# Patient Record
Sex: Female | Born: 1935 | Race: White | Hispanic: No | Smoking: Never smoker
Health system: Southern US, Community
[De-identification: ages and names within clinical notes are randomized; demographics above are authoritative.]

## PROBLEM LIST (undated history)

## (undated) DIAGNOSIS — Z9889 Other specified postprocedural states: Secondary | ICD-10-CM

## (undated) DIAGNOSIS — F32A Depression, unspecified: Secondary | ICD-10-CM

## (undated) DIAGNOSIS — K589 Irritable bowel syndrome without diarrhea: Secondary | ICD-10-CM

## (undated) DIAGNOSIS — F329 Major depressive disorder, single episode, unspecified: Secondary | ICD-10-CM

## (undated) DIAGNOSIS — Z8673 Personal history of transient ischemic attack (TIA), and cerebral infarction without residual deficits: Secondary | ICD-10-CM

## (undated) DIAGNOSIS — Q211 Atrial septal defect, unspecified: Secondary | ICD-10-CM

## (undated) DIAGNOSIS — K219 Gastro-esophageal reflux disease without esophagitis: Secondary | ICD-10-CM

## (undated) DIAGNOSIS — Z923 Personal history of irradiation: Secondary | ICD-10-CM

## (undated) DIAGNOSIS — C801 Malignant (primary) neoplasm, unspecified: Secondary | ICD-10-CM

## (undated) DIAGNOSIS — H409 Unspecified glaucoma: Secondary | ICD-10-CM

## (undated) DIAGNOSIS — R269 Unspecified abnormalities of gait and mobility: Secondary | ICD-10-CM

## (undated) DIAGNOSIS — I10 Essential (primary) hypertension: Secondary | ICD-10-CM

## (undated) DIAGNOSIS — G5603 Carpal tunnel syndrome, bilateral upper limbs: Secondary | ICD-10-CM

## (undated) HISTORY — DX: Atrial septal defect, unspecified: Q21.10

## (undated) HISTORY — DX: Unspecified abnormalities of gait and mobility: R26.9

## (undated) HISTORY — DX: Other specified postprocedural states: Z98.890

## (undated) HISTORY — DX: Carpal tunnel syndrome, bilateral upper limbs: G56.03

## (undated) HISTORY — DX: Unspecified glaucoma: H40.9

## (undated) HISTORY — PX: CATARACT EXTRACTION: SUR2

## (undated) HISTORY — DX: Personal history of transient ischemic attack (TIA), and cerebral infarction without residual deficits: Z86.73

## (undated) HISTORY — DX: Gastro-esophageal reflux disease without esophagitis: K21.9

## (undated) HISTORY — PX: ABDOMINAL HYSTERECTOMY: SUR658

## (undated) HISTORY — PX: TOTAL KNEE ARTHROPLASTY: SHX125

## (undated) HISTORY — DX: Irritable bowel syndrome, unspecified: K58.9

## (undated) HISTORY — PX: OTHER SURGICAL HISTORY: SHX169

## (undated) HISTORY — PX: BREAST BIOPSY: SHX20

## (undated) HISTORY — PX: TOTAL HIP ARTHROPLASTY: SHX124

## (undated) HISTORY — DX: Atrial septal defect: Q21.1

---

## 1994-07-09 DIAGNOSIS — Z923 Personal history of irradiation: Secondary | ICD-10-CM

## 1994-07-09 HISTORY — PX: BREAST LUMPECTOMY: SHX2

## 1994-07-09 HISTORY — DX: Personal history of irradiation: Z92.3

## 1999-11-13 ENCOUNTER — Encounter: Payer: Self-pay | Admitting: Oncology

## 1999-11-13 ENCOUNTER — Encounter: Admission: RE | Admit: 1999-11-13 | Discharge: 1999-11-13 | Payer: Self-pay | Admitting: Oncology

## 1999-11-28 ENCOUNTER — Emergency Department (HOSPITAL_COMMUNITY): Admission: EM | Admit: 1999-11-28 | Discharge: 1999-11-28 | Payer: Self-pay

## 2000-11-13 ENCOUNTER — Encounter: Payer: Self-pay | Admitting: General Surgery

## 2000-11-13 ENCOUNTER — Encounter: Admission: RE | Admit: 2000-11-13 | Discharge: 2000-11-13 | Payer: Self-pay | Admitting: General Surgery

## 2000-12-15 ENCOUNTER — Emergency Department (HOSPITAL_COMMUNITY): Admission: EM | Admit: 2000-12-15 | Discharge: 2000-12-15 | Payer: Self-pay | Admitting: *Deleted

## 2000-12-15 ENCOUNTER — Encounter: Payer: Self-pay | Admitting: *Deleted

## 2001-03-24 ENCOUNTER — Encounter: Payer: Self-pay | Admitting: Orthopedic Surgery

## 2001-03-24 ENCOUNTER — Encounter: Admission: RE | Admit: 2001-03-24 | Discharge: 2001-03-24 | Payer: Self-pay | Admitting: Orthopedic Surgery

## 2001-12-02 ENCOUNTER — Encounter: Admission: RE | Admit: 2001-12-02 | Discharge: 2001-12-02 | Payer: Self-pay | Admitting: Family Medicine

## 2001-12-02 ENCOUNTER — Encounter: Payer: Self-pay | Admitting: Family Medicine

## 2002-08-14 ENCOUNTER — Encounter: Payer: Self-pay | Admitting: Orthopedic Surgery

## 2002-08-17 ENCOUNTER — Inpatient Hospital Stay (HOSPITAL_COMMUNITY): Admission: RE | Admit: 2002-08-17 | Discharge: 2002-08-24 | Payer: Self-pay | Admitting: Neurology

## 2002-08-18 ENCOUNTER — Encounter: Payer: Self-pay | Admitting: Orthopedic Surgery

## 2002-08-19 ENCOUNTER — Encounter: Payer: Self-pay | Admitting: Cardiovascular Disease

## 2002-08-20 ENCOUNTER — Encounter: Payer: Self-pay | Admitting: Orthopedic Surgery

## 2002-08-24 ENCOUNTER — Inpatient Hospital Stay (HOSPITAL_COMMUNITY)
Admission: RE | Admit: 2002-08-24 | Discharge: 2002-08-29 | Payer: Self-pay | Admitting: Physical Medicine & Rehabilitation

## 2002-08-24 ENCOUNTER — Encounter (INDEPENDENT_AMBULATORY_CARE_PROVIDER_SITE_OTHER): Payer: Self-pay | Admitting: Cardiology

## 2002-10-14 ENCOUNTER — Ambulatory Visit (HOSPITAL_COMMUNITY): Admission: RE | Admit: 2002-10-14 | Discharge: 2002-10-14 | Payer: Self-pay | Admitting: Gastroenterology

## 2002-12-15 ENCOUNTER — Encounter: Payer: Self-pay | Admitting: Family Medicine

## 2002-12-15 ENCOUNTER — Encounter: Admission: RE | Admit: 2002-12-15 | Discharge: 2002-12-15 | Payer: Self-pay | Admitting: Family Medicine

## 2003-07-21 ENCOUNTER — Other Ambulatory Visit: Admission: RE | Admit: 2003-07-21 | Discharge: 2003-07-21 | Payer: Self-pay | Admitting: Obstetrics and Gynecology

## 2003-12-27 ENCOUNTER — Encounter: Admission: RE | Admit: 2003-12-27 | Discharge: 2003-12-27 | Payer: Self-pay | Admitting: Oncology

## 2005-01-02 ENCOUNTER — Ambulatory Visit (HOSPITAL_COMMUNITY): Admission: RE | Admit: 2005-01-02 | Discharge: 2005-01-02 | Payer: Self-pay | Admitting: Gastroenterology

## 2005-01-08 ENCOUNTER — Encounter: Admission: RE | Admit: 2005-01-08 | Discharge: 2005-01-08 | Payer: Self-pay | Admitting: General Surgery

## 2005-04-17 ENCOUNTER — Ambulatory Visit: Payer: Self-pay | Admitting: Oncology

## 2005-05-17 ENCOUNTER — Observation Stay (HOSPITAL_COMMUNITY): Admission: EM | Admit: 2005-05-17 | Discharge: 2005-05-18 | Payer: Self-pay | Admitting: Emergency Medicine

## 2005-07-16 ENCOUNTER — Encounter: Admission: RE | Admit: 2005-07-16 | Discharge: 2005-07-16 | Payer: Self-pay | Admitting: Oncology

## 2005-09-23 ENCOUNTER — Inpatient Hospital Stay (HOSPITAL_COMMUNITY): Admission: EM | Admit: 2005-09-23 | Discharge: 2005-09-25 | Payer: Self-pay | Admitting: Emergency Medicine

## 2005-10-06 ENCOUNTER — Inpatient Hospital Stay (HOSPITAL_COMMUNITY): Admission: RE | Admit: 2005-10-06 | Discharge: 2005-10-11 | Payer: Self-pay | Admitting: Orthopedic Surgery

## 2005-10-13 ENCOUNTER — Emergency Department (HOSPITAL_COMMUNITY): Admission: EM | Admit: 2005-10-13 | Discharge: 2005-10-13 | Payer: Self-pay | Admitting: Emergency Medicine

## 2006-01-11 ENCOUNTER — Encounter: Admission: RE | Admit: 2006-01-11 | Discharge: 2006-01-11 | Payer: Self-pay | Admitting: General Surgery

## 2006-04-15 ENCOUNTER — Ambulatory Visit: Payer: Self-pay | Admitting: Oncology

## 2006-04-17 LAB — URINALYSIS, MICROSCOPIC - CHCC
Bilirubin (Urine): NEGATIVE
Glucose: NEGATIVE g/dL
Ketones: NEGATIVE mg/dL
RBC count: NEGATIVE (ref 0–2)
pH: 6 (ref 4.6–8.0)

## 2007-01-14 ENCOUNTER — Encounter: Admission: RE | Admit: 2007-01-14 | Discharge: 2007-01-14 | Payer: Self-pay | Admitting: General Surgery

## 2007-01-17 ENCOUNTER — Encounter: Admission: RE | Admit: 2007-01-17 | Discharge: 2007-01-17 | Payer: Self-pay | Admitting: General Surgery

## 2007-04-11 ENCOUNTER — Ambulatory Visit: Payer: Self-pay | Admitting: Oncology

## 2007-07-09 ENCOUNTER — Ambulatory Visit: Payer: Self-pay | Admitting: Oncology

## 2008-01-30 ENCOUNTER — Encounter: Admission: RE | Admit: 2008-01-30 | Discharge: 2008-01-30 | Payer: Self-pay | Admitting: Family Medicine

## 2008-02-06 ENCOUNTER — Encounter: Admission: RE | Admit: 2008-02-06 | Discharge: 2008-02-06 | Payer: Self-pay | Admitting: Family Medicine

## 2008-04-20 ENCOUNTER — Encounter: Admission: RE | Admit: 2008-04-20 | Discharge: 2008-04-20 | Payer: Self-pay | Admitting: Family Medicine

## 2008-08-06 ENCOUNTER — Ambulatory Visit: Payer: Self-pay | Admitting: Oncology

## 2008-11-26 ENCOUNTER — Emergency Department (HOSPITAL_COMMUNITY): Admission: EM | Admit: 2008-11-26 | Discharge: 2008-11-26 | Payer: Self-pay | Admitting: Emergency Medicine

## 2008-12-20 ENCOUNTER — Inpatient Hospital Stay (HOSPITAL_COMMUNITY): Admission: EM | Admit: 2008-12-20 | Discharge: 2008-12-22 | Payer: Self-pay | Admitting: Emergency Medicine

## 2009-02-10 ENCOUNTER — Encounter: Admission: RE | Admit: 2009-02-10 | Discharge: 2009-02-10 | Payer: Self-pay | Admitting: Oncology

## 2009-08-12 ENCOUNTER — Ambulatory Visit: Payer: Self-pay | Admitting: Oncology

## 2009-08-16 LAB — CBC WITH DIFFERENTIAL/PLATELET
BASO%: 2.1 % — ABNORMAL HIGH (ref 0.0–2.0)
Eosinophils Absolute: 0.2 10*3/uL (ref 0.0–0.5)
HCT: 45.9 % (ref 34.8–46.6)
LYMPH%: 29.1 % (ref 14.0–49.7)
MCHC: 34 g/dL (ref 31.5–36.0)
MONO#: 0.3 10*3/uL (ref 0.1–0.9)
NEUT%: 60.4 % (ref 38.4–76.8)
Platelets: 224 10*3/uL (ref 145–400)
WBC: 6.3 10*3/uL (ref 3.9–10.3)

## 2009-08-16 LAB — COMPREHENSIVE METABOLIC PANEL
BUN: 16 mg/dL (ref 6–23)
CO2: 22 mEq/L (ref 19–32)
Creatinine, Ser: 0.92 mg/dL (ref 0.40–1.20)
Glucose, Bld: 101 mg/dL — ABNORMAL HIGH (ref 70–99)
Total Bilirubin: 0.8 mg/dL (ref 0.3–1.2)

## 2010-01-06 ENCOUNTER — Ambulatory Visit (HOSPITAL_BASED_OUTPATIENT_CLINIC_OR_DEPARTMENT_OTHER): Admission: RE | Admit: 2010-01-06 | Discharge: 2010-01-07 | Payer: Self-pay | Admitting: Urology

## 2010-02-13 ENCOUNTER — Encounter: Admission: RE | Admit: 2010-02-13 | Discharge: 2010-02-13 | Payer: Self-pay | Admitting: General Surgery

## 2010-07-30 ENCOUNTER — Encounter: Payer: Self-pay | Admitting: Urology

## 2010-07-31 ENCOUNTER — Encounter: Payer: Self-pay | Admitting: General Surgery

## 2010-07-31 ENCOUNTER — Encounter: Payer: Self-pay | Admitting: Family Medicine

## 2010-08-16 ENCOUNTER — Other Ambulatory Visit: Payer: Self-pay | Admitting: Oncology

## 2010-08-16 ENCOUNTER — Encounter (HOSPITAL_BASED_OUTPATIENT_CLINIC_OR_DEPARTMENT_OTHER): Payer: Medicare Other | Admitting: Oncology

## 2010-08-16 DIAGNOSIS — Z1231 Encounter for screening mammogram for malignant neoplasm of breast: Secondary | ICD-10-CM

## 2010-08-16 DIAGNOSIS — Z853 Personal history of malignant neoplasm of breast: Secondary | ICD-10-CM

## 2010-08-16 DIAGNOSIS — Z9889 Other specified postprocedural states: Secondary | ICD-10-CM

## 2010-08-16 LAB — CBC WITH DIFFERENTIAL/PLATELET
Eosinophils Absolute: 0.1 10*3/uL (ref 0.0–0.5)
HCT: 43.4 % (ref 34.8–46.6)
HGB: 14.7 g/dL (ref 11.6–15.9)
LYMPH%: 29.9 % (ref 14.0–49.7)
MONO#: 0.3 10*3/uL (ref 0.1–0.9)
NEUT#: 3.4 10*3/uL (ref 1.5–6.5)
Platelets: 197 10*3/uL (ref 145–400)
RBC: 4.93 10*6/uL (ref 3.70–5.45)
WBC: 5.6 10*3/uL (ref 3.9–10.3)

## 2010-09-24 LAB — BASIC METABOLIC PANEL
CO2: 24 mEq/L (ref 19–32)
Chloride: 112 mEq/L (ref 96–112)
GFR calc Af Amer: >60 mL/min (ref >60)
Potassium: 4.3 mEq/L (ref 3.5–5.1)
Sodium: 142 mEq/L (ref 135–145)

## 2010-09-24 LAB — PROTIME-INR: INR: 1.02 (ref 0.00–1.49)

## 2010-09-24 LAB — POCT HEMOGLOBIN-HEMACUE: Hemoglobin: 15.5 g/dL — ABNORMAL HIGH (ref 12.0–15.0)

## 2010-10-16 LAB — URINALYSIS, ROUTINE W REFLEX MICROSCOPIC
Nitrite: NEGATIVE
Specific Gravity, Urine: 1.025 (ref 1.005–1.030)
Urobilinogen, UA: 1 mg/dL (ref 0.0–1.0)
pH: 6 (ref 5.0–8.0)

## 2010-10-16 LAB — CULTURE, BLOOD (ROUTINE X 2): Culture: NO GROWTH

## 2010-10-16 LAB — DIFFERENTIAL
Basophils Absolute: 0.1 10*3/uL (ref 0.0–0.1)
Basophils Relative: 0 % (ref 0–1)
Eosinophils Relative: 1 % (ref 0–5)
Lymphocytes Relative: 12 % (ref 12–46)
Lymphs Abs: 1.2 10*3/uL (ref 0.7–4.0)
Monocytes Relative: 2 % — ABNORMAL LOW (ref 3–12)
Neutro Abs: 10.3 10*3/uL — ABNORMAL HIGH (ref 1.7–7.7)
Neutro Abs: 8 10*3/uL — ABNORMAL HIGH (ref 1.7–7.7)
Neutrophils Relative %: 80 % — ABNORMAL HIGH (ref 43–77)
Neutrophils Relative %: 88 % — ABNORMAL HIGH (ref 43–77)

## 2010-10-16 LAB — PROTIME-INR
INR: 1.9 — ABNORMAL HIGH (ref 0.00–1.49)
INR: 3.3 — ABNORMAL HIGH (ref 0.00–1.49)
INR: 4.2 — ABNORMAL HIGH (ref 0.00–1.49)
INR: 5.1 (ref 0.00–1.49)
Prothrombin Time: 35.9 seconds — ABNORMAL HIGH (ref 11.6–15.2)
Prothrombin Time: 44.5 seconds — ABNORMAL HIGH (ref 11.6–15.2)
Prothrombin Time: 52.3 seconds — ABNORMAL HIGH (ref 11.6–15.2)

## 2010-10-16 LAB — BASIC METABOLIC PANEL
BUN: 15 mg/dL (ref 6–23)
CO2: 24 mEq/L (ref 19–32)
Calcium: 8.3 mg/dL — ABNORMAL LOW (ref 8.4–10.5)
Creatinine, Ser: 0.88 mg/dL (ref 0.4–1.2)
GFR calc Af Amer: >60 mL/min (ref >60)

## 2010-10-16 LAB — URINE CULTURE

## 2010-10-16 LAB — CBC
HCT: 29.5 % — ABNORMAL LOW (ref 36.0–46.0)
MCHC: 33.7 g/dL (ref 30.0–36.0)
Platelets: 166 10*3/uL (ref 150–400)
Platelets: 209 10*3/uL (ref 150–400)
RBC: 3.55 MIL/uL — ABNORMAL LOW (ref 3.87–5.11)
WBC: 10.1 10*3/uL (ref 4.0–10.5)
WBC: 11.7 10*3/uL — ABNORMAL HIGH (ref 4.0–10.5)

## 2010-10-16 LAB — URINE MICROSCOPIC-ADD ON

## 2010-11-21 NOTE — H&P (Signed)
NAME:  Renee Adams, Renee Adams               ACCOUNT NO.:  [id number]   MEDICAL RECORD NO.:  [id number]          PATIENT TYPE:  OBV   LOCATION:  1857                         FACILITY:  [facility]   PHYSICIAN:  Kela Millin, M.D.DATE OF BIRTH:  February 12, 1934   DATE OF ADMISSION:  12/19/2008  DATE OF DISCHARGE:                              HISTORY & PHYSICAL   PRIMARY CARE PHYSICIAN:  L. Lupe Carney, M.D.   CHIEF COMPLAINT:  Worsening right thigh pain.   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  She states that she fell 3 days ago and the next day went  to Dr. Tobin Chad office and had some x-rays done and  was told that she  had no fractures.  She was advised to take Tylenol for pain as needed.  Since then she has had worsening pain in her right thigh mostly  posteriorly (she fell backwards on Thursday).  She has also noted  swelling in her upper thigh posteriorly.  She denies fevers, generalized  weakness, dizziness, shortness of breath, diarrhea, melena, and no  hematochezia.   She was seen in the ED and x-rays of that right leg showed right hip and  knee arthroplasties without fracture or other apparent complication.  Her INR was noted to be elevated at 5.1.  Her hemoglobin 10.6 with a  hematocrit of 31.5.  She is admitted for pain control and further  management.   The patient denies syncope, also no dizziness.   PAST MEDICAL HISTORY:  1. As above.  2. History of a left breast cancer in 1996.  3. History of left internal carotid artery stenosis, 60-80%.   MEDICATIONS:  1. Lipitor 10 mg daily.  2. Wellbutrin.  3. Coumadin.  4. Tylenol p.r.n.  5. Phenergan p.r.n.  6. Requip.  7. Xalatan.  8. Acular.   The patient does not know the doses of most of her medications.   ALLERGIES:  DILAUDID and MORPHINE slow down her respirations.  KEFLEX.   SOCIAL HISTORY:  She denies tobacco, also denies alcohol.   FAMILY HISTORY:  Reviewed and noncontributory to current illness.   REVIEW OF SYSTEMS:  As per HPI; other review of systems negative.   PHYSICAL EXAMINATION:  GENERAL:  The patient is a pleasant, elderly,  white female, in no apparent distress.  VITAL SIGNS:  Her temperature is 98.6 and a blood pressure 122/53, pulse  99, respiratory rate of 18, oxygen saturation of 93%.  HEENT:  PERRL, EOMI, moist mucous membranes and no oral exudates.  NECK:  Supple, no adenopathy, no thyromegaly, and no JVD.  LUNGS:  Clear to auscultation bilaterally.  No crackles or wheezes.  CARDIOVASCULAR:  Regular rate and rhythm.  Normal S1 and S2.  ABDOMEN:  Soft.  Bowel sounds present, nontender, nondistended.  No  organomegaly and no  masses palpable.  EXTREMITIES:  She has a large ecchymotic area from her right upper thigh  posteriorly, up toward her right buttock, edematous.  Her left lower  extremity within normal limits.  No cyanosis and no edema.  NEURO:  She is alert and oriented x3.  Cranial nerves II-XII grossly  intact, nonfocal exam.   LABORATORY DATA:  As per HPI and also her white cell count is 11.7,  hemoglobin 10.6, hematocrit 31.5.  INR is 5.1.  Sodium 136 with a  potassium of 4.1, chloride 107, CO2 of 24, glucose 117, BUN of 15,  creatinine 0.88, calcium 8.3.   ASSESSMENT AND PLAN:  1. Large right thigh hematoma/ecchymosis, status post fall with      supratherapeutic INR as above.  Will hold Coumadin.  Pain control.      Follow.  2. Supratherapeutic INR:  As discussed above.  The ED physician      discussed the patient with [facility] cardiology and they recommend for      her Coumadin to be restarted once her INR decreases.  Will monitor      INR and  further manage accordingly.  3. History of atrial septal defect with history of strokes after      surgery, on chronic Coumadin.  INR supratherapeutic.  Management as      above.  4. History of osteoarthritis of hips and knees, status post surgeries.  5. History of anxiety/depression.  Continue outpatient medications.  6. Mild leukocytosis.  Obtain a urinalysis and follow.  7. History of hypercholesterolemia.  Continue Lipitor.  8. Status post fall.  Consult physical therapy and occupational      therapy and follow.      Kela Millin, M.D.  Electronically Signed     Kela Millin, M.D.  Electronically Signed    ACV/MEDQ  D:  12/19/2008  T:  12/19/2008  Job:  [id number]   cc:   L. Lupe Carney, M.D.

## 2010-11-24 NOTE — Consult Note (Signed)
NAME:  Renee Adams, Renee Adams NO.:  [id number]   MEDICAL RECORD NO.:  [id number]          PATIENT TYPE:  INP   LOCATION:  5727                         FACILITY:  [facility]   PHYSICIAN:  Melvyn Novas, M.D.  DATE OF BIRTH:  [birth year]   DATE OF CONSULTATION:  09/25/2005  DATE OF DISCHARGE:  09/25/2005                                   CONSULTATION   [name] is a 77 year old Caucasian right-handed female who states that  she had suffered two strokes in the past, one in 1997 leaving her with a  very mild residual right-sided hand numbness and a second stroke in 2004  during which she suffered right hemiparesis from which she has recovered  greatly. Coumadin was started after the patient was found to have an ASD as  a risk factor. She states that she does not have atrial fibrillation and no  clotting disorders to her knowledge. The patient is followed for her  Coumadin levels by Dr. Meade Maw at [facility].   The patient recently fell and was admitted to Dr. Tobin Chad orthopedic  surgical service after a dislocation of the left hip. She was now treated  with a closed hip reduction and is currently using a walker and a hip brace.  The patient is expected to undergo a total hip replacement on the left  within the next 14 days and Dr. [name] wanted neurologic input to see how to  prevent any new strokes in the future since she will have to get off  Coumadin for the period of time.   The patient's review of systems is without any additional deficits. She  states she has no evidence of any infection. She has some hip pain and is  normally not using a walker as she is now. She denies any other neurologic  deficits, has never had recurrence of right-sided weakness or facial droop  since 2004. No respiratory ailments. No gastrointestinal or urinary  problems.   The patient's vital signs are stable. She is febrile. She is able to walk  with a walker after her closed hip  reduction. She is alert and oriented x3.  Shows good memory and fluent conversation. Cranial nerve examination shows a  mild right-sided facial droop seen in her nasal labial fold being decreased  on the right but her forehead moves equally. The patient also has no visual  field deficits and no facial sensory loss. She has full range of motion for  her neck. Tongue and uvula move in the midline. There is a slight asymmetry  as her  uvula does not lift as much on the right as on the left , but she  denies any problems with swallowing or speech.   Motor examination shows 4+/5 strength throughout the right side. There is a  mild decrease in her grip strength. There is a mild right-sided pronator  drift. Deep tendon reflexes are a little bit brisker right over left but  very subtly so. The left neck strength was impaired due to the hip brace and  the pain was related to the recent closed  reduction. She had no primary  modality loss to touch. Coordination and finger-nose test (we did not  perform heel-to-shin test). The patient denies any problems with  coordination and shows no ataxia or dysmetria on finger-nose.   Gait, again the patient is using a walker which is not her normal baseline.   PAST MEDICAL HISTORY:  CVA, ASD, depression, spastic colon and recent hip  dislocation.   MEDICATIONS:  Coumadin, Wellbutrin 150 milligrams q.a.m., Lipitor 10  milligrams a day, Prilosec p.r.n. The patient's dose of Coumadin was not  noted in the chart.   SOCIAL HISTORY:  The patient has adult healthy children. Denies smoking. No  alcohol or drug use. She is widowed.   PHYSICAL EXAMINATION:  Performed by the a primary service shows a general  normal exam. Supple neck. No goiter. No lymph node swelling. Lungs clear to  auscultation. Heart examination shows a regular rate and rhythm. A murmur  was not noted neither a carotid bruit. Extremities at the time of her  admission show left-sided leg  shortening.   ASSESSMENT:  This patient has multiple risk factors for stroke and has been  placed on Coumadin to prevent recurrence. Since she will need full hip  replacement surgery, I would recommend for this patient to be converted to  heparin IV while her Coumadin is held. I suggest that the Coumadin should be  held about five days prior to her surgery until return to a normal INR and  of course a vitamin K could also be used to shorten this period of time. I  would like her to be 48 or 72 hours prior to her surgery on IV heparin and  not on Lovenox which requires hospitalization. This way the heparin affect  can be quickly reversed and heparin can also be easily restarted after  surgery.      Melvyn Novas, M.D.  Electronically Signed     CD/MEDQ  D:  09/25/2005  T:  09/26/2005  Job:  [id number]   cc:   Meade Maw, M.D.  Fax: [phone]   Mila Homer. [name], M.D.  Fax: [phone]   L. Lupe Carney, M.D.  Fax: [phone]   Genene Churn. Love, M.D.  Fax: [phone]

## 2010-11-24 NOTE — Consult Note (Signed)
NAME:  Renee Adams, Renee Adams               ACCOUNT NO.:  [id number]   MEDICAL RECORD NO.:  [id number]          PATIENT TYPE:  INP   LOCATION:  1825                         FACILITY:  [facility]   PHYSICIAN:  Hollice Espy, M.D.DATE OF BIRTH:  [birth year]   DATE OF CONSULTATION:  DATE OF DISCHARGE:                                   CONSULTATION   PRIMARY CARE PHYSICIAN:  L. Lupe Carney, M.D.   REASON FOR CONSULTATION:  Evaluation of fever.   HISTORY OF PRESENT ILLNESS:  The patient is a 77 year old white female with  a past medical history of CVA, ASD, and recent hip dislocation, status post  revision of left total hip who was discharged from Dr. [name], orthopedic  surgery service, on October 11, 2005.  Since that time, she has been in [facility] receiving rehab, and started having issues with elevated  temperatures.  She had two elevated temperature spikes as high as 101 on two  separate days, and the patient, as per instructions by Dr. [name], was  brought into the emergency room for further evaluation.  On admission to the  emergency room, the patient was noted in the emergency room to have lower  grade temperatures of 100.5.  Blood pressures had been within normal limits,  although initial blood pressure reported a systolic of 68, with no  diastolic;  however, further blood pressures were within the normal range in  the 100s.  Laboratory work was done, including a chest x-ray, which showed  no acute cardiopulmonary disease, no evidence of any atelectasis, and a  completely normal urinalysis, and most notable was a normal white count with  normal neutrophils and absolute granulocyte count.  The patient had blood  cultures drawn.  The rest of her laboratory work is essentially  unremarkable.  Plan was by orthopedic surgery for a brief admission for this  patient for interventional radiology to do an aspiration of the left hip and  send fluid for cultures.  The patient herself is  doing okay.  Her only  complaint is some discomfort from having to lay on her back and having  feeling some pressure sore on her buttocks;  however, she actually does not  have any pressure sores.  In addition, her wound was examined, and no  evidence of pus, drainage, surrounding erythema was noted as well.  The  wound looks well-healed with staples still intact.  The patient otherwise is  doing well.  She denies any headaches, vision changes, dysphagia, chest  pain, palpitations, shortness of breath, wheeze, cough, abdominal pain,  hematuria, dysuria, constipation, diarrhea, focal extremity numbness or  weakness other than the afore mentioned left hip.  Review of systems  otherwise negative.   PAST MEDICAL HISTORY:  1.  History of ASD.  2.  History of CVA.  3.  History of recent revision of left hip secondary to dislocation.  4.  History of breast CA on the left side.   MEDICATIONS:  Based on the patient's previous discharge summary, are:  1.  Wellbutrin 150 mg p.o. daily.  2.  Zocor 20  mg p.o. q.h.s.  3.  Coumadin.  4.  Colace 100 mg p.o. b.i.d.  5.  OxyContin 10 mg SR p.o. q.12h.  6.  Requip 0.5 mg p.o. q.h.s. nightly.  7.  Percocet p.r.n.  8.  Ambien p.r.n.  9.  Tylenol p.r.n.  10. Phenergan p.r.n.  11. Senokot p.r.n.  12. Fleets p.r.n.   ALLERGIES:  The patient has allergies to KEFLEX, DILAUDID, and MORPHINE,  although she seems to tolerate other oral narcotics fine.   SOCIAL HISTORY:  The patient denies any tobacco, alcohol, or drug use.  Currently, she is at [facility] receiving rehab.   FAMILY HISTORY:  Breast CA, CAD, and CHF.   PHYSICAL EXAMINATION:  VITAL SIGNS:  Temperature 100.5, now it is down to  97.2.  Heart rate 99, now 88.  Blood pressure about 102/59, respirations 20,  O2 saturation 98% on room air.  GENERAL:  The patient is alert and oriented x3.  In no apparent distress.  HEENT:  Normocephalic, atraumatic.  Mucous membranes are moist.   HEART:  Regular rate and rhythm, S1 and S2.  LUNGS:  Clear to auscultation bilaterally.  ABDOMEN:  Soft, nontender, nondistended, positive bowel sounds.  EXTREMITIES:  No clubbing or cyanosis, trace pitting edema.  The incision on  her left hip looks like clean, dry, and intact with staples.   LABORATORY DATA:  Chest x-ray shows no acute cardiopulmonary disease.  Urinalysis negative.  White count 8.1, no shift.  H&H 9.8 and 28.4.  MCV of  85, platelet count of 269.  Blood cultures drawn and pending.  Orthopedic  surgery has ordered a PT, PTT, INR, and sedimentation rate.   ASSESSMENT AND PLAN:  Fever.  I neglected to mention also that the patient  had a Doppler done at [facility] which was negative for deep vein thrombosis,  although we are awaiting for a therapeutic INR.  If she has a negative deep  vein thrombosis, then I would suspect that the most likely reason for her  fever is still post-op.  Her surgery was done on October 08, 2005, and although  it has been five days, I would still at this time attribute it to that.  She  has no evidence of any wound infection.  No evidence of any other source of  infection given her normal white count and normal shift.  To prevent,  however, any type of future complications, I recommend the patient to be  discharged on incentive spirometer.  She received incentive spirometer  during her hospitalization;  however, she has not had one since she was over  at [facility].  In addition, she also has not yet been fully  ambulatory, spends most time on her back, and I would recommend frequent  turning of patient with pillows to prevent any kind of pressure sore ulcers.  Otherwise, would continue as per orthopedic surgery.   Thank you very much for allowing [place] to participate in the care of this  patient.  Please notify if we can be of any further assistance.      Hollice Espy, M.D.  Electronically Signed     SKK/MEDQ  D:  10/13/2005  T:   10/13/2005  Job:  [id number]   cc:   Jonny Ruiz L. Rendall, M.D.  Fax: [phone]   L. Lupe Carney, M.D.  Fax: [phone]

## 2010-11-24 NOTE — Op Note (Signed)
NAME:  Renee Adams, Renee Adams               ACCOUNT NO.:  [id number]   MEDICAL RECORD NO.:  [id number]          PATIENT TYPE:  OBV   LOCATION:  1506                         [facility]AC[facility]   PHYSICIAN:  John L. Rendall, M.D.  DATE OF BIRTH:  [birth year]   DATE OF PROCEDURE:  05/17/2005  DATE OF DISCHARGE:                                 OPERATIVE REPORT   PREOPERATIVE DIAGNOSIS:  Posterior dislocation, left total hip.   POSTOPERATIVE DIAGNOSIS:  Posterior dislocation, left total hip.   SURGICAL PROCEDURES:  Closed manipulation and reduction under anesthesia.   SURGEON:  John L. Rendall, M.D.   ANESTHESIA:  General.   PROCEDURE:  Under brief general anesthetic with muscle relaxation, the left  hip was brought into flexion. A pull straight upward was done and even with  this applied, the hip did not reduce. It was then brought into extension,  the leg was still 2 inches short. The pelvic strap was then attached across  the symphysis and manipulation was done again with internal rotation  although I did not feel it actually jumped back in place. During the next  wiggle, the hip did go back in place. The hip was brought out into  extension, leg lengths were equal. Then using C-arm fluoro, the hip was  flexed up to 45 degrees, abducted, internal and external rotated and was  stable through all these motion. A knee immobilizer was then applied. The  patient was transferred to her regular bed and brought to recovery. Total  procedure time less than 5 minutes.      John L. Rendall, M.D.  Electronically Signed     JLR/MEDQ  D:  05/17/2005  T:  05/18/2005  Job:  [id number]

## 2010-11-24 NOTE — Discharge Summary (Signed)
[name], [name]               ACCOUNT NO.:  [id number]   MEDICAL RECORD NO.:  [id number]          PATIENT TYPE:  INP   LOCATION:  5034                         FACILITY:  [facility]   PHYSICIAN:  Mila Homer. [name], M.D. DATE OF BIRTH:  Mar 04, 1934   DATE OF ADMISSION:  10/06/2005  DATE OF DISCHARGE:  10/11/2005                           DISCHARGE SUMMARY - REFERRING   ADMISSION DIAGNOSES:  1.  Recurring dislocation left total hip replacement.  2.  Atrial septal defect with history of strokes after surgery.  3.  Depression.  4.  Osteoarthritis bilateral hips and knees.  5.  History of breast cancer, left side.  6.  Chronic Coumadin for atrial septal defect with history of stroke after      surgery.   DISCHARGE DIAGNOSES:  1.  Status post revision left total hip.  2.  Urinary retention, treated and resolved.  3.  Mild hypotension, preoperative and postoperative asymptomatic.  4.  Acute blood loss anemia secondary to surgery without symptoms.  5.  History of atrial septal defect with history of stroke after surgery, on      chronic Coumadin.  6.  Depression.  7.  Osteoarthritis, bilateral hips and knees.  8.  History of breast cancer left side.   HISTORY OF PRESENT ILLNESS:  Renee Adams is a 77 year old white female who  has history of left total hip replacement done by Dr. Paul Dykes. Renee Adams, July  1997.  Since Dr. Fannie Adams has retired, Dr. [name] has assumed patient's orthopedic  care and has actually undergone a Adams replacement in the past by Dr. [name].  Left hip was doing well until November of 2006, when she was bending down  and bringing her legs up to put her socks on and her left hip dislocated.  Patient was brought to the ER and underwent a closed reduction in the OR.  Patient was treated with a Adams immobilizer, just monitoring her activity.  She did do well until September 23, 2005, when she was sitting and twisted and  dislocated her hip.  Patient now is able to dislocate and relocate  her hip  easily.  Patient was placed in abduction brace and now is being admitted for  revision of left hip replacement.  Due to patient's chronic Coumadin  therapy, she was admitted on October 06, 2005, two days prior to surgery to  begin IV heparin.   ALLERGIES:  1.  MARCAINE causes respiratory distress.  2.  DILAUDID causes respiratory distress and paranoia.  3.  KEFLEX causes nausea and diarrhea.   MEDICATIONS ON ADMISSION:  1.  Wellbutrin XL 150 mg one tablet p.o. q.a.m.  2.  Coumadin 2 mg one tablet q.a.m., last dose October 02, 2005.  3.  Lipitor 10 mg one tablet p.o. q.a.m.  4.  Requip 0.5 mg one tablet p.o. nightly p.r.n. leg cramps.   PROCEDURE:  Patient was taken to the operating room on October 08, 2005, by  Dr. Mila Homer. Lucey, assisted by Oris Drone Petrarca, P.A.-C.  Patient was  placed under general anesthesia and then underwent a left hip revision  arthroplasty  with poly exchange and new femoral head.  Patient tolerated the  procedure well and returned to recovery in good stable condition.   CONSULTATIONS:  The following consults were obtained and patient was  hospitalized.  1.  PT.  2.  OT.  3.  Neurology.  4.  Case management.   HOSPITAL COURSE:  On October 02, 2005, patient was admitted.  Prior to  upcoming left total hip revision, due to the fact she was on chronic  Coumadin, was placed on heparin.   Hospital day #2, patient doing well, no complaints.  No chest pain,  shortness of breath, calf pain.  Heparin was actually started at 5:30 on  October 08, 2005.  Patient was afebrile and vital signs stable.  In the a.m.  it was noted that patient did have some blood pressures with the automatic  blood pressure machine that were 87/54, pulse 85.  When checked manually,  blood pressure was 120/70.  Patient was asymptomatic.   On October 08, 2005, patient was taken to the operating room.  Revision of the  left hip was performed.  INR preoperatively was 1.1, PT was 14.7 and  heparin  level was 0.65.   On postoperative day #1, patient denied any chest pain, nausea, vomiting,  did have some nausea the evening after surgery.  This was resolved at  morning rounds.  Pain under control with p.o. medications.  Patient was  afebrile and vital signs stable.  H&H 11.4 and 34.  Patient with poor  urinary output.  IV fluids were increased to 100 mL per hour.  Patient was  checked on periodically throughout the day.  BUN was 11 and creatinine was  1.   On postoperative day #2, patient was sitting up in the bed at morning  rounds, stated that she was feeling much better.  Patient with still poor  appetite, pain under control.  Patient was afebrile.  Vital signs stable.  H&H 10.6 and 30.8.  PT was 21.1, INR 1.8.  Patient continued heparin.  I&Os  were 1724 in and 1150 out. Foley was discontinued.  Arrangements were made  for patient to go to [facility] in [place] in a.m.   On postoperative day #3, patient with no complaints, voiding well, out of  bed with PT yesterday afternoon, walking the hallway.  Pain under adequate  control.  Patient was afebrile.  Blood pressure on morning rounds 93/54,  patient was asymptomatic, heart rate was 100, respiratory rate 20, 95% on  room air.  H&H was 10.7 and 30.2, stable.  PT was 27.6, INR 2.6.   LABORATORY DATA:  Routine labs on admission.  CBC with white count 7400,  hemoglobin 13.6, hematocrit 39.9, platelets 256. Coags with PT 17.3, high;  INR 1.4, PTT 33.  Heparin level was 0.85 international units per mL, high.  Routine chemistries on admission, all values within normal limits.  Hepatic  enzymes on admission, AST was 32, ALT 16, ALP 89, total bilirubin was  elevated at 1.3.  Urinalysis on admission was negative.  Wound and tissue  cultures obtained on October 08, 2005, from left hip showed no growth.  Anaerobic cultures from left hip October 08, 2005, no growth.  X-rays:  Two views left hip October 08, 2005,  postoperatively showed  satisfactory left total hip replacement.  Two view chest x-ray on October 06, 2005, showed stable chest with bi-apical scarring, no acute findings.   EKG dated October 06, 2005, showed  normal sinus rhythm, heart rate 86 beats  per minute.  PR interval 176, PRT axis 57, __________ 3147.   MEDICATIONS ON FLOOR:  1.  Wellbutrin 150 mg p.o. daily.  2.  Zocor 20 mg p.o. at 1800 hours.  3.  Warfarin per pharmacy protocol.  INR on day of discharge was 2.6.  4.  Colace 100 mg p.o. b.i.d.  5.  OxyContin 10 mg SR p.o. q.12h. at 10 a.m. and 10 p.m.  6.  Requip 0.5 mg nightly p.r.n.  7.  Percocet 5/325 one to two tablets p.o. q.4h. p.r.n. pain.  8.  Ambien 5 to 10 mg p.o. nightly p.r.n. insomnia.  9.  Tylenol 325 to 650 mg p.o. q.4h. p.r.n.  10. Heparin protocol until Coumadin is therapeutic.  11. Phenergan 25 mg p.o. q.6h. p.r.n. nausea.  12. Phenergan 25 mg p.r. q.6h. p.r.n. nausea.  13. Senokot one tablet p.o. nightly p.r.n.  14. Fleets enema 133 mL p.r. daily p.r.n.   DISCHARGE INSTRUCTIONS:  Floor medications will be adjusted by skilled  nursing facility physician.  Patient does need a Coumadin check on October 12, 2005, and Coumadin needs to be adjusted.  Coumadin to be adjusted by  hospital pharmacy prior to discharge.   WOUND CARE:  Patient is to keep wound clean and dry.  Change dressing daily.  Call office at [phone] if any foul smelling drainage or temps greater than  101.5, pains not adequately controlled with pain medication. Patient may  shower after two days if no drainage from wound site.   DIET:  Regular diet.   WEIGHTBEARING STATUS:  Patient is weightbearing as tolerated with walker.  No precautions.  Hip was done from posterior approach.   FOLLOW UP:  Patient needs follow-up with Dr. [name] in the office 14 days  from surgery, date of surgery was October 08, 2005.  Please call office at [phone] for appointment.   SPECIAL INSTRUCTIONS:  Patient  will need PT and OT at skilled nursing  facility and then PT and OT at  home per [facility].      Richardean Canal, P.A.    ______________________________  Mila Homer. [name], M.D.    GC/MEDQ  D:  10/11/2005  T:  10/11/2005  Job:  [id number]

## 2010-11-24 NOTE — Op Note (Signed)
NAME:  Renee Adams, Renee Adams Kitchen                       ACCOUNT NO.:  [id number]   MEDICAL RECORD NO.:  [id number]                   PATIENT TYPE:  INP   LOCATION:                                       FACILITY:  [facility]   PHYSICIAN:  Mila Homer. [name], M.D.              DATE OF BIRTH:  1934/01/17   DATE OF PROCEDURE:  08/17/2002  DATE OF DISCHARGE:                                 OPERATIVE REPORT   PREOPERATIVE DIAGNOSIS:  Left knee osteoarthritis.   POSTOPERATIVE DIAGNOSIS:  Left knee osteoarthritis.   PROCEDURE:  Left total knee arthroplasty.   SURGEON:  Mila Homer. [name], M.D.   ASSISTANT:  Legrand Pitts. Duffy, P.A.   ANESTHESIA:  General anesthesia.   INDICATIONS FOR PROCEDURE:  The patient is an elderly white female who is  status post failure of conservative measures for osteoarthritis of the knee.  Informed consent was obtained.   DESCRIPTION OF PROCEDURE:  The patient was placed supine, administered  general anesthesia and Foley catheter placement.  The left lower extremity  was prepped and draped in the usual sterile fashion.  After a sterile prep  and drape, a midline incision was made with a #10 blade.  A new blade was  used to make a median parapatellar arthrotomy.  At this point, a complete  synovectomy was performed.  I then everted the patella measured at 20 mm  thick and used a 29 mm reamer to ream down to the 11 mm and then used the 29  template sterilized previously removed all excess bone.  I split the  template at trial or will be a  prosthesis at trial and prosthetic trial in  place had also measured 20 mm.  I removed the trial, left the knee cap  inverted and then developed a subperiosteal sleeve off the medial crest of  the tibia, elevating the MCL, placed the bent knee retractor neat to that  and then brought the leg into flexion.  I cut the ACL/PCL and then used the  extramedullary alignment so as to remove a cut surface of tibia articular  surface 2 mm off of the  lateral side.  At this point, I turned attention to  the femur.  I made an intramedullary drill hole.  I then placed the  intramedullary guide set on 4 degree valgus cut, tampered down to the distal  aspect of the femur, placed the distal femoral cutting guide in place and  cut distal femur.  At this point, I did the epicondylar axis.  The posterior  condylar angle measured 5 degrees.  I then used the sizer and sized to a  size D and then pinned to the 5 degree holes.  I then placed the 4-in-1  cutter in place, screwed it into place and made our anterior, posterior and  chamfer cuts.  At this point, I placed the laminar spreader in the lateral  compartment,  removed the medial meniscus, posterior condylar osteophytes,  ACL and PCL.  I then switched the laminar spreader to the medial  compartment, removed the lateral meniscus and posterior lateral condylar  osteophytes.  I then used a 10 mm spacer block and  had excellent flexion  extension gap balance.  I then used the scoop retractor and the femoral  finishing guide and finished the femur cutting the notch and drilling her  leg holes.  I then finished the tibial with size 4 tibial tray drill and  keel.  I then used a size 3 tibial trial, size D femoral trial, size 10 poly  insert trial and size 20 patella trial.  The patella tracked well, dropping  to angles to 130 degrees, stability was excellent.  I then removed the  prosthetic trials and irrigated copiously, cemented the tibia first, femur  second, snapped in the real polyethylene, related the knee, put it in  extension and cemented in the patella.  All excess cement was removed.  After the cement was hard, I let the tourniquet down.  I then irrigated  copiously and cauterized all bleeding vessels.  Tourniquet time was 51  minutes.  I then left the Hemovac deep in the arthrotomy and closed the  arthrotomy with interrupted #1 Vicryls, the soft tissues with interrupted 0  Vicryls,  subcuticular 2-0 Vicryl stitch and skin staples.  Dressed with  Adaptic, 4x4s, sterile Webril and TED stocking.   TOURNIQUET TIME:  Tourniquet time was 50 minutes.   COMPLICATIONS:  There were no complications.   DRAINS:  One Hemovac.   ESTIMATED BLOOD LOSS:  300 cc.                                               Mila Homer. [name], M.D.    SDL/MEDQ  D:  08/17/2002  T:  08/17/2002  Job:  [id number]

## 2010-11-24 NOTE — Op Note (Signed)
   NAME:  Renee Adams, Renee Adams                         ACCOUNT NO.:  [id number]   MEDICAL RECORD NO.:  [id number]                   PATIENT TYPE:  INP   LOCATION:  4727                                 FACILITY:  [facility]   PHYSICIAN:  Meade Maw, M.D.                 DATE OF BIRTH:  08/09/1933   DATE OF PROCEDURE:  DATE OF DISCHARGE:                                 OPERATIVE REPORT   INDICATIONS FOR PROCEDURE:  Evaluation for possible cardiac embolic source.   DESCRIPTION OF PROCEDURE:  After obtaining written informed consent the  patient was brought  to the cardiac endoscopy laboratory in a post  absorptive state. Preoperative sedation was achieved using Cetacaine spray  and viscous lidocaine. The patient was given Versed 4 mg IV and fentanyl 100  micrograms.   Despite multiple attempts I was unable to introduce the OmniPlane  transesophageal echocardiogram probe. Assistance was obtained from Dr. Boyd Kerbs and Dr. Lina Sar. They too were unable to pass the TEE probe.  Brief upper endoscopy by Dr. Lina Sar revealed esophagitis and distal  sphincter strictures.   It was elected to abort the procedure and proceed with external bubble  study. Bubble study was performed. This did reveal significant crossing of  the bubbles from the left side to the right side, suggesting either patent  foramen ovale or atrial septal defect. Further recommendations would be to  proceed with the MRI of the heart when able to do so. Should the patient  have ongoing esophagitis, consideration to GI consultation.   IMPRESSION:  Final impression is positive bubble study.   RECOMMENDATIONS:  Recommend MRI of the heart when able to do so to further  evaluate.                                               Meade Maw, M.D.    HP/MEDQ  D:  08/21/2002  T:  08/21/2002  Job:  [id number]

## 2010-11-24 NOTE — H&P (Signed)
NAME:  Renee Adams, Renee Adams                         ACCOUNT NO.:  [id number]   MEDICAL RECORD NO.:  [id number]                   PATIENT TYPE:  INP   LOCATION:  NA                                   FACILITY:  [facility]   PHYSICIAN:  Mila Homer. [name], M.D.              DATE OF BIRTH:  Dec 19, 1933   DATE OF ADMISSION:  08/17/2002  DATE OF DISCHARGE:                                HISTORY & PHYSICAL   CHIEF COMPLAINT:  Left knee pain.   HISTORY OF PRESENT ILLNESS:  The patient is a 77 year old white female with  a complaint of starting to noticing a deformity in her left knee about two  years ago.  She indicates that she started having pain over this last summer  with any type of walking and range of motion.  She also noted that she had  an altered gait due to the pain and the deformity in her knee.  The patient  describes the pain as a deep dull aching sensation with occasional sharp  stabbing pain directly within the knee with no radiation.  She does have  problems at night with pain waking her up.  She does have a crunching  sensation within the knee.  She denies any swelling and the knee  occasionally feels like it wants to lock up.  X-rays reveal a complete  collapse of the lateral compartment of her left knee.   ALLERGIES:  CEPHALEXIN causing severe diarrhea and vomiting.   CURRENT MEDICATIONS:  1. Celexa 20 mg p.o. daily.  2. Evista 60 mg p.o. daily.  3. Restoril 15 mg p.o. q.h.s. p.r.n.  4. Xanax 0.25 mg p.o. q.6h. p.r.n. anxiety.  5. Clonidine p.r.n. hot flashes.  6. Levsin p.r.n. spastic colon cramps.  7. Tylenol p.r.n.  8. Nexium p.r.n.   PREVIOUS MEDICAL HISTORY:  1. Depression.  2. Spastic colon.  3. Glaucoma.  4. History of breast cancer in 1996.  5. History of mini-stroke in 1997 affecting right upper extremity and short-     term memory.   The patient denies any diabetes, hypertension, thyroid disease, ulcers,  heart disease or respiratory problems.   PAST  SURGICAL HISTORY:  1. Cystocele and rectocele repair in 1999.  2. Hysterectomy in 1971.  3. Breast lumpectomy in 1996.  4. Right hip replacement in 1997.  5. Left hip replacement in 1997.  6. Right total knee arthroplasty in 1998.  7. Trabeculectomy in 1998.   The patient denies any complications with the above-mentioned surgical  procedures other than having a significant amount of nausea postop.   PAST SOCIAL HISTORY:  The patient is a 77 year old white female, healthy  appearing.  She is widowed.  She denies any history of smoking or alcohol  use.  She does have two grown children.  She lives in a [place] house, two  steps to the main entrance.  She is a retired Engineer, water.  FAMILY PHYSICIAN:  Dr. Elsworth Soho.   FAMILY MEDICAL HISTORY:  Mother is deceased of breast cancer.  Father is  alive, in poor medical condition, currently requiring significant health  care with a history of CHF, CVA and significant dementia.  The patient has  got one brother deceased as a child, two brothers alive in good medical  health, one sister alive with a history of cholecystectomy with resulting  sepsis.   REVIEW OF SYSTEMS:  Review of systems is positive for glasses at all times.  She does have shortness of breath with exertion but she has no related  cardiac symptoms.  She does have a history of chest pain in 2001.  She went  to the emergency room for evaluation.  It was worked up and found to be  related to her reflux.  She does have problems with urinary urgency, which  is related to the cystocele and rectocele repair.  Otherwise, all the review  of systems are negative.   PHYSICAL EXAMINATION:  VITALS:  Height is 5 feet 5 inches.  Weight is 148  pounds.  Pulse is 76 and regular.  Respirations are 14.  Blood pressure is  152/94.  GENERAL:  This is a healthy-appearing, well-developed white female who  ambulates with a slight left-sided limp.  She has obvious lower extremity   valgus deformity.  She is able to get on and off the exam table without any  difficulty.  HEENT:  Head was normocephalic, atraumatic, nontender over maxillary or  frontal sinuses.  Pupils equal, round and reactive, accommodating to light.  Extraocular movements intact.  Sclerae are not icteric.  Conjunctivae are  pink and moist.  External ears were without deformities, canals patent, TMs  pearly gray and intact.  Gross hearing is intact.  Nasal septum was midline.  Oral buccal mucosa was pink and moist without lesion.  Dentition was in good  repair.  Uvula was midline.  Patient was able to swallow without any  difficulty.  NECK:  Neck was supple.  No palpable lymphadenopathy.  Thyroid gland was  nontender.  She had good range of motion of her cervical spine without any  difficulty or tenderness.  She was nontender along the entire spinal column  to light percussion.  CHEST:  Lung sounds were clear and equal bilaterally.  No wheezes, rales,  rhonchi or rubs noted.  HEART:  Regular rate and rhythm.  S1 and S2 are auscultated.  No murmurs,  rubs, or gallops noted.  ABDOMEN:  Abdomen was round, soft and nontender.  Bowel sounds were  normoactive throughout.  She had no hepatosplenomegaly.  CVA was nontender  to percussion.  EXTREMITIES:  Upper extremities were symmetrically sized and shaped.  She  had good range of motion of her shoulders, elbows and wrists without any  difficulty.  Motor strength was 5/5.  Lower extremities:  Right and left  hips had lateral hip incisions that were well-healed.  She had full  extension and flexion up to 120 degrees with 15 degrees internal and  external rotation bilaterally without any discomfort.  Right knee had a well-  healed midline surgical incision.  She had full extension and flexion back  to about 115 degrees.  She had no significant instability, no sign of  erythema or discomfort.  Left knee was without any signs of erythema or ecchymosis.  It was  slightly swollen-appearing.  No palpable effusion.  She  was significantly tender along the lateral joint line,  minimally along the  medial joint line.  She had full extension and flexion back to 115 degrees.  She had about 5 to 10 degrees valgus-varus laxity, no anterior or posterior  drawer.  The calf was nontender.  The ankles were symmetrical with good  dorsiflexion and plantarflexion.  PERIPHERAL VASCULATURE:  Carotid pulses were 2+, no bruits; radial pulses  2+; dorsalis pedis and posterior tibial were 1+.  She had no lower extremity  edema, venous stasis or pigmentation changes.  NEUROLOGIC:  The patient was conscious, alert and appropriate, and held an  easy conversation with the examiner.  Cranial nerve II-XII were grossly  intact.  Deep tendon reflexes of the upper and lower extremities were  symmetrical from right to left and brisk.  The patient was grossly intact to  light touch sensation.  She has no gross neurologic defect at this time.  BREASTS, RECTAL AND GU:  Exams were deferred at this time.   IMPRESSION:  1. End-stage osteoarthritis, left knee, with 20 degree valgus deformity.  2. Depression.  3. Spastic colon.  4. Glaucoma.  5. History of breast cancer.  6. History of mini-stroke.   PLAN:  The patient will be admitted to [facility] on August 17, 2002 under the care of Dr. Mila Homer. Lucey.  The patient will undergo all  routine labs and tests prior to having a left total knee arthroplasty.      Jamelle Rushing, P.A.                      Mila Homer. [name], M.D.    RWK/MEDQ  D:  08/13/2002  T:  08/13/2002  Job:  [id number]

## 2010-11-24 NOTE — Op Note (Signed)
NAME:  Renee Adams, Renee Adams               ACCOUNT NO.:  [id number]   MEDICAL RECORD NO.:  [id number]          PATIENT TYPE:  INP   LOCATION:  5727                         FACILITY:  [facility]   PHYSICIAN:  Mila Homer. [name], M.D. DATE OF BIRTH:  1933-10-06   DATE OF PROCEDURE:  09/23/2005  DATE OF DISCHARGE:                                 OPERATIVE REPORT   PREOPERATIVE DIAGNOSIS:   POSTOPERATIVE DIAGNOSIS:   OPERATION PERFORMED:   SURGEON:  Mila Homer. [name], M.D.   ASSISTANT:  Legrand Pitts. Duffy, P.A.   ANESTHESIA:  General.   INDICATIONS FOR PROCEDURE:  The patient is a 77 year old who is now over 10  years out from a hip replacement done by Remi Deter A. Fannie Knee, M.D.  This is her  second dislocation inside of a year.  Dislocated just while taking off her  sock earlier today.  Informed consent was obtained.   DESCRIPTION OF PROCEDURE:  The patient was laid supine and administered  general anesthesia.  Standard reduction maneuver was performed.  C-arm x-  rays were performed to ensure that the hip was back into place.  She was  then placed in abduction brace.  She was awakened and taken to recovery room  in stable condition.           ______________________________  Mila Homer [name], M.D.     SDL/MEDQ  D:  09/23/2005  T:  09/24/2005  Job:  [id number]

## 2010-11-24 NOTE — H&P (Signed)
NAME:  Renee Adams, Renee Adams               ACCOUNT NO.:  [id number]   MEDICAL RECORD NO.:  [id number]          PATIENT TYPE:  INP   LOCATION:  NA                           FACILITY:  [facility]   PHYSICIAN:  Mila Homer. [name], M.D. DATE OF BIRTH:  April 22, 1934   DATE OF ADMISSION:  DATE OF DISCHARGE:                                HISTORY & PHYSICAL   CHIEF COMPLAINT:  Recurring dislocating left hip replacement.   HISTORY OF PRESENT ILLNESS:  A 77 year old white female patient presented to  Dr. [name] with a history of a left hip replacement done by Dr. Jeannie Adams in  July 1997.  Since Dr. Fannie Adams has retired, Dr. [name] has assumed her orthopedic  care and has actually done a Adams replacement on her in the past.  She was  doing fine with her left hip until November of 2006 when she was bending  down and then also bringing her legs up to put her socks on and her hip  dislocated.  She was brought to the ER and had to have a close reduction in  the OR.  That was treated with a Adams immobilizer and just monitoring her  activity and she did fine until September 23, 2005 when she was sitting on her  house and she twisted and the hip dislocated.  She has had no other injury  to the hip.  It now can dislocated and relocate fairly easily.  She  subsequently came to the emergency department and then in the OR had closed  reduction done by Dr. [name].  She was placed in an abduction brace and she  is now being admitted for revision of that left hip replacement.   ALLERGIES:  1.  MORPHINE causes respiratory depression.  2.  DILAUDID causes respiratory depression and paranoia hip.  3.  KEFLEX causes nausea and diarrhea.   CURRENT MEDICATIONS:  1.  Wellbutrin XL 150 mg 1 tablet p.o. q.a.m.  2.  Coumadin 2 mg 1 tablet p.o. q.a.m. last dose to be October 02, 2005.  3.  Lipitor 10 mg 1 tablet p.o. q.a.m.  4.  Requip 0.5 mg 1 tablet p.o. q.h.s. p.r.n. leg cramps.   PAST MEDICAL HISTORY:  1.  Atrial septal defect treated  by Dr. Meade Adams.  2.  History of strokes twice after hip replacement surgery in 1997 and also      Adams replacement surgery in 2004 due to the atrial septal defect.  She      is now on chronic Coumadin for that.  3.  Depression.  4.  History of breast cancer left side treated with lumpectomy.  5.  Osteoarthritis bilateral hips and knees.  6.  Glaucoma.   She denies any history of diabetes mellitus, hypertension, thyroid disease,  hiatal hernia, reflux, peptic ulcer disease, asthma, or any other chronic  medical condition other than noted previously.   PAST SURGICAL HISTORY:  1.  Partial hysterectomy 1971 by Dr. Lamar Adams.  2.  Left breast lumpectomy October 26, 1994 by Dr. Francina Adams.  3.  Left total hip arthroplasty January 25, 1996 by  Dr. Jeannie Adams.  4.  Right total hip arthroplasty April 26, 1996 by Dr. Jeannie Adams.  5.  Right total Adams arthroplasty August 27, 1996 by Dr. Jeannie Adams.  6.  Left total Adams arthroplasty February 2004 by Dr. Mila Homer.  [place].  7.  Surgery bilateral eyes for glaucoma 1998 at [facility].  8.  Cystocele and rectocele repair 1999 by Dr. Billy Adams.  9.  Closed reduction left total hip November 2006.  10. Closed reduction left total hip March 2007.   The only complication she has had from surgery is the stroke that she  suffered after the hip replacement in 1997 and the Adams replacement in 2004  due to her atrial septal defect which was undiagnosed at that time.  She did  have residual right-sided symptoms that have since resolved.   She denies any history of cigarette smoking, alcohol use or drug use.  She  is widowed and has two children.  She lives by herself in a one-story house  with two steps into the main entrance.  She is a retired Nature conservation officer.  Her medical doctor is Dr. Lupe Adams at [phone].   FAMILY HISTORY:  Mother died at age of 9 with breast cancer.  Father died  at the age of 72 with stroke, congestive heart failure, prostate  cancer.  She had one brother who died at age 64 due to an accident and she has 2  living brothers and 1 living sister.  Brothers are age 60 and 60. Sister is  42.  One brother has a history of arthritis and her sister had a ruptured  gallbladder.  Her son is 21 daughters 61 and they are both alive and well.   REVIEW OF SYSTEMS:  She does have a remote history of migraines.  Complains  of some tinnitus.  She does have some seasonal allergies.  She has had  difficulty swallowing and had an esophageal stricture that was dilated by  Dr. Sherin Quarry in the past.  She does complain of intermittent neck pain.  She  has a history of some tachycardia in the past.  She does have dyspnea on  exertion and urinary frequency and urgency.  She does wear glasses.  She  does not have a living will nor a power of attorney.  All other systems were  negative and noncontributory.   PHYSICAL EXAM:  GENERAL:  Well-developed, well-nourished, mildly overweight  white female in no acute distress.  Talks easily with examiner.  Walks with  a slight limp due to the abduction brace.  Accompanied by her son.  Height 5  feet 4-1/2 inches. Weight 155 pounds, BMI is 25.  Vital signs:  Temperature 98.8 degrees Fahrenheit, pulse 92, respirations 26  and BP 142/66.  HEENT:  Normocephalic, atraumatic without frontal or maxillary sinus  tenderness to palpation.  Conjunctivae pink.  Sclerae anicteric.  PERRLA.  EOMs intact.  No visible external ear deformities.  Hearing grossly intact.  Unable to visualize the tympanic membranes due to a large amount of dried  cerumen in both ear canals.  Nose and nasal septum midline.  Nasal mucosa is  pink and moist without exudates or polyps noted.  Buccal mucosa pink and  moist.  Dentition in good repair.  Pharynx without erythema or exudates.  Tongue and uvula midline.  Tongue without fasciculations and uvula rises  equally with phonation. NECK:  No visible masses or lesions noted.  Trachea  midline.  No palpable  lymphadenopathy  or thyromegaly.  Carotids +2 bilaterally without bruits.  Full range of motion, nontender to palpation along the cervical spine.  CARDIOVASCULAR:  Heart rate and rhythm regular.  S1-S2 present without rubs,  clicks or murmurs noted.  RESPIRATORY:  Respirations even and unlabored.  Breath sounds clear to  auscultation bilaterally without rales or wheezes noted.  ABDOMEN:  Bowel sounds present x4 quadrants.  Soft, nontender to palpation  without hepatosplenomegaly or CVA tenderness.  Nontender to palpation along  the vertebral column.  BREASTS/GU/RECTAL/PELVIC:  These exams deferred at this time.  MUSCULOSKELETAL:  No obvious deformities bilateral upper extremities with  full range of motion of these extremities without pain.  Radial pulses +2  bilaterally.  Good range of motion of her right hip, bilateral knees and  bilateral feet.  DP and PT pulses are +2 bilaterally.  No lower extremity  edema.  Abduction and braces in place on the left leg so exam is limited.  She only has about 45 degrees of forward flexion at this time.  No abduction  and no real internal-external rotation.  Leg is otherwise neurovascularly  intact.  NEUROLOGIC:  Alert and oriented x3.  Cranial nerves II-XII are grossly  intact.  Strength 5/5 bilateral upper and lower extremities.  Rapid  alternating movements intact.  Deep tendon reflexes 2+ bilateral upper and  lower extremities.  Sensation intact to light touch.   IMPRESSION:  1.  Recurring dislocated left total hip replacement.  2.  Atrial septal defect with history of strokes after surgery.  3.  Depression.  4.  Osteoarthritis bilateral hips and knees.  5.  History of breast cancer left side.   PLAN:  On her previous admission, a consultation was obtained by Dr.  Vickey Huger of neurology and she recommended stopping her Coumadin 5 days  before surgery and bringing her into the hospital two days before surgery  for IV  heparin.  That is why she is being admitted on Saturday the 31st  versus her day of surgery on the 2nd.  The patient will undergo all the  routine preoperative laboratory tests and studies prior to this procedure.  She will be admitted on Saturday the 31st and neurology has been notified of  her admission and consulted.  She will be seen by Dr. Fraser Din prior to her  admission.  If we have any medical issues while the patient is hospitalized,  we will consult [facility], Dr. Fraser Din and/or Dr. Vickey Huger.      Legrand Pitts Duffy, P.A.    ______________________________  Mila Homer. [name], M.D.    KED/MEDQ  D:  10/03/2005  T:  10/04/2005  Job:  [id number]

## 2010-11-24 NOTE — Consult Note (Signed)
NAME:  Renee Adams, Renee Adams                         ACCOUNT NO.:  [id number]   MEDICAL RECORD NO.:  [id number]                   PATIENT TYPE:  INP   LOCATION:  5019                                 FACILITY:  [facility]   PHYSICIAN:  Lilyan Punt. Sydnee Levans, M.D.             DATE OF BIRTH:  11-30-1933   DATE OF CONSULTATION:  DATE OF DISCHARGE:                                   CONSULTATION   REASON FOR CONSULTATION:  This is a 77 year old woman who is hospitalized  postoperative day No. 1 for a total knee replacement.  She experienced  altered level of consciousness today, hypersomnolence, possibly slurred  speech and right hand weakness earlier today.   PROBLEMS:  1. Acute POD.  2. 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  3. Respiratory depression, presently requiring O2 at 4 liters.  Respiratory     depression also required Narcan after several doses of morphine yesterday     and Narcan was given also today after Dilaudid PCA.  4. Possible adverse drug reaction to Percocet of itching earlier today.  5. Polypharmacy.  6. Postoperative blood loss.  Preop hemoglobin 13, postop hemoglobin 9.8.   CHRONIC MEDICAL PROBLEMS/PMH/PAST SURGICAL:  1. TIA in a postoperative state, 7/97 after right hip replacement, then     manifested as right upper extremity loss of strength and short-term     memory impairment was seen by Dr. Sandria Manly in followup.  2. Left breast carcinoma, history of lumpectomy and radiation therapy in     1996.  3. Glaucoma, right eye trabeculectomy 1998.  4. Other orthopedic surgery:  Right total hip, 7/97; left total hip, 10/97;     right total knee, [facility].  5. Cystocele and rectocele repair in 1999.  6. Gastroesophageal reflux disease.  7. Irritable bowel syndrome.  8. Depression, anxiety.  9. Arthritis.  10.      Preventive colonoscopy in 1988, sigmoidoscopy 9/00, mammography     1998, Pneumovax 10/99, tetanus 2/03.  Most recent OV with Dr. Clovis Riley     1/04.   HOME MEDICATIONS:   Celexa 20 mg daily, Evista 60 mg daily, __________ 50 mg  q.h.s., Xanax 0.25 mg q.6 p.r.n., clonidine 0.01 mg p.r.n. hot flashes,  Levsin 0.125 p.r.n., Nexium 40 mg p.r.n.Marland Kitchen   PRESENT MEDICATIONS:  Xanax 0.25 mg q.8, Vicodin 5/500 1 q.4, Tylenol 325 mg  q.5, Senokot, Robaxin, Zestril 15 mg q.h.s., Reglan 10 mg IV q.8, Levsin  0.125 mg b.i.d. p.r.n., Protonix 40 mg p.o. daily, Lovenox 30 mg q.12 subcu,  Colace no dose given, Celexa 20 mg daily, Celebrex 20 mg b.i.d..  Other  medications given one time, subsequently discontinued:  Zofran, Reglan,  Percocet, Narcan x2 doses, Compazine, Benadryl, morphine, Cleocin.   ALLERGIES:  Keflex.   REVIEW OF SYSTEMS:  Patient is alert, awake, accompanied by her son.  Together they give history.  She has no complaints of chest pain, SOB.  She  expresses pain in the lower extremity and in the femoral area, apparently,  the site of an attempted femoral nerve blockade.  Her present level of  control is tolerable.  Her son notes no unusual appearance such as facial  droop, slurred speech although he is concerned of earlier today when  apparently she became weak during transfer from bed to a bedside chair.  She  has no abdominal complaints.  She has attempted to eat, has experienced  nausea and night flatus.  Presently denies rash or itch and looks  comfortable and states that she is comfortable, breathing supplemental  oxygen at 3 liters.   PHYSICAL EXAMINATION:  VITAL SIGNS:  Blood pressure 130/70, pulse rate 90,  regular.  Oximetry 95%, 4 liters.  Respiratory rate 12.  SKIN:  Color did perfuse.  Brisk capillary refill, no pallor.  EXTREMITIES:  Lower extremities are dressed in compression devices and the  knee in an immobilizer.  There is no edema.  Left femoral area is  unremarkable.  Pulses:  Femoral and DP are intact.  NECK:  Carotid upstroke brisk.  LUNGS:  CPA, however, diminished breath sounds in the bases, no rales or  rhonchi.  CARDIAC:   Regular S1, S2 and within normal limits.  HEENT:  Pharynx is patent, tongue points in midline.  No facial droop.  Nares are clear.  Eyes:  PERLA, EOMI.  Fundi could not be visualized.  Ears:  Within normal limits.  NECK:  Supple, there is no stiffness.   LABORATORY DATA:  CBC 9.8, from 0608, this a.m.  Glucose 139, calcium 8.1  but was otherwise, within normal limits.  EKG:  Reveals sinus rhythm and  appears as early LAHB, no acute changes when compared dated 9/03.   DISCUSSION:  I appreciate the opportunity to assess this nice woman with  reported level of mental status and concern of possible cerebrovascular  accident.  The patient presently is awake and tests to some visible  improvement over the past 90 minutes.  Her main concern is whether this is  another stroke or transient ischemic attack and I told her that at present,  at the worse, it appears that it could have been a transient ischemic attack  or medication side effect given problems demonstrated with narcotic  analgesia.  Exam is quite reassuring that this point.  I reviewed her op  note and anesthesia records which have not suggested unusual loss of  intravascular volume or any hemodynamic problems and respiratory depression  is clearly improved after the administration of Narcan which is appropriate  at this time and which may need to be repeated should circulating  metabolites confer additional respiratory depression affect.  Note that  hemoglobin has dropped 3.5 grams and would be appropriate to be checked out  now as well as the following actions done:  Neuro checks q.2h., repeat CBC,  obtain stat head CT scan to rule out acute intercranial changes revealed on  this may be low.  If this continued concern is to see a neurologist in the  past, have told family to ask Dr. Sandria Manly for his opinion should findings  warrant that.  The most important item will be to monitor respiratory status, continuing  oxygen a.m., working  days pulmonary toilette and to limit the use of  medications that can impair respirations.  She has appropriately received  Lovenox and appropriate intermittent compression devices putting acute PE  more on list of concerns, unless already suspect that  possibility.  Greatest  suspicion of sedating medications and polypharmacy, recent anesthesia plus  IV fluid at source  of her respiratory status changes which in turn have affected systemic  oxygenation and perhaps cerebral perfusion.  Will remain watchful for any  untoward changes.   Thank you for this opportunity to follow along.                                                  Lilyan Punt Sydnee Levans, M.D.    KCS/MEDQ  D:  08/18/2002  T:  08/18/2002  Job:  [id number]   cc:   L. Lupe Carney, M.D.  [address] [place]  [place] [address]  Fax: [phone]

## 2010-11-24 NOTE — Op Note (Signed)
   NAME:  Renee Adams, Renee Adams                         ACCOUNT NO.:  [id number]   MEDICAL RECORD NO.:  [id number]                   PATIENT TYPE:  AMB   LOCATION:  ENDO                                 FACILITY:  [facility]   PHYSICIAN:  Meade Maw, M.D.                 DATE OF BIRTH:  May 15, 1934   DATE OF PROCEDURE:  10/14/2002  DATE OF DISCHARGE:                                 OPERATIVE REPORT   INDICATIONS FOR PROCEDURE:  Evaluation for possible atrial septal defect.   PROCEDURE:  After obtaining written, informed consent, the patient was  brought to the endoscopy lab in the post absorptive state.  Preop sedation  was provided by Dr. Newell Coral.  The patient had an initial esophageal  dilatation by Dr. Barnett Abu.  The TEE probe was then placed by Dr. Barnett Abu.  Multiple views were then obtained at the midesophageal, basal, and deep  gastric view.  Color flow Doppler was performed across the anterior atrial  septum.  Bubble study was performed.  Color flow Doppler was performed  across the mitral, tricuspid, and aortic valve.  The left atrial appendage  was then visualized.  The ascending aorta, descending aorta was visualized.   FINDINGS:  The intra-atrial septum was intact but there was significant  bowing with respiration.  There was positive bubble study and demonstration  of color flow Doppler across the patent foramen ovale.  The left atrial  appendage was small.  There was no identifiable thrombus.  The mitral valve  was normal in morphology.  There was trivial mitral regurgitation.  There  was normal aortic valve morphology, normal tricuspid valve morphology, mild  tricuspid regurgitation noted, normal LV dimension with preserved systolic  function.  The ascending aorta, descending aorta was difficult to visualize.  There was no obvious atheromatous disease.   FINAL IMPRESSION:  1. Patent foramen ovale with demonstration of bubble study and significant     bowing of the  intra-atrial septum.  2. Normal LV dimension, normal systolic function.  3. Small left atrial appendage.                                               Meade Maw, M.D.    HP/MEDQ  D:  10/14/2002  T:  10/14/2002  Job:  [id number]   cc:   Deanna Artis. Sharene Skeans, M.D.  [address]  [address]  [place]  [place] [address]  Fax: [phone]   L. Lupe Carney, M.D.  [address] [place]  [place] [address]  Fax: [phone]

## 2010-11-24 NOTE — Op Note (Signed)
[name], Renee Adams               ACCOUNT NO.:  [id number]   MEDICAL RECORD NO.:  [id number]          PATIENT TYPE:  INP   LOCATION:  5034                         FACILITY:  [facility]   PHYSICIAN:  Mila Homer. [name], M.D. DATE OF BIRTH:  1933/11/18   DATE OF PROCEDURE:  10/08/2005  DATE OF DISCHARGE:                                 OPERATIVE REPORT   PREOPERATIVE DIAGNOSIS:  Left total hip arthroplasty instability.   POSTOPERATIVE DIAGNOSIS:  Left total hip arthroplasty instability.   OPERATION PERFORMED:  Left revision total hip arthroplasty with polyethylene  exchange and new femoral head.   SURGEON:  Mila Homer. [name], M.D.   ASSISTANTArlys John D. Petrarca, P.A.-C.   ANESTHESIA:  General.   INDICATIONS FOR PROCEDURE:  The patient is a 77 year old with two  dislocation occurrences, now 10 years after a primary total hip arthroplasty  by Dr. Trellis Paganini.  Informed consent was obtained for revision arthroplasty.   DESCRIPTION OF PROCEDURE:  The patient was laid supine and administered  general anesthesia.  Then placed on the right down, left up lateral  decubitus position.  The patient was then prepped and draped in the usual  sterile fashion.  A #10 blade was used to make a curvilinear incision.  I  only used about half of the previous incision.  I then went down through the  fascia, held it in place with a Charnley retractor.  I then went posteriorly  and tagged the short external rotators.  There was lots of polyethylene  synovitis in the hip.  I did an excessive amount of synovectomy.  I then  dislocated the hip and removed the ball.  At this point I put the neck  anterior to the acetabulum and placed Homan retractors anteriorly and  posteriorly.  I cleaned out so I could see the rim circumferentially.  Then  removed the polyethylene liner by drilling a hold adn putting in a 6.5 screw  and popped that out easily.  I then placed in the real tripolar Osteonic  block and liner and then  followed that with a +10 22 mm head and located the  hip.  Snapped into place, had excellent stability obviously.  I then  irrigated and closed the short external rotators through drill holes in the  trochanter.  Closed the fascia lata with #1 figure-of-eight Vicryl sutures,  the deep soft tissues with interrupted 0 Vicryl sutures, subcuticular 2-0  Vicryl stitch and skin staples.  Dressed with Adaptic, 4 x 4s, ABDs and  Ioban drape.   COMPLICATIONS:  None.   DRAINS:  None.   ESTIMATED BLOOD LOSS:  300 mL.           ______________________________  Mila Homer. [name], M.D.     SDL/MEDQ  D:  10/08/2005  T:  10/10/2005  Job:  [id number]

## 2010-11-24 NOTE — Consult Note (Signed)
NAME:  Renee Adams, Renee Adams                         ACCOUNT NO.:  [id number]   MEDICAL RECORD NO.:  [id number]                   PATIENT TYPE:  INP   LOCATION:  5022                                 FACILITY:  [facility]   PHYSICIAN:  Meade Maw, M.D.                 DATE OF BIRTH:  [birth year]   DATE OF CONSULTATION:  08/24/2002  DATE OF DISCHARGE:                                   CONSULTATION   INDICATION FOR CONSULTATION:  CVA, possible ASD.   HISTORY:  The patient is a very pleasant 77 year old female with past  medical history of osteoporosis and osteoarthritis.  She was admitted  electively on 08/17/2002 for a left total knee replacement.  Her past medical  history is also significant for a prior hip surgery.  Following this hip  surgery, she also developed a CVA seven years prior.  The patient reports  that she had a normal transesophageal echo performed at that time.  On her  first postoperative day following her total knee replacement, she suffered a  bicerebral stroke.  MRI of the brain was performed and revealed chronic  small vessel disease and old left parietal stroke.  There was a 2-cm acute  stroke in the right temporoparietal region and a 1 x 3-cm stroke in the left  parietal region.  The findings were most consistent with embolic infarct.  Transthoracic echo was then performed.  The transthoracic echo was  suboptimal.  It did reveal the presence of elevated pulmonary pressures, and  CT scan of the chest was ordered to evaluate for a possible pulmonary  emboli.  She was demonstrated to have no acute abnormalities.  There was no  pulmonary emboli noted.  Transthoracic echo otherwise revealed normal  systolic function.  The right atrium and right ventricle appeared to be  within normal dimension.  She was noted to have a rapid ventricular systolic  pressure of 45 mmHg.  I was initially consulted to perform TEE to evaluate  for a possible cardiac embolic source.  I was unable to  pass the TEE probe  despite the assistance of two GI persons as well.  Limited upper endoscopy  was performed by Dr. Lina Sar.  This revealed esophagitis and distal  stricture.   PAST MEDICAL HISTORY:  1. Osteoarthritis, as described above.  2. Depression.  3. Irritable bowel syndrome.  4. Glaucoma.  5. History of breast cancer in 1996.  6. GERD.   PAST SURGICAL HISTORY:  1. Cystocele/rectocele repair in 1999.  2. Right total knee arthroplasty in 1998.  3. Right hip replacement in 1997.  4. Left hip replacement in 1997.  5. Breast biopsy and lumpectomy in 1996.  6. Hysterectomy in 1971.   ALLERGIES:  PERCOCET, intolerance to MORPHINE.  KEFLEX results in nausea and  diarrhea.   CURRENT MEDICATIONS:  1. Celexa 20 mg daily.  2. Protonix 40 mg daily.  3.  Colace 100 mg b.i.d.  4. Lovenox 30 mg subcutaneously q.12h.  5. Celebrex 200 mg p.o. b.i.d.  6. Aspirin 325 mg daily.  7. K-Dur 20 mEq daily.  8. Coumadin adjusted to maintain an INR of 2-3.   REVIEW OF SYSTEMS:  Significant for nausea.  No significant history of  dyspnea.  Her exercise was limited by her arthritic pain.  She has had no  chest pain.  She denies palpitations and tachyarrhythmias.   FAMILY HISTORY:  Father is alive.  He had a stroke recently in his 80s and  90s.  Mother passed from breast cancer.  Father also suffers from dementia.  She has two brothers who are in good health.   SOCIAL HISTORY:  She is 77 years old.  She has been widowed for six years.  She has had no use of alcohol or tobacco.  She lives by herself.  She is a  retired [employer] Services operational officer.  She has two children who are grown.   PHYSICAL EXAMINATION:  VITAL SIGNS:  Blood pressure 114/63, heart rate 82,  O2 saturation 93% on room air, afebrile.  Telemetry reveals sinus rhythm  without significant ectopy.  GENERAL:  She is well-nourished, alert, pleasant.  Orlene Erm is  appropriate.  HEENT:  Unremarkable.  NECK:  She has good carotid  upstrokes.  There are no carotid bruits noted.  PULMONARY:  Breath sounds are equal and clear to auscultation and use of  accessory muscles.  CARDIOVASCULAR:  PMI within normal limits.  There is a quiet 1-2 systolic  murmur noted as well as a 1-2 diastolic murmur noted.  ABDOMEN:  Soft, benign, nontender.  No unusual bruits or pulsations noted.  EXTREMITIES:  Distal pulses were are intact.  There is no pedal edema.  NEUROLOGIC:  Grossly normal.   LABORATORY DATA:  CT scan of the chest, MRI of the head are as noted above.  White count 6.4, hemoglobin 10, hematocrit 29.4, platelet count 246.  INR  2.7.  Her electrolytes are within normal limits.  Creatinine 0.8.  LDL 77.  Her ECG reveals a sinus rhythm.  There is no evidence of right bundle branch  block.  There is a left anterior hemiblock noted.   IMPRESSION AND PLAN:  34. A 77 year old female with multiple embolic cerebral strokes with multiple     studies suggesting possible patent foramen atrial septal defect.     Recommend to proceed with magnetic resonance imaging of the heart for     better analysis when the patient is able to proceed.  There is no     evidence of right ventricular enlargement by her electrocardiogram;     however, she does have elevated right ventricular systolic pressure     suggesting possible atrial septal defect/patent foramen ovale.  She does     need to continue with Coumadin to maintain an international normalized     ratio of 2-3.  If her magnetic resonance imaging substantiates the     diagnosis of atrial septal defect/patent foramen ovale, she will be     referred for further evaluation and treatment of this issue.  2. Esophageal spasm and esophageal stricture.  Should the patient continue     to have symptoms of esophagitis, a gastrointestinal consultation should     be considered.  She may proceed with an outpatient workup once she has    completed her physical therapy.  Meade Maw, M.D.    HP/MEDQ  D:  08/24/2002  T:  08/24/2002  Job:  [id number]   cc:   Mila Homer. [name], M.D.  [address] [place]  [place] [address]  Fax: [phone]   L. Lupe Carney, M.D.  [address] [place]  [place] [address]  Fax: [phone]   Deanna Artis. Sharene Skeans, M.D.  [address]  [address]  [place]  [place] [address]  Fax: [phone]

## 2010-11-24 NOTE — Discharge Summary (Signed)
NAME:  Renee Adams, Renee Adams                         ACCOUNT NO.:  [id number]   MEDICAL RECORD NO.:  [id number]                   PATIENT TYPE:  IPS   LOCATION:  4144                                 FACILITY:  [facility]   PHYSICIAN:  Erick Colace, M.D.           DATE OF BIRTH:  03/05/1934   DATE OF ADMISSION:  08/24/2002  DATE OF DISCHARGE:                                 DISCHARGE SUMMARY   DISCHARGE DIAGNOSES:  1. Left total knee arthroplasty secondary to degenerative joint disease,     08/17/02.  2. Postoperative acute-subacute left posterior frontal infarction.  3. Left internal carotid artery stenosis of 60-80%.  4. Embolic atrioseptal defect with patent foramen ovale (PFO).  5. History of left brain infarction seven years ago.  6. Anemia.  7. Depression anxiety.  8. Spastic colon.  9. History of left breast cancer in 1996.  10.      Bilateral total hip replacements.  11.      Right total knee replacement in 1998.   HISTORY OF PRESENT ILLNESS:  This is a 77 year old female with history of  left brain CVA-TIA seven years ago who was admitted 08/17/02 with end-stage  degenerative joint disease of the left knee and no relief with conservative  care.  She underwent a left total knee replacement 08/17/02 per Dr. [name].  Placed on subcutaneous Lovenox for deep vein thrombosis prophylaxis and  weightbearing as tolerated.  Postoperative day #1 with altered mental  status, hypersomnolence, slurred speech, right arm weakness.  Her PCA pump  was discontinued.  Neurology service consult to Dr. Sharene Skeans.  Cranial CT  scan 08/18/02 showed acute-subacute left posterior frontal infarction that  involved the central artery.  Placed on aspirin 08/19/02 for CVA coverage.  MRI contraindicated until staples removed.  Carotid duplex study with left  ICA stenosis of 60-80%.  Echocardiogram with 65-75% ejection fraction and  normal left ventricular function.  Cardiology services for TEE to rule out  cardioembolic source of infarction 1/61/09 attempted, but unable to pass  probe.  A brief endoscopy per gastroenterology services revealed  esophagitis, questionable distal stricture.  Later a bubble study was  performed suggesting ASD-PFO.  She was changed from Lovenox to intravenous  heparin/Coumadin for chronic anticoagulation.  Hospital course anemia.  She  was transfused.  She was recovering well from her cerebrovascular accident.  She was minimal assist for her ambulation and admitted for a comprehensive  rehab program.  Latest INR of 2.7.  Duplex of the lower extremities on  08/20/02 was negative.   PAST MEDICAL HISTORY:  See discharged diagnoses.   PAST SURGICAL HISTORY:  1. Bilateral total hip replacement in 1997.  2. Right total knee replacement in 1998.  3. Hysterectomy.  4. Breast lumpectomy.  5. Cystocele repair.  6. Trabeculectomy in 1998.   ALLERGIES:  KEFLEX.   Her primary M.D. is Dr. Lupe Carney.   SOCIAL HISTORY:  Denies alcohol  or tobacco.  Widowed.  Independent prior to  admission.  Retired Engineer, water.  One level home; two steps to  entry.  Her children can assist.   MEDICATIONS PRIOR TO ADMISSION:  1. Celexa 20 mg daily.  2. Evista 60 mg daily.  3. Restoril as needed.  4. Xanax as needed.  5. Levsin as needed.  6. Nexium as needed.   HOSPITAL COURSE:  The patient did well on rehabilitation services with  therapies initiated on a b.i.d. basis.  The following issues were followed  during the patient's rehab course.  Pertaining to Ms. Shonk's left total  knee replacement remaining stable, surgical site healing nicely.  Staples  have been removed.  No signs of infection.  She has weightbearing as  tolerated with knee precautions.  Neurovascular sensation remained intact.  She received follow up for neurology services for postoperative right  temporal occipital infarction.  She was now on chronic anticoagulation.  Carotid duplex studies had shown  left internal carotid artery stenosis of 60-  80%.  She would receive follow up carotid studies in three months after  discharge.  She had a positive bubble study suggesting ASD-PFO.  She would  remain on chronic Coumadin and followed by Dr. Meade Maw of cardiology  services.  She had no chest pain or shortness of breath during her rehab  course.  Postoperative anemia stable after transfusion with hemoglobin 11,  hematocrit 32.2.  She had a history of spastic colon which was without issue  during her rehab stay.  Her mobility continued to improve after left total  knee replacement, as well as history of bilateral total hip replacements and  a right total knee replacement in 1998.  She was ambulating close  supervision, navigating stairs and car transfers, again with supervision.  She will be discharged to home on 08/29/02 with home health therapies.   LABORATORY DATA:  The latest labs showed an INR of 3.2, hemoglobin 11,  hematocrit 32.2, sodium 134, potassium 3.8, BUN 19, creatinine 0.8.   DISCHARGE MEDICATIONS:  1. Coumadin with latest dose of 2 mg at time of dictation.  2. Aspirin 81 mg daily.  3. Celexa 20 mg daily.  4. Protonix 40 mg daily.  5. Xanax as needed.  6. Restoril 15 mg as needed for sleep.  7. Vicodin as needed for pain.   ACTIVITY:  Weightbearing as tolerated with knee precautions.   DIET:  Regular.   WOUND CARE:  Cleanse incision daily with warm soap and water.   SPECIAL INSTRUCTIONS:  1. Home health nurse for prothrombin time on Monday, 08/31/02.  Results to     Dr. Meade Maw of Cardiology Services.  2. Follow up with Dr. [name], orthopedic service, as advised.  3. Dr. Lupe Carney, medical management.     Mariam Dollar, P.A.                     Erick Colace, M.D.    DA/MEDQ  D:  08/28/2002  T:  08/28/2002  Job:  [id number]   cc:   Erick Colace, M.D.  [address] [place], [place] [address]  Fax: [phone]   Mila Homer. [name], M.D.   [address] [place]  [place] [address]  Fax: [phone]   Meade Maw, M.D.  [address]  [place]  [place] [address]  Fax: [phone]   L. Lupe Carney, M.D.  [address] [place]  [place] [address]  Fax: [phone]   Genene Churn. Love, M.D.  [address]  [address]  [place]  [place] [address]  Fax: [phone]

## 2010-11-24 NOTE — Consult Note (Signed)
NAME:  Renee Adams, Renee Adams                         ACCOUNT NO.:  [id number]   MEDICAL RECORD NO.:  [id number]                   PATIENT TYPE:  INP   LOCATION:  5019                                 FACILITY:  [facility]   PHYSICIAN:  Deanna Artis. Sharene Skeans, M.D.           DATE OF BIRTH:  05-Jul-1934   DATE OF CONSULTATION:  08/19/2002  DATE OF DISCHARGE:                                   CONSULTATION   REFERRING PHYSICIAN:  Physician assistant reacting on behalf of Dr. Lilyan Punt.  [place].   CHIEF COMPLAINT:  Acute left brain stroke.   REASON FOR CONSULTATION:  I was asked by physician assistant reacting on  behalf of Dr. Aundria Rud to see the patient, a 77 year old right-handed  woman with a prior history of stroke.  The patient had been admitted for a  left total knee replacement, which went well on August 17, 2002.  She had  difficulty with morphine PCA and was extremely sleepy with decreased  respirations, necessitating change from PCA to oral Percocet.  The patient  had increase in pain, but seemed to do fairly well.  She was assessed this  morning and appeared to be at her neurologic baseline.  Somewhere around  12:40 to 12:50, the patient was being placed in a sitting position on the  end of the bed.  Her eyes were closed.  She complained of nausea.  Her son,  who was sitting at the bed, felt that she was briefly unresponsive.  She  then began to have flailing movements of her arms, her speech became slurred  and she was quite agitated, crying and rocking.   The patient was seen by the [facility] hospitalist.  I do not know what time  because note was not timed, though it was dated.  I was contacted tonight to  see the patient and agreed to do so.  CT scan of the brain had shown what  appeared to be an acute/subacute left posterior frontal infarction that  involves the central artery.  The point of fact, this lesion could not have  occurred within a several-hour window that would have  taken place between  noon and when the CT scan was performed.   It turns out that the patient had prior cerebrovascular accident involving  the left brain seven years ago when she had a hip replacement.  She suffered  a right hemiparesis, had some problems with her language and recovered very  nicely from that.   Past medical history is also remarkable for depression, irritable bowel  syndrome, glaucoma, history of breast cancer in 1996.   PAST SURGICAL HISTORY:  Past surgical history includes cystocele and  rectocele repair in 1999, right total knee arthroplasty in 1998,  trabeculectomy in 1998, right hip replacement in 1997, left hip replacement  in 1997, breast biopsy and lumpectomy in 1996, hysterectomy in 1971.   CURRENT MEDICATIONS:  1. Dilaudid as needed  for pain.  2. Zofran 2 mg as needed for nausea.  3. Celexa 20 mg per day.  4. Protonix 40 mg per day.  5. Colace 100 mg twice a day.  6. Lovenox 30 mg q.12h.  7. Celebrex 200 mg twice a day.  8. Clindamycin 600 mg in the perioperative period.  9. Aspirin 325 mg has been added to this.   DRUG ALLERGIES:  CEPHALEXIN caused diarrhea and vomiting.   REVIEW OF SYSTEMS:  Review of systems is unremarkable for diabetes,  hypertension, thyroid disease, ulcer, emphysema.  The patient has had some  problems with nausea.  She has decreased vision, particularly in the right  eye, related to her glaucoma.  She had a prior history of chest pain in 2001  which was found to be related to gastroesophageal reflux.  The patient has  problems with urinary urgency related to her cystocele and rectocele repair.  A 12-system review is otherwise negative.   FAMILY HISTORY:  Her father is still alive and had stroke recently in his  5s or early 37s.  Her mother died of cancer of the breast.  Her father has  problems with congestive heart failure and dementia.  The patient had a  brother who died as a child, two other brothers who are in good  health and  sister alive with cholecystectomy and postoperative sepsis.   SOCIAL HISTORY:  The patient is 77 years old.  She has been widowed for six  years.  She has not used tobacco or alcohol.  She lives by herself in a one-  story house with two steps to the main entrance.  She is a retired Nature conservation officer.  Her two children are grown.   PHYSICAL EXAMINATION:  GENERAL:  On examination today, this is a well-  developed, well-nourished woman in no acute distress.  VITAL SIGNS:  Temperature 99.1, resting pulse 104, blood pressure 140/70,  resting pulse 16, pulse oximetry 98% on 2 L of oxygen.  HEENT/NECK:  No signs of infection.  Supple neck.  Full range of motion.  No  cranial of carotid bruits.  LUNGS:  Lungs clear to auscultation.  HEART:  No murmurs.  Pulses normal.  ABDOMEN:  Abdomen soft, nontender.  Bowel sounds normal.  EXTREMITIES:  Extremities are well-formed.  The patient has her left leg  wrapped in a brace.  The right has TED's hose.  I do not see significant  edema or cyanosis in her limbs.  NEUROLOGIC:  Mental status:  The patient was awake.  She has an expressive  aphasia with some problems with paraphasias and dysfluency.  She is able to  name objects.  She can repeat phrases and follow commands.  Cranial nerves:  Round reactive left pupil; the right is poorly reactive.  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  Visual fields are full in counting fingers  in front of her left eye.  The patient has a mild right central 7th; this  most obvious when she is at rest or speaking, otherwise, symmetric facial  strength, midline tongue and uvula.  Air conduction greater than bone  conduction bilaterally.  Motor examination:  The patient shows 4/5 strength  her deltoids, biceps and triceps; 4/5 wrist extensors and flexors; 5/5 in her grip; cannot see fine motor movements in the hand; no true  pronator  drift.  The left  side is normal in the upper extremities.  Lower extremities  are difficult to test.  She has moderate weakness proximally in the right  leg because of pain; she has good strength distally, and in the left leg, we  can only test plantar flexion because it hurts her too much to test the rest  of the limb. Sensation showed a mild peripheral polyneuropathy.  She has a  stereoagnosis in her right hand, good stereoagnosis in her left hand.  Cerebellar examination:  Good finger-to-nose on the left.  She has some mild  tremor and some difficulty with past-pointing with her eyes closed in the  right hand. Gait was obviously not tested.  Reflexes are brisk and symmetric  in the upper extremities, diminished in the lower extremities.  The patient  had a right extensor plantar, left flexor plantar response.   IMPRESSION:  1. Acute left brain infarction probably involving the left opercular region     (insular cortex), judging from the patient's aphasia.  Cranial CT scan     shows a left central artery distribution infarction; this is a branch off     the middle cerebral artery; this must be remote and likely occurred seven     years ago during her hip surgery.  Etiology is unclear but the sudden     onset of her symptoms at the time that she was sitting up strongly     suggests an embolic source.   PLAN:  1. MRI of the brain is contraindicated until staples are removed; this would     be a good procedure of choice to look both at her brain and brain     vasculature.  2. Two-dimensional echocardiogram (TTE) should be done.  We may also need a     TEE in order to rule out a patent foramen ovale with a venous-source     embolus.  3. Carotid Doppler.  4. Morning laboratories including lipid panel, a serum homocysteine,     hemoglobin A1c.  5. Treat with aspirin 325 mg, unless we find atrial fibrillation.  On that     line, I would suggest telemetry to look for paroxysmal  atrial     fibrillation.   I appreciate the opportunity to participate in her care.  If you have  questions or I can be of assistance, do not hesitate to contact me.                                               Deanna Artis. Sharene Skeans, M.D.    [facility]  D:  08/19/2002  T:  08/19/2002  Job:  [id number]   cc:   Mila Homer. [name], M.D.  [address] [place]  [place] [address]  Fax: [phone]   Lilyan Punt. Sydnee Levans, M.D.  [address] [place]  [place] [address]  Fax: [phone]

## 2010-11-24 NOTE — Discharge Summary (Signed)
NAME:  Renee Adams, Renee Adams                         ACCOUNT NO.:  [id number]   MEDICAL RECORD NO.:  [id number]                   PATIENT TYPE:  INP   LOCATION:  5022                                 FACILITY:  [facility]   PHYSICIAN:  Genene Churn. Love, M.D.                 DATE OF BIRTH:  15-Jan-1934   DATE OF ADMISSION:  08/17/2002  DATE OF DISCHARGE:  08/24/2002                                 DISCHARGE SUMMARY   DISCHARGE DIAGNOSES:  1. Bicerebral stroke, embolic source.  2. Positive bubble study with presumption of atrial septal defect (ASD)     and/or patent foramen ovale (PFO).  3. Old left brain stroke.  4. Status post total knee replacement, postoperative day #7.  5. 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  6. Left internal carotid artery (ICA) 60% to 80% stenosis.  7. Depression.  8. History of spastic colon.  9. History of breast cancer.  10.      Gastroesophageal reflux disease.  11.      Cystocele and rectocele repair in 1999.  12.      Hysterectomy in 1971.  13.      Breast lumpectomy in 1996.  14.      Right and left hip replacement in 1997.  15.      Right total knee in 1998.  16.      Trabeculectomy,  right eye in 1998.   DISCHARGE MEDICATIONS:  1. Celexa 20 mg daily.  2. Protonix 40 mg daily.  3. Colace 100 mg b.i.d.  4. Celebrex 200 mg b.i.d.  5. Aspirin 300 mg daily.  6. Potassium 20 mEq b.i.d.  7. Coumadin per pharmacy protocol.  8. Vicodin 1 or 2 tablets every 4-6 hours p.r.n. pain.   PROCEDURES PERFORMED:  1. Total knee replacement performed by Dr. Georgena Spurling, August 17, 2002,     for left knee osteoarthritis, under general anesthesia _____.  The     patient tolerated the procedure well and there were no complications.     She left the operating room with one Hemovac with estimated blood loss of     300 mL.  2. Transesophageal echocardiogram attempted by Dr. Meade Maw on August 21, 2002, to evaluate for possible cardiac embolic source of  stroke.     Multiple attempts were made to introduce the probe with assistance from     Dr. Boyd Kerbs and Dr. Lina Sar.  No one was able to pass the TEE     probe.  A brief upper endoscopy by Dr. Juanda Chance did reveal esophagitis in     distal sphincter structures.  The study was aborted and external bubble     study was performed.  This study did reveal significant crossing of the     bubble from the left to the right side of the heart suggesting either  patent foramen ovale or atrial septal defect.  An MRI of the heart was     recommended to further evaluate as well as GI consultation should     esophagitis continue.  There were no further complications.    STUDIES PERFORMED:  1. Chest x-ray showed no acute pulmonary disease.  2. A CT of the head performed August 18, 2002, showed focal area of     decreased attenuation on left posterior frontal region 2 x 4 cm in size,     suspicious for acute or subacute infarct.  3. Echocardiogram with an ejection fraction at 65-75%.  There was moderate     tricuspid valve regurgitation.  Aortic valve mildly calcified.     Significant increase in PA pressure as estimated by PR velocity     suggesting a pulmonary embolus or lung pathology.  4. Electrocardiogram revealing normal sinus rhythm, possibly inferior     infarct, age undetermined.  However, there has been no significant change     since last tracing in May 2001.  5. An MRA of the brain, performed August 20, 2002, was negative for large     or medium vessel disease.  An MRI of the brain, performed August 20, 2002, showed an acute stroke in the right temporoparietal region and an     acute stroke in the left parietal region, along the anterior margin of an     old stroke in the left parietal area.  There were some highly punctate     regions scattered throughout the parietal lobes bilaterally.  There was     chronic small vessel disease.  6. She had CT of the chest performed on  August 20, 2002, that showed no     acute abnormality, negative for pulmonary emboli.  7. Lower extremity Dopplers were negative for DVT and Baker's cyst.  8. Carotid Dopplers showed no right ICA stenosis, 60-80% ICA stenosis on the     low end of the scale.  Noted, ICA and ECA are tortuous.   LABORATORY DATA:  The INR on the day of discharge 2.7 with a PTT of 56,  hemoglobin was 10.0, white blood cells 6.4, platelets 246.  Chemistry was  within normal limits.  During hospitalization, hemoglobin did drop to 8.6  and the patient was transfused one unit of AB-positive packed red blood  cells.  Hemoglobin elevated to 10.7.  Hemoglobin A1c normal at 5.4.  Homocysteine normal at 11.01.  Lipid profile showed cholesterol 149,  triglycerides 113, HDL 49, and LDL 77.  UA was negative.  Urine culture  showed no growth.   HISTORY OF PRESENT ILLNESS:  [name] is a 77 year old right-handed  white female with a history of a noted deformity in her left knee that  started about two years ago.  She was having pain over the last summer with  any type of walking and range of motion.  She also had an altered gait due  to the pain and the deformity in her knee.  The patient describes the pain  as deep, dull aching sensation with occasional sharp, stabbing pain into the  knee with no radiation.  She has problems at night with pain waking her up.  She has tingling sensation within the knee.  She denies any swelling and  occasionally complains of lockup.  X-rays reveal a complete collapse of  her lateral compartment of her left knee.  She was admitted by Dr. Jeannett Senior  Lucey for total knee replacement.   The patient tolerated the surgery well.  Postoperatively, she received PCA  morphine for pain.  Her respirations decreased to four times per minute and  Narcan was given with good result.  Accompanying the altered level of consciousness and decreased respirations, the patient complained of right   hand weakness and slurred speech.  Internal medicine was consulted.   Dr. Aundria Rud evaluated the patient and felt that the symptoms of  altered mental status and slurred speech have improved over the past 90  minutes.  His concern was that it was a TIA versus stroke versus medication  side effects.  He noted there was no intraoperative or unusual loss of  intravascular volume or hemodynamic problems or respiratory hemodynamic  problems.  Respiratory depression was clearly improved after the  administration of Narcan.  Hemoglobin has dropped 3.5 g and it was  recommended that she be transfused.  He did confirm acute infarcts, so  neurology was consulted.   Dr. Billie Ruddy evaluated the patient.  He found that somewhere between  1240 and 1250, on August 19, 2002, the patient was placed in the sitting  position at the side of the bed by physical therapy.  Her eyes were closed  and she complained of nausea.  Her son, who was sitting at the bedside, felt  she was briefly unresponsive.  She began to have flailing movements of her  arms and her speech became slurred and she was agitated, crying, and  rocking.  A CT of the brain showed an acute subacute left hemisphere frontal  infarction involving the central artery.  It turns out the patient had a  prior cerebrovascular accident involving her left brain seven years ago,  status post total hip replacement.  At that time, she had right hemiparesis  and some problems with her language, but recovered very nicely.  It was felt  that the patient potentially had a new acute infarction on top of her old  infarction.  MRA confirmed this fact and also revealed potential for embolic  sources, strokes were bicerebrally located.   Negative 2-D echocardiogram for embolic source led to request for  transesophageal echocardiogram by Dr. Meade Maw.  She was unable to  progress with the scope long enough due to significant esophagitis and   strictures, but a bubble study was performed that was revealing or atrial  septal defect/patent foramen ovale.  With bicerebral stroke and positive  bubble study, it was felt that the patient would be best served to be  treated by Coumadin.  The patient was placed on IV heparin and switch to  Coumadin was initiated.  The patient progressed nicely with therapies, but  was very concerned about her return home.  The decision was made to send the  patient to rehabilitation for short stay and then to return home with her  son and daughter-in-law for further care.   CONDITION ON DISCHARGE:  The patient is alert and oriented x3.  Speech is  clear.  No aphasia.  She is able to follow commands.  Cranial nerves were  intact and visual fields were full, except in her right eye where she had a  superior nasal field cut related to glaucoma.  She moved all her extremities well.  Her chest was clear to auscultation bilaterally.  Her heart rate was  regular.  Her abdomen was soft with bowel sounds x4.  She had no abdominal  tenderness.  Her left lower  extremity was not tender and Homans' was  negative.  Her EHL and FHL were intact.  Her wound was clean, dry, and  intact.   DISCHARGE PLAN:  1. Transfer to rehabilitation.  Continue PT and OT.  Would focus on     transferring and ambulation.  2. Activity as tolerated with walker.  3. Keep knee wound clean.  May shower on Wednesday.  4. Continue CPL machine at 0-50 degrees eight hours a day and increased 5     degrees everyday.  5.     Call [phone] for followup appointment with Dr. [name] in one week.  6. Make an appointment with Demetrio Lapping, P.A., on a day that Dr. Avie Echevaria is there in 4-6 weeks after discharge from rehabilitation.     Annie Main, N.P.                         Genene Churn. Sandria Manly, M.D.    SB/MEDQ  D:  08/24/2002  T:  08/24/2002  Job:  [id number]   cc:   L. Lupe Carney, M.D.  [address] [place]  [place] [address]   Fax: [phone]   Georgena Spurling, M.D.   Meade Maw, M.D.  [address]  [place]  [place] [address]  Fax: [phone]

## 2010-12-14 ENCOUNTER — Encounter (INDEPENDENT_AMBULATORY_CARE_PROVIDER_SITE_OTHER): Payer: Self-pay | Admitting: General Surgery

## 2011-01-09 ENCOUNTER — Other Ambulatory Visit (HOSPITAL_COMMUNITY): Payer: Self-pay | Admitting: Orthopedic Surgery

## 2011-01-09 DIAGNOSIS — T84038A Mechanical loosening of other internal prosthetic joint, initial encounter: Secondary | ICD-10-CM

## 2011-01-09 DIAGNOSIS — Z96659 Presence of unspecified artificial knee joint: Secondary | ICD-10-CM

## 2011-01-19 ENCOUNTER — Other Ambulatory Visit (HOSPITAL_COMMUNITY): Payer: Self-pay | Admitting: Orthopedic Surgery

## 2011-01-19 ENCOUNTER — Encounter (HOSPITAL_COMMUNITY)
Admission: RE | Admit: 2011-01-19 | Discharge: 2011-01-19 | Disposition: A | Discharge status: Home / Self Care | Payer: Medicare Other | Source: Ambulatory Visit | Attending: Orthopedic Surgery | Admitting: Orthopedic Surgery

## 2011-01-19 ENCOUNTER — Encounter (HOSPITAL_COMMUNITY): Payer: Self-pay

## 2011-01-19 DIAGNOSIS — T84038A Mechanical loosening of other internal prosthetic joint, initial encounter: Secondary | ICD-10-CM

## 2011-01-19 DIAGNOSIS — Z96659 Presence of unspecified artificial knee joint: Secondary | ICD-10-CM | POA: Insufficient documentation

## 2011-01-19 DIAGNOSIS — R948 Abnormal results of function studies of other organs and systems: Secondary | ICD-10-CM | POA: Insufficient documentation

## 2011-01-19 HISTORY — DX: Malignant (primary) neoplasm, unspecified: C80.1

## 2011-01-19 MED ORDER — TECHNETIUM TC 99M MEDRONATE IV KIT
25.0000 | PACK | Freq: Once | INTRAVENOUS | Status: AC | PRN
  Administered 2011-01-19: 25 via INTRAVENOUS

## 2011-02-08 ENCOUNTER — Other Ambulatory Visit (HOSPITAL_COMMUNITY): Payer: Medicare Other

## 2011-02-08 ENCOUNTER — Ambulatory Visit (HOSPITAL_COMMUNITY)
Admission: RE | Admit: 2011-02-08 | Discharge: 2011-02-08 | Disposition: A | Discharge status: Home / Self Care | Payer: Medicare Other | Source: Ambulatory Visit | Attending: Orthopedic Surgery | Admitting: Orthopedic Surgery

## 2011-02-08 ENCOUNTER — Encounter (HOSPITAL_COMMUNITY)
Admission: RE | Admit: 2011-02-08 | Discharge: 2011-02-08 | Disposition: A | Discharge status: Home / Self Care | Payer: Medicare Other | Source: Ambulatory Visit | Attending: Orthopedic Surgery | Admitting: Orthopedic Surgery

## 2011-02-08 ENCOUNTER — Other Ambulatory Visit (HOSPITAL_COMMUNITY): Payer: Self-pay | Admitting: Orthopedic Surgery

## 2011-02-08 DIAGNOSIS — Z01818 Encounter for other preprocedural examination: Secondary | ICD-10-CM | POA: Insufficient documentation

## 2011-02-08 DIAGNOSIS — Z01810 Encounter for preprocedural cardiovascular examination: Secondary | ICD-10-CM | POA: Insufficient documentation

## 2011-02-08 DIAGNOSIS — Z01812 Encounter for preprocedural laboratory examination: Secondary | ICD-10-CM | POA: Insufficient documentation

## 2011-02-08 DIAGNOSIS — M19019 Primary osteoarthritis, unspecified shoulder: Secondary | ICD-10-CM | POA: Insufficient documentation

## 2011-02-08 DIAGNOSIS — T84012A Broken internal right knee prosthesis, initial encounter: Secondary | ICD-10-CM

## 2011-02-08 LAB — URINALYSIS, ROUTINE W REFLEX MICROSCOPIC
Glucose, UA: NEGATIVE mg/dL
Hgb urine dipstick: NEGATIVE
Ketones, ur: NEGATIVE mg/dL
Protein, ur: NEGATIVE mg/dL
pH: 6.5 (ref 5.0–8.0)

## 2011-02-08 LAB — COMPREHENSIVE METABOLIC PANEL
ALT: 17 U/L (ref 0–35)
AST: 23 U/L (ref 0–37)
Albumin: 3.9 g/dL (ref 3.5–5.2)
Alkaline Phosphatase: 85 U/L (ref 39–117)
Chloride: 104 mEq/L (ref 96–112)
Potassium: 4.3 mEq/L (ref 3.5–5.1)
Sodium: 140 mEq/L (ref 135–145)
Total Bilirubin: 0.6 mg/dL (ref 0.3–1.2)

## 2011-02-08 LAB — DIFFERENTIAL
Basophils Relative: 3 % — ABNORMAL HIGH (ref 0–1)
Eosinophils Relative: 3 % (ref 0–5)
Monocytes Absolute: 0.6 10*3/uL (ref 0.1–1.0)
Neutrophils Relative %: 62 % (ref 43–77)

## 2011-02-08 LAB — PROTIME-INR: INR: 1.96 — ABNORMAL HIGH (ref 0.00–1.49)

## 2011-02-08 LAB — URINE MICROSCOPIC-ADD ON

## 2011-02-08 LAB — APTT: aPTT: 41 seconds — ABNORMAL HIGH (ref 24–37)

## 2011-02-08 LAB — CBC
HCT: 44.1 % (ref 36.0–46.0)
Hemoglobin: 15 g/dL (ref 12.0–15.0)
MCHC: 34 g/dL (ref 30.0–36.0)
RBC: 5.02 MIL/uL (ref 3.87–5.11)
WBC: 7.9 10*3/uL (ref 4.0–10.5)

## 2011-02-10 LAB — URINE CULTURE: Colony Count: >=100000

## 2011-02-15 ENCOUNTER — Ambulatory Visit: Payer: Medicare Other

## 2011-02-16 ENCOUNTER — Ambulatory Visit
Admission: RE | Admit: 2011-02-16 | Discharge: 2011-02-16 | Disposition: A | Discharge status: Home / Self Care | Payer: Medicare Other | Source: Ambulatory Visit | Attending: Oncology | Admitting: Oncology

## 2011-02-16 DIAGNOSIS — Z1231 Encounter for screening mammogram for malignant neoplasm of breast: Secondary | ICD-10-CM

## 2011-02-16 DIAGNOSIS — Z9889 Other specified postprocedural states: Secondary | ICD-10-CM

## 2011-02-19 ENCOUNTER — Inpatient Hospital Stay (HOSPITAL_COMMUNITY)
Admission: RE | Admit: 2011-02-19 | Discharge: 2011-02-22 | DRG: 467 | Disposition: A | Discharge status: Skilled Nursing Facility | Payer: Medicare Other | Source: Ambulatory Visit | Attending: Orthopedic Surgery | Admitting: Orthopedic Surgery

## 2011-02-19 DIAGNOSIS — Y838 Other surgical procedures as the cause of abnormal reaction of the patient, or of later complication, without mention of misadventure at the time of the procedure: Secondary | ICD-10-CM | POA: Diagnosis present

## 2011-02-19 DIAGNOSIS — D62 Acute posthemorrhagic anemia: Secondary | ICD-10-CM | POA: Diagnosis not present

## 2011-02-19 DIAGNOSIS — I10 Essential (primary) hypertension: Secondary | ICD-10-CM | POA: Diagnosis present

## 2011-02-19 DIAGNOSIS — Z96659 Presence of unspecified artificial knee joint: Secondary | ICD-10-CM

## 2011-02-19 DIAGNOSIS — Z8673 Personal history of transient ischemic attack (TIA), and cerebral infarction without residual deficits: Secondary | ICD-10-CM

## 2011-02-19 DIAGNOSIS — K219 Gastro-esophageal reflux disease without esophagitis: Secondary | ICD-10-CM | POA: Diagnosis present

## 2011-02-19 DIAGNOSIS — Z882 Allergy status to sulfonamides status: Secondary | ICD-10-CM

## 2011-02-19 DIAGNOSIS — T84099A Other mechanical complication of unspecified internal joint prosthesis, initial encounter: Principal | ICD-10-CM | POA: Diagnosis present

## 2011-02-19 DIAGNOSIS — Z888 Allergy status to other drugs, medicaments and biological substances status: Secondary | ICD-10-CM

## 2011-02-19 DIAGNOSIS — Z853 Personal history of malignant neoplasm of breast: Secondary | ICD-10-CM

## 2011-02-19 LAB — PROTIME-INR: INR: 1 (ref 0.00–1.49)

## 2011-02-20 ENCOUNTER — Inpatient Hospital Stay (HOSPITAL_COMMUNITY): Payer: Medicare Other

## 2011-02-20 LAB — BASIC METABOLIC PANEL
BUN: 12 mg/dL (ref 6–23)
CO2: 25 mEq/L (ref 19–32)
Calcium: 8 mg/dL — ABNORMAL LOW (ref 8.4–10.5)
Chloride: 107 mEq/L (ref 96–112)
Creatinine, Ser: 0.95 mg/dL (ref 0.50–1.10)
Glucose, Bld: 110 mg/dL — ABNORMAL HIGH (ref 70–99)

## 2011-02-20 LAB — PROTIME-INR: INR: 1.29 (ref 0.00–1.49)

## 2011-02-20 LAB — CBC
HCT: 31.6 % — ABNORMAL LOW (ref 36.0–46.0)
Hemoglobin: 10.4 g/dL — ABNORMAL LOW (ref 12.0–15.0)
MCH: 29.6 pg (ref 26.0–34.0)
MCHC: 32.9 g/dL (ref 30.0–36.0)
RDW: 15 % (ref 11.5–15.5)

## 2011-02-21 LAB — PROTIME-INR: INR: 1.91 — ABNORMAL HIGH (ref 0.00–1.49)

## 2011-02-21 LAB — CBC
HCT: 28.8 % — ABNORMAL LOW (ref 36.0–46.0)
Hemoglobin: 9.5 g/dL — ABNORMAL LOW (ref 12.0–15.0)
RBC: 3.17 MIL/uL — ABNORMAL LOW (ref 3.87–5.11)
WBC: 8.8 10*3/uL (ref 4.0–10.5)

## 2011-02-21 LAB — BASIC METABOLIC PANEL
CO2: 26 mEq/L (ref 19–32)
Chloride: 107 mEq/L (ref 96–112)
Sodium: 138 mEq/L (ref 135–145)

## 2011-02-22 LAB — CBC
Hemoglobin: 8.1 g/dL — ABNORMAL LOW (ref 12.0–15.0)
MCHC: 33.1 g/dL (ref 30.0–36.0)
RBC: 2.75 MIL/uL — ABNORMAL LOW (ref 3.87–5.11)

## 2011-02-22 LAB — BASIC METABOLIC PANEL
GFR calc non Af Amer: >60 mL/min (ref >60)
Glucose, Bld: 96 mg/dL (ref 70–99)
Potassium: 4.2 mEq/L (ref 3.5–5.1)
Sodium: 139 mEq/L (ref 135–145)

## 2011-02-22 LAB — PROTIME-INR
INR: 3.05 — ABNORMAL HIGH (ref 0.00–1.49)
Prothrombin Time: 32 seconds — ABNORMAL HIGH (ref 11.6–15.2)

## 2011-02-23 LAB — CROSSMATCH
ABO/RH(D): AB POS
Unit division: 0

## 2011-02-23 NOTE — Op Note (Signed)
NAMEDEVENEY, BAYON NO.:  [id number]  MEDICAL RECORD NO.:  [id number]  LOCATION:  5011                         FACILITY:  [facility]  PHYSICIAN:  Mila Homer. [name], M.D. DATE OF BIRTH:  1933/09/03  DATE OF PROCEDURE:  02/19/2011 DATE OF DISCHARGE:                              OPERATIVE REPORT   SURGEON:  Mila Homer. [name], MD  ASSISTANT:  [name], PA-C and Skip Mayer, PA-C  ANESTHESIA:  General.  PREOPERATIVE DIAGNOSIS:  Right failed knee replacement.  POSTOPERATIVE DIAGNOSIS:  Right failed knee replacement.  PROCEDURE:  Right revision total knee arthroplasty.  INDICATIONS FOR PROCEDURE:  The patient is a 77 year old white female who failed conservative measures, radiographic evidence of loosening and polyethylene wear.  Informed consent was obtained.  DESCRIPTION OF PROCEDURE:  The patient laid supine, administered general anesthesia.  Right leg prepped and draped in usual sterile fashion.  The extremity was exsanguinated with Esmarch and tourniquet inflated to 350 mmHg.  I made an anterior incision through the old incision with a #10 blade.  This is approximately 8 inches in length.  I then used the new 10 blade to make a median parapatellar arthrotomy.  There was lots of polyethylene synovitis.  I performed a complete synovectomy with fresh #10 blade and pickups.  I then everted the patella.  The patella had significant wear and pitting.  I removed the patella and removed the curettage and cement.  Then used a 32 trial, drilled through lug holes and I had recreated a 22-mm thick patella which I felt was adequate.  I then turned my attention to the femur where I used a small sagittal saw and loosened up from the cement interface and removed the femur easily. There was significant degree of bone loss, probably 20 mL volume wise. I removed all loose bone with curettes and rongeurs.  I then turned my attention to the tibia.  I could easily deliver  the tibia knocking it with a bone tamp and then removed all of the old cement and soft bone. I then sagittally sized to a D.  I then placed a D femur and chose that with good medial lateral matching.  I then reamed sequentially to 13 on the tibia, 16 on the femur.  I then placed the cup of the notch on the D femur with the 16 stem up.  I had to use anterior augment and a 5 distal medial augment.  I then turned my attention to the tibia where I made an intramedullary guide cut, freshening up tibial surface.  I then prepared for a size 3 on the tibia.  I then trialed with a D femur with a 16 stem with 5 distal augment, an anterior flange augment, a size 3 tibia with a 14 stem.  The femoral stem was on offset to 6 o'clock position pulling it down, closing the flexion gap.  I also had excellent patellar tracking with the prosthetic trial in place.  I then removed all the prosthesis prosthetic trials.  I copiously irrigated.  I then cemented the components into place removing all excess cement, allowing cement to harden in extension.  I had excellent flexion/extension gap  balance with a regular 14 LPS poly.  I then lavaged, I closed with buried 0 Vicryl in deep soft tissues, #1s in the arthrotomy, running 2-0 Vicryl and skin staples.  Dressed with Xeroform, dry sponges, sterile Webril, and TED stocking.  COMPLICATIONS:  None.  DRAINS:  One Hemovac, one pain catheter  ESTIMATED BLOOD LOSS:  300 mL.  TOURNIQUET TIME:  90 minutes.          ______________________________ Mila Homer [name], M.D.     SDL/MEDQ  D:  02/19/2011  T:  02/20/2011  Job:  [id number]  Electronically Signed by Georgena Spurling M.D. on 02/23/2011 02:39:46 PM

## 2011-04-15 NOTE — Discharge Summary (Signed)
Renee Adams, ENNEKING NO.:  [id number]  MEDICAL RECORD NO.:  [id number]  LOCATION:  5011                         FACILITY:  [facility]  PHYSICIAN:  Mila Homer. Renee Adams, M.D. DATE OF BIRTH:  24-Dec-1933  DATE OF ADMISSION:  02/19/2011 DATE OF DISCHARGE:  02/22/2011                              DISCHARGE SUMMARY   ADMISSION DIAGNOSIS:  Failed right total knee arthroplasty.  DISCHARGE DIAGNOSIS: 1. Failed right total knee arthroplasty. 2. Status post total knee revision. 3. Acute blood loss anemia, status post surgery.  PROCEDURE:  Right total knee revision.  HISTORY:  The patient is a 77 year old female complained of pain on the right knee is now interfering with activities of daily living. Conservative treatments failed.  Bone density scan showed loosening of the prosthesis and the patient was subsequently set up for a right total knee revision.  Risks and benefits of surgery were explained to thepatient.  ALLERGIES:  The patient is allergic to HYDROMORPHONE, MORPHINE, AND SULFA.  ADMISSION MEDICATIONS:  Upon admission to the hospital, the patient was taking 1. Atorvastatin 20 mg. 2. Bupropion XL 30 mg daily. 3. Caltrate daily,. 4. Sertraline 100 mg daily. 5. Tylenol Extra Strength every 6 hours as needed. 6. Vitamin B complex with C daily. 7. Vitamin D2 50,000 units weekly. 8. Warfarin 2 mg one to one and a half tablets by mouth daily. 9. Xalatan 0.005% ophthalmic 1 drop both eyes at bedtime.  HOSPITAL COURSE:  This is a 77 year old female admitted on February 19, 2011, after appropriate laboratory studies were obtained preoperatively as well as  Ancef on-call to the operating room, she was taken to the OR where she underwent a right total knee revision.  She tolerated the procedure well and in good condition, she was placed on p.o. pain medication and a Foley was placed intraoperatively.  Postop day 1, vital signs were stable.  The patient denied chest  pain, shortness breath or calf pain.  The patient was started on Lovenox 30 mg subcu q.12 h at 8:00 a.m. as well as on Coumadin per pharmacy protocol. Consults PT, OT and Care Management were made.  The patient was weightbearing as tolerated in the CPM 0-90 degrees for 6-8 hours per day.  Incentive spirometry was taught.  The patient did have incidental physical therapy where felt like her knee popped when she got up for the first time and therapist just called me with mention of a vagus knee, we obtained x-ray and all the parts were where they should have been.  No deformity noted on x-ray.  Postop day #2, the patient continued to progress with physical therapy, did do better, did explain a little bit of dizziness and nausea, vomiting.  Dressing was changed.  Marcaine pump and Hemovac were deceased.  Foley was discontinued.  The patient was continued on p.o. pain medications.  Her OxyContin 20 mg was dropped down to OxyContin 10 mg.  The patient did start to experience hand neurologic defects.  The patient states she does not think she can use her hands as she was using she is having trouble grasping and gripping items, did have good grip strength when I tested her.  Postop day #3, the patient continued to progress with physical therapy, was given 1 unit of blood for postop anemia, hemoglobin being 8.1.  The patient was discharged to [facility].  LABORATORY DATA:  Laboratory studies upon discharge from the hospital, the patient's electrolytes and chems were normal.  Vital signs were stable and blood level is 8.1 hemoglobin which is why she received the blood transfusion.  DISCHARGE INSTRUCTIONS:  There are no restrictions to diet.  She is to follow the instruction sheet given for dressing.  She may increase activity slowly.  May use a cane or walker, weightbearing as tolerated. No lifting or driving for 6 weeks.  The patient may shower normally on Friday.  He is to  continue PT at the nursing facility, continue CPM 0-90 for 6-8 hours per day for 2 weeks and to continue with her foam block when not in the CPM.  Upon discharge from the hospital, the patient was given prescriptions for Robaxin 500 mg 1-2 tablets every 6-8 hours as needed for pain and for spasm, oxycodone 5 mg 1-2 tablets every 4-6 hours as needed for pain and OxyContin 10 mg twice daily.  The patient is to follow up with Dr. Sherlean Adams on March 06, 2011, call for an appointment to confirm [phone].  The patient was discharged in improved condition, would like to ask the doctors at the nursing facility to address these neurologic defects that I mentioned earlier.  The patient does have a history of these things and was not quite sure what the diagnosis was, but it has happened after total knee replacement before I am not sure what the treatment has been either, but we would like to defer care to [facility] to address these issues, thus the patient to see Dr. Sandria Manly for these problems and he may be a consult as well.    ______________________________ Renee Cabal, PA-C   ______________________________ Mila Homer. Renee Adams, M.D.    MJ/MEDQ  D:  02/22/2011  T:  02/22/2011  Job:  [id number]  Electronically Signed by Renee Cabal PA-C on 03/28/2011 04:42:03 PM Electronically Signed by Georgena Spurling M.D. on 04/15/2011 11:18:31 AM

## 2011-04-17 ENCOUNTER — Encounter (INDEPENDENT_AMBULATORY_CARE_PROVIDER_SITE_OTHER): Payer: Self-pay | Admitting: General Surgery

## 2011-07-11 DIAGNOSIS — Z7901 Long term (current) use of anticoagulants: Secondary | ICD-10-CM | POA: Diagnosis not present

## 2011-07-11 DIAGNOSIS — I6789 Other cerebrovascular disease: Secondary | ICD-10-CM | POA: Diagnosis not present

## 2011-07-18 DIAGNOSIS — H4011X0 Primary open-angle glaucoma, stage unspecified: Secondary | ICD-10-CM | POA: Diagnosis not present

## 2011-07-20 DIAGNOSIS — Z7901 Long term (current) use of anticoagulants: Secondary | ICD-10-CM | POA: Diagnosis not present

## 2011-07-20 DIAGNOSIS — I6789 Other cerebrovascular disease: Secondary | ICD-10-CM | POA: Diagnosis not present

## 2011-07-23 DIAGNOSIS — H2640 Unspecified secondary cataract: Secondary | ICD-10-CM | POA: Diagnosis not present

## 2011-07-23 DIAGNOSIS — H26499 Other secondary cataract, unspecified eye: Secondary | ICD-10-CM | POA: Diagnosis not present

## 2011-07-27 ENCOUNTER — Telehealth: Payer: Self-pay | Admitting: Oncology

## 2011-07-27 NOTE — Telephone Encounter (Signed)
Talked to pt, gave her appt date for 08/16/11 , lab and ML

## 2011-08-09 DIAGNOSIS — M25549 Pain in joints of unspecified hand: Secondary | ICD-10-CM | POA: Diagnosis not present

## 2011-08-09 DIAGNOSIS — M25559 Pain in unspecified hip: Secondary | ICD-10-CM | POA: Diagnosis not present

## 2011-08-09 DIAGNOSIS — M545 Low back pain: Secondary | ICD-10-CM | POA: Diagnosis not present

## 2011-08-09 DIAGNOSIS — M25569 Pain in unspecified knee: Secondary | ICD-10-CM | POA: Diagnosis not present

## 2011-08-10 DIAGNOSIS — Z7901 Long term (current) use of anticoagulants: Secondary | ICD-10-CM | POA: Diagnosis not present

## 2011-08-10 DIAGNOSIS — I6789 Other cerebrovascular disease: Secondary | ICD-10-CM | POA: Diagnosis not present

## 2011-08-15 ENCOUNTER — Telehealth: Payer: Self-pay | Admitting: *Deleted

## 2011-08-15 NOTE — Telephone Encounter (Signed)
left message to inform the patient of the new date and time in 08-2011 

## 2011-08-16 ENCOUNTER — Other Ambulatory Visit: Payer: Medicare Other | Admitting: Lab

## 2011-08-16 ENCOUNTER — Ambulatory Visit: Payer: Medicare Other | Admitting: Physician Assistant

## 2011-08-31 ENCOUNTER — Other Ambulatory Visit: Payer: Medicare Other | Admitting: Lab

## 2011-08-31 ENCOUNTER — Ambulatory Visit: Payer: Medicare Other | Admitting: Physician Assistant

## 2011-09-04 DIAGNOSIS — H4011X0 Primary open-angle glaucoma, stage unspecified: Secondary | ICD-10-CM | POA: Diagnosis not present

## 2011-09-04 DIAGNOSIS — H409 Unspecified glaucoma: Secondary | ICD-10-CM | POA: Diagnosis not present

## 2011-09-07 DIAGNOSIS — I6789 Other cerebrovascular disease: Secondary | ICD-10-CM | POA: Diagnosis not present

## 2011-09-07 DIAGNOSIS — Z7901 Long term (current) use of anticoagulants: Secondary | ICD-10-CM | POA: Diagnosis not present

## 2011-09-12 ENCOUNTER — Telehealth: Payer: Self-pay | Admitting: Oncology

## 2011-09-12 ENCOUNTER — Encounter: Payer: Self-pay | Admitting: Physician Assistant

## 2011-09-12 ENCOUNTER — Other Ambulatory Visit (HOSPITAL_BASED_OUTPATIENT_CLINIC_OR_DEPARTMENT_OTHER): Payer: Medicare Other | Admitting: Lab

## 2011-09-12 ENCOUNTER — Ambulatory Visit (HOSPITAL_BASED_OUTPATIENT_CLINIC_OR_DEPARTMENT_OTHER): Payer: Medicare Other | Admitting: Physician Assistant

## 2011-09-12 VITALS — BP 122/72 | HR 93 | Temp 98.5°F | Ht 65.0 in | Wt 145.4 lb

## 2011-09-12 DIAGNOSIS — N644 Mastodynia: Secondary | ICD-10-CM

## 2011-09-12 DIAGNOSIS — Z853 Personal history of malignant neoplasm of breast: Secondary | ICD-10-CM

## 2011-09-12 DIAGNOSIS — Z1231 Encounter for screening mammogram for malignant neoplasm of breast: Secondary | ICD-10-CM

## 2011-09-12 DIAGNOSIS — C50912 Malignant neoplasm of unspecified site of left female breast: Secondary | ICD-10-CM | POA: Insufficient documentation

## 2011-09-12 LAB — CBC WITH DIFFERENTIAL/PLATELET
BASO%: 1.8 % (ref 0.0–2.0)
EOS%: 2 % (ref 0.0–7.0)
LYMPH%: 20 % (ref 14.0–49.7)
MCH: 29 pg (ref 25.1–34.0)
MCHC: 33.4 g/dL (ref 31.5–36.0)
MCV: 86.9 fL (ref 79.5–101.0)
MONO%: 4.6 % (ref 0.0–14.0)
NEUT#: 5.4 10*3/uL (ref 1.5–6.5)
RBC: 4.78 10*6/uL (ref 3.70–5.45)
RDW: 15.5 % — ABNORMAL HIGH (ref 11.2–14.5)

## 2011-09-12 NOTE — Progress Notes (Signed)
Hematology and Oncology Follow Up Visit  Renee Adams [id number] 08-28-1933 78 y.o. 09/12/2011    HPI: Renee Adams was originally diagnosed with a left breast carcinoma in early 1996. Tumor was T1, N0, ER and PR positive. She underwent left lumpectomy and sentinel lymph node dissection in March of 1996.  Patient was treated with tamoxifen for 5 years, then Evista for 3 years. The distal was discontinued secondary to history of stroke. Currently being followed with observation alone.  Interim History:   Renee Adams returns today for routine one-year followup of her remote left breast carcinoma. Interval history is remarkable for Renee Adams having undergone a reexcision of a right knee replacement in August 2012. (Original replacement surgery was in 1997.) She tolerated the procedure well. She's had some time in rehabilitation, and is walking well with no pain. She does have some joint pain associated with arthritis although this has not changed significantly.  Otherwise, Renee Adams his biggest complaint today is some increased pain in the left breast. Of course her lumpectomy was remote, over 15 years ago. She first noticed the pain in the left breast in December 2012. She describes this as a "throbbing" pain around the area of the left lumpectomy and beneath the nipple complex.  She has noticed no suspicious masses or skin changes in the area.  Otherwise Renee Adams his energy level is good. She continues on Coumadin, followed by her cardiologist Dr. Eldridge Dace, and has had no abnormal bleeding other than one nosebleed 10 days ago which resolved easily and has not recurred. She's had no change in bowel habits. No problems with nausea or emesis. She thinks she is due for screening colonoscopy, and will discuss this with her primary care physician when she goes for routine physical in April.   A detailed review of systems is otherwise noncontributory as noted below.  Review of Systems: Constitutional:  no weight loss,  fever, night sweats and feels well Eyes: uses glasses VHQ:IONGEXBM Cardiovascular: no chest pain or dyspnea on exertion Respiratory: no cough, shortness of breath, or wheezing Neurological: no TIA or stroke symptoms negative Dermatological: negative Gastrointestinal: no abdominal pain, nausea, change in bowel habits, or black or bloody stools Genito-Urinary: no dysuria, trouble voiding, or hematuria Hematological and Lymphatic: negative Breast: positive for - pain in left breast Musculoskeletal: positive for - joint pain and joint stiffness Remaining ROS negative.   Medications:   I have reviewed the patient's current medications.  Current Outpatient Prescriptions  Medication Sig Dispense Refill  . amLODipine (NORVASC) 5 MG tablet Take 5 mg by mouth daily.      Marland Kitchen atorvastatin (LIPITOR) 10 MG tablet Take 10 mg by mouth daily.        Marland Kitchen buPROPion (WELLBUTRIN SR) 100 MG 12 hr tablet Take 100 mg by mouth 2 (two) times daily.        . calcium carbonate 200 MG capsule Take 600 mg by mouth 2 (two) times daily with a meal.      . fish oil-omega-3 fatty acids 1000 MG capsule Take 1 g by mouth daily.      Marland Kitchen glucosamine-chondroitin 500-400 MG tablet Take 1 tablet by mouth 3 (three) times daily.      . pramipexole (MIRAPEX) 0.125 MG tablet Take 0.125 mg by mouth 3 (three) times daily.      . sertraline (ZOLOFT) 25 MG tablet Take 25 mg by mouth daily.        Marland Kitchen warfarin (COUMADIN) 1 MG tablet Take 1 mg by mouth as directed.  Allergies:  Allergies  Allergen Reactions  . Dilaudid (Hydromorphone Hcl) Shortness Of Breath  . Morphine And Related Shortness Of Breath  . Sulfa Antibiotics Other (See Comments)    "Made me drunk";  wobbly    Physical Exam: Filed Vitals:   09/12/11 1401  BP: 122/72  Pulse: 93  Temp: 98.5 F (36.9 C)    Body mass index is 24.20 kg/(m^2). HEENT:  Sclerae anicteric.  Oropharynx clear.  Nodes:  No cervical, supraclavicular, or axillary lymphadenopathy  palpated.  Breast Exam:  Right breast is benign, no masses, skin changes, or nipple inversion. Left breast is status post lumpectomy. Well-healed incisions. No suspicious skin changes. There is perhaps some thickening just beneath the incision, but no frank masses. Lungs:  Clear to auscultation bilaterally.  No crackles, rhonchi, or wheezes.   Heart:  Regular rate and rhythm.   Abdomen:  Soft, nontender.  Positive bowel sounds.  No organomegaly or masses palpated.   Musculoskeletal:  No focal spinal tenderness to palpation.  Extremities:  Benign.  No peripheral edema or cyanosis.   Skin:  Benign.   Neuro:  Nonfocal.   Lab Results: Lab Results  Component Value Date   WBC 7.6 09/12/2011   HGB 13.9 09/12/2011   HCT 41.5 09/12/2011   MCV 86.9 09/12/2011   PLT 226 09/12/2011   NEUTROABS 5.4 09/12/2011     Chemistry      Component Value Date/Time   NA 139 02/22/2011 0630   K 4.2 02/22/2011 0630   CL 110 02/22/2011 0630   CO2 26 02/22/2011 0630   BUN 9 02/22/2011 0630   CREATININE 0.70 02/22/2011 0630      Component Value Date/Time   CALCIUM 8.3* 02/22/2011 0630   ALKPHOS 85 02/08/2011 1341   AST 23 02/08/2011 1341   ALT 17 02/08/2011 1341   BILITOT 0.6 02/08/2011 1341      Radiological Studies:  DG SCREEN MAMMOGRAM BILATERAL  Bilateral CC and MLO view(s) were taken.  Prior study comparison: January 30, 2008, [place] screen mammogram bilateral.  DIGITAL SCREENING MAMMOGRAM WITH CAD:  Comparison: Prior studies.  There are scattered fibroglandular densities. Left lumpectomy changes are present. There is no  dominant mass, architectural distortion or calcification to suggest malignancy.  Images were processed with CAD.  IMPRESSION:  No mammographic evidence of malignancy. Suggest yearly screening mammography.  A result letter of this screening mammogram will be mailed directly to the patient.  ASSESSMENT: Negative - BI-RADS 1  Screening mammogram in 1 year.    Assessment:  A 78 year old [place]  woman status post left lumpectomy and sentinel lymph node dissection March 1996 for a T1, N0, ER/PR positive breast carcinoma.  Treated with tamoxifen for five years, then Evista for three years with the Evista discontinued secondary to stroke.  Currently being followed with observation alone.    Plan:  Petrea appears to be doing well, but is very concerned about the pain in the left breast. Accordingly, I think it would be prudent to evaluate this further with a diagnostic left mammogram, possibly followed by a left ultrasound.  Assuming the diagnostic mammogram is unremarkable, Lynora will return to her regular schedule. This will involve a screening bilateral mammogram in August, followed by a visit with Dr. Kae Heller. She will return here in one year to see either myself or Dr. Darnelle Catalan.   This plan was reviewed with the patient, who voices understanding and agreement.  She knows to call with any changes or problems.  Recardo Linn, PA-C 09/12/2011

## 2011-09-12 NOTE — Telephone Encounter (Signed)
gve the pt  2014 appt calendar along with the mammo/us appt as needed. S/w tina from [facility] regarding the pt needing an appt with dr Abbey Chatters. The pt has been put on a recall list for aug. Pt is aware of the process

## 2011-09-14 ENCOUNTER — Telehealth: Payer: Self-pay | Admitting: Oncology

## 2011-09-14 NOTE — Telephone Encounter (Signed)
lmonvm adviisng the pt of her new pt appt at [facility] asked the pt to call back to confirm her message

## 2011-09-15 ENCOUNTER — Emergency Department (HOSPITAL_COMMUNITY)
Admission: EM | Admit: 2011-09-15 | Discharge: 2011-09-15 | Disposition: A | Discharge status: Home / Self Care | Payer: Medicare Other | Attending: Emergency Medicine | Admitting: Emergency Medicine

## 2011-09-15 ENCOUNTER — Emergency Department (HOSPITAL_COMMUNITY): Payer: Medicare Other

## 2011-09-15 ENCOUNTER — Encounter (HOSPITAL_COMMUNITY): Payer: Self-pay | Admitting: *Deleted

## 2011-09-15 DIAGNOSIS — Z7901 Long term (current) use of anticoagulants: Secondary | ICD-10-CM | POA: Diagnosis not present

## 2011-09-15 DIAGNOSIS — T1490XA Injury, unspecified, initial encounter: Secondary | ICD-10-CM | POA: Diagnosis not present

## 2011-09-15 DIAGNOSIS — S43499A Other sprain of unspecified shoulder joint, initial encounter: Secondary | ICD-10-CM

## 2011-09-15 DIAGNOSIS — Z96649 Presence of unspecified artificial hip joint: Secondary | ICD-10-CM

## 2011-09-15 DIAGNOSIS — M24819 Other specific joint derangements of unspecified shoulder, not elsewhere classified: Secondary | ICD-10-CM | POA: Diagnosis not present

## 2011-09-15 DIAGNOSIS — T85698A Other mechanical complication of other specified internal prosthetic devices, implants and grafts, initial encounter: Secondary | ICD-10-CM | POA: Diagnosis not present

## 2011-09-15 DIAGNOSIS — W010XXA Fall on same level from slipping, tripping and stumbling without subsequent striking against object, initial encounter: Secondary | ICD-10-CM | POA: Insufficient documentation

## 2011-09-15 DIAGNOSIS — T84099A Other mechanical complication of unspecified internal joint prosthesis, initial encounter: Secondary | ICD-10-CM | POA: Insufficient documentation

## 2011-09-15 DIAGNOSIS — W19XXXA Unspecified fall, initial encounter: Secondary | ICD-10-CM

## 2011-09-15 DIAGNOSIS — M25519 Pain in unspecified shoulder: Secondary | ICD-10-CM | POA: Insufficient documentation

## 2011-09-15 DIAGNOSIS — S43439A Superior glenoid labrum lesion of unspecified shoulder, initial encounter: Secondary | ICD-10-CM

## 2011-09-15 DIAGNOSIS — L989 Disorder of the skin and subcutaneous tissue, unspecified: Secondary | ICD-10-CM | POA: Diagnosis not present

## 2011-09-15 DIAGNOSIS — M25559 Pain in unspecified hip: Secondary | ICD-10-CM | POA: Diagnosis not present

## 2011-09-15 DIAGNOSIS — M25512 Pain in left shoulder: Secondary | ICD-10-CM

## 2011-09-15 DIAGNOSIS — T84098A Other mechanical complication of other internal joint prosthesis, initial encounter: Secondary | ICD-10-CM

## 2011-09-15 LAB — PROTIME-INR
INR: 1.44 (ref 0.00–1.49)
Prothrombin Time: 17.8 seconds — ABNORMAL HIGH (ref 11.6–15.2)

## 2011-09-15 MED ORDER — HYDROCODONE-ACETAMINOPHEN 5-325 MG PO TABS: ORAL_TABLET | ORAL | Status: AC

## 2011-09-15 NOTE — Discharge Instructions (Signed)
RESOURCE GUIDE  Dental Problems  Patients with Medicaid: [facility]                     [facility] [address]                                           [address] Phone:  [phone]                                                  Phone:  [phone]  If unable to pay or uninsured, contact:  [facility] or [facility] to become qualified for the adult dental clinic.  Chronic Pain Problems Contact [facility]  [phone] Patients need to be referred by their primary care doctor.  Insufficient Money for Medicine Contact [employer]:  call "211" or [facility] 271-5999.  No Primary Care Doctor Call [facility]  832-8000 Other agencies that provide inexpensive medical care    [facility]  [phone]    [facility]  [phone]    [facility]  [phone]    Women's Clinic  [phone]    [facility]  [phone]    [facility]  [phone]  Psychological Services [facility]  [phone] [facility]  [phone] [facility]   800 853-5163 (emergency services 641-4993)  Substance Abuse Resources [facility]  336-882-2125 [facility] 336-784-9470 [facility] 336-285-9073 [facility] 336-845-3988 Residential & Outpatient Substance Abuse Program  800-659-3381  Abuse/Neglect [place] Child Abuse Hotline (336) 641-3795 [place] Child Abuse Hotline 800-378-5315 (After Hours)  Emergency Shelter [facility] (336) 271-5985  Maternity Homes [facility] (336) 275-9566 [facility] (704) 372-4663  MRSA Hotline #:   832-7006    [place] Resources  [facility] [place] [facility]     [employer]                          [facility] [address] [place]                       [address]      [address]  [place]                                                [place]                            [place] Phone:  [phone]                                   Phone:  [phone]                 Phone:  [phone]  [facility] Phone:  [phone]    [facility] Child Abuse Hotline 807-379-5039 727-358-0250 (After Hours)   Take the prescription as directed; use caution with walking as it may make you drowsy.  Apply moist heat or ice to the area(s) of discomfort, for 15 minutes at a time, several times per day for the next few days.  Do not fall asleep on a heating or ice pack.  Wear the sling until you are seen in follow up by your Orthopedist.  Call your regular Orthopedic doctor on Monday to schedule a follow up appointment within the next 2 to 3 days to recheck your shoulder and your left hip prosthesis (it may have a loose component).  Return to the Emergency Department immediately if worsening.

## 2011-09-15 NOTE — ED Notes (Signed)
Patient tripped on stairs and fell onto her left shoulder.  Patient denies LOC.  Patient c/o left shoulder pain.  Pain is worse on movement.  Patient is ambulatory at triage, alert and oriented x 3.

## 2011-09-15 NOTE — ED Notes (Signed)
Patient transported to X-ray 

## 2011-09-15 NOTE — Consult Note (Signed)
Boney Bankart shoulder,probably acute.?l hip ring disassembly but no dislocation.will need sling and office f/u next week.

## 2011-09-15 NOTE — ED Provider Notes (Signed)
History     CSN: [id number]  Arrival date & time 09/15/11  1152   First MD Initiated Contact with Patient 09/15/11 1223      Chief Complaint  Patient presents with  . Fall    HPI Pt was seen at 1225.  Per pt and her son, c/o sudden onset and resolution of one episode of slip/trip and fall on one step at home PTA.  Pt's son states he was there immediately after pt fell, denies pt had LOC/hit head.  Pt states she fell onto her left shoulder and hip.  Denies hitting her head, no LOC, no AMS, no syncope, no prodromal symptoms before fall, no neck or back pain.  Pt has been ambulatory with steady gait after the fall. Only c/o left shoulder pain.  Denies tingling/numbness in extremities, no focal motor weakness.     Past Medical History  Diagnosis Date  . GERD (gastroesophageal reflux disease)   . Glaucoma   . Cancer     Past Surgical History  Procedure Date  . Total knee arthroplasty   . Total hip arthroplasty     Family History  Problem Relation Age of Onset  . Cancer Mother     History  Substance Use Topics  . Smoking status: Never Smoker   . Smokeless tobacco: Not on file  . Alcohol Use: No    Review of Systems ROS: Statement: All systems negative except as marked or noted in the HPI; Constitutional: Negative for fever and chills. ; ; Eyes: Negative for eye pain, redness and discharge. ; ; ENMT: Negative for ear pain, hoarseness, nasal congestion, sinus pressure and sore throat. ; ; Cardiovascular: Negative for chest pain, palpitations, diaphoresis, dyspnea and peripheral edema. ; ; Respiratory: Negative for cough, wheezing and stridor. ; ; Gastrointestinal: Negative for nausea, vomiting, diarrhea, abdominal pain, blood in stool, hematemesis, jaundice and rectal bleeding. . ; ; Genitourinary: Negative for dysuria, flank pain and hematuria. ; ; Musculoskeletal: +left shoulder pain. Negative for back pain and neck pain. Negative for swelling and trauma.; ; Skin: Negative for  pruritus, rash, abrasions, blisters, bruising and skin lesion.; ; Neuro: Negative for headache, lightheadedness and neck stiffness. Negative for weakness, altered level of consciousness , altered mental status, extremity weakness, paresthesias, involuntary movement, seizure and syncope.      Allergies  Dilaudid; Morphine and related; and Sulfa antibiotics  Home Medications   Current Outpatient Rx  Name Route Sig Dispense Refill  . AMLODIPINE BESYLATE 5 MG PO TABS Oral Take 5 mg by mouth daily.    . ATORVASTATIN CALCIUM 10 MG PO TABS Oral Take 10 mg by mouth daily.      . BUPROPION HCL ER (XL) 300 MG PO TB24 Oral Take 300 mg by mouth daily.    Marland Kitchen CALCIUM CARBONATE 200 MG PO CAPS Oral Take 600 mg by mouth 2 (two) times daily with a meal.    . GLUCOSAMINE-CHONDROITIN 500-400 MG PO TABS Oral Take 1 tablet by mouth 3 (three) times daily.    Marland Kitchen LATANOPROST 0.005 % OP SOLN Both Eyes Place 1 drop into both eyes at bedtime.    . WARFARIN SODIUM 2 MG PO TABS Oral Take 3 mg by mouth daily. Pt takes 1.5 tab daily    . SERTRALINE HCL 25 MG PO TABS Oral Take 25 mg by mouth daily.        BP 138/71  Pulse 81  Temp(Src) 98.3 F (36.8 C) (Oral)  Resp 18  SpO2 94%  Physical Exam 1230: Physical examination:  Nursing notes reviewed; Vital signs and O2 SAT reviewed;  Constitutional: Well developed, Well nourished, Well hydrated, In no acute distress; Head:  Normocephalic, atraumatic; Eyes: EOMI, PERRL, No scleral icterus; ENMT: Mouth and pharynx normal, Mucous membranes moist; Neck: Supple, Full range of motion, No lymphadenopathy; Cardiovascular: Regular rate and rhythm, No murmur or gallop; Respiratory: Breath sounds clear & equal bilaterally, No rales, rhonchi, wheezes, Normal respiratory effort/excursion; Chest: Nontender, Movement normal; Abdomen: Soft, Nontender, Nondistended, Normal bowel sounds; Genitourinary: No CVA tenderness; Spine:  No midline CS, TS, LS tenderness.; Extremities:  Pt is able to  lift bilat extended LE's off stretcher without discomfort. Left shoulder w/decreased ROM due to increasing pain.  +mild TTP entire joint.  NT left clavicle, scapula NT, proximal humerus NT, biceps tendon NT over bicipital groove.  Motor strength at shoulder normal.  Sensation intact over deltoid region, distal NMS intact with left hand having intact sensation and strength in the distribution of the median, radial, and ulnar nerve function.  Strong radial pulse.  +FROM left elbow with intact motor strength biceps and triceps muscles to resistance. Pulses normal, pelvis stable, no deformity, No tenderness, No edema, No calf edema or asymmetry.; Neuro: AA&Ox3, Major CN grossly intact. No facial droop, speech clear. No gross focal motor or sensory deficits in extremities.; Skin: Color normal, Warm, Dry, no rash.    ED Course  Procedures    MDM  MDM Reviewed: nursing note and vitals Interpretation: labs and x-ray   Results for orders placed during the hospital encounter of 09/15/11  PROTIME-INR      Component Value Range   Prothrombin Time 17.8 (*) 11.6 - 15.2 (seconds)   INR 1.44  0.00 - 1.49    Dg Hip Complete Left 09/15/2011  *RADIOLOGY REPORT*  Clinical Data: Larey Seat.  Left hip pain.  LEFT HIP - COMPLETE 2+ VIEW  Comparison: None  Findings: There are bilateral total hip arthroplasties.  No dislocation or periprosthetic fracture.  The pubic symphysis and SI joints are intact.  No pelvic fractures.  On the left side there is a metallic ring which I suspect has come loose from the acetabular cup.  Heterotopic ossification is noted bilaterally.  IMPRESSION:  1.  No fracture or dislocation. 2.  Acetabular ring on the left it is separate from the acetabular component and may suggest a loose Insert.  Original Report Authenticated By: P. Loralie Champagne, M.D.   Dg Shoulder Left 09/15/2011  *RADIOLOGY REPORT*  Clinical Data: Larey Seat.  Left shoulder pain.  LEFT SHOULDER - 2+ VIEW  Comparison: None  Findings: There  is a curvilinear bony density projecting off of the inferior glenoid.  This is likely a bony Bankart fracture and although it does not appear to be acute was not present on a prior chest x-ray from 2012.  Recommend correlation with any history of recent dislocations.  The [facility] joint is intact.  The left lung apex is clear.  IMPRESSION:  1.  Curvilinear bony density projecting off of the inferior glenoid is likely a bony Bankart fracture of uncertain age. 2.  No dislocation.  Original Report Authenticated By: P. Loralie Champagne, M.D.      1440: T/C to Ortho Dr. Madelon Lips (on call for Dr. [name]), case discussed, including:  HPI, pertinent PM/SHx, VS/PE, dx testing, ED course and treatment:  Is on his way to the ED and will see XR's and pt.   1500:  Ortho MD has viewed XR's and eval pt.  Requests to place pt's left shoulder in sling, rx pain control, f/u with Dr. [name] this week.  Pt and son want to go home now.  States she has taken vicodin previously and is requesting rx "just in case."  Dx testing d/w pt and family.  Questions answered.  Verb understanding, agreeable to d/c home with outpt f/u.      Laray Anger, DO 09/17/11 1128

## 2011-09-15 NOTE — ED Notes (Signed)
Patient given discharge instructions, information, prescriptions, and diet order. Patient states that they adequately understand discharge information given and to return to ED if symptoms return or worsen.     

## 2011-09-20 ENCOUNTER — Other Ambulatory Visit: Payer: Medicare Other

## 2011-09-21 ENCOUNTER — Ambulatory Visit
Admission: RE | Admit: 2011-09-21 | Discharge: 2011-09-21 | Disposition: A | Discharge status: Home / Self Care | Payer: Medicare Other | Source: Ambulatory Visit | Attending: Physician Assistant | Admitting: Physician Assistant

## 2011-09-21 DIAGNOSIS — N644 Mastodynia: Secondary | ICD-10-CM

## 2011-09-21 DIAGNOSIS — Z853 Personal history of malignant neoplasm of breast: Secondary | ICD-10-CM | POA: Diagnosis not present

## 2011-10-09 DIAGNOSIS — E782 Mixed hyperlipidemia: Secondary | ICD-10-CM | POA: Diagnosis not present

## 2011-10-09 DIAGNOSIS — I6789 Other cerebrovascular disease: Secondary | ICD-10-CM | POA: Diagnosis not present

## 2011-10-09 DIAGNOSIS — Z7901 Long term (current) use of anticoagulants: Secondary | ICD-10-CM | POA: Diagnosis not present

## 2011-10-09 DIAGNOSIS — I4891 Unspecified atrial fibrillation: Secondary | ICD-10-CM | POA: Diagnosis not present

## 2011-10-10 DIAGNOSIS — M25669 Stiffness of unspecified knee, not elsewhere classified: Secondary | ICD-10-CM | POA: Diagnosis not present

## 2011-10-10 DIAGNOSIS — S42143A Displaced fracture of glenoid cavity of scapula, unspecified shoulder, initial encounter for closed fracture: Secondary | ICD-10-CM | POA: Diagnosis not present

## 2011-10-10 DIAGNOSIS — M25519 Pain in unspecified shoulder: Secondary | ICD-10-CM | POA: Diagnosis not present

## 2011-10-11 DIAGNOSIS — M25669 Stiffness of unspecified knee, not elsewhere classified: Secondary | ICD-10-CM | POA: Diagnosis not present

## 2011-10-11 DIAGNOSIS — M25519 Pain in unspecified shoulder: Secondary | ICD-10-CM | POA: Diagnosis not present

## 2011-10-11 DIAGNOSIS — S42143A Displaced fracture of glenoid cavity of scapula, unspecified shoulder, initial encounter for closed fracture: Secondary | ICD-10-CM | POA: Diagnosis not present

## 2011-10-15 DIAGNOSIS — F329 Major depressive disorder, single episode, unspecified: Secondary | ICD-10-CM | POA: Diagnosis not present

## 2011-10-15 DIAGNOSIS — Z79899 Other long term (current) drug therapy: Secondary | ICD-10-CM | POA: Diagnosis not present

## 2011-10-15 DIAGNOSIS — I10 Essential (primary) hypertension: Secondary | ICD-10-CM | POA: Diagnosis not present

## 2011-10-15 DIAGNOSIS — E7800 Pure hypercholesterolemia, unspecified: Secondary | ICD-10-CM | POA: Diagnosis not present

## 2011-10-15 DIAGNOSIS — M159 Polyosteoarthritis, unspecified: Secondary | ICD-10-CM | POA: Diagnosis not present

## 2011-10-15 DIAGNOSIS — E559 Vitamin D deficiency, unspecified: Secondary | ICD-10-CM | POA: Diagnosis not present

## 2011-10-15 DIAGNOSIS — M899 Disorder of bone, unspecified: Secondary | ICD-10-CM | POA: Diagnosis not present

## 2011-10-15 DIAGNOSIS — G2589 Other specified extrapyramidal and movement disorders: Secondary | ICD-10-CM | POA: Diagnosis not present

## 2011-10-18 DIAGNOSIS — Z7901 Long term (current) use of anticoagulants: Secondary | ICD-10-CM | POA: Diagnosis not present

## 2011-10-18 DIAGNOSIS — M25519 Pain in unspecified shoulder: Secondary | ICD-10-CM | POA: Diagnosis not present

## 2011-10-18 DIAGNOSIS — M25669 Stiffness of unspecified knee, not elsewhere classified: Secondary | ICD-10-CM | POA: Diagnosis not present

## 2011-10-18 DIAGNOSIS — S42153A Displaced fracture of neck of scapula, unspecified shoulder, initial encounter for closed fracture: Secondary | ICD-10-CM | POA: Diagnosis not present

## 2011-10-18 DIAGNOSIS — I6789 Other cerebrovascular disease: Secondary | ICD-10-CM | POA: Diagnosis not present

## 2011-10-18 DIAGNOSIS — S42143A Displaced fracture of glenoid cavity of scapula, unspecified shoulder, initial encounter for closed fracture: Secondary | ICD-10-CM | POA: Diagnosis not present

## 2011-10-22 DIAGNOSIS — M25669 Stiffness of unspecified knee, not elsewhere classified: Secondary | ICD-10-CM | POA: Diagnosis not present

## 2011-10-22 DIAGNOSIS — S42143A Displaced fracture of glenoid cavity of scapula, unspecified shoulder, initial encounter for closed fracture: Secondary | ICD-10-CM | POA: Diagnosis not present

## 2011-10-22 DIAGNOSIS — M25519 Pain in unspecified shoulder: Secondary | ICD-10-CM | POA: Diagnosis not present

## 2011-10-23 DIAGNOSIS — S42143A Displaced fracture of glenoid cavity of scapula, unspecified shoulder, initial encounter for closed fracture: Secondary | ICD-10-CM | POA: Diagnosis not present

## 2011-10-23 DIAGNOSIS — M25669 Stiffness of unspecified knee, not elsewhere classified: Secondary | ICD-10-CM | POA: Diagnosis not present

## 2011-10-23 DIAGNOSIS — M25519 Pain in unspecified shoulder: Secondary | ICD-10-CM | POA: Diagnosis not present

## 2011-10-25 DIAGNOSIS — M25669 Stiffness of unspecified knee, not elsewhere classified: Secondary | ICD-10-CM | POA: Diagnosis not present

## 2011-10-25 DIAGNOSIS — M25519 Pain in unspecified shoulder: Secondary | ICD-10-CM | POA: Diagnosis not present

## 2011-10-25 DIAGNOSIS — S42143A Displaced fracture of glenoid cavity of scapula, unspecified shoulder, initial encounter for closed fracture: Secondary | ICD-10-CM | POA: Diagnosis not present

## 2011-10-29 DIAGNOSIS — M25669 Stiffness of unspecified knee, not elsewhere classified: Secondary | ICD-10-CM | POA: Diagnosis not present

## 2011-10-29 DIAGNOSIS — M25519 Pain in unspecified shoulder: Secondary | ICD-10-CM | POA: Diagnosis not present

## 2011-10-29 DIAGNOSIS — S42143A Displaced fracture of glenoid cavity of scapula, unspecified shoulder, initial encounter for closed fracture: Secondary | ICD-10-CM | POA: Diagnosis not present

## 2011-10-29 DIAGNOSIS — S42153A Displaced fracture of neck of scapula, unspecified shoulder, initial encounter for closed fracture: Secondary | ICD-10-CM | POA: Diagnosis not present

## 2011-11-01 DIAGNOSIS — S42143A Displaced fracture of glenoid cavity of scapula, unspecified shoulder, initial encounter for closed fracture: Secondary | ICD-10-CM | POA: Diagnosis not present

## 2011-11-01 DIAGNOSIS — M25669 Stiffness of unspecified knee, not elsewhere classified: Secondary | ICD-10-CM | POA: Diagnosis not present

## 2011-11-01 DIAGNOSIS — S42153A Displaced fracture of neck of scapula, unspecified shoulder, initial encounter for closed fracture: Secondary | ICD-10-CM | POA: Diagnosis not present

## 2011-11-01 DIAGNOSIS — I6789 Other cerebrovascular disease: Secondary | ICD-10-CM | POA: Diagnosis not present

## 2011-11-01 DIAGNOSIS — M25519 Pain in unspecified shoulder: Secondary | ICD-10-CM | POA: Diagnosis not present

## 2011-11-01 DIAGNOSIS — Z7901 Long term (current) use of anticoagulants: Secondary | ICD-10-CM | POA: Diagnosis not present

## 2011-11-05 DIAGNOSIS — M25519 Pain in unspecified shoulder: Secondary | ICD-10-CM | POA: Diagnosis not present

## 2011-11-05 DIAGNOSIS — S42143A Displaced fracture of glenoid cavity of scapula, unspecified shoulder, initial encounter for closed fracture: Secondary | ICD-10-CM | POA: Diagnosis not present

## 2011-11-05 DIAGNOSIS — M25669 Stiffness of unspecified knee, not elsewhere classified: Secondary | ICD-10-CM | POA: Diagnosis not present

## 2011-11-28 DIAGNOSIS — Z7901 Long term (current) use of anticoagulants: Secondary | ICD-10-CM | POA: Diagnosis not present

## 2011-11-28 DIAGNOSIS — I6789 Other cerebrovascular disease: Secondary | ICD-10-CM | POA: Diagnosis not present

## 2011-12-06 DIAGNOSIS — S42109A Fracture of unspecified part of scapula, unspecified shoulder, initial encounter for closed fracture: Secondary | ICD-10-CM | POA: Diagnosis not present

## 2011-12-06 DIAGNOSIS — M25519 Pain in unspecified shoulder: Secondary | ICD-10-CM | POA: Diagnosis not present

## 2011-12-10 ENCOUNTER — Encounter (INDEPENDENT_AMBULATORY_CARE_PROVIDER_SITE_OTHER): Payer: Self-pay | Admitting: General Surgery

## 2011-12-26 DIAGNOSIS — I4891 Unspecified atrial fibrillation: Secondary | ICD-10-CM | POA: Diagnosis not present

## 2011-12-26 DIAGNOSIS — Z7901 Long term (current) use of anticoagulants: Secondary | ICD-10-CM | POA: Diagnosis not present

## 2012-02-06 ENCOUNTER — Other Ambulatory Visit (INDEPENDENT_AMBULATORY_CARE_PROVIDER_SITE_OTHER): Payer: Self-pay | Admitting: General Surgery

## 2012-02-06 DIAGNOSIS — Z9889 Other specified postprocedural states: Secondary | ICD-10-CM

## 2012-02-06 DIAGNOSIS — Z1231 Encounter for screening mammogram for malignant neoplasm of breast: Secondary | ICD-10-CM

## 2012-02-10 DIAGNOSIS — IMO0002 Reserved for concepts with insufficient information to code with codable children: Secondary | ICD-10-CM | POA: Diagnosis not present

## 2012-02-12 DIAGNOSIS — M25569 Pain in unspecified knee: Secondary | ICD-10-CM | POA: Diagnosis not present

## 2012-02-12 DIAGNOSIS — M25559 Pain in unspecified hip: Secondary | ICD-10-CM | POA: Diagnosis not present

## 2012-02-18 ENCOUNTER — Ambulatory Visit: Payer: Medicare Other

## 2012-02-19 DIAGNOSIS — H543 Unqualified visual loss, both eyes: Secondary | ICD-10-CM | POA: Diagnosis not present

## 2012-02-19 DIAGNOSIS — R109 Unspecified abdominal pain: Secondary | ICD-10-CM | POA: Diagnosis not present

## 2012-02-19 DIAGNOSIS — M169 Osteoarthritis of hip, unspecified: Secondary | ICD-10-CM | POA: Diagnosis not present

## 2012-02-19 DIAGNOSIS — R262 Difficulty in walking, not elsewhere classified: Secondary | ICD-10-CM | POA: Diagnosis not present

## 2012-02-19 DIAGNOSIS — M25559 Pain in unspecified hip: Secondary | ICD-10-CM | POA: Diagnosis not present

## 2012-02-19 DIAGNOSIS — F329 Major depressive disorder, single episode, unspecified: Secondary | ICD-10-CM | POA: Diagnosis not present

## 2012-02-20 DIAGNOSIS — R300 Dysuria: Secondary | ICD-10-CM | POA: Diagnosis not present

## 2012-02-20 DIAGNOSIS — L0291 Cutaneous abscess, unspecified: Secondary | ICD-10-CM | POA: Diagnosis not present

## 2012-02-20 DIAGNOSIS — N3000 Acute cystitis without hematuria: Secondary | ICD-10-CM | POA: Diagnosis not present

## 2012-02-20 DIAGNOSIS — L0390 Cellulitis, unspecified: Secondary | ICD-10-CM | POA: Diagnosis not present

## 2012-02-22 DIAGNOSIS — R262 Difficulty in walking, not elsewhere classified: Secondary | ICD-10-CM | POA: Diagnosis not present

## 2012-02-22 DIAGNOSIS — R109 Unspecified abdominal pain: Secondary | ICD-10-CM | POA: Diagnosis not present

## 2012-02-22 DIAGNOSIS — F329 Major depressive disorder, single episode, unspecified: Secondary | ICD-10-CM | POA: Diagnosis not present

## 2012-02-22 DIAGNOSIS — H543 Unqualified visual loss, both eyes: Secondary | ICD-10-CM | POA: Diagnosis not present

## 2012-02-22 DIAGNOSIS — M169 Osteoarthritis of hip, unspecified: Secondary | ICD-10-CM | POA: Diagnosis not present

## 2012-02-26 DIAGNOSIS — F329 Major depressive disorder, single episode, unspecified: Secondary | ICD-10-CM | POA: Diagnosis not present

## 2012-02-26 DIAGNOSIS — H543 Unqualified visual loss, both eyes: Secondary | ICD-10-CM | POA: Diagnosis not present

## 2012-02-26 DIAGNOSIS — R262 Difficulty in walking, not elsewhere classified: Secondary | ICD-10-CM | POA: Diagnosis not present

## 2012-02-26 DIAGNOSIS — M169 Osteoarthritis of hip, unspecified: Secondary | ICD-10-CM | POA: Diagnosis not present

## 2012-02-26 DIAGNOSIS — R109 Unspecified abdominal pain: Secondary | ICD-10-CM | POA: Diagnosis not present

## 2012-02-27 DIAGNOSIS — M79609 Pain in unspecified limb: Secondary | ICD-10-CM | POA: Diagnosis not present

## 2012-02-27 DIAGNOSIS — L0390 Cellulitis, unspecified: Secondary | ICD-10-CM | POA: Diagnosis not present

## 2012-02-27 DIAGNOSIS — R609 Edema, unspecified: Secondary | ICD-10-CM | POA: Diagnosis not present

## 2012-02-29 DIAGNOSIS — M169 Osteoarthritis of hip, unspecified: Secondary | ICD-10-CM | POA: Diagnosis not present

## 2012-02-29 DIAGNOSIS — R262 Difficulty in walking, not elsewhere classified: Secondary | ICD-10-CM | POA: Diagnosis not present

## 2012-02-29 DIAGNOSIS — H543 Unqualified visual loss, both eyes: Secondary | ICD-10-CM | POA: Diagnosis not present

## 2012-02-29 DIAGNOSIS — R109 Unspecified abdominal pain: Secondary | ICD-10-CM | POA: Diagnosis not present

## 2012-02-29 DIAGNOSIS — F329 Major depressive disorder, single episode, unspecified: Secondary | ICD-10-CM | POA: Diagnosis not present

## 2012-03-03 DIAGNOSIS — F329 Major depressive disorder, single episode, unspecified: Secondary | ICD-10-CM | POA: Diagnosis not present

## 2012-03-03 DIAGNOSIS — R109 Unspecified abdominal pain: Secondary | ICD-10-CM | POA: Diagnosis not present

## 2012-03-03 DIAGNOSIS — H543 Unqualified visual loss, both eyes: Secondary | ICD-10-CM | POA: Diagnosis not present

## 2012-03-03 DIAGNOSIS — M169 Osteoarthritis of hip, unspecified: Secondary | ICD-10-CM | POA: Diagnosis not present

## 2012-03-03 DIAGNOSIS — R262 Difficulty in walking, not elsewhere classified: Secondary | ICD-10-CM | POA: Diagnosis not present

## 2012-03-04 DIAGNOSIS — R109 Unspecified abdominal pain: Secondary | ICD-10-CM | POA: Diagnosis not present

## 2012-03-04 DIAGNOSIS — R262 Difficulty in walking, not elsewhere classified: Secondary | ICD-10-CM | POA: Diagnosis not present

## 2012-03-04 DIAGNOSIS — H543 Unqualified visual loss, both eyes: Secondary | ICD-10-CM | POA: Diagnosis not present

## 2012-03-04 DIAGNOSIS — M169 Osteoarthritis of hip, unspecified: Secondary | ICD-10-CM | POA: Diagnosis not present

## 2012-03-04 DIAGNOSIS — F329 Major depressive disorder, single episode, unspecified: Secondary | ICD-10-CM | POA: Diagnosis not present

## 2012-03-05 DIAGNOSIS — L0291 Cutaneous abscess, unspecified: Secondary | ICD-10-CM | POA: Diagnosis not present

## 2012-03-05 DIAGNOSIS — R300 Dysuria: Secondary | ICD-10-CM | POA: Diagnosis not present

## 2012-03-05 DIAGNOSIS — R609 Edema, unspecified: Secondary | ICD-10-CM | POA: Diagnosis not present

## 2012-03-07 DIAGNOSIS — R262 Difficulty in walking, not elsewhere classified: Secondary | ICD-10-CM | POA: Diagnosis not present

## 2012-03-07 DIAGNOSIS — F329 Major depressive disorder, single episode, unspecified: Secondary | ICD-10-CM | POA: Diagnosis not present

## 2012-03-07 DIAGNOSIS — R109 Unspecified abdominal pain: Secondary | ICD-10-CM | POA: Diagnosis not present

## 2012-03-07 DIAGNOSIS — H543 Unqualified visual loss, both eyes: Secondary | ICD-10-CM | POA: Diagnosis not present

## 2012-03-07 DIAGNOSIS — M169 Osteoarthritis of hip, unspecified: Secondary | ICD-10-CM | POA: Diagnosis not present

## 2012-03-10 DIAGNOSIS — H543 Unqualified visual loss, both eyes: Secondary | ICD-10-CM | POA: Diagnosis not present

## 2012-03-10 DIAGNOSIS — R109 Unspecified abdominal pain: Secondary | ICD-10-CM | POA: Diagnosis not present

## 2012-03-10 DIAGNOSIS — R262 Difficulty in walking, not elsewhere classified: Secondary | ICD-10-CM | POA: Diagnosis not present

## 2012-03-10 DIAGNOSIS — M169 Osteoarthritis of hip, unspecified: Secondary | ICD-10-CM | POA: Diagnosis not present

## 2012-03-10 DIAGNOSIS — F329 Major depressive disorder, single episode, unspecified: Secondary | ICD-10-CM | POA: Diagnosis not present

## 2012-03-11 DIAGNOSIS — R262 Difficulty in walking, not elsewhere classified: Secondary | ICD-10-CM | POA: Diagnosis not present

## 2012-03-11 DIAGNOSIS — M169 Osteoarthritis of hip, unspecified: Secondary | ICD-10-CM | POA: Diagnosis not present

## 2012-03-11 DIAGNOSIS — H543 Unqualified visual loss, both eyes: Secondary | ICD-10-CM | POA: Diagnosis not present

## 2012-03-11 DIAGNOSIS — F329 Major depressive disorder, single episode, unspecified: Secondary | ICD-10-CM | POA: Diagnosis not present

## 2012-03-11 DIAGNOSIS — R109 Unspecified abdominal pain: Secondary | ICD-10-CM | POA: Diagnosis not present

## 2012-03-12 DIAGNOSIS — F329 Major depressive disorder, single episode, unspecified: Secondary | ICD-10-CM | POA: Diagnosis not present

## 2012-03-12 DIAGNOSIS — M169 Osteoarthritis of hip, unspecified: Secondary | ICD-10-CM | POA: Diagnosis not present

## 2012-03-12 DIAGNOSIS — R262 Difficulty in walking, not elsewhere classified: Secondary | ICD-10-CM | POA: Diagnosis not present

## 2012-03-12 DIAGNOSIS — R109 Unspecified abdominal pain: Secondary | ICD-10-CM | POA: Diagnosis not present

## 2012-03-12 DIAGNOSIS — H543 Unqualified visual loss, both eyes: Secondary | ICD-10-CM | POA: Diagnosis not present

## 2012-03-13 DIAGNOSIS — F329 Major depressive disorder, single episode, unspecified: Secondary | ICD-10-CM | POA: Diagnosis not present

## 2012-03-13 DIAGNOSIS — M169 Osteoarthritis of hip, unspecified: Secondary | ICD-10-CM | POA: Diagnosis not present

## 2012-03-13 DIAGNOSIS — R262 Difficulty in walking, not elsewhere classified: Secondary | ICD-10-CM | POA: Diagnosis not present

## 2012-03-13 DIAGNOSIS — R109 Unspecified abdominal pain: Secondary | ICD-10-CM | POA: Diagnosis not present

## 2012-03-13 DIAGNOSIS — H543 Unqualified visual loss, both eyes: Secondary | ICD-10-CM | POA: Diagnosis not present

## 2012-03-14 DIAGNOSIS — R109 Unspecified abdominal pain: Secondary | ICD-10-CM | POA: Diagnosis not present

## 2012-03-14 DIAGNOSIS — M169 Osteoarthritis of hip, unspecified: Secondary | ICD-10-CM | POA: Diagnosis not present

## 2012-03-14 DIAGNOSIS — R262 Difficulty in walking, not elsewhere classified: Secondary | ICD-10-CM | POA: Diagnosis not present

## 2012-03-14 DIAGNOSIS — F329 Major depressive disorder, single episode, unspecified: Secondary | ICD-10-CM | POA: Diagnosis not present

## 2012-03-14 DIAGNOSIS — H543 Unqualified visual loss, both eyes: Secondary | ICD-10-CM | POA: Diagnosis not present

## 2012-03-17 ENCOUNTER — Other Ambulatory Visit: Payer: Self-pay | Admitting: Orthopedic Surgery

## 2012-03-17 DIAGNOSIS — M25552 Pain in left hip: Secondary | ICD-10-CM

## 2012-03-17 DIAGNOSIS — M169 Osteoarthritis of hip, unspecified: Secondary | ICD-10-CM | POA: Diagnosis not present

## 2012-03-17 DIAGNOSIS — R109 Unspecified abdominal pain: Secondary | ICD-10-CM | POA: Diagnosis not present

## 2012-03-17 DIAGNOSIS — F329 Major depressive disorder, single episode, unspecified: Secondary | ICD-10-CM | POA: Diagnosis not present

## 2012-03-17 DIAGNOSIS — R262 Difficulty in walking, not elsewhere classified: Secondary | ICD-10-CM | POA: Diagnosis not present

## 2012-03-17 DIAGNOSIS — IMO0002 Reserved for concepts with insufficient information to code with codable children: Secondary | ICD-10-CM | POA: Diagnosis not present

## 2012-03-17 DIAGNOSIS — H543 Unqualified visual loss, both eyes: Secondary | ICD-10-CM | POA: Diagnosis not present

## 2012-03-20 DIAGNOSIS — F329 Major depressive disorder, single episode, unspecified: Secondary | ICD-10-CM | POA: Diagnosis not present

## 2012-03-20 DIAGNOSIS — M169 Osteoarthritis of hip, unspecified: Secondary | ICD-10-CM | POA: Diagnosis not present

## 2012-03-20 DIAGNOSIS — H543 Unqualified visual loss, both eyes: Secondary | ICD-10-CM | POA: Diagnosis not present

## 2012-03-20 DIAGNOSIS — R262 Difficulty in walking, not elsewhere classified: Secondary | ICD-10-CM | POA: Diagnosis not present

## 2012-03-20 DIAGNOSIS — R109 Unspecified abdominal pain: Secondary | ICD-10-CM | POA: Diagnosis not present

## 2012-03-24 ENCOUNTER — Ambulatory Visit
Admission: RE | Admit: 2012-03-24 | Discharge: 2012-03-24 | Disposition: A | Discharge status: Home / Self Care | Payer: Medicare Other | Source: Ambulatory Visit | Attending: Orthopedic Surgery | Admitting: Orthopedic Surgery

## 2012-03-24 ENCOUNTER — Other Ambulatory Visit: Payer: Self-pay | Admitting: Radiology

## 2012-03-24 DIAGNOSIS — Z7901 Long term (current) use of anticoagulants: Secondary | ICD-10-CM | POA: Diagnosis not present

## 2012-03-24 DIAGNOSIS — M25552 Pain in left hip: Secondary | ICD-10-CM

## 2012-03-24 DIAGNOSIS — I4891 Unspecified atrial fibrillation: Secondary | ICD-10-CM | POA: Diagnosis not present

## 2012-03-24 DIAGNOSIS — R1030 Lower abdominal pain, unspecified: Secondary | ICD-10-CM

## 2012-03-24 DIAGNOSIS — M25559 Pain in unspecified hip: Secondary | ICD-10-CM | POA: Diagnosis not present

## 2012-03-24 DIAGNOSIS — R109 Unspecified abdominal pain: Secondary | ICD-10-CM | POA: Diagnosis not present

## 2012-03-24 LAB — CELL COUNT + DIFF, W/O CRYST-SYNVL FLD
Eosinophils-Synovial: 0 % (ref 0–1)
Lymphocytes-Synovial Fld: 7 % (ref 0–20)
Neutrophil, Synovial: 92 % — ABNORMAL HIGH (ref 0–25)

## 2012-03-27 ENCOUNTER — Encounter (HOSPITAL_COMMUNITY): Payer: Self-pay | Admitting: Pharmacy Technician

## 2012-03-27 ENCOUNTER — Other Ambulatory Visit: Payer: Self-pay | Admitting: Orthopedic Surgery

## 2012-03-27 DIAGNOSIS — M25559 Pain in unspecified hip: Secondary | ICD-10-CM | POA: Diagnosis not present

## 2012-03-28 ENCOUNTER — Ambulatory Visit (HOSPITAL_COMMUNITY)
Admission: RE | Admit: 2012-03-28 | Discharge: 2012-03-28 | Disposition: A | Discharge status: Home / Self Care | Payer: Medicare Other | Source: Ambulatory Visit | Attending: Orthopedic Surgery | Admitting: Orthopedic Surgery

## 2012-03-28 ENCOUNTER — Encounter (HOSPITAL_COMMUNITY): Payer: Self-pay

## 2012-03-28 ENCOUNTER — Encounter (HOSPITAL_COMMUNITY)
Admission: RE | Admit: 2012-03-28 | Discharge: 2012-03-28 | Disposition: A | Discharge status: Home / Self Care | Payer: Medicare Other | Source: Ambulatory Visit | Attending: Orthopedic Surgery | Admitting: Orthopedic Surgery

## 2012-03-28 DIAGNOSIS — Z01812 Encounter for preprocedural laboratory examination: Secondary | ICD-10-CM | POA: Diagnosis not present

## 2012-03-28 DIAGNOSIS — Z853 Personal history of malignant neoplasm of breast: Secondary | ICD-10-CM | POA: Insufficient documentation

## 2012-03-28 DIAGNOSIS — Z01818 Encounter for other preprocedural examination: Secondary | ICD-10-CM | POA: Diagnosis not present

## 2012-03-28 DIAGNOSIS — M4120 Other idiopathic scoliosis, site unspecified: Secondary | ICD-10-CM | POA: Insufficient documentation

## 2012-03-28 DIAGNOSIS — I10 Essential (primary) hypertension: Secondary | ICD-10-CM | POA: Diagnosis not present

## 2012-03-28 DIAGNOSIS — J9819 Other pulmonary collapse: Secondary | ICD-10-CM | POA: Diagnosis not present

## 2012-03-28 DIAGNOSIS — Z01811 Encounter for preprocedural respiratory examination: Secondary | ICD-10-CM | POA: Diagnosis not present

## 2012-03-28 HISTORY — DX: Essential (primary) hypertension: I10

## 2012-03-28 HISTORY — DX: Depression, unspecified: F32.A

## 2012-03-28 HISTORY — DX: Major depressive disorder, single episode, unspecified: F32.9

## 2012-03-28 LAB — SURGICAL PCR SCREEN
MRSA, PCR: NEGATIVE
Staphylococcus aureus: NEGATIVE

## 2012-03-28 LAB — CBC WITH DIFFERENTIAL/PLATELET
Eosinophils Absolute: 0.2 10*3/uL (ref 0.0–0.7)
HCT: 35 % — ABNORMAL LOW (ref 36.0–46.0)
Hemoglobin: 11.4 g/dL — ABNORMAL LOW (ref 12.0–15.0)
Lymphs Abs: 1.4 10*3/uL (ref 0.7–4.0)
MCH: 28 pg (ref 26.0–34.0)
Monocytes Absolute: 0.7 10*3/uL (ref 0.1–1.0)
Monocytes Relative: 7 % (ref 3–12)
Neutrophils Relative %: 77 % (ref 43–77)
RBC: 4.07 MIL/uL (ref 3.87–5.11)

## 2012-03-28 LAB — COMPREHENSIVE METABOLIC PANEL
Alkaline Phosphatase: 76 U/L (ref 39–117)
BUN: 13 mg/dL (ref 6–23)
Chloride: 106 mEq/L (ref 96–112)
Creatinine, Ser: 0.7 mg/dL (ref 0.50–1.10)
GFR calc Af Amer: >90 mL/min (ref >90)
Glucose, Bld: 92 mg/dL (ref 70–99)
Potassium: 4.2 mEq/L (ref 3.5–5.1)
Total Bilirubin: 0.2 mg/dL — ABNORMAL LOW (ref 0.3–1.2)
Total Protein: 7.4 g/dL (ref 6.0–8.3)

## 2012-03-28 LAB — BODY FLUID CULTURE

## 2012-03-28 LAB — URINE MICROSCOPIC-ADD ON

## 2012-03-28 LAB — PROTIME-INR: Prothrombin Time: 19.6 seconds — ABNORMAL HIGH (ref 11.6–15.2)

## 2012-03-28 LAB — URINALYSIS, ROUTINE W REFLEX MICROSCOPIC
Bilirubin Urine: NEGATIVE
Protein, ur: NEGATIVE mg/dL
Urobilinogen, UA: 1 mg/dL (ref 0.0–1.0)

## 2012-03-28 MED ORDER — CHLORHEXIDINE GLUCONATE 4 % EX LIQD: 60.0000 mL | Freq: Once | CUTANEOUS | Status: DC

## 2012-03-28 NOTE — Pre-Procedure Instructions (Signed)
20 Renee Adams  03/28/2012   Your procedure is scheduled on:  Monday March 31, 2012 at 1130 AM  Report to [facility] at 0930 AM.  Call this number if you have problems the morning of surgery: [phone]   Remember:   Do not eat food or drink:After Midnight.Sunday     Take these medicines the morning of surgery with A SIP OF WATER: Norvasc, Wellbutrin , and Zoloft   Do not wear jewelry, make-up or nail polish.  Do not wear lotions, powders, or perfumes. You may wear deodorant.  Do not shave 48 hours prior to surgery.   Do not bring valuables to the hospital.  Contacts, dentures or bridgework may not be worn into surgery.  Leave suitcase in the car. After surgery it may be brought to your room.  For patients admitted to the hospital, checkout time is 11:00 AM the day of discharge.   Patients discharged the day of surgery will not be allowed to drive home.    Special Instructions: Incentive Spirometry - Practice and bring it with you on the day of surgery. Shower using CHG 2 nights before surgery and the night before surgery.  If you shower the day of surgery use CHG.  Use special wash - you have one bottle of CHG for all showers.  You should use approximately 1/3 of the bottle for each shower.   Please read over the following fact sheets that you were given: Pain Booklet, Coughing and Deep Breathing, MRSA Information and Surgical Site Infection Prevention

## 2012-03-28 NOTE — Progress Notes (Signed)
I called Dr Gypsy Balsam to report labs, PT /INR, PTT, he said he is not able to discuss it at this time.  Information left for St on call nurse to follow up.

## 2012-03-30 LAB — URINE CULTURE

## 2012-03-30 MED ORDER — ACETAMINOPHEN 10 MG/ML IV SOLN
1000.0000 mg | Freq: Four times a day (QID) | INTRAVENOUS | Status: DC
  Administered 2012-03-31: 1000 mg via INTRAVENOUS
  Filled 2012-03-30 (×3): qty 100

## 2012-03-30 MED ORDER — SODIUM CHLORIDE 0.9 % IV SOLN: INTRAVENOUS | Status: DC

## 2012-03-31 ENCOUNTER — Encounter (HOSPITAL_COMMUNITY): Payer: Self-pay | Admitting: *Deleted

## 2012-03-31 ENCOUNTER — Inpatient Hospital Stay (HOSPITAL_COMMUNITY)
Admission: RE | Admit: 2012-03-31 | Discharge: 2012-04-04 | DRG: 467 | Disposition: A | Discharge status: Home Health | Payer: Medicare Other | Source: Ambulatory Visit | Attending: Orthopedic Surgery | Admitting: Orthopedic Surgery

## 2012-03-31 ENCOUNTER — Encounter (HOSPITAL_COMMUNITY)
Admission: RE | Disposition: A | Discharge status: Home Health | Payer: Self-pay | Source: Ambulatory Visit | Attending: Orthopedic Surgery

## 2012-03-31 ENCOUNTER — Ambulatory Visit (HOSPITAL_COMMUNITY): Payer: Medicare Other | Admitting: Anesthesiology

## 2012-03-31 ENCOUNTER — Encounter (HOSPITAL_COMMUNITY): Payer: Self-pay | Admitting: Anesthesiology

## 2012-03-31 DIAGNOSIS — H409 Unspecified glaucoma: Secondary | ICD-10-CM | POA: Diagnosis present

## 2012-03-31 DIAGNOSIS — M009 Pyogenic arthritis, unspecified: Secondary | ICD-10-CM | POA: Diagnosis not present

## 2012-03-31 DIAGNOSIS — Z96649 Presence of unspecified artificial hip joint: Secondary | ICD-10-CM | POA: Diagnosis not present

## 2012-03-31 DIAGNOSIS — D62 Acute posthemorrhagic anemia: Secondary | ICD-10-CM | POA: Diagnosis not present

## 2012-03-31 DIAGNOSIS — T8450XA Infection and inflammatory reaction due to unspecified internal joint prosthesis, initial encounter: Secondary | ICD-10-CM | POA: Diagnosis not present

## 2012-03-31 DIAGNOSIS — M869 Osteomyelitis, unspecified: Secondary | ICD-10-CM | POA: Insufficient documentation

## 2012-03-31 DIAGNOSIS — I10 Essential (primary) hypertension: Secondary | ICD-10-CM | POA: Diagnosis not present

## 2012-03-31 DIAGNOSIS — T8459XA Infection and inflammatory reaction due to other internal joint prosthesis, initial encounter: Secondary | ICD-10-CM

## 2012-03-31 DIAGNOSIS — Y849 Medical procedure, unspecified as the cause of abnormal reaction of the patient, or of later complication, without mention of misadventure at the time of the procedure: Secondary | ICD-10-CM | POA: Diagnosis present

## 2012-03-31 DIAGNOSIS — Z96659 Presence of unspecified artificial knee joint: Secondary | ICD-10-CM

## 2012-03-31 DIAGNOSIS — R82998 Other abnormal findings in urine: Secondary | ICD-10-CM | POA: Diagnosis not present

## 2012-03-31 HISTORY — PX: INCISION AND DRAINAGE HIP: SHX1801

## 2012-03-31 SURGERY — IRRIGATION AND DEBRIDEMENT HIP WITH POLY EXCHANGE
Anesthesia: General | Site: Hip | Laterality: Left | Wound class: Dirty or Infected

## 2012-03-31 MED ORDER — FENTANYL CITRATE 0.05 MG/ML IJ SOLN
INTRAMUSCULAR | Status: DC | PRN
  Administered 2012-03-31: 50 ug via INTRAVENOUS
  Administered 2012-03-31: 100 ug via INTRAVENOUS
  Administered 2012-03-31: 50 ug via INTRAVENOUS

## 2012-03-31 MED ORDER — FENTANYL CITRATE 0.05 MG/ML IJ SOLN
INTRAMUSCULAR | Status: AC
  Filled 2012-03-31: qty 2

## 2012-03-31 MED ORDER — ONDANSETRON HCL 4 MG/2ML IJ SOLN
INTRAMUSCULAR | Status: DC | PRN
  Administered 2012-03-31: 4 mg via INTRAVENOUS

## 2012-03-31 MED ORDER — LATANOPROST 0.005 % OP SOLN
1.0000 [drp] | Freq: Every day | OPHTHALMIC | Status: DC
  Administered 2012-03-31 – 2012-04-03 (×4): 1 [drp] via OPHTHALMIC
  Filled 2012-03-31: qty 2.5

## 2012-03-31 MED ORDER — SODIUM CHLORIDE 0.9 % IV SOLN
INTRAVENOUS | Status: DC
  Administered 2012-04-02: 12:00:00 via INTRAVENOUS

## 2012-03-31 MED ORDER — VANCOMYCIN HCL 1000 MG IV SOLR
1000.0000 mg | INTRAVENOUS | Status: DC | PRN
  Administered 2012-03-31: 1000 mg via INTRAVENOUS

## 2012-03-31 MED ORDER — METOCLOPRAMIDE HCL 5 MG/ML IJ SOLN
5.0000 mg | Freq: Three times a day (TID) | INTRAMUSCULAR | Status: DC | PRN
  Administered 2012-04-03: 10 mg via INTRAVENOUS
  Filled 2012-03-31: qty 2

## 2012-03-31 MED ORDER — ROCURONIUM BROMIDE 100 MG/10ML IV SOLN
INTRAVENOUS | Status: DC | PRN
  Administered 2012-03-31: 30 mg via INTRAVENOUS

## 2012-03-31 MED ORDER — BUPIVACAINE HCL 0.25 % IJ SOLN
INTRAMUSCULAR | Status: DC | PRN
  Administered 2012-03-31: 20 mL

## 2012-03-31 MED ORDER — PROPOFOL 10 MG/ML IV BOLUS
INTRAVENOUS | Status: DC | PRN
  Administered 2012-03-31: 110 mg via INTRAVENOUS

## 2012-03-31 MED ORDER — FLEET ENEMA 7-19 GM/118ML RE ENEM: 1.0000 | ENEMA | Freq: Once | RECTAL | Status: AC | PRN

## 2012-03-31 MED ORDER — ACETAMINOPHEN 10 MG/ML IV SOLN
INTRAVENOUS | Status: AC
  Filled 2012-03-31: qty 100

## 2012-03-31 MED ORDER — GLYCOPYRROLATE 0.2 MG/ML IJ SOLN
INTRAMUSCULAR | Status: DC | PRN
  Administered 2012-03-31: 0.6 mg via INTRAVENOUS

## 2012-03-31 MED ORDER — SERTRALINE HCL 50 MG PO TABS
150.0000 mg | ORAL_TABLET | Freq: Every day | ORAL | Status: DC
  Administered 2012-04-01 – 2012-04-04 (×4): 150 mg via ORAL
  Filled 2012-03-31 (×4): qty 1

## 2012-03-31 MED ORDER — ZOLPIDEM TARTRATE 5 MG PO TABS: 5.0000 mg | ORAL_TABLET | Freq: Every evening | ORAL | Status: DC | PRN

## 2012-03-31 MED ORDER — LACTATED RINGERS IV SOLN
INTRAVENOUS | Status: DC
  Administered 2012-03-31: 11:00:00 via INTRAVENOUS

## 2012-03-31 MED ORDER — FENTANYL CITRATE 0.05 MG/ML IJ SOLN
25.0000 ug | INTRAMUSCULAR | Status: DC | PRN
  Administered 2012-03-31: 50 ug via INTRAVENOUS
  Administered 2012-03-31 (×2): 25 ug via INTRAVENOUS

## 2012-03-31 MED ORDER — METHOCARBAMOL 500 MG PO TABS
500.0000 mg | ORAL_TABLET | Freq: Four times a day (QID) | ORAL | Status: DC | PRN
  Administered 2012-04-01 – 2012-04-02 (×2): 500 mg via ORAL
  Filled 2012-03-31 (×2): qty 1

## 2012-03-31 MED ORDER — DIPHENHYDRAMINE HCL 12.5 MG/5ML PO ELIX: 12.5000 mg | ORAL_SOLUTION | ORAL | Status: DC | PRN

## 2012-03-31 MED ORDER — METOCLOPRAMIDE HCL 10 MG PO TABS: 5.0000 mg | ORAL_TABLET | Freq: Three times a day (TID) | ORAL | Status: DC | PRN

## 2012-03-31 MED ORDER — MENTHOL 3 MG MT LOZG: 1.0000 | LOZENGE | OROMUCOSAL | Status: DC | PRN

## 2012-03-31 MED ORDER — WARFARIN SODIUM 3 MG PO TABS
3.0000 mg | ORAL_TABLET | Freq: Once | ORAL | Status: AC
  Administered 2012-03-31: 3 mg via ORAL
  Filled 2012-03-31: qty 1

## 2012-03-31 MED ORDER — OXYCODONE HCL 5 MG PO TABS
5.0000 mg | ORAL_TABLET | ORAL | Status: DC | PRN
  Administered 2012-03-31 – 2012-04-01 (×3): 10 mg via ORAL
  Administered 2012-04-01 (×3): 5 mg via ORAL
  Administered 2012-04-02 – 2012-04-03 (×6): 10 mg via ORAL
  Administered 2012-04-04: 5 mg via ORAL
  Filled 2012-03-31 (×2): qty 2
  Filled 2012-03-31: qty 1
  Filled 2012-03-31: qty 2
  Filled 2012-03-31 (×2): qty 1
  Filled 2012-03-31 (×2): qty 2
  Filled 2012-03-31: qty 1
  Filled 2012-03-31: qty 2
  Filled 2012-03-31: qty 1
  Filled 2012-03-31 (×2): qty 2

## 2012-03-31 MED ORDER — VANCOMYCIN HCL IN DEXTROSE 1-5 GM/200ML-% IV SOLN
1000.0000 mg | Freq: Two times a day (BID) | INTRAVENOUS | Status: AC
  Administered 2012-03-31: 1000 mg via INTRAVENOUS
  Filled 2012-03-31: qty 200

## 2012-03-31 MED ORDER — ENOXAPARIN SODIUM 30 MG/0.3ML ~~LOC~~ SOLN
30.0000 mg | Freq: Two times a day (BID) | SUBCUTANEOUS | Status: DC
  Administered 2012-04-01 – 2012-04-02 (×3): 30 mg via SUBCUTANEOUS
  Filled 2012-03-31 (×5): qty 0.3

## 2012-03-31 MED ORDER — CALCIUM CARBONATE 1250 (500 CA) MG PO TABS
1.0000 | ORAL_TABLET | Freq: Two times a day (BID) | ORAL | Status: DC
  Administered 2012-04-01 – 2012-04-04 (×7): 500 mg via ORAL
  Filled 2012-03-31 (×9): qty 1

## 2012-03-31 MED ORDER — ALUM & MAG HYDROXIDE-SIMETH 200-200-20 MG/5ML PO SUSP: 30.0000 mL | ORAL | Status: DC | PRN

## 2012-03-31 MED ORDER — ROPINIROLE HCL 1 MG PO TABS
1.0000 mg | ORAL_TABLET | Freq: Every evening | ORAL | Status: DC | PRN
  Administered 2012-04-04: 1 mg via ORAL
  Filled 2012-03-31: qty 1

## 2012-03-31 MED ORDER — PHENYLEPHRINE HCL 10 MG/ML IJ SOLN
INTRAMUSCULAR | Status: DC | PRN
  Administered 2012-03-31: 120 ug via INTRAVENOUS
  Administered 2012-03-31: 80 ug via INTRAVENOUS
  Administered 2012-03-31: 40 ug via INTRAVENOUS
  Administered 2012-03-31 (×15): 80 ug via INTRAVENOUS
  Administered 2012-03-31: 120 ug via INTRAVENOUS
  Administered 2012-03-31 (×2): 80 ug via INTRAVENOUS
  Administered 2012-03-31: 40 ug via INTRAVENOUS

## 2012-03-31 MED ORDER — MIDAZOLAM HCL 5 MG/5ML IJ SOLN
INTRAMUSCULAR | Status: DC | PRN
  Administered 2012-03-31: 1 mg via INTRAVENOUS

## 2012-03-31 MED ORDER — SODIUM CHLORIDE 0.9 % IR SOLN
Status: DC | PRN
  Administered 2012-03-31: 1000 mL
  Administered 2012-03-31 (×2): 3000 mL

## 2012-03-31 MED ORDER — ACETAMINOPHEN 325 MG PO TABS
650.0000 mg | ORAL_TABLET | Freq: Four times a day (QID) | ORAL | Status: DC | PRN
  Administered 2012-04-01 – 2012-04-04 (×3): 650 mg via ORAL
  Filled 2012-03-31 (×3): qty 2

## 2012-03-31 MED ORDER — PHENOL 1.4 % MT LIQD: 1.0000 | OROMUCOSAL | Status: DC | PRN

## 2012-03-31 MED ORDER — ONDANSETRON HCL 4 MG/2ML IJ SOLN
4.0000 mg | Freq: Four times a day (QID) | INTRAMUSCULAR | Status: DC | PRN
  Administered 2012-04-03: 4 mg via INTRAVENOUS
  Filled 2012-03-31: qty 2

## 2012-03-31 MED ORDER — BUPROPION HCL ER (XL) 300 MG PO TB24
300.0000 mg | ORAL_TABLET | Freq: Every day | ORAL | Status: DC
  Administered 2012-04-01 – 2012-04-04 (×4): 300 mg via ORAL
  Filled 2012-03-31 (×4): qty 1

## 2012-03-31 MED ORDER — CALCIUM CARBONATE 200 MG PO CAPS: 600.0000 mg | ORAL_CAPSULE | Freq: Two times a day (BID) | ORAL | Status: DC

## 2012-03-31 MED ORDER — VANCOMYCIN HCL IN DEXTROSE 1-5 GM/200ML-% IV SOLN
INTRAVENOUS | Status: AC
  Filled 2012-03-31: qty 200

## 2012-03-31 MED ORDER — LACTATED RINGERS IV SOLN
INTRAVENOUS | Status: DC | PRN
  Administered 2012-03-31 (×3): via INTRAVENOUS

## 2012-03-31 MED ORDER — BUPIVACAINE HCL (PF) 0.25 % IJ SOLN
INTRAMUSCULAR | Status: AC
  Filled 2012-03-31: qty 30

## 2012-03-31 MED ORDER — DOCUSATE SODIUM 100 MG PO CAPS
100.0000 mg | ORAL_CAPSULE | Freq: Two times a day (BID) | ORAL | Status: DC
Start: 2012-03-31 — End: 2012-04-04
  Administered 2012-03-31 – 2012-04-04 (×8): 100 mg via ORAL
  Filled 2012-03-31 (×9): qty 1

## 2012-03-31 MED ORDER — BISACODYL 5 MG PO TBEC: 5.0000 mg | DELAYED_RELEASE_TABLET | Freq: Every day | ORAL | Status: DC | PRN

## 2012-03-31 MED ORDER — ACETAMINOPHEN 10 MG/ML IV SOLN
1000.0000 mg | Freq: Four times a day (QID) | INTRAVENOUS | Status: AC
  Administered 2012-03-31: 1000 mg via INTRAVENOUS
  Filled 2012-03-31 (×4): qty 100

## 2012-03-31 MED ORDER — WARFARIN - PHARMACIST DOSING INPATIENT: Freq: Every day | Status: DC

## 2012-03-31 MED ORDER — DROPERIDOL 2.5 MG/ML IJ SOLN: 0.6250 mg | INTRAMUSCULAR | Status: DC | PRN

## 2012-03-31 MED ORDER — AMLODIPINE BESYLATE 5 MG PO TABS
5.0000 mg | ORAL_TABLET | Freq: Every day | ORAL | Status: DC
  Administered 2012-04-02 – 2012-04-04 (×3): 5 mg via ORAL
  Filled 2012-03-31 (×4): qty 1

## 2012-03-31 MED ORDER — ATORVASTATIN CALCIUM 10 MG PO TABS
10.0000 mg | ORAL_TABLET | Freq: Every day | ORAL | Status: DC
  Administered 2012-04-01 – 2012-04-04 (×3): 10 mg via ORAL
  Filled 2012-03-31 (×4): qty 1

## 2012-03-31 MED ORDER — ONDANSETRON HCL 4 MG PO TABS: 4.0000 mg | ORAL_TABLET | Freq: Four times a day (QID) | ORAL | Status: DC | PRN

## 2012-03-31 MED ORDER — ACETAMINOPHEN 650 MG RE SUPP: 650.0000 mg | Freq: Four times a day (QID) | RECTAL | Status: DC | PRN

## 2012-03-31 MED ORDER — SENNOSIDES-DOCUSATE SODIUM 8.6-50 MG PO TABS: 1.0000 | ORAL_TABLET | Freq: Every evening | ORAL | Status: DC | PRN

## 2012-03-31 MED ORDER — METHOCARBAMOL 100 MG/ML IJ SOLN
500.0000 mg | Freq: Four times a day (QID) | INTRAMUSCULAR | Status: DC | PRN
  Filled 2012-03-31: qty 5

## 2012-03-31 MED ORDER — NEOSTIGMINE METHYLSULFATE 1 MG/ML IJ SOLN
INTRAMUSCULAR | Status: DC | PRN
  Administered 2012-03-31: 3 mg via INTRAVENOUS

## 2012-03-31 SURGICAL SUPPLY — 67 items
BAG URINE DRAINAGE (UROLOGICAL SUPPLIES) IMPLANT
BLADE SURG 10 STRL SS (BLADE) ×6 IMPLANT
CLOTH BEACON ORANGE TIMEOUT ST (SAFETY) ×2 IMPLANT
CONT SPEC 4OZ CLIKSEAL STRL BL (MISCELLANEOUS) IMPLANT
COVER SURGICAL LIGHT HANDLE (MISCELLANEOUS) ×2 IMPLANT
DRAPE INCISE IOBAN 66X45 STRL (DRAPES) IMPLANT
DRAPE ORTHO SPLIT 77X108 STRL (DRAPES) ×4
DRAPE SURG ORHT 6 SPLT 77X108 (DRAPES) ×2 IMPLANT
DRAPE U-SHAPE 47X51 STRL (DRAPES) ×2 IMPLANT
DRILL BIT 7/64X5 (BIT) ×2 IMPLANT
DRSG MEPILEX BORDER 4X12 (GAUZE/BANDAGES/DRESSINGS) IMPLANT
DRSG MEPILEX BORDER 4X8 (GAUZE/BANDAGES/DRESSINGS) IMPLANT
DURAPREP 26ML APPLICATOR (WOUND CARE) ×2 IMPLANT
ELECT REM PT RETURN 9FT ADLT (ELECTROSURGICAL) ×2
ELECTRODE REM PT RTRN 9FT ADLT (ELECTROSURGICAL) ×1 IMPLANT
EVACUATOR 1/8 PVC DRAIN (DRAIN) ×2 IMPLANT
GLOVE BIOGEL M 7.0 STRL (GLOVE) ×2 IMPLANT
GLOVE BIOGEL PI IND STRL 7.0 (GLOVE) IMPLANT
GLOVE BIOGEL PI IND STRL 7.5 (GLOVE) ×2 IMPLANT
GLOVE BIOGEL PI IND STRL 8 (GLOVE) ×2 IMPLANT
GLOVE BIOGEL PI IND STRL 8.5 (GLOVE) ×1 IMPLANT
GLOVE BIOGEL PI INDICATOR 7.0 (GLOVE)
GLOVE BIOGEL PI INDICATOR 7.5 (GLOVE) ×2
GLOVE BIOGEL PI INDICATOR 8 (GLOVE) ×2
GLOVE BIOGEL PI INDICATOR 8.5 (GLOVE) ×1
GLOVE SURG ORTHO 7.0 STRL STRW (GLOVE) IMPLANT
GLOVE SURG ORTHO 8.0 STRL STRW (GLOVE) ×2 IMPLANT
GLOVE SURG SS PI 7.0 STRL IVOR (GLOVE) ×2 IMPLANT
GLOVE SURG SS PI 7.5 STRL IVOR (GLOVE) ×2 IMPLANT
GOWN PREVENTION PLUS XLARGE (GOWN DISPOSABLE) ×4 IMPLANT
GOWN SRG XL XLNG 56XLVL 4 (GOWN DISPOSABLE) ×2 IMPLANT
GOWN STRL NON-REIN LRG LVL3 (GOWN DISPOSABLE) ×2 IMPLANT
GOWN STRL NON-REIN XL XLG LVL4 (GOWN DISPOSABLE) ×4
HANDPIECE INTERPULSE COAX TIP (DISPOSABLE) ×1
HEAD OSTEO CTAPER L FIT (Orthopedic Implant) ×2 IMPLANT
HEAD OSTEO CTAPER L FIT22 +10 (Orthopedic Implant) ×1 IMPLANT
HOOD PEEL AWAY FACE SHEILD DIS (HOOD) ×10 IMPLANT
INSERT ACTB 50X22XCNSTRN HIP (Orthopedic Implant) ×1 IMPLANT
INSERT OSTEO ACET (Orthopedic Implant) ×2 IMPLANT
INSRT ACTB 50X22XCNSTRN HIP (Orthopedic Implant) ×1 IMPLANT
KIT BASIN OR (CUSTOM PROCEDURE TRAY) ×2 IMPLANT
KIT ROOM TURNOVER OR (KITS) ×2 IMPLANT
MANIFOLD NEPTUNE II (INSTRUMENTS) ×2 IMPLANT
NEEDLE HYPO 25GX1X1/2 BEV (NEEDLE) ×2 IMPLANT
NS IRRIG 1000ML POUR BTL (IV SOLUTION) ×2 IMPLANT
PACK TOTAL JOINT (CUSTOM PROCEDURE TRAY) ×2 IMPLANT
PAD ARMBOARD 7.5X6 YLW CONV (MISCELLANEOUS) ×4 IMPLANT
SCREW CANN 32 THRD/65 7.3 (Screw) ×2 IMPLANT
SET HNDPC FAN SPRY TIP SCT (DISPOSABLE) ×1 IMPLANT
SPONGE GAUZE 4X4 12PLY (GAUZE/BANDAGES/DRESSINGS) ×2 IMPLANT
STAPLER VISISTAT 35W (STAPLE) ×2 IMPLANT
SUCTION FRAZIER TIP 10 FR DISP (SUCTIONS) ×2 IMPLANT
SUT PDS AB 0 CT 36 (SUTURE) ×4 IMPLANT
SUT PDS AB 1 CTX 36 (SUTURE) ×6 IMPLANT
SUT PDS AB 2-0 CT1 27 (SUTURE) ×4 IMPLANT
SUT VIC AB 0 CTB1 27 (SUTURE) ×4 IMPLANT
SUT VIC AB 1 CT1 27 (SUTURE) ×4
SUT VIC AB 1 CT1 27XBRD ANBCTR (SUTURE) ×2 IMPLANT
SUT VIC AB 2-0 CT1 27 (SUTURE) ×2
SUT VIC AB 2-0 CT1 TAPERPNT 27 (SUTURE) ×2 IMPLANT
SYR 20CC LL (SYRINGE) ×2 IMPLANT
SYRINGE 10CC LL (SYRINGE) IMPLANT
TOWEL OR 17X24 6PK STRL BLUE (TOWEL DISPOSABLE) ×2 IMPLANT
TOWEL OR 17X26 10 PK STRL BLUE (TOWEL DISPOSABLE) ×2 IMPLANT
TRAY FOLEY CATH 14FR (SET/KITS/TRAYS/PACK) ×2 IMPLANT
WATER STERILE IRR 1000ML POUR (IV SOLUTION) IMPLANT
YANKAUER SUCT BULB TIP NO VENT (SUCTIONS) ×2 IMPLANT

## 2012-03-31 NOTE — Transfer of Care (Signed)
Immediate Anesthesia Transfer of Care Note  Patient: Renee Adams  Procedure(s) Performed: Procedure(s) (LRB) with comments: IRRIGATION AND DEBRIDEMENT HIP WITH POLY EXCHANGE (Left)  Patient Location: PACU  Anesthesia Type: General  Level of Consciousness: awake, alert  and oriented  Airway & Oxygen Therapy: Patient Spontanous Breathing and Patient connected to face mask oxygen  Post-op Assessment: Report given to PACU RN and Post -op Vital signs reviewed and stable  Post vital signs: Reviewed  Complications: No apparent anesthesia complications

## 2012-03-31 NOTE — H&P (Signed)
NAME: Renee Adams MRN:   [id number] DOB:   [birth year]     HISTORY AND PHYSICAL  CHIEF COMPLAINT:  Left hip pain  HISTORY:   Renee Mims Wrightis a 78 y.o. female  with left  Hip Pain Patient complains of left hip pain. Onset of the symptoms was several weeks ago. Inciting event: none. The patient reports the hip pain is worse with weight bearing and is aggravated by walking. Associated symptoms: none. Aggravating symptoms include: any weight bearing and going up and down stairs. Patient has had prior hip problems. Previous visits for this problem: yes, last seen several weeks ago by me. Evaluation to date: plain films, which were normal. Treatment to date: OTC analgesics, which have been not very effective. Patient had hip aspiration that grew strep b  PAST MEDICAL HISTORY:   Past Medical History  Diagnosis Date  . GERD (gastroesophageal reflux disease)   . Glaucoma   . Cancer   . Hypertension   . Depression     PAST SURGICAL HISTORY:   Past Surgical History  Procedure Date  . Total knee arthroplasty   . Total hip arthroplasty   . Mastectomy 1996    left breast lumpectomy     MEDICATIONS:   No prescriptions prior to admission    ALLERGIES:   Allergies  Allergen Reactions  . Dilaudid (Hydromorphone Hcl) Shortness Of Breath  . Morphine And Related Shortness Of Breath  . Sulfa Antibiotics Other (See Comments)    "Made me drunk";  wobbly  . Keflex (Cephalexin) Nausea Only    REVIEW OF SYSTEMS:   Negative except hpi  FAMILY HISTORY:   Family History  Problem Relation Age of Onset  . Cancer Mother     SOCIAL HISTORY:   reports that she has never smoked. She has never used smokeless tobacco. She reports that she does not drink alcohol or use illicit drugs.  PHYSICAL EXAM:  General appearance: alert, cooperative and no distress Resp: clear to auscultation bilaterally Cardio: regular rate and rhythm, S1, S2 normal, no murmur, click, rub or gallop GI: soft, non-tender;  bowel sounds normal; no masses,  no organomegaly Extremities: extremities normal, atraumatic, no cyanosis or edema and Homans sign is negative, no sign of DVT Pulses: 2+ and symmetric Skin: Skin color, texture, turgor normal. No rashes or lesions Neurologic: Alert and oriented X 3, normal strength and tone. Normal symmetric reflexes. Normal coordination and gait    LABORATORY STUDIES:  Basename 03/28/12 1533  WBC 10.5  HGB 11.4*  HCT 35.0*  PLT 350     Basename 03/28/12 1533  NA 141  K 4.2  CL 106  CO2 26  GLUCOSE 92  BUN 13  CREATININE 0.70  CALCIUM 9.9    STUDIES/RESULTS:  Dg Chest 2 View  03/28/2012  *RADIOLOGY REPORT*  Clinical Data: Preop.  History of stroke.  History of breast cancer, hypertension.  CHEST - 2 VIEW  Comparison: 02/08/2011  Findings: The heart is upper limits normal in size.  Aorta is mildly tortuous.  There are no focal consolidations or pleural effusions.  Mild bibasilar atelectasis.  There is S-shaped scoliosis of the thoracic spine.  Surgical clips are identified in the upper abdomen.  Surgical clips are identified in the left axilla.  IMPRESSION: No evidence for acute cardiopulmonary abnormality.   Original Report Authenticated By: Patterson Hammersmith, M.D.    Dg Fluoro Guide Ndl Plc/bx  03/24/2012  *RADIOLOGY REPORT*  CLINICAL DATA:  Left hip pain.  Possible  infection  Left hip aspiration:  Following informed, written consent, the patient was positioned supine and the left hip was located under fluoroscopy.  The skin was prepped and draped in the usual sterile fashion.  The superficial soft tissues were anesthetized with 1% lidocaine.  A 18 gauge needle was advanced into the joint.  Placement was confirmed with 1.0 ml of Omnipaque 180.  Then aspirated 1 ml of sanguinous fluid.  I injected 5 ml of sterile saline and aspirated a second 1 ml of sanguinous fluid. Both samples were sent for Gram stain and cultures as requested.  FLUORO TIME:  1.53 minutes   IMPRESSIONS:  Technically successful left hip aspiration under fluoroscopic guidance.   Original Report Authenticated By: Jamesetta Orleans. MATTERN, M.D.     ASSESSMENT:   left hip pain        Active Problems:  * No active hospital problems. *     PLAN:  Left hip poly exchange I&D   Chin Wachter 03/31/2012. 7:31 AM

## 2012-03-31 NOTE — Anesthesia Preprocedure Evaluation (Signed)
Anesthesia Evaluation  Patient identified by MRN, date of birth, ID band Patient awake    Reviewed: Allergy & Precautions  Airway Mallampati: II TM Distance: >3 FB     Dental  (+) Teeth Intact and Dental Advisory Given   Pulmonary neg pulmonary ROS,    Pulmonary exam normal       Cardiovascular hypertension, Pt. on medications     Neuro/Psych PSYCHIATRIC DISORDERS Depression negative neurological ROS     GI/Hepatic Neg liver ROS, GERD-  Medicated,  Endo/Other  negative endocrine ROS  Renal/GU negative Renal ROS     Musculoskeletal   Abdominal   Peds  Hematology   Anesthesia Other Findings   Reproductive/Obstetrics                           Anesthesia Physical Anesthesia Plan  ASA: III  Anesthesia Plan: General   Post-op Pain Management:    Induction: Intravenous  Airway Management Planned: Oral ETT  Additional Equipment:   Intra-op Plan:   Post-operative Plan: Extubation in OR  Informed Consent: I have reviewed the patients History and Physical, chart, labs and discussed the procedure including the risks, benefits and alternatives for the proposed anesthesia with the patient or authorized representative who has indicated his/her understanding and acceptance.   Dental advisory given  Plan Discussed with: CRNA, Anesthesiologist and Surgeon  Anesthesia Plan Comments:         Anesthesia Quick Evaluation

## 2012-03-31 NOTE — Anesthesia Procedure Notes (Signed)
Procedure Name: Intubation Date/Time: 03/31/2012 11:46 AM Performed by: Quentin Ore Pre-anesthesia Checklist: Patient identified, Emergency Drugs available, Suction available, Patient being monitored and Timeout performed Patient Re-evaluated:Patient Re-evaluated prior to inductionOxygen Delivery Method: Circle system utilized Preoxygenation: Pre-oxygenation with 100% oxygen Intubation Type: IV induction Ventilation: Mask ventilation without difficulty Laryngoscope Size: Mac and 3 Grade View: Grade I Tube type: Oral Tube size: 7.0 mm Number of attempts: 1 Airway Equipment and Method: Stylet Placement Confirmation: ETT inserted through vocal cords under direct vision,  positive ETCO2 and breath sounds checked- equal and bilateral Secured at: 21 cm Tube secured with: Tape Dental Injury: Teeth and Oropharynx as per pre-operative assessment

## 2012-03-31 NOTE — Consult Note (Signed)
Consult requested by PA [name].  Diagnosis : bacteriuria  History of Present Illness: Pt underwent left hip poly exchange I&D washout today. Joint aspirate grew Group B strep. She has had some mild dysuria. Urine Cx Sep 2013 grew 50 K mixed. Urine Cx Aug 2012 grew e coli.   She underwent anterior mesh, posterior mesh and mesh sling in Jul 2011 with Dr. [name]. She has had some mild dysuria. Voiding with good stream. No frequency or urgency. No hematuria. She feels she empties her bladder.    Past Medical History  Diagnosis Date  . GERD (gastroesophageal reflux disease)   . Glaucoma   . Cancer   . Hypertension   . Depression    Past Surgical History  Procedure Date  . Total knee arthroplasty   . Total hip arthroplasty   . Mastectomy 1996    left breast lumpectomy     Home Medications:  Prescriptions prior to admission  Medication Sig Dispense Refill  . amLODipine (NORVASC) 5 MG tablet Take 5 mg by mouth daily.      Marland Kitchen atorvastatin (LIPITOR) 10 MG tablet Take 10 mg by mouth daily.        Marland Kitchen buPROPion (WELLBUTRIN XL) 300 MG 24 hr tablet Take 300 mg by mouth daily.      . calcium carbonate 200 MG capsule Take 600 mg by mouth 2 (two) times daily with a meal.      . enoxaparin (LOVENOX) 80 MG/0.8ML injection Inject 65 mg into the skin every 12 (twelve) hours.      Marland Kitchen latanoprost (XALATAN) 0.005 % ophthalmic solution Place 1 drop into both eyes at bedtime.      Marland Kitchen rOPINIRole (REQUIP) 1 MG tablet Take 1 mg by mouth at bedtime as needed. For restless legs      . sertraline (ZOLOFT) 100 MG tablet Take 150 mg by mouth daily.      Marland Kitchen warfarin (COUMADIN) 2 MG tablet Take 3 mg by mouth daily. Pt takes 1.5 tab daily       Allergies:  Allergies  Allergen Reactions  . Dilaudid (Hydromorphone Hcl) Shortness Of Breath  . Morphine And Related Shortness Of Breath  . Sulfa Antibiotics Other (See Comments)    "Made me drunk";  wobbly  . Keflex (Cephalexin) Nausea Only    Family History    Problem Relation Age of Onset  . Cancer Mother    Social History:  reports that she has never smoked. She has never used smokeless tobacco. She reports that she does not drink alcohol or use illicit drugs.  ROS: A complete review of systems was performed.  All systems are negative except for pertinent findings as noted.    Physical Exam:  Vital signs in last 24 hours: Temp:  [97.7 F (36.5 C)-98.5 F (36.9 C)] 97.7 F (36.5 C) (09/23 1515) Pulse Rate:  [80-101] 85  (09/23 1800) Resp:  [10-21] 18  (09/23 1800) BP: (90-152)/(46-79) 103/50 mmHg (09/23 1754) SpO2:  [88 %-100 %] 100 % (09/23 1800) General:  Alert and oriented, No acute distress HEENT: Normocephalic, atraumatic Neck: No JVD or lymphadenopathy Cardiovascular: Regular rate and rhythm Lungs: Regular rate and effort Abdomen: Soft, nontender, nondistended, no abdominal masses GU: foley catheter in place, urine clear.  Back: No CVA tenderness Extremities: No edema Neurologic: Grossly intact  Laboratory Data:  No results found for this or any previous visit (from the past 24 hour(s)). Recent Results (from the past 240 hour(s))  SURGICAL PCR SCREEN  Status: Normal   Collection Time   03/28/12  3:36 PM      Component Value Range Status Comment   MRSA, PCR NEGATIVE  NEGATIVE Final    Staphylococcus aureus NEGATIVE  NEGATIVE Final   URINE CULTURE     Status: Normal   Collection Time   03/28/12  4:05 PM      Component Value Range Status Comment   Specimen Description URINE, CATHETERIZED   Final    Special Requests NONE   Final    Culture  Setup Time 03/28/2012 17:16   Final    Colony Count 50,000 COLONIES/ML   Final    Culture     Final    Value: Multiple bacterial morphotypes present, none predominant. Suggest appropriate recollection if clinically indicated.   Report Status 03/30/2012 FINAL   Final    Creatinine:  Basename 03/28/12 1533  CREATININE 0.70    Impression/Assessment:  Possible UTI vs.  asymptomatic bacteriuria. Not certain it's related to hip infection. Plan:  -Would treat for infection based on hip Cx and ID recs.  -She should f/u with Dr. [name] for exam and cystoscopy. I will sign out to him so that he can follow patient through hospital stay.   Antony Haste 03/31/2012, 6:21 PM

## 2012-03-31 NOTE — Anesthesia Postprocedure Evaluation (Signed)
Anesthesia Post Note  Patient: Renee Adams  Procedure(s) Performed: Procedure(s) (LRB): IRRIGATION AND DEBRIDEMENT HIP WITH POLY EXCHANGE (Left)  Anesthesia type: general  Patient location: PACU  Post pain: Pain level controlled  Post assessment: Patient's Cardiovascular Status Stable  Last Vitals:  Filed Vitals:   03/31/12 1615  BP:   Pulse: 81  Temp:   Resp: 12    Post vital signs: Reviewed and stable  Level of consciousness: sedated  Complications: No apparent anesthesia complications

## 2012-03-31 NOTE — Preoperative (Signed)
Beta Blockers   Reason not to administer Beta Blockers:Not Applicable 

## 2012-03-31 NOTE — Progress Notes (Signed)
1015 pt. Reported after taking am meds she became nauseated and threw up scant amount of clear salivia at 0845 this am. At present denies  nausea.  1030 Notified Dr.Singer of above. Also asked if a pt/ptt was needed today due to pre-adm. Labs being elevated. He stated that would be determine by Dr. [name].  Dr. [name] on short stay unit,ask if pt/ptt needed prior to surgery. He stated none was needed.

## 2012-03-31 NOTE — Progress Notes (Signed)
ANTICOAGULATION CONSULT NOTE - Initial Consult  Pharmacy Consult for Coumadin Indication: Hx CVA; VTE prophylaxis  Allergies  Allergen Reactions  . Dilaudid (Hydromorphone Hcl) Shortness Of Breath  . Morphine And Related Shortness Of Breath  . Sulfa Antibiotics Other (See Comments)    "Made me drunk";  wobbly  . Keflex (Cephalexin) Nausea Only    Patient Measurements:  Wt: 66.4kg Heparin Dosing Weight:   Vital Signs: Temp: 97.7 F (36.5 C) (09/23 1515) Temp src: Oral (09/23 1012) BP: 98/50 mmHg (09/23 1915) Pulse Rate: 77  (09/23 1930)  Labs: No results found for this basename: HGB:2,HCT:3,PLT:3,APTT:3,LABPROT:3,INR:3,HEPARINUNFRC:3,CREATININE:3,CKTOTAL:3,CKMB:3,TROPONINI:3 in the last 72 hours  The CrCl is unknown because both a height and weight (above a minimum accepted value) are required for this calculation.   Medical History: Past Medical History  Diagnosis Date  . GERD (gastroesophageal reflux disease)   . Glaucoma   . Cancer   . Hypertension   . Depression     Medications:  Prescriptions prior to admission  Medication Sig Dispense Refill  . amLODipine (NORVASC) 5 MG tablet Take 5 mg by mouth daily.      Marland Kitchen atorvastatin (LIPITOR) 10 MG tablet Take 10 mg by mouth daily.        Marland Kitchen buPROPion (WELLBUTRIN XL) 300 MG 24 hr tablet Take 300 mg by mouth daily.      . calcium carbonate 200 MG capsule Take 600 mg by mouth 2 (two) times daily with a meal.      . enoxaparin (LOVENOX) 80 MG/0.8ML injection Inject 65 mg into the skin every 12 (twelve) hours.      Marland Kitchen latanoprost (XALATAN) 0.005 % ophthalmic solution Place 1 drop into both eyes at bedtime.      Marland Kitchen rOPINIRole (REQUIP) 1 MG tablet Take 1 mg by mouth at bedtime as needed. For restless legs      . sertraline (ZOLOFT) 100 MG tablet Take 150 mg by mouth daily.      Marland Kitchen warfarin (COUMADIN) 2 MG tablet Take 3 mg by mouth daily. Pt takes 1.5 tab daily        Assessment: 77yof s/p hip procedure to resume Coumadin for  hx CVA and VTE prophylaxis (has been on hold since 9/20 for procedure). Per patient, home regimen is 3mg  daily. Patient will also be receiving Lovenox 30mg  q12h until INR >1.8 - Labs 9/20: INR 1.72, Hg 11.4, Plts 350 - No significant bleeding reported  Goal of Therapy:  INR 2-3   Plan:  1. Coumadin 3mg  po x 1 today 2. Follow-up AM INR and CBC  Cleon Dew [phone] 03/31/2012,8:49 PM

## 2012-04-01 DIAGNOSIS — M009 Pyogenic arthritis, unspecified: Secondary | ICD-10-CM

## 2012-04-01 LAB — BASIC METABOLIC PANEL
CO2: 21 mEq/L (ref 19–32)
Chloride: 109 mEq/L (ref 96–112)
GFR calc non Af Amer: 83 mL/min — ABNORMAL LOW (ref >90)
Glucose, Bld: 76 mg/dL (ref 70–99)
Potassium: 3.3 mEq/L — ABNORMAL LOW (ref 3.5–5.1)
Sodium: 138 mEq/L (ref 135–145)

## 2012-04-01 LAB — PROTIME-INR
INR: 1.59 — ABNORMAL HIGH (ref 0.00–1.49)
Prothrombin Time: 18.5 seconds — ABNORMAL HIGH (ref 11.6–15.2)

## 2012-04-01 LAB — CBC
Hemoglobin: 7.3 g/dL — ABNORMAL LOW (ref 12.0–15.0)
MCHC: 31.7 g/dL (ref 30.0–36.0)
RBC: 2.68 MIL/uL — ABNORMAL LOW (ref 3.87–5.11)
WBC: 9 10*3/uL (ref 4.0–10.5)

## 2012-04-01 MED ORDER — DEXTROSE 5 % IV SOLN
1.0000 g | INTRAVENOUS | Status: DC
  Administered 2012-04-01 – 2012-04-04 (×4): 1 g via INTRAVENOUS
  Filled 2012-04-01 (×4): qty 10

## 2012-04-01 MED ORDER — WARFARIN SODIUM 5 MG PO TABS
5.0000 mg | ORAL_TABLET | Freq: Once | ORAL | Status: AC
  Administered 2012-04-01: 5 mg via ORAL
  Filled 2012-04-01: qty 1

## 2012-04-01 MED ORDER — RIFAMPIN 300 MG PO CAPS
300.0000 mg | ORAL_CAPSULE | Freq: Every day | ORAL | Status: DC
  Administered 2012-04-01 – 2012-04-04 (×4): 300 mg via ORAL
  Filled 2012-04-01 (×4): qty 1

## 2012-04-01 NOTE — Progress Notes (Signed)
Physical Therapy Progress Note   04/01/12 1300  PT Visit Information  Last PT Received On 04/01/12  Assistance Needed +1  PT Time Calculation  PT Start Time 1310  PT Stop Time 1338  PT Time Calculation (min) 28 min  Subjective Data  Subjective Tired and ready to get back into bed  Patient Stated Goal To get better  Precautions  Precautions Anterior Hip  Restrictions  Weight Bearing Restrictions Yes  LLE Weight Bearing WBAT  Cognition  Overall Cognitive Status Appears within functional limits for tasks assessed/performed  Arousal/Alertness Awake/alert  Orientation Level Appears intact for tasks assessed  Behavior During Session [facility] for tasks performed  Bed Mobility  Bed Mobility Sit to Supine;Scooting to HOB  Sit to Supine 4: Min assist  Scooting to [facility] 2: Max assist  Details for Bed Mobility Assistance (A) with lifting bilateraly LE into bed and help scoot to [facility].  Cues for hand placement and proper technique.  Transfers  Transfers Sit to Stand;Stand to Sit  Sit to Stand 4: Min assist;With upper extremity assist;From chair/3-in-1  Stand to Sit 4: Min assist;With upper extremity assist;To bed  Details for Transfer Assistance (A) with initiating mvt and RW placement.  Cues for proper technique and safety  Ambulation/Gait  Ambulation/Gait Assistance 4: Min guard  Ambulation Distance (Feet) 10 Feet  Assistive device Rolling walker  Ambulation/Gait Assistance Details Cues for proper technique. RW placement, and safety awareness of hip precautions  Gait Pattern Step-to pattern;Decreased stride length;Antalgic  Gait velocity decerased  Stairs No  Wheelchair Mobility  Wheelchair Mobility No  Balance  Balance Assessed No  PT - End of Session  Equipment Utilized During Treatment Gait belt  Activity Tolerance Patient tolerated treatment well;Patient limited by fatigue  Patient left in bed;with call bell/phone within reach;with family/visitor present  Nurse Communication Mobility  status  PT - Assessment/Plan  Comments on Treatment Session Pt c/o feeling fatigued and was ready to get back in bed.  Pt family requested to check backside for cleanliness.  Pt able to ambulate around bed and assisted with lifting LE into bed.  PT Plan Discharge plan remains appropriate;Frequency remains appropriate  PT Frequency 7X/week  Recommendations for Other Services Other (comment) (none)  Follow Up Recommendations Home health PT  Equipment Recommended None recommended by PT  Acute Rehab PT Goals  PT Goal Formulation With patient  Time For Goal Achievement 04/08/12  Potential to Achieve Goals Good  Pt will go Sit to Supine/Side with modified independence;with HOB 0 degrees  PT Goal: Sit to Supine/Side - Progress Progressing toward goal  Pt will go Sit to Stand with supervision;with upper extremity assist  PT Goal: Sit to Stand - Progress Progressing toward goal  Pt will go Stand to Sit with supervision;with upper extremity assist  PT Goal: Stand to Sit - Progress Progressing toward goal  Pt will Ambulate 51 - 150 feet;with supervision;with least restrictive assistive device  PT Goal: Ambulate - Progress Progressing toward goal    Pt reports having pain in L LE 4/10.  Renee Adams, SPT

## 2012-04-01 NOTE — Progress Notes (Signed)
Georgena Spurling, MD   [name], PA-C [address] [place], [place], [place]  [address]                             [phone]   PROGRESS NOTE  Subjective:  negative for Chest Pain  negative for Shortness of Breath  negative for Nausea/Vomiting   negative for Calf Pain  negative for Bowel Movement   Tolerating Diet: yes         Patient reports pain as 4 on 0-10 scale.    Objective: Vital signs in last 24 hours:   Patient Vitals for the past 24 hrs:  BP Temp Temp src Pulse Resp SpO2  04/01/12 0629 112/48 mmHg 98.4 F (36.9 C) - 87  18  96 %  04/01/12 0100 88/44 mmHg 98.9 F (37.2 C) Oral 83  14  94 %  03/31/12 2000 108/47 mmHg 97.7 F (36.5 C) - 88  16  99 %  03/31/12 1930 - - - 77  15  97 %  03/31/12 1915 98/50 mmHg - - 77  13  97 %  03/31/12 1900 101/56 mmHg - - 84  16  98 %  03/31/12 1845 - - - 77  10  96 %  03/31/12 1830 - - - 78  12  97 %  03/31/12 1815 - - - 79  14  97 %  03/31/12 1809 110/56 mmHg - - - - -  03/31/12 1800 - - - 85  18  100 %  03/31/12 1754 103/50 mmHg - - - - -  03/31/12 1745 - - - 85  21  98 %  03/31/12 1744 99/52 mmHg - - - - -  03/31/12 1730 - - - 80  14  97 %  03/31/12 1724 90/56 mmHg - - - - -  03/31/12 1715 - - - 85  15  98 %  03/31/12 1709 94/54 mmHg - - - - -  03/31/12 1700 - - - 81  10  94 %  03/31/12 1654 96/54 mmHg - - - - -  03/31/12 1645 - - - 83  12  93 %  03/31/12 1639 94/46 mmHg - - - - -  03/31/12 1630 - - - 87  19  98 %  03/31/12 1624 102/52 mmHg - - - - -  03/31/12 1615 - - - 81  12  93 %  03/31/12 1609 97/51 mmHg - - - - -  03/31/12 1600 - - - 84  17  92 %  03/31/12 1554 95/54 mmHg - - - - -  03/31/12 1545 - - - 88  15  95 %  03/31/12 1539 108/55 mmHg - - 92  14  88 %  03/31/12 1530 - - - 89  13  95 %  03/31/12 1525 - - - 94  15  98 %  03/31/12 1524 110/53 mmHg - - - - -  03/31/12 1515 110/53 mmHg 97.7 F (36.5 C) - 92  13  97 %  03/31/12 1012 152/79 mmHg 98.5 F (36.9 C) Oral 101  18  97 %      @flow {1959:LAST@   Intake/Output from previous day:   09/23 0701 - 09/24 0700 In: 2000 [I.V.:2000] Out: 900 [Urine:400]   Intake/Output this shift:       Intake/Output      09/23 0701 - 09/24 0700 09/24 0701 - 09/25 0700  I.V. 2000    Total Intake 2000    Urine 400    Blood 500    Total Output 900    Net +1100            LABORATORY DATA:  Basename 04/01/12 0525 03/28/12 1533  WBC 9.0 10.5  HGB 7.3* 11.4*  HCT 23.0* 35.0*  PLT 242 350    Basename 04/01/12 0525 03/28/12 1533  NA 138 141  K 3.3* 4.2  CL 109 106  CO2 21 26  BUN 11 13  CREATININE 0.69 0.70  GLUCOSE 76 92  CALCIUM 7.3* 9.9   Lab Results  Component Value Date   INR 1.59* 04/01/2012   INR 1.72* 03/28/2012   INR 1.44 09/15/2011    Examination:  General appearance: alert, cooperative and no distress Extremities: Homans sign is negative, no sign of DVT  Wound Exam: clean, dry, intact   Drainage:  None: wound tissue dry  Motor Exam: EHL and FHL Intact  Sensory Exam: Deep Peroneal normal  Vascular Exam:    Assessment:    1 Day Post-Op  Procedure(s) (LRB): IRRIGATION AND DEBRIDEMENT HIP WITH POLY EXCHANGE (Left)  ADDITIONAL DIAGNOSIS:  Active Problems:  * No active hospital problems. *   Acute Blood Loss Anemia   Plan: Physical Therapy as ordered Weight Bearing as Tolerated (WBAT)  DVT Prophylaxis:  Lovenox and Coumadin  DISCHARGE PLAN: Home  DISCHARGE NEEDS: HHPT, HHRN, CPM, Walker and 3-in-1 comode seat  Infectious disease consult         Tywan Siever 04/01/2012, 7:25 AM

## 2012-04-01 NOTE — Clinical Social Work Note (Signed)
Clinical Social Worker received referral for new SNF placement.  Per PA note, patient plan is to discharge home with her grandson and home health PT/OT/RN.  PT recommendations state for home health with possible 24 hour support but will continue to follow for progress.  CM aware of patient needs at this time.    Clinical Social Worker will sign off for now as social work intervention is no longer needed. Please consult [place] again if new need arises.  Macario Golds, LCSW [phone]  ([address] [place], [place] [phone])

## 2012-04-01 NOTE — Progress Notes (Signed)
Utilization review completed.  

## 2012-04-01 NOTE — Op Note (Signed)
Dictation Number: [id number]

## 2012-04-01 NOTE — Progress Notes (Signed)
Agree with PT treatment Note.  [place], [place] DPT [id number]

## 2012-04-01 NOTE — Addendum Note (Signed)
Addendum  created 04/01/12 0801 by Tonita Bills M Tim Corriher, CRNA   Modules edited:Anesthesia Events    

## 2012-04-01 NOTE — Progress Notes (Signed)
Physical Therapy Evaluation Patient Details Name: Renee Adams MRN: [id number] DOB: 1933-09-29 Today's Date: 04/01/2012 Time: 4098-1191 PT Time Calculation (min): 47 min  PT Assessment / Plan / Recommendation Clinical Impression  Pt is a 78 y.o. F s/p L THA.  Pt will benefit from acute physical therapy to increase independence, ambulation, balance, endurance, and safety awareness.  Limited session due to low Hgb and overall fatigue.    PT Assessment  Patient needs continued PT services    Follow Up Recommendations  Home health PT (may needs 24 hour assist at d/c will continue to assess)   Barriers to Discharge Decreased caregiver support Grandson lives with her while enrolled at [facility].  Pt may not be able to receive 24 hour care at home.    Equipment Recommendations  None recommended by PT    Recommendations for Other Services Other (comment) (None)   Frequency 7X/week    Precautions / Restrictions Precautions Precautions: Anterior Hip Restrictions Weight Bearing Restrictions: Yes LLE Weight Bearing: Weight bearing as tolerated   Pertinent Vitals/Pain 1/10 pain in L LE      Mobility  Bed Mobility Bed Mobility: Supine to Sit;Sitting - Scoot to Edge of Bed Supine to Sit: 4: Min assist Sitting - Scoot to [place] of Bed: 4: Min guard Details for Bed Mobility Assistance: (A) with moving the L LE OOB and (A) to lift shoulders into sitting.  Cues for hand placement and proper technique Transfers Transfers: Sit to Stand;Stand to Sit;Stand Pivot Transfers Sit to Stand: 4: Min assist Stand to Sit: 4: Min assist Stand Pivot Transfers: 4: Min assist Details for Transfer Assistance: (A) with RW placement and initiating the transfer.  Cues for proper technique and hand placement Ambulation/Gait Ambulation/Gait Assistance: Not tested (comment) (Due to low hemoglobin) Wheelchair Mobility Wheelchair Mobility: No    Exercises Total Joint Exercises Ankle Circles/Pumps: AROM;Both;10  reps Quad Sets: AROM;Left;5 reps Heel Slides: AROM;Left;10 reps   PT Diagnosis: Difficulty walking;Abnormality of gait;Generalized weakness;Acute pain  PT Problem List: Decreased strength;Decreased range of motion;Decreased activity tolerance;Decreased balance;Decreased mobility;Decreased safety awareness;Pain PT Treatment Interventions: DME instruction;Gait training;Stair training;Functional mobility training;Therapeutic activities;Therapeutic exercise;Balance training   PT Goals Acute Rehab PT Goals PT Goal Formulation: With patient Time For Goal Achievement: 04/08/12 Potential to Achieve Goals: Good Pt will go Supine/Side to Sit: with modified independence;with HOB 0 degrees PT Goal: Supine/Side to Sit - Progress: Goal set today Pt will go Sit to Supine/Side: with modified independence;with HOB 0 degrees PT Goal: Sit to Supine/Side - Progress: Goal set today Pt will go Sit to Stand: with supervision;with upper extremity assist PT Goal: Sit to Stand - Progress: Goal set today Pt will go Stand to Sit: with supervision;with upper extremity assist PT Goal: Stand to Sit - Progress: Goal set today Pt will Transfer Bed to Chair/Chair to Bed: with modified independence PT Transfer Goal: Bed to Chair/Chair to Bed - Progress: Goal set today Pt will Ambulate: 51 - 150 feet;with supervision;with least restrictive assistive device PT Goal: Ambulate - Progress: Goal set today Pt will Go Up / Down Stairs: 1-2 stairs;with min assist;with least restrictive assistive device PT Goal: Up/Down Stairs - Progress: Goal set today Pt will Perform Home Exercise Program: Independently PT Goal: Perform Home Exercise Program - Progress: Goal set today  Visit Information  Last PT Received On: 04/01/12 Assistance Needed: +1    Subjective Data  Subjective: I am feeling pretty good. Patient Stated Goal: To get better   Prior Functioning  Home Living  Lives With: Family (grandson) Available Help at  Discharge: Family;Other (Comment) (grandson) Type of Home: House Home Access: Stairs to enter [employer] of Steps: 2 Entrance Stairs-Rails: None Home Layout: One level Bathroom Shower/Tub: Walk-in shower;Door [employer] Toilet: Handicapped height Bathroom Accessibility: Yes How Accessible: Accessible via walker Home Adaptive Equipment: Bedside commode/3-in-1;Walker - rolling;Shower chair without back;Grab bars in shower;Reacher;Straight cane Prior Function Level of Independence: Independent with assistive device(s) Able to Take Stairs?: Yes Driving: Yes (drove until 7 wks ago due to sickness) Vocation: Retired Musician: No difficulties Dominant Hand: Right    Cognition  Overall Cognitive Status: Appears within functional limits for tasks assessed/performed Arousal/Alertness: Awake/alert Orientation Level: Appears intact for tasks assessed Behavior During Session: [facility] for tasks performed    Extremity/Trunk Assessment     Balance Balance Balance Assessed: No  End of Session PT - End of Session Equipment Utilized During Treatment: Gait belt Activity Tolerance: Patient tolerated treatment well;Patient limited by fatigue Patient left: in chair;with call bell/phone within reach;with family/visitor present Nurse Communication: Mobility status  GP     DITOMMASO, AMY 04/01/2012, 10:02 AM Jake Shark, PT DPT [phone]

## 2012-04-01 NOTE — Addendum Note (Signed)
Addendum  created 04/01/12 0801 by Edmonia Caprio, CRNA   Modules edited:Anesthesia Events

## 2012-04-01 NOTE — Consult Note (Signed)
INFECTIOUS DISEASE CONSULT NOTE  Date of Admission:  03/31/2012  Date of Consult:  04/01/2012  Reason for Consult: Septic Joint Referring Physician: Dr [name] [phone])  Impression/Recommendation Septic Joint , L THR Would Start rifampin Change vanco to ceftriaxone Check ESR and CRP Place PIC  Comment- hopefully will be able to help save her prosthesis. She will most likely need long term po anbx after she completes IV. She has a keflex allergy (n/v) that I suspect is specific to that rx. Will watch her tolerance of ceftriaxone. Watch coumadin dosing while on rifampin.  Discussed with pt and family.   Thank you so much for this interesting consult,   Renee Adams [phone]  Renee Adams is an 78 y.o. female.  HPI: 78 yo F with hx of L and R THR 1997. She had "re-do" of her L hip 2007.  She returned 9-23 after developing pain, swelling and erythema of her L hip since August 2nd. She also had pain and swelling in her L foot. She also has had f/c. She was seen in the outpt clinic and had aspirate of her hip that grew GBS (I-erythro, otherwise sensitive) (03-24-12). She was taken to OR on 9-24 and underwent exchange of parts, wash out.  She also had UTI during this period, she was treated with cipro for 7 days, then 10 days of doxy (completed 9-4).   Past Medical History  Diagnosis Date  . GERD (gastroesophageal reflux disease)   . Glaucoma   . Cancer   . Hypertension   . Depression     Past Surgical History  Procedure Date  . Total knee arthroplasty   . Total hip arthroplasty   . Mastectomy 1996    left breast lumpectomy      Allergies  Allergen Reactions  . Dilaudid (Hydromorphone Hcl) Shortness Of Breath  . Morphine And Related Shortness Of Breath  . Sulfa Antibiotics Other (See Comments)    "Made me drunk";  wobbly  . Keflex (Cephalexin) Nausea Only    Medications:  Scheduled:   . acetaminophen  1,000 mg Intravenous Q6H  . amLODipine  5 mg Oral Daily  .  atorvastatin  10 mg Oral q1800  . buPROPion  300 mg Oral Daily  . calcium carbonate  1 tablet Oral BID WC  . docusate sodium  100 mg Oral BID  . enoxaparin (LOVENOX) injection  30 mg Subcutaneous Q12H  . fentaNYL      . latanoprost  1 drop Both Eyes QHS  . sertraline  150 mg Oral Daily  . vancomycin  1,000 mg Intravenous Q12H  . warfarin  3 mg Oral Once  . warfarin  5 mg Oral ONCE-1800  . Warfarin - Pharmacist Dosing Inpatient   Does not apply q1800  . DISCONTD: acetaminophen  1,000 mg Intravenous Q6H  . DISCONTD: calcium carbonate  600 mg Oral BID WC    Total days of antibiotics 2 (vanco)          Social History:  reports that she has never smoked. She has never used smokeless tobacco. She reports that she does not drink alcohol or use illicit drugs.  Family History  Problem Relation Age of Onset  . Cancer Mother     General ROS: no headaches, no change in vision, no dysphagia, no CP, no SOB, normal BM, no dysuria. F/c. Never took temp.  Blood pressure 109/49, pulse 90, temperature 98.3 F (36.8 C), temperature source Oral, resp. rate 16, SpO2 96.00%. General appearance:  alert, cooperative, appears stated age and no distress Eyes: negative findings: pupils equal, round, reactive to light and accomodation Throat: normal findings: oropharynx pink & moist without lesions or evidence of thrush Neck: no adenopathy and supple, symmetrical, trachea midline Lungs: clear to auscultation bilaterally Heart: regular rate and rhythm and systolic murmur: early systolic 1/6, crescendo at 2nd left intercostal space Abdomen: normal findings: bowel sounds normal and soft, non-tender Extremities: edema none. L hip dressing is clean. Neurologic: Sensory: normal, normal light touch, grossly, in LE   Results for orders placed during the hospital encounter of 03/31/12 (from the past 48 hour(s))  PROTIME-INR     Status: Abnormal   Collection Time   04/01/12  5:25 AM      Component Value Range  Comment   Prothrombin Time 18.5 (*) 11.6 - 15.2 seconds    INR 1.59 (*) 0.00 - 1.49   CBC     Status: Abnormal   Collection Time   04/01/12  5:25 AM      Component Value Range Comment   WBC 9.0  4.0 - 10.5 K/uL    RBC 2.68 (*) 3.87 - 5.11 MIL/uL    Hemoglobin 7.3 (*) 12.0 - 15.0 g/dL    HCT 16.1 (*) 09.6 - 46.0 %    MCV 85.8  78.0 - 100.0 fL    MCH 27.2  26.0 - 34.0 pg    MCHC 31.7  30.0 - 36.0 g/dL    RDW 04.5  40.9 - 81.1 %    Platelets 242  150 - 400 K/uL   BASIC METABOLIC PANEL     Status: Abnormal   Collection Time   04/01/12  5:25 AM      Component Value Range Comment   Sodium 138  135 - 145 mEq/L    Potassium 3.3 (*) 3.5 - 5.1 mEq/L    Chloride 109  96 - 112 mEq/L    CO2 21  19 - 32 mEq/L    Glucose, Bld 76  70 - 99 mg/dL    BUN 11  6 - 23 mg/dL    Creatinine, Ser 9.14  0.50 - 1.10 mg/dL    Calcium 7.3 (*) 8.4 - 10.5 mg/dL    GFR calc non Af Amer 83 (*) >90 mL/min    GFR calc Af Amer >90  >90 mL/min   PREPARE RBC (CROSSMATCH)     Status: Normal   Collection Time   04/01/12  8:30 AM      Component Value Range Comment   Order Confirmation ORDER PROCESSED BY BLOOD BANK     TYPE AND SCREEN     Status: Normal (Preliminary result)   Collection Time   04/01/12  8:30 AM      Component Value Range Comment   ABO/RH(D) AB POS      Antibody Screen NEG      Sample Expiration 04/04/2012      Unit Number [id number]      Blood Component Type RED CELLS,LR      Unit division 00      Status of Unit ISSUED      Transfusion Status OK TO TRANSFUSE      Crossmatch Result Compatible      Unit Number [id number]      Blood Component Type RED CELLS,LR      Unit division 00      Status of Unit ALLOCATED      Transfusion Status OK TO TRANSFUSE  Crossmatch Result Compatible         Component Value Date/Time   SDES URINE, CATHETERIZED 03/28/2012 1605   SPECREQUEST NONE 03/28/2012 1605   CULT Multiple bacterial morphotypes present, none predominant. Suggest appropriate  recollection if clinically indicated. 03/28/2012 1605   REPTSTATUS 03/30/2012 FINAL 03/28/2012 1605   No results found. Recent Results (from the past 240 hour(s))  SURGICAL PCR SCREEN     Status: Normal   Collection Time   03/28/12  3:36 PM      Component Value Range Status Comment   MRSA, PCR NEGATIVE  NEGATIVE Final    Staphylococcus aureus NEGATIVE  NEGATIVE Final   URINE CULTURE     Status: Normal   Collection Time   03/28/12  4:05 PM      Component Value Range Status Comment   Specimen Description URINE, CATHETERIZED   Final    Special Requests NONE   Final    Culture  Setup Time 03/28/2012 17:16   Final    Colony Count 50,000 COLONIES/ML   Final    Culture     Final    Value: Multiple bacterial morphotypes present, none predominant. Suggest appropriate recollection if clinically indicated.   Report Status 03/30/2012 FINAL   Final       04/01/2012, 2:12 PM     LOS: 1 day

## 2012-04-01 NOTE — Progress Notes (Signed)
ANTICOAGULATION CONSULT NOTE - Initial Consult  Pharmacy Consult for Coumadin Indication: Hx CVA; VTE prophylaxis  Allergies  Allergen Reactions  . Dilaudid (Hydromorphone Hcl) Shortness Of Breath  . Morphine And Related Shortness Of Breath  . Sulfa Antibiotics Other (See Comments)    "Made me drunk";  wobbly  . Keflex (Cephalexin) Nausea Only    Patient Measurements:  Wt: 66.4kg    Vital Signs: Temp: 98.4 F (36.9 C) (09/24 0629) Temp src: Oral (09/24 0100) BP: 112/48 mmHg (09/24 0629) Pulse Rate: 87  (09/24 0629)  Labs:  Basename 04/01/12 0525  HGB 7.3*  HCT 23.0*  PLT 242  APTT --  LABPROT 18.5*  INR 1.59*  HEPARINUNFRC --  CREATININE 0.69  CKTOTAL --  CKMB --  TROPONINI --    The CrCl is unknown because both a height and weight (above a minimum accepted value) are required for this calculation.   Medical History: Past Medical History  Diagnosis Date  . GERD (gastroesophageal reflux disease)   . Glaucoma   . Cancer   . Hypertension   . Depression     Medications:  Prescriptions prior to admission  Medication Sig Dispense Refill  . amLODipine (NORVASC) 5 MG tablet Take 5 mg by mouth daily.      Marland Kitchen atorvastatin (LIPITOR) 10 MG tablet Take 10 mg by mouth daily.        Marland Kitchen buPROPion (WELLBUTRIN XL) 300 MG 24 hr tablet Take 300 mg by mouth daily.      . calcium carbonate 200 MG capsule Take 600 mg by mouth 2 (two) times daily with a meal.      . enoxaparin (LOVENOX) 80 MG/0.8ML injection Inject 65 mg into the skin every 12 (twelve) hours.      Marland Kitchen latanoprost (XALATAN) 0.005 % ophthalmic solution Place 1 drop into both eyes at bedtime.      Marland Kitchen rOPINIRole (REQUIP) 1 MG tablet Take 1 mg by mouth at bedtime as needed. For restless legs      . sertraline (ZOLOFT) 100 MG tablet Take 150 mg by mouth daily.      Marland Kitchen warfarin (COUMADIN) 2 MG tablet Take 3 mg by mouth daily. Pt takes 1.5 tab daily        Assessment: Today INR = 1.59, down from preadmission INR of  1.72 on  9/20 in this 77yof whose coumadin was restarted for hx of CVA and for VTE prophylaxis s/p hip procedure on 9/23. Coumadin had been on hold since 9/20 for this procedure). Per patient, home regimen is 3mg  daily. Patient  also  receiving Lovenox 30mg  q12h until INR >1.8.   Hgb decreased to 7.3 and pltc 242 POD#1 (from Hgb 11.4, Plts 350).  No significant bleeding reported  Goal of Therapy:  INR 2-3   Plan:  Coumadin 5 mg po x 1 today Daily AM INR and CBC To continue Lovenox until INR =or> 1.8.   Noah Delaine, RPh Clinical Pharmacist Pager: [phone] 04/01/2012,9:05 AM

## 2012-04-02 ENCOUNTER — Encounter (HOSPITAL_COMMUNITY): Payer: Self-pay | Admitting: Orthopedic Surgery

## 2012-04-02 LAB — CULTURE, ROUTINE-ABSCESS

## 2012-04-02 LAB — CBC
HCT: 30.5 % — ABNORMAL LOW (ref 36.0–46.0)
MCV: 85.9 fL (ref 78.0–100.0)
RBC: 3.55 MIL/uL — ABNORMAL LOW (ref 3.87–5.11)
RDW: 14.7 % (ref 11.5–15.5)
WBC: 9.3 10*3/uL (ref 4.0–10.5)

## 2012-04-02 LAB — TYPE AND SCREEN
ABO/RH(D): AB POS
Antibody Screen: NEGATIVE
Unit division: 0

## 2012-04-02 LAB — BASIC METABOLIC PANEL
BUN: 11 mg/dL (ref 6–23)
CO2: 23 mEq/L (ref 19–32)
Chloride: 104 mEq/L (ref 96–112)
Creatinine, Ser: 0.75 mg/dL (ref 0.50–1.10)

## 2012-04-02 LAB — URINE CULTURE: Culture: NO GROWTH

## 2012-04-02 LAB — C-REACTIVE PROTEIN: CRP: 11.8 mg/dL — ABNORMAL HIGH (ref <0.60)

## 2012-04-02 MED ORDER — SODIUM CHLORIDE 0.9 % IJ SOLN
10.0000 mL | INTRAMUSCULAR | Status: DC | PRN
  Administered 2012-04-03 – 2012-04-04 (×3): 10 mL

## 2012-04-02 NOTE — Progress Notes (Signed)
Georgena Spurling, MD   [name], PA-C [address] [place], [place], [place]  [address]                             [phone]   PROGRESS NOTE  Subjective:  negative for Chest Pain  negative for Shortness of Breath  negative for Nausea/Vomiting   negative for Calf Pain  negative for Bowel Movement   Tolerating Diet: yes         Patient reports pain as 4 on 0-10 scale.    Objective: Vital signs in last 24 hours:   Patient Vitals for the past 24 hrs:  BP Temp Temp src Pulse Resp SpO2  04/02/12 0525 127/69 mmHg 98.4 F (36.9 C) - 88  18  95 %  04/01/12 2100 110/56 mmHg 98.7 F (37.1 C) - 90  19  -  04/01/12 2045 110/56 mmHg 98.6 F (37 C) - 87  18  -  04/01/12 1945 112/55 mmHg 98.5 F (36.9 C) - 90  17  -  04/01/12 1900 101/51 mmHg 98.6 F (37 C) - 85  18  95 %  04/01/12 1845 120/61 mmHg 98.4 F (36.9 C) Oral 92  18  -  04/01/12 1734 103/56 mmHg 99.2 F (37.3 C) Oral 88  16  -  04/01/12 1719 123/70 mmHg 98.2 F (36.8 C) Oral 92  16  -  04/01/12 1553 - - - - 16  -  04/01/12 1510 106/55 mmHg 97.8 F (36.6 C) Oral 87  18  -  04/01/12 1445 103/58 mmHg 98 F (36.7 C) Oral 86  16  -  04/01/12 1345 104/58 mmHg 98.3 F (36.8 C) Oral 88  16  -    @flow {1959:LAST@   Intake/Output from previous day:   09/24 0701 - 09/25 0700 In: 2216.3 [P.O.:980; I.V.:300] Out: 300 [Urine:300]   Intake/Output this shift:       Intake/Output      09/24 0701 - 09/25 0700 09/25 0701 - 09/26 0700   P.O. 980    I.V. 300    Blood 936.3    Total Intake 2216.3    Urine 300    Blood     Total Output 300    Net +1916.3         Urine Occurrence 2 x       LABORATORY DATA:  Basename 04/02/12 0611 04/01/12 0525 03/28/12 1533  WBC 9.3 9.0 10.5  HGB 10.0* 7.3* 11.4*  HCT 30.5* 23.0* 35.0*  PLT 221 242 350    Basename 04/02/12 0611 04/01/12 0525 03/28/12 1533  NA 136 138 141  K 3.7 3.3* 4.2  CL 104 109 106  CO2 23 21 26   BUN 11 11 13   CREATININE 0.75 0.69 0.70  GLUCOSE 97 76  92  CALCIUM 8.3* 7.3* 9.9   Lab Results  Component Value Date   INR 3.02* 04/02/2012   INR 1.59* 04/01/2012   INR 1.72* 03/28/2012    Examination:  General appearance: alert, cooperative and no distress Extremities: Homans sign is negative, no sign of DVT  Wound Exam: clean, dry, intact   Drainage:  None: wound tissue dry  Motor Exam: EHL and FHL Intact  Sensory Exam: Deep Peroneal normal  Vascular Exam:    Assessment:    2 Days Post-Op  Procedure(s) (LRB): IRRIGATION AND DEBRIDEMENT HIP WITH POLY EXCHANGE (Left)  ADDITIONAL DIAGNOSIS:  Active Problems:  * No active hospital  problems. *   Acute Blood Loss Anemia   Plan: Physical Therapy as ordered Weight Bearing as Tolerated (WBAT)  DVT Prophylaxis:  Lovenox  DISCHARGE PLAN: Home  DISCHARGE NEEDS: HHPT, HHRN, Walker and 3-in-1 comode seat         Fransheska Willingham 04/02/2012, 1:40 PM

## 2012-04-02 NOTE — Evaluation (Signed)
Occupational Therapy Evaluation Patient Details Name: Renee Adams MRN: [id number] DOB: 01-Oct-1933 Today's Date: 04/02/2012 Time: 7829-5621 OT Time Calculation (min): 33 min  OT Assessment / Plan / Recommendation Clinical Impression  78 yo female s/p revision of Lt anterior THA that could benefit from skilled OT acutely. Pt could benefit from repetition of precautions with ADLs. Ot to follow acutely    OT Assessment  Patient needs continued OT Services    Follow Up Recommendations  Home health OT    Barriers to Discharge      Equipment Recommendations  None recommended by OT    Recommendations for Other Services    Frequency  Min 2X/week    Precautions / Restrictions Precautions Precautions: Anterior Hip Precaution Comments: handout provided Restrictions Weight Bearing Restrictions: Yes LLE Weight Bearing: Weight bearing as tolerated   Pertinent Vitals/Pain 2-3 out 10 RN Willie providing medication during session      ADL  Eating/Feeding: Performed;Modified independent Where Assessed - Eating/Feeding: Chair Grooming: Performed;Teeth care;Wash/dry face;Minimal assistance Where Assessed - Grooming: Unsupported standing (pt requested (A) with oral care due to grasp) Lower Body Bathing: Performed;Minimal assistance Where Assessed - Lower Body Bathing: Supported sit to stand Lower Body Dressing: Performed;Moderate assistance Where Assessed - Lower Body Dressing: Supported standing Toilet Transfer: Performed;Minimal Dentist Method: Sit to Barista: Raised toilet seat with arms (or 3-in-1 over toilet) Toileting - Clothing Manipulation and Hygiene: Performed;Minimal assistance Where Assessed - Glass blower/designer Manipulation and Hygiene: Sit to stand from 3-in-1 or toilet Equipment Used: Rolling walker;Gait belt Transfers/Ambulation Related to ADLs: Pt ambulated with a decr gait velocity and min v/c for safety with RW. Pt  progressing well with transfers ADL Comments: Pt reluctant for mobility due to reporting still feeling fatigue. pt educated that hgb has increased to 10 which is a good. That now is a good time to try and mobilize and we will move at the patients pace. Pt progressed to toilet transfer , LB bathing on toilet, sink level grooming and then transfer to chair beside bed. Pt with decr grasp and uses an electric tooth brush at home. Pt requires extended time but progressing wtih ADLS. Pt provided mesh panties with pad in standing with Mod (A) to don due to urine incontinence. Pt educated Ot will set a goal for LB dressing without (A). 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 grasp)    OT Diagnosis: Generalized weakness;Acute pain  OT Problem List: Decreased strength;Impaired balance (sitting and/or standing);Decreased safety awareness;Decreased knowledge of use of DME or AE;Decreased knowledge of precautions;Pain OT Treatment Interventions: Self-care/ADL training;Therapeutic exercise;DME and/or AE instruction;Therapeutic activities;Patient/family education;Balance training   OT Goals Acute Rehab OT Goals OT Goal Formulation: With patient Time For Goal Achievement: 04/16/12 Potential to Achieve Goals: Good ADL Goals Pt Will Perform Lower Body Bathing: with modified independence;Sit to stand from chair;Sit to stand from bed;with adaptive equipment ADL Goal: Lower Body Bathing - Progress: Goal set today Pt Will Perform Lower Body Dressing: with modified independence;Sit to stand from chair;Sit to stand from bed;with adaptive equipment ADL Goal: Lower Body Dressing - Progress: Goal set today Pt Will Transfer to Toilet: with modified independence;Ambulation;3-in-1 ADL Goal: Toilet Transfer - Progress: Goal set today Miscellaneous OT Goals Miscellaneous OT Goal #1: Pt will complete bed mobility Mod I as precursor for adls OT Goal: Miscellaneous Goal #1 - Progress: Goal set today  Visit Information   Last OT Received On: 04/02/12 Assistance Needed: +1 PT/OT Co-Evaluation/Treatment: Yes  Subjective Data  Subjective: "I have to walk all the way in there"- pt anxious to ambulate to bathroom level  Patient Stated Goal: to put on underwear   Prior Functioning  Vision/Perception  Home Living Lives With: Family Available Help at Discharge: Family;Other (Comment) Type of Home: House Home Access: Stairs to enter [employer] of Steps: 2 Entrance Stairs-Rails: None Home Layout: One level Bathroom Shower/Tub: Walk-in shower;Door [employer] Toilet: Handicapped height Bathroom Accessibility: Yes How Accessible: Accessible via walker Home Adaptive Equipment: Bedside commode/3-in-1;Walker - rolling;Shower chair without back;Grab bars in shower;Reacher;Straight cane Prior Function Level of Independence: Independent with assistive device(s) Able to Take Stairs?: Yes Driving: Yes Vocation: Retired Musician: No difficulties Dominant Hand: Right      Cognition  Overall Cognitive Status: Appears within functional limits for tasks assessed/performed Arousal/Alertness: Awake/alert Orientation Level: Appears intact for tasks assessed Behavior During Session: [facility] for tasks performed    Extremity/Trunk Assessment Right Upper Extremity Assessment RUE ROM/Strength/Tone: Within functional levels RUE Coordination: Deficits Left Upper Extremity Assessment LUE ROM/Strength/Tone: Within functional levels LUE Coordination: Deficits   Mobility  Shoulder Instructions  Bed Mobility Supine to Sit: 4: Min assist Sitting - Scoot to Edge of Bed: 4: Min guard Sit to Supine: 4: Min assist Details for Bed Mobility Assistance: min v/c for hand placement and encouragement to progress Transfers Sit to Stand: 4: Min assist Stand to Sit: 4: Min assist Details for Transfer Assistance: min v/c for RW sequence with transfers       Exercise     Balance     End of Session  OT - End of Session Activity Tolerance: Patient tolerated treatment well Patient left: in chair;with call bell/phone within reach Nurse Communication: Mobility status;Precautions  GO     Lucile Shutters 04/02/2012, 11:36 AM Pager: [phone]

## 2012-04-02 NOTE — Progress Notes (Signed)
Peripherally Inserted Central Catheter/Midline Placement  The IV Nurse has discussed with the patient and/or persons authorized to consent for the patient, the purpose of this procedure and the potential benefits and risks involved with this procedure.  The benefits include less needle sticks, lab draws from the catheter and patient may be discharged home with the catheter.  Risks include, but not limited to, infection, bleeding, blood clot (thrombus formation), and puncture of an artery; nerve damage and irregular heat beat.  Alternatives to this procedure were also discussed.  PICC/Midline Placement Documentation        Renee Adams 04/02/2012, 2:18 PM

## 2012-04-02 NOTE — Progress Notes (Signed)
Agree with PT treatment note.  Taylin Leder, PT DPT [phone]  

## 2012-04-02 NOTE — Op Note (Signed)
[name], Renee Adams               ACCOUNT NO.:  [id number]  MEDICAL RECORD NO.:  [id number]  LOCATION:  5N13C                        FACILITY:  [facility]  PHYSICIAN:  Mila Homer. [name], M.D. DATE OF BIRTH:  July 05, 1934  DATE OF PROCEDURE:  03/31/2012 DATE OF DISCHARGE:                              OPERATIVE REPORT   SURGEON:  Mila Homer. [name], MD  ASSISTANT:  Laural Benes. Su Hilt, PA-C  ANESTHESIA:  General.  PREOPERATIVE DIAGNOSIS:  Left hip infection.  POSTOPERATIVE DIAGNOSIS:  Left hip infection.  PROCEDURE:  Left revision total hip arthroplasty at beside including acetabular and femoral ball exchange as well as deep irrigation and debridement.  INDICATION FOR PROCEDURE:  The patient is a 78 year old who had an acute bladder infection less than 2 months ago.  I then centered for aspiration, which did grow out a bacteria.  She continued to have pain and clinical evidence of superficial infection as well, and informed consent was obtained.  DESCRIPTION OF PROCEDURE:  The patient was laid supine, administered general anesthesia and then placed in the right down and left up lateral decubitus position.  I made an incision using the old incision and sent off cultures immediately.  We washed out about 40-60 mL of purulence. This was in the distal one-third of the incision, but it went through the fascia lata and down into the hip joint.  I then developed planes with clean blade, I debrided all of the tract of the infection.  I then removed the short external rotators from the lateral femur.  We entered the joint and washed that as well.  With some difficulty, I was able to remove the tripolar locking device.  I then placed the femoral neck anteriorly, debrided around the acetabulum, debrided out the holes as well, very meticulous debridement and lavaging over 6000 mL of normal saline through the joint and the soft tissues.  I then reassembled with the new plastic liner, new 28 head and  continued to irrigated with another 3000 mL and then I closed the short external rotators with #1 Prolene.  I then closed the deep soft tissues with buried 0 PDS, 2-0 subcuticular PDS and skin staples.  I then dressed with Xeroform and Mepilex dressing.  COMPLICATIONS:  None.  DRAINS:  None.  EBL:  500 mL.          ______________________________ Mila Homer. [name], M.D.     SDL/MEDQ  D:  04/01/2012  T:  04/02/2012  Job:  [id number]

## 2012-04-02 NOTE — Progress Notes (Signed)
ANTICOAGULATION CONSULT NOTE - Initial Consult  Pharmacy Consult for Coumadin Indication: Hx CVA; VTE prophylaxis  Allergies  Allergen Reactions  . Dilaudid (Hydromorphone Hcl) Shortness Of Breath  . Morphine And Related Shortness Of Breath  . Sulfa Antibiotics Other (See Comments)    "Made me drunk";  wobbly  . Keflex (Cephalexin) Nausea Only    Patient Measurements:  Wt: 66.4kg    Vital Signs: Temp: 98.4 F (36.9 C) (09/25 0525) BP: 127/69 mmHg (09/25 0525) Pulse Rate: 88  (09/25 0525)  Labs:  Alvira Philips 04/02/12 0611 04/01/12 0525  HGB 10.0* 7.3*  HCT 30.5* 23.0*  PLT 221 242  APTT -- --  LABPROT 29.7* 18.5*  INR 3.02* 1.59*  HEPARINUNFRC -- --  CREATININE 0.75 0.69  CKTOTAL -- --  CKMB -- --  TROPONINI -- --    The CrCl is unknown because both a height and weight (above a minimum accepted value) are required for this calculation.   Medical History: Past Medical History  Diagnosis Date  . GERD (gastroesophageal reflux disease)   . Glaucoma   . Cancer   . Hypertension   . Depression     Medications:  Prescriptions prior to admission  Medication Sig Dispense Refill  . amLODipine (NORVASC) 5 MG tablet Take 5 mg by mouth daily.      Marland Kitchen atorvastatin (LIPITOR) 10 MG tablet Take 10 mg by mouth daily.        Marland Kitchen buPROPion (WELLBUTRIN XL) 300 MG 24 hr tablet Take 300 mg by mouth daily.      . calcium carbonate 200 MG capsule Take 600 mg by mouth 2 (two) times daily with a meal.      . enoxaparin (LOVENOX) 80 MG/0.8ML injection Inject 65 mg into the skin every 12 (twelve) hours.      Marland Kitchen latanoprost (XALATAN) 0.005 % ophthalmic solution Place 1 drop into both eyes at bedtime.      Marland Kitchen rOPINIRole (REQUIP) 1 MG tablet Take 1 mg by mouth at bedtime as needed. For restless legs      . sertraline (ZOLOFT) 100 MG tablet Take 150 mg by mouth daily.      Marland Kitchen warfarin (COUMADIN) 2 MG tablet Take 3 mg by mouth daily. Pt takes 1.5 tab daily        Assessment: Today INR  increased to 3.02 up from 1.59 yesterday in this 78yo female whose coumadin was restarted post op for hx of CVA and for VTE prophylaxis s/p hip procedure on 9/23. Preadmit INR was 1.72 on 9/20. Coumadin had been on hold since 9/20 for this procedure). Per patient, home regimen is 3mg  daily. Lovenox 30mg  q12h until INR >1.8 which will be discontinued today as INR is 3.02.   Hgb increased to 10.0 and pltc stable 221K POD#2.  No significant bleeding reported.   Goal of Therapy:  INR 2-3   Plan:  No Coumadin today.  Daily AM INR and CBC DC'd Lovenox per MD's order to DC lovenox when INR =or> 1.8.   Noah Delaine, RPh Clinical Pharmacist Pager: [phone] 04/02/2012,10:48 AM

## 2012-04-02 NOTE — Progress Notes (Signed)
Physical Therapy Progress Note   04/02/12 1300  PT Visit Information  Last PT Received On 04/02/12  Assistance Needed +1  PT Time Calculation  PT Start Time 1334  PT Stop Time 1350  PT Time Calculation (min) 16 min  Subjective Data  Subjective Still very tired  Patient Stated Goal To get better  Precautions  Precautions Anterior Hip  Restrictions  Weight Bearing Restrictions Yes  LLE Weight Bearing WBAT  Cognition  Overall Cognitive Status Appears within functional limits for tasks assessed/performed  Arousal/Alertness Awake/alert  Orientation Level Appears intact for tasks assessed  Behavior During Session [facility] for tasks performed  Bed Mobility  Bed Mobility Sit to Supine  Sit to Supine 4: Min assist;HOB flat  Details for Bed Mobility Assistance (A) lifting L LE into bed and cues for proper technique  Transfers  Transfers Sit to Stand;Stand to Sit;Stand Pivot Transfers  Sit to Stand 4: Min guard;From chair/3-in-1  Stand to Sit 4: Min guard;To bed  Stand Pivot Transfers 4: Min guard  Details for Transfer Assistance Cues for RW placement, precautions, and proper technique  Ambulation/Gait  Ambulation/Gait Assistance Not tested (comment)  Stairs No  Wheelchair Mobility  Wheelchair Mobility No  PT - End of Session  Equipment Utilized During Treatment Gait belt  Activity Tolerance Patient tolerated treatment well;Patient limited by fatigue  Patient left in bed;with call bell/phone within reach;with family/visitor present;with nursing in room  Nurse Communication Mobility status  PT - Assessment/Plan  Comments on Treatment Session Pt c/o feeling fatigued and cold.  Pt wanted to return to bed and refused to practice stair negotiation.  Plan for next session is to negotiate stairs and ambulate a futher distance.  PT Plan Discharge plan remains appropriate;Frequency remains appropriate  PT Frequency 7X/week  Recommendations for Other Services Other (comment) (None)  Follow Up  Recommendations Home health PT  Equipment Recommended None recommended by PT  Acute Rehab PT Goals  PT Goal Formulation With patient  Time For Goal Achievement 04/08/12  Potential to Achieve Goals Good  Pt will go Sit to Supine/Side with modified independence;with HOB 0 degrees  PT Goal: Sit to Supine/Side - Progress Progressing toward goal  Pt will go Sit to Stand with supervision;with upper extremity assist  PT Goal: Sit to Stand - Progress Progressing toward goal  Pt will go Stand to Sit with supervision;with upper extremity assist  PT Goal: Stand to Sit - Progress Progressing toward goal  Pt will Transfer Bed to Chair/Chair to Bed with modified independence  PT Transfer Goal: Bed to Chair/Chair to Bed - Progress Progressing toward goal    4/10 pain in L LE  Yifan Auker, SPT

## 2012-04-02 NOTE — Progress Notes (Signed)
Agree with PT treatment note.  Mahogany Torrance, PT DPT [phone]  

## 2012-04-02 NOTE — Progress Notes (Signed)
INFECTIOUS DISEASE PROGRESS NOTE  ID: Renee Adams is a 78 y.o. female with   Active Problems:  * No active hospital problems. *   Subjective: Without complaints. Had orange urine today  Abtx:  Anti-infectives     Start     Dose/Rate Route Frequency Ordered Stop   04/01/12 1500   cefTRIAXone (ROCEPHIN) 1 g in dextrose 5 % 50 mL IVPB        1 g 100 mL/hr over 30 Minutes Intravenous Every 24 hours 04/01/12 1448     04/01/12 1500   rifampin (RIFADIN) capsule 300 mg        300 mg Oral Daily 04/01/12 1448     04/01/12 0000   vancomycin (VANCOCIN) IVPB 1000 mg/200 mL premix        1,000 mg 200 mL/hr over 60 Minutes Intravenous Every 12 hours 03/31/12 2008 04/01/12 0014          Medications:  Scheduled:   . acetaminophen  1,000 mg Intravenous Q6H  . amLODipine  5 mg Oral Daily  . atorvastatin  10 mg Oral q1800  . buPROPion  300 mg Oral Daily  . calcium carbonate  1 tablet Oral BID WC  . cefTRIAXone (ROCEPHIN)  IV  1 g Intravenous Q24H  . docusate sodium  100 mg Oral BID  . latanoprost  1 drop Both Eyes QHS  . rifampin  300 mg Oral Daily  . sertraline  150 mg Oral Daily  . warfarin  5 mg Oral ONCE-1800  . Warfarin - Pharmacist Dosing Inpatient   Does not apply q1800  . DISCONTD: enoxaparin (LOVENOX) injection  30 mg Subcutaneous Q12H    Objective: Vital signs in last 24 hours: Temp:  [98.2 F (36.8 C)-99.2 F (37.3 C)] 98.4 F (36.9 C) (09/25 0525) Pulse Rate:  [85-92] 88  (09/25 0525) Resp:  [16-19] 18  (09/25 0525) BP: (101-127)/(51-70) 127/69 mmHg (09/25 0525) SpO2:  [95 %] 95 % (09/25 0525)   General appearance: alert, cooperative and no distress Extremities: L hip dressed, clean.  Lab Results  [facility] 04/02/12 0611 04/01/12 0525  WBC 9.3 9.0  HGB 10.0* 7.3*  HCT 30.5* 23.0*  NA 136 138  K 3.7 3.3*  CL 104 109  CO2 23 21  BUN 11 11  CREATININE 0.75 0.69  GLU -- --   Liver Panel No results found for this basename:  PROT:2,ALBUMIN:2,AST:2,ALT:2,ALKPHOS:2,BILITOT:2,BILIDIR:2,IBILI:2 in the last 72 hours Sedimentation Rate  Basename 04/01/12 2140  ESRSEDRATE 32*   C-Reactive Protein  Basename 04/01/12 2140  CRP 11.8*    Microbiology: Recent Results (from the past 240 hour(s))  SURGICAL PCR SCREEN     Status: Normal   Collection Time   03/28/12  3:36 PM      Component Value Range Status Comment   MRSA, PCR NEGATIVE  NEGATIVE Final    Staphylococcus aureus NEGATIVE  NEGATIVE Final   URINE CULTURE     Status: Normal   Collection Time   03/28/12  4:05 PM      Component Value Range Status Comment   Specimen Description URINE, CATHETERIZED   Final    Special Requests NONE   Final    Culture  Setup Time 03/28/2012 17:16   Final    Colony Count 50,000 COLONIES/ML   Final    Culture     Final    Value: Multiple bacterial morphotypes present, none predominant. Suggest appropriate recollection if clinically indicated.   Report Status 03/30/2012 FINAL  Final   URINE CULTURE     Status: Normal   Collection Time   03/31/12 12:31 PM      Component Value Range Status Comment   Specimen Description URINE, CATHETERIZED   Final    Special Requests NONE   Final    Culture  Setup Time 03/31/2012 13:02   Final    Colony Count NO GROWTH   Final    Culture NO GROWTH   Final    Report Status 04/02/2012 FINAL   Final   CULTURE, ROUTINE-ABSCESS     Status: Normal   Collection Time   03/31/12 12:32 PM      Component Value Range Status Comment   Specimen Description ABSCESS LEFT HIP   Final    Special Requests NONE   Final    Gram Stain     Final    Value: ABUNDANT WBC PRESENT, PREDOMINANTLY PMN     NO SQUAMOUS EPITHELIAL CELLS SEEN     RARE GRAM POSITIVE COCCI IN PAIRS   Culture     Final    Value: MODERATE GROUP B STREP(S.AGALACTIAE)ISOLATED     Note: TESTING AGAINST S. AGALACTIAE NOT ROUTINELY PERFORMED DUE TO PREDICTABILITY OF AMP/PEN/VAN SUSCEPTIBILITY.   Report Status 04/02/2012 FINAL   Final     ANAEROBIC CULTURE     Status: Normal (Preliminary result)   Collection Time   03/31/12 12:32 PM      Component Value Range Status Comment   Specimen Description ABSCESS LEFT HIP   Final    Special Requests NONE   Final    Gram Stain PENDING   Incomplete    Culture     Final    Value: NO ANAEROBES ISOLATED; CULTURE IN PROGRESS FOR 5 DAYS   Report Status PENDING   Incomplete     Studies/Results: No results found.   Assessment/Plan: L hip/THR infection GBS Exchange of parts, I & D 9-24 Total days of antibiotics 2 (ceftriaxone/rifampin) Plan for 42 days of total anbx She is tolerating cephalosporin and rifampin well so far.  Will see her in ID clinic in f/u in 2-3 weeks.  Have asked her to call in the intervening period if she has any problems (rash, diarrhea, fever)         [name] Infectious Diseases [phone] 04/02/2012, 4:51 PM   LOS: 2 days

## 2012-04-02 NOTE — Progress Notes (Signed)
Physical Therapy Progress Note   04/02/12 1200  PT Visit Information  Last PT Received On 04/02/12  Assistance Needed +1  PT/OT Co-Evaluation/Treatment Yes  PT Time Calculation  PT Start Time 0956  PT Stop Time 1045  PT Time Calculation (min) 49 min  Subjective Data  Subjective Tired and not feeling any better  Patient Stated Goal To get better  Precautions  Precautions Anterior Hip  Precaution Comments handout provided  Restrictions  Weight Bearing Restrictions Yes  LLE Weight Bearing WBAT  Cognition  Overall Cognitive Status Appears within functional limits for tasks assessed/performed  Arousal/Alertness Awake/alert  Orientation Level Appears intact for tasks assessed  Behavior During Session [facility] for tasks performed  Bed Mobility  Bed Mobility Supine to Sit;Sitting - Scoot to Edge of Bed  Supine to Sit 4: Min guard  Sitting - Scoot to [place] of Bed 4: Min guard  Details for Bed Mobility Assistance Cues for proper technique and hand placement  Transfers  Transfers Sit to Stand;Stand to Sit  Sit to Stand 4: Min assist;From bed;From toilet  Stand to Sit 4: Min assist;To chair/3-in-1;To toilet  Details for Transfer Assistance Cues for RW, proper technique, and safety  Ambulation/Gait  Ambulation/Gait Assistance 4: Min assist  Ambulation Distance (Feet) 25 Feet  Assistive device Rolling walker  Ambulation/Gait Assistance Details (A) RW placement.  Cues for propre technique and safety  Gait Pattern Step-to pattern;Decreased stride length;Antalgic  Gait velocity decreased  Stairs No  Wheelchair Mobility  Wheelchair Mobility No  Exercises  Exercises Total Joint  Total Joint Exercises  Ankle Circles/Pumps AROM;Both;10 reps  Quad Sets AROM;Left;10 reps  Heel Slides AROM;Left;10 reps  Short Arc Quad AROM;Left;10 reps  PT - End of Session  Equipment Utilized During Treatment Gait belt  Activity Tolerance Patient tolerated treatment well;Patient limited by fatigue  Patient  left in chair;with call bell/phone within reach  Nurse Communication Mobility status  PT - Assessment/Plan  Comments on Treatment Session Pt c/o feeling fatigued.  Pt increased ambulation distance and gait quality.  PT Plan Discharge plan remains appropriate;Frequency remains appropriate  PT Frequency 7X/week  Recommendations for Other Services Other (comment) (None)  Follow Up Recommendations Home health PT  Equipment Recommended None recommended by PT  Acute Rehab PT Goals  PT Goal Formulation With patient  Time For Goal Achievement 04/08/12  Potential to Achieve Goals Good  Pt will go Supine/Side to Sit with modified independence;with HOB 0 degrees  PT Goal: Supine/Side to Sit - Progress Progressing toward goal  Pt will go Sit to Stand with supervision;with upper extremity assist  PT Goal: Sit to Stand - Progress Progressing toward goal  Pt will go Stand to Sit with supervision;with upper extremity assist  PT Goal: Stand to Sit - Progress Progressing toward goal  Pt will Ambulate 51 - 150 feet;with supervision;with least restrictive assistive device  PT Goal: Ambulate - Progress Progressing toward goal  Pt will Perform Home Exercise Program Independently  PT Goal: Perform Home Exercise Program - Progress Progressing toward goal    Pt reports pain 2/10 for L LE.  Latron Ribas, SPT

## 2012-04-02 NOTE — Progress Notes (Signed)
INFECTIOUS DISEASE PROGRESS NOTE  ID: Renee Adams is a 78 y.o. female with   Active Problems:  * No active hospital problems. *   Subjective:   Abtx:  Anti-infectives     Start     Dose/Rate Route Frequency Ordered Stop   04/01/12 1500   cefTRIAXone (ROCEPHIN) 1 g in dextrose 5 % 50 mL IVPB        1 g 100 mL/hr over 30 Minutes Intravenous Every 24 hours 04/01/12 1448     04/01/12 1500   rifampin (RIFADIN) capsule 300 mg        300 mg Oral Daily 04/01/12 1448     04/01/12 0000   vancomycin (VANCOCIN) IVPB 1000 mg/200 mL premix        1,000 mg 200 mL/hr over 60 Minutes Intravenous Every 12 hours 03/31/12 2008 04/01/12 0014          Medications:  Scheduled:   . acetaminophen  1,000 mg Intravenous Q6H  . amLODipine  5 mg Oral Daily  . atorvastatin  10 mg Oral q1800  . buPROPion  300 mg Oral Daily  . calcium carbonate  1 tablet Oral BID WC  . cefTRIAXone (ROCEPHIN)  IV  1 g Intravenous Q24H  . docusate sodium  100 mg Oral BID  . latanoprost  1 drop Both Eyes QHS  . rifampin  300 mg Oral Daily  . sertraline  150 mg Oral Daily  . warfarin  5 mg Oral ONCE-1800  . Warfarin - Pharmacist Dosing Inpatient   Does not apply q1800  . DISCONTD: enoxaparin (LOVENOX) injection  30 mg Subcutaneous Q12H    Objective: Vital signs in last 24 hours: Temp:  [98.2 F (36.8 C)-99.2 F (37.3 C)] 98.4 F (36.9 C) (09/25 0525) Pulse Rate:  [85-92] 88  (09/25 0525) Resp:  [16-19] 18  (09/25 0525) BP: (101-127)/(51-70) 127/69 mmHg (09/25 0525) SpO2:  [95 %] 95 % (09/25 0525)   this is a duplicate note  Lab Results  Basename 04/02/12 0611 04/01/12 0525  WBC 9.3 9.0  HGB 10.0* 7.3*  HCT 30.5* 23.0*  NA 136 138  K 3.7 3.3*  CL 104 109  CO2 23 21  BUN 11 11  CREATININE 0.75 0.69  GLU -- --   Liver Panel No results found for this basename: PROT:2,ALBUMIN:2,AST:2,ALT:2,ALKPHOS:2,BILITOT:2,BILIDIR:2,IBILI:2 in the last 72 hours Sedimentation Rate  Basename 04/01/12  2140  ESRSEDRATE 32*   C-Reactive Protein  Basename 04/01/12 2140  CRP 11.8*    Microbiology: Recent Results (from the past 240 hour(s))  SURGICAL PCR SCREEN     Status: Normal   Collection Time   03/28/12  3:36 PM      Component Value Range Status Comment   MRSA, PCR NEGATIVE  NEGATIVE Final    Staphylococcus aureus NEGATIVE  NEGATIVE Final   URINE CULTURE     Status: Normal   Collection Time   03/28/12  4:05 PM      Component Value Range Status Comment   Specimen Description URINE, CATHETERIZED   Final    Special Requests NONE   Final    Culture  Setup Time 03/28/2012 17:16   Final    Colony Count 50,000 COLONIES/ML   Final    Culture     Final    Value: Multiple bacterial morphotypes present, none predominant. Suggest appropriate recollection if clinically indicated.   Report Status 03/30/2012 FINAL   Final   URINE CULTURE     Status: Normal  Collection Time   03/31/12 12:31 PM      Component Value Range Status Comment   Specimen Description URINE, CATHETERIZED   Final    Special Requests NONE   Final    Culture  Setup Time 03/31/2012 13:02   Final    Colony Count NO GROWTH   Final    Culture NO GROWTH   Final    Report Status 04/02/2012 FINAL   Final   CULTURE, ROUTINE-ABSCESS     Status: Normal   Collection Time   03/31/12 12:32 PM      Component Value Range Status Comment   Specimen Description ABSCESS LEFT HIP   Final    Special Requests NONE   Final    Gram Stain     Final    Value: ABUNDANT WBC PRESENT, PREDOMINANTLY PMN     NO SQUAMOUS EPITHELIAL CELLS SEEN     RARE GRAM POSITIVE COCCI IN PAIRS   Culture     Final    Value: MODERATE GROUP B STREP(S.AGALACTIAE)ISOLATED     Note: TESTING AGAINST S. AGALACTIAE NOT ROUTINELY PERFORMED DUE TO PREDICTABILITY OF AMP/PEN/VAN SUSCEPTIBILITY.   Report Status 04/02/2012 FINAL   Final   ANAEROBIC CULTURE     Status: Normal (Preliminary result)   Collection Time   03/31/12 12:32 PM      Component Value Range Status  Comment   Specimen Description ABSCESS LEFT HIP   Final    Special Requests NONE   Final    Gram Stain PENDING   Incomplete    Culture     Final    Value: NO ANAEROBES ISOLATED; CULTURE IN PROGRESS FOR 5 DAYS   Report Status PENDING   Incomplete     Studies/Results: No results found.   Assessment/Plan: Septic Joint , L THR  Cx GBS  I & D, exchange of parts 9-24  Total days of antibiotics 2 (ceftriaxone/rifampin)          [name] Infectious Diseases [phone] 04/02/2012, 4:38 PM   LOS: 2 days

## 2012-04-02 NOTE — Progress Notes (Signed)
CARE MANAGEMENT NOTE 04/02/2012  Patient:  Renee Adams, Renee Adams   Account Number:  [id number]  Date Initiated:  04/02/2012  Documentation initiated by:  Vance Peper  Subjective/Objective Assessment:   78 yr old female s/p I & D with Poly exchange.     Action/Plan:   Patient will go home with PICC for IV antibiotics. Preoperatively setup with [facility], no changes.   Anticipated DC Date:  04/03/2012   Anticipated DC Plan:  HOME W HOME HEALTH SERVICES      DC Planning Services  CM consult      [facility] Choice  HOME HEALTH   Choice offered to / List presented to:          [facility] arranged  HH-1 RN  HH-2 PT  IV Antibiotics      HH agency  [facility]   Status of service:  Completed, signed off Medicare Important Message given?   (If response is "NO", the following Medicare IM given date fields will be blank) Date Medicare IM given:   Date Additional Medicare IM given:    Discharge Disposition:  HOME W HOME HEALTH SERVICES  Per UR Regulation:    If discussed at Long Length of Stay Meetings, dates discussed:    Comments:

## 2012-04-02 NOTE — Progress Notes (Signed)
CARE MANAGEMENT NOTE 04/02/2012  Patient:  MAGARET, JUSTO   Account Number:  [id number]  Date Initiated:  04/02/2012  Documentation initiated by:  Vance Peper  Subjective/Objective Assessment:   78 yr old female s/p I & D with Poly exchange.     Action/Plan:   Patient will go home with PICC for IV antibiotics. Preoperatively setup with [facility], no changes.   Anticipated DC Date:  04/03/2012   Anticipated DC Plan:  HOME W HOME HEALTH SERVICES      DC Planning Services  CM consult      [facility] Choice  HOME HEALTH   Choice offered to / List presented to:          [facility] arranged  HH-1 RN  HH-2 PT  IV Antibiotics      HH agency  [facility]   Status of service:  In process, will continue to follow Medicare Important Message given?   (If response is "NO", the following Medicare IM given date fields will be blank) Date Medicare IM given:   Date Additional Medicare IM given:    Discharge Disposition:  HOME W HOME HEALTH SERVICES  Per UR Regulation:    If discussed at Long Length of Stay Meetings, dates discussed:    Comments:

## 2012-04-03 LAB — CBC
HCT: 29.5 % — ABNORMAL LOW (ref 36.0–46.0)
Hemoglobin: 9.9 g/dL — ABNORMAL LOW (ref 12.0–15.0)
MCV: 85 fL (ref 78.0–100.0)
RBC: 3.47 MIL/uL — ABNORMAL LOW (ref 3.87–5.11)
WBC: 7.9 10*3/uL (ref 4.0–10.5)

## 2012-04-03 LAB — PROTIME-INR: INR: 3.54 — ABNORMAL HIGH (ref 0.00–1.49)

## 2012-04-03 LAB — BASIC METABOLIC PANEL
BUN: 7 mg/dL (ref 6–23)
CO2: 24 mEq/L (ref 19–32)
Chloride: 106 mEq/L (ref 96–112)
Creatinine, Ser: 0.61 mg/dL (ref 0.50–1.10)
GFR calc Af Amer: >90 mL/min (ref >90)
Glucose, Bld: 98 mg/dL (ref 70–99)
Potassium: 3.3 mEq/L — ABNORMAL LOW (ref 3.5–5.1)

## 2012-04-03 MED ORDER — ALPRAZOLAM 0.25 MG PO TABS
0.2500 mg | ORAL_TABLET | ORAL | Status: DC | PRN
  Administered 2012-04-03 – 2012-04-04 (×5): 0.25 mg via ORAL
  Filled 2012-04-03 (×5): qty 1

## 2012-04-03 MED ORDER — ONDANSETRON HCL 4 MG PO TABS
4.0000 mg | ORAL_TABLET | ORAL | Status: DC
  Administered 2012-04-04 (×4): 4 mg via ORAL
  Filled 2012-04-03 (×8): qty 1

## 2012-04-03 MED ORDER — ROPINIROLE HCL 1 MG PO TABS
1.0000 mg | ORAL_TABLET | Freq: Every evening | ORAL | Status: DC | PRN
  Administered 2012-04-03: 1 mg via ORAL
  Filled 2012-04-03 (×4): qty 1

## 2012-04-03 MED ORDER — ONDANSETRON HCL 4 MG/2ML IJ SOLN
4.0000 mg | INTRAMUSCULAR | Status: DC
  Administered 2012-04-03: 4 mg via INTRAVENOUS
  Filled 2012-04-03: qty 2

## 2012-04-03 NOTE — Progress Notes (Signed)
Occupational Therapy Treatment Patient Details Name: Renee Adams MRN: [id number] DOB: Nov 23, 1933 Today's Date: 04/03/2012 Time: 4098-1191 OT Time Calculation (min): 23 min  OT Assessment / Plan / Recommendation Comments on Treatment Session Pt progressing with therapy though required encouragement to participate. Pt reports feeling fatigued at this time.     Follow Up Recommendations  Home health OT    Barriers to Discharge       Equipment Recommendations  None recommended by OT    Recommendations for Other Services    Frequency Min 2X/week   Plan Discharge plan remains appropriate    Precautions / Restrictions Precautions Precautions: Anterior Hip Restrictions Weight Bearing Restrictions: Yes LLE Weight Bearing: Weight bearing as tolerated   Pertinent Vitals/Pain None reported at this time RN beth in room during session    ADL  Lower Body Dressing: Performed;Supervision/safety (using sock aide and reacher) Where Assessed - Lower Body Dressing: Unsupported sitting Toilet Transfer: Simulated;Supervision/safety Toilet Transfer Method: Sit to stand Toilet Transfer Equipment: Raised toilet seat with arms (or 3-in-1 over toilet) Equipment Used: Rolling walker Transfers/Ambulation Related to ADLs: Pt ambulating with Supervision ADL Comments: Pt and daughter educated on sock aide and anterior hip precautions. Pt unable to recall any precautions. Pt reminded of handout. Pt given questioning cues. pt unable to recall. pt is high risk for dislocation due to poor recall. Daughter educated on this and educated on helping reinforce precautions.     OT Goals Acute Rehab OT Goals OT Goal Formulation: With patient Time For Goal Achievement: 04/16/12 Potential to Achieve Goals: Good ADL Goals Pt Will Perform Lower Body Bathing: with modified independence;Sit to stand from chair;Sit to stand from bed;with adaptive equipment ADL Goal: Lower Body Bathing - Progress: Progressing  toward goals Pt Will Perform Lower Body Dressing: with modified independence;Sit to stand from chair;Sit to stand from bed;with adaptive equipment ADL Goal: Lower Body Dressing - Progress: Progressing toward goals Pt Will Transfer to Toilet: with modified independence;Ambulation;3-in-1 ADL Goal: Toilet Transfer - Progress: Progressing toward goals Miscellaneous OT Goals Miscellaneous OT Goal #1: Pt will complete bed mobility Mod I as precursor for adls OT Goal: Miscellaneous Goal #1 - Progress: Met  Visit Information  Last OT Received On: 04/03/12 Assistance Needed: +1    Subjective Data      Prior Functioning       Cognition  Overall Cognitive Status: Appears within functional limits for tasks assessed/performed Arousal/Alertness: Awake/alert Orientation Level: Appears intact for tasks assessed Behavior During Session: [facility] for tasks performed    Mobility  Shoulder Instructions Bed Mobility Bed Mobility: Not assessed (OT sat pt EOB while PT discuss d/c plans with daughter) Supine to Sit: 6: Modified independent (Device/Increase time) Sitting - Scoot to Edge of Bed: 6: Modified independent (Device/Increase time) Transfers Sit to Stand: 4: Min guard Stand to Sit: 4: Min guard Details for Transfer Assistance: Cues for RW placement, safety, and proper technique       Exercises      Balance     End of Session OT - End of Session Activity Tolerance: Patient tolerated treatment well Patient left: in chair;with call bell/phone within reach Nurse Communication: Mobility status;Precautions  GO     Lucile Shutters 04/03/2012, 3:42 PM Pager: [phone]

## 2012-04-03 NOTE — Progress Notes (Signed)
  Georgena Spurling, MD   [name], PA-C [address] [place], [place], [place]  [address]                             [phone]   PROGRESS NOTE  Subjective:  negative for Chest Pain  negative for Shortness of Breath  positive for Nausea/Vomiting   negative for Calf Pain  negative for Bowel Movement   Tolerating Diet: yes         Patient reports pain as 4 on 0-10 scale.    Objective: Vital signs in last 24 hours:   Patient Vitals for the past 24 hrs:  BP Temp Pulse Resp SpO2  04/03/12 0412 158/65 mmHg 98.4 F (36.9 C) 94  18  95 %  04/02/12 2022 130/58 mmHg 99.3 F (37.4 C) 93  18  94 %    @flow {1959:LAST@   Intake/Output from previous day:       Intake/Output this shift:       Intake/Output      09/25 0701 - 09/26 0700 09/26 0701 - 09/27 0700   P.O.     I.V.     Blood     Total Intake     Urine     Total Output     Net          Urine Occurrence 3 x 1 x   Stool Occurrence  1 x      LABORATORY DATA:  Basename 04/03/12 0552 04/02/12 0611 04/01/12 0525 03/28/12 1533  WBC 7.9 9.3 9.0 10.5  HGB 9.9* 10.0* 7.3* 11.4*  HCT 29.5* 30.5* 23.0* 35.0*  PLT 248 221 242 350    Basename 04/03/12 0552 04/02/12 0611 04/01/12 0525 03/28/12 1533  NA 140 136 138 141  K 3.3* 3.7 3.3* 4.2  CL 106 104 109 106  CO2 24 23 21 26   BUN 7 11 11 13   CREATININE 0.61 0.75 0.69 0.70  GLUCOSE 98 97 76 92  CALCIUM 8.8 8.3* 7.3* 9.9   Lab Results  Component Value Date   INR 3.54* 04/03/2012   INR 3.02* 04/02/2012   INR 1.59* 04/01/2012    Examination:  General appearance: alert, cooperative and no distress Extremities: Homans sign is negative, no sign of DVT  Wound Exam: clean, dry, intact   Drainage:  Scant/small amount Serosanguinous exudate  Motor Exam: EHL and FHL Intact  Sensory Exam: Deep Peroneal normal  Vascular Exam:    Assessment:    3 Days Post-Op  Procedure(s) (LRB): IRRIGATION AND DEBRIDEMENT HIP WITH POLY EXCHANGE (Left)  ADDITIONAL DIAGNOSIS:    Active Problems:  * No active hospital problems. *   Acute Blood Loss Anemia   Plan: Physical Therapy as ordered Weight Bearing as Tolerated (WBAT)  DVT Prophylaxis:  Lovenox and Coumadin  DISCHARGE PLAN: Skilled Nursing Facility/Rehab vs home  DISCHARGE NEEDS: HHPT, HHRN, Walker and 3-in-1 comode seat         Keilyn Haggard 04/03/2012, 1:39 PM

## 2012-04-03 NOTE — Progress Notes (Signed)
OT Cancellation Note  Treatment cancelled today due to patient throwing up this AM and feeling "fatigued". pt reports poor rest during PM hours due to restless legs and requesting to sleep at this time. Pt 's curtain closed to decr light and room and when therapist returned to patient, she was already asleep. Ot to hold treatment at this time. Ot needs to see patient for LB and toilet transfer prior to d/c.Marland Kitchen  Harrel Carina Weems Pager: [phone]  04/03/2012, 10:42 AM

## 2012-04-03 NOTE — Progress Notes (Signed)
Agree with PT treatment.    Deneane Stifter, PT DPT [phone]  

## 2012-04-03 NOTE — Progress Notes (Signed)
ANTICOAGULATION CONSULT - COUMADIN  Pharmacy Consult for Coumadin  HPI: 78 y.o.female admitted for left hip infection s/p I&D L-hip/THR infection who is currently on Coumadin for VTE prophylaxis and h/o CVA.  Allergies: Allergies  Allergen Reactions  . Dilaudid (Hydromorphone Hcl) Shortness Of Breath  . Morphine And Related Shortness Of Breath  . Sulfa Antibiotics Other (See Comments)    "Made me drunk";  wobbly  . Keflex (Cephalexin) Nausea Only    Vitals: Blood pressure 158/65, pulse 94, temperature 98.4 F (36.9 C), temperature source Oral, resp. rate 18, SpO2 95.00%.   Medical / Surgical History: Past Medical History  Diagnosis Date  . GERD (gastroesophageal reflux disease)   . Glaucoma   . Cancer   . Hypertension   . Depression    Past Surgical History  Procedure Date  . Total knee arthroplasty   . Total hip arthroplasty   . Mastectomy 1996    left breast lumpectomy   . Incision and drainage hip 03/31/2012    Procedure: IRRIGATION AND DEBRIDEMENT HIP WITH POLY EXCHANGE;  Surgeon: Raymon Mutton, MD;  Location: [facility] OR;  Service: Orthopedics;  Laterality: Left;    Current Labs:   [facility] 04/03/12 0552 04/02/12 0611 04/01/12 0525  HGB 9.9* 10.0* --  HCT 29.5* 30.5* 23.0*  PLT 248 221 242  LABPROT 33.4* 29.7* 18.5*  INR 3.54* 3.02* 1.59*  CREATININE 0.61 0.75 0.69    Lab Results  Component Value Date   INR 3.54* 04/03/2012   INR 3.02* 04/02/2012   INR 1.59* 04/01/2012   The CrCl is unknown because both a height and weight (above a minimum accepted value) are required for this calculation.  Pertinent Medication History: Medication Sig  . amLODipine (NORVASC) 5 MG tablet Take 5 mg by mouth daily.  Marland Kitchen atorvastatin (LIPITOR) 10 MG tablet Take 10 mg by mouth daily.    Marland Kitchen buPROPion (WELLBUTRIN XL) 300 MG 24 hr tablet Take 300 mg by mouth daily.  . calcium carbonate 200 MG capsule Take 600 mg by mouth 2 (two) times daily with a meal.  . enoxaparin (LOVENOX)  80 MG/0.8ML injection Inject 65 mg into the skin every 12 (twelve) hours.  Marland Kitchen latanoprost (XALATAN) 0.005 % ophthalmic solution Place 1 drop into both eyes at bedtime.  Marland Kitchen rOPINIRole (REQUIP) 1 MG tablet Take 1 mg by mouth at bedtime as needed. For restless legs  . sertraline (ZOLOFT) 100 MG tablet Take 150 mg by mouth daily.  Marland Kitchen warfarin (COUMADIN) 2 MG tablet Take 3 mg by mouth daily. Pt takes 1.5 tab daily   Scheduled:    . amLODipine  5 mg Oral Daily  . atorvastatin  10 mg Oral q1800  . buPROPion  300 mg Oral Daily  . calcium carbonate  1 tablet Oral BID WC  . cefTRIAXone (ROCEPHIN)  IV  1 g Intravenous Q24H  . docusate sodium  100 mg Oral BID  . latanoprost  1 drop Both Eyes QHS  . rifampin  300 mg Oral Daily  . sertraline  150 mg Oral Daily  . Warfarin - Pharmacist Dosing Inpatient   Does not apply q1800   Anti-infectives     Start     Dose/Rate Route Frequency Ordered Stop   04/01/12 1500   cefTRIAXone (ROCEPHIN) 1 g in dextrose 5 % 50 mL IVPB        1 g 100 mL/hr over 30 Minutes Intravenous Every 24 hours 04/01/12 1448     04/01/12 1500  rifampin (RIFADIN) capsule 300 mg        300 mg Oral Daily 04/01/12 1448     04/01/12 0000   vancomycin (VANCOCIN) IVPB 1000 mg/200 mL premix        1,000 mg 200 mL/hr over 60 Minutes Intravenous Every 12 hours 03/31/12 2008 04/01/12 0014          Assessment:  The INR is continuing to climb despite holding Coumadin yesterday.  INR up to 3.54 < 3.02.  Day # 3 of Ceftriaxone and Rifampin.  Goals:  Target INR of 2-3.  Plan:  Will continue to hold Coumadin for now.    Daily INR's, CBC.  Renee Adams, Elisha Headland, Pharm. D. 04/03/2012, 11:07 AM

## 2012-04-03 NOTE — Progress Notes (Signed)
Physical Therapy Progress Note   04/03/12 1500  PT Visit Information  Last PT Received On 04/03/12  Assistance Needed +1  PT/OT Co-Evaluation/Treatment Yes  PT Time Calculation  PT Start Time 0209  PT Stop Time 0238  PT Time Calculation (min) 29 min  Subjective Data  Subjective I am sorry for what I said earlier.  Patient Stated Goal To get better  Precautions  Precautions Anterior Hip  Restrictions  Weight Bearing Restrictions Yes  LLE Weight Bearing WBAT  Cognition  Overall Cognitive Status Appears within functional limits for tasks assessed/performed  Arousal/Alertness Awake/alert  Orientation Level Appears intact for tasks assessed  Behavior During Session [facility] for tasks performed  Bed Mobility  Bed Mobility Not assessed (OT sat pt EOB while PT discuss d/c plans with daughter)  Transfers  Transfers Sit to Stand;Stand to Sit  Sit to Stand 4: Min guard;From bed  Stand to Sit 4: Min guard;To chair/3-in-1  Details for Transfer Assistance Cues for RW placement, safety, and proper technique  Ambulation/Gait  Ambulation/Gait Assistance 4: Min guard  Ambulation Distance (Feet) 30 Feet  Assistive device Rolling walker  Ambulation/Gait Assistance Details Cues for proper technique, posture, and balance  Gait Pattern Step-to pattern;Decreased stride length;Antalgic  Gait velocity decreased  Stairs Yes  Stairs Assistance 4: Min guard  Stair Management Technique No rails;Forwards;Step to pattern;With walker  Number of Stairs 1   Wheelchair Mobility  Wheelchair Mobility No  PT - End of Session  Equipment Utilized During Treatment Gait belt  Activity Tolerance Patient tolerated treatment well  Patient left in chair;with call bell/phone within reach;with family/visitor present  Nurse Communication Mobility status  PT - Assessment/Plan  Comments on Treatment Session Pt still feeling fatigued but was able to tolerate therapy better this afternoon.  Daughter states that if she does  not continue to exercise and make progress they will want to place her in a rehab facility.  Plan for next session is to increase distance of ambulation and exercises.  PT Plan Discharge plan remains appropriate;Frequency remains appropriate  PT Frequency 7X/week  Recommendations for Other Services Other (comment) (None)  Follow Up Recommendations Home health PT  Equipment Recommended None recommended by PT  Acute Rehab PT Goals  PT Goal Formulation With patient  Time For Goal Achievement 04/08/12  Potential to Achieve Goals Good  Pt will go Sit to Supine/Side with modified independence;with HOB 0 degrees  PT Goal: Sit to Supine/Side - Progress Progressing toward goal  Pt will go Sit to Stand with supervision;with upper extremity assist  PT Goal: Sit to Stand - Progress Progressing toward goal  Pt will go Stand to Sit with supervision;with upper extremity assist  PT Goal: Stand to Sit - Progress Progressing toward goal  Pt will Ambulate 51 - 150 feet;with supervision;with least restrictive assistive device  PT Goal: Ambulate - Progress Progressing toward goal  Pt will Go Up / Down Stairs 1-2 stairs  PT Goal: Up/Down Stairs - Progress Met  PT General Charges  $$ ACUTE PT VISIT 1 Procedure  PT Treatments  $Gait Training 8-22 mins  $Therapeutic Activity 8-22 mins    Pt in L LE but did not rate.  Saliah Crisp, SPT

## 2012-04-03 NOTE — Progress Notes (Signed)
PT Cancellation Note  Treatment cancelled today due to patient's refusal to participate.  Attempted to see pt x 2.  Pt too fatigued from lack of sleep and vomiting. Will attempt to see this PM.  Pt will need to complete stair negotiation prior to d/c home.    Matther Labell 04/03/2012, 11:58 AM Jake Shark, PT DPT [phone]

## 2012-04-04 LAB — BASIC METABOLIC PANEL
BUN: 6 mg/dL (ref 6–23)
Creatinine, Ser: 0.55 mg/dL (ref 0.50–1.10)
GFR calc non Af Amer: 89 mL/min — ABNORMAL LOW (ref >90)
Glucose, Bld: 130 mg/dL — ABNORMAL HIGH (ref 70–99)
Potassium: 3.4 mEq/L — ABNORMAL LOW (ref 3.5–5.1)

## 2012-04-04 MED ORDER — RIFAMPIN 300 MG PO CAPS: 300.0000 mg | ORAL_CAPSULE | Freq: Every day | ORAL | Status: DC

## 2012-04-04 MED ORDER — VANCOMYCIN HCL IN DEXTROSE 1-5 GM/200ML-% IV SOLN: 1000.0000 mg | Freq: Two times a day (BID) | INTRAVENOUS | Status: DC

## 2012-04-04 MED ORDER — OXYCODONE HCL 5 MG PO TABS: 5.0000 mg | ORAL_TABLET | ORAL | Status: DC | PRN

## 2012-04-04 MED ORDER — SODIUM CHLORIDE 0.9 % IV SOLN: 500.0000 mg | Freq: Two times a day (BID) | INTRAVENOUS | Status: DC

## 2012-04-04 MED ORDER — DEXTROSE 5 % IV SOLN: 1.0000 g | INTRAVENOUS | Status: DC

## 2012-04-04 MED ORDER — ALPRAZOLAM 0.25 MG PO TABS: 1.0000 mg | ORAL_TABLET | ORAL | Status: DC | PRN

## 2012-04-04 MED ORDER — METHOCARBAMOL 500 MG PO TABS: 500.0000 mg | ORAL_TABLET | Freq: Four times a day (QID) | ORAL | Status: DC | PRN

## 2012-04-04 MED ORDER — VANCOMYCIN HCL 500 MG IV SOLR: 500.0000 mg | Freq: Two times a day (BID) | INTRAVENOUS | Status: DC

## 2012-04-04 MED ORDER — VANCOMYCIN HCL 500 MG IV SOLR
500.0000 mg | Freq: Two times a day (BID) | INTRAVENOUS | Status: DC
  Administered 2012-04-04: 500 mg via INTRAVENOUS
  Filled 2012-04-04 (×2): qty 500

## 2012-04-04 NOTE — Progress Notes (Signed)
ANTICOAGULATION CONSULT - COUMADIN  Pharmacy Consult for Coumadin  HPI: 78 y.o.female admitted for left hip infection s/p I&D L-hip/THR infection who is currently on Coumadin for VTE prophylaxis and h/o CVA.  Allergies: Allergies  Allergen Reactions  . Dilaudid (Hydromorphone Hcl) Shortness Of Breath  . Morphine And Related Shortness Of Breath  . Sulfa Antibiotics Other (See Comments)    "Made me drunk";  wobbly  . Keflex (Cephalexin) Nausea Only    Height/Weight: Height: 5' 4.17" (163 cm) Weight: 146 lb 6.2 oz (66.4 kg) IBW/kg (Calculated) : 55.1   Vitals: Blood pressure 209/96, pulse 90, temperature 98.8 F (37.1 C), temperature source Oral, resp. rate 16, height 5' 4.17" (1.63 m), weight 146 lb 6.2 oz (66.4 kg), SpO2 92.00%.   Medical / Surgical History: Past Medical History  Diagnosis Date  . GERD (gastroesophageal reflux disease)   . Glaucoma   . Cancer   . Hypertension   . Depression    Past Surgical History  Procedure Date  . Total knee arthroplasty   . Total hip arthroplasty   . Mastectomy 1996    left breast lumpectomy   . Incision and drainage hip 03/31/2012    Procedure: IRRIGATION AND DEBRIDEMENT HIP WITH POLY EXCHANGE;  Surgeon: Raymon Mutton, MD;  Location: [facility] OR;  Service: Orthopedics;  Laterality: Left;    Current Labs:   Basename 04/04/12 0500 04/03/12 0552 04/02/12 0611  HGB -- 9.9* 10.0*  HCT -- 29.5* 30.5*  PLT -- 248 221  LABPROT 32.5* 33.4* 29.7*  INR 3.41* 3.54* 3.02*  CREATININE 0.55 0.61 0.75    Lab Results  Component Value Date   INR 3.41* 04/04/2012   INR 3.54* 04/03/2012   INR 3.02* 04/02/2012   Estimated Creatinine Clearance: 57.2 ml/min (by C-G formula based on Cr of 0.55).  Pertinent Medication History: Scheduled:    . amLODipine  5 mg Oral Daily  . atorvastatin  10 mg Oral q1800  . buPROPion  300 mg Oral Daily  . calcium carbonate  1 tablet Oral BID WC  . cefTRIAXone (ROCEPHIN)  IV  1 g Intravenous Q24H  .  docusate sodium  100 mg Oral BID  . latanoprost  1 drop Both Eyes QHS  . ondansetron  4 mg Oral Q4H   Or  . ondansetron (ZOFRAN) IV  4 mg Intravenous Q4H  . rifampin  300 mg Oral Daily  . rOPINIRole  1 mg Oral QHS,MR X 1  . sertraline  150 mg Oral Daily  . Warfarin - Pharmacist Dosing Inpatient   Does not apply q1800    Assessment:  INR continues to be SUPRAtherapeutic with no Coumadin administered since 04/01/12.  No complications noted.  Goals:  Target INR of 2-3.  Plan:  Will continue to hold Coumadin today.    Daily INR's, CBC.  Nagee Goates, Elisha Headland, Pharm. D. 04/04/2012, 11:50 AM

## 2012-04-04 NOTE — Progress Notes (Signed)
Agree with PT treatment note.  Peretz Thieme, PT DPT [phone]  

## 2012-04-04 NOTE — Progress Notes (Signed)
Physical Therapy Treatment Patient Details Name: Renee Adams MRN: [id number] DOB: 1934/06/01 Today's Date: 04/04/2012 Time: 4098-1191 PT Time Calculation (min): 25 min  PT Assessment / Plan / Recommendation Comments on Treatment Session  Pt states feeling worried about her BP. She expresses feeling good with the hip surgery but all the other stuff that has been right has made her feel sick.  Pt was able to increase ambulation distance and improve safety with transfers.      Follow Up Recommendations  Home health PT    Barriers to Discharge        Equipment Recommendations  None recommended by PT    Recommendations for Other Services Other (comment) (None)  Frequency 7X/week   Plan Discharge plan remains appropriate;Frequency remains appropriate    Precautions / Restrictions Precautions Precautions: Anterior Hip Restrictions Weight Bearing Restrictions: Yes LLE Weight Bearing: Weight bearing as tolerated   Pertinent Vitals/Pain Pain in L LE but did not rate.      Mobility  Bed Mobility Bed Mobility: Not assessed Transfers Transfers: Sit to Stand;Stand to Sit Sit to Stand: 4: Min guard;From bed;From toilet Stand to Sit: 4: Min guard;To chair/3-in-1;To toilet Details for Transfer Assistance: Cues for proper technique and hand placement Ambulation/Gait Ambulation/Gait Assistance: 4: Min guard Ambulation Distance (Feet): 120 Feet Assistive device: Rolling walker Ambulation/Gait Assistance Details: Cues for proper technique, safety, and precautions Gait Pattern: Step-to pattern;Decreased stride length;Antalgic Gait velocity: decreased Stairs: No Wheelchair Mobility Wheelchair Mobility: No    Exercises     PT Diagnosis:    PT Problem List:   PT Treatment Interventions:     PT Goals Acute Rehab PT Goals PT Goal Formulation: With patient Time For Goal Achievement: 04/08/12 Potential to Achieve Goals: Good Pt will go Sit to Stand: with supervision;with upper  extremity assist PT Goal: Sit to Stand - Progress: Progressing toward goal Pt will go Stand to Sit: with supervision;with upper extremity assist PT Goal: Stand to Sit - Progress: Progressing toward goal Pt will Ambulate: 51 - 150 feet;with supervision;with least restrictive assistive device PT Goal: Ambulate - Progress: Partly met  Visit Information  Last PT Received On: 04/04/12 Assistance Needed: +1    Subjective Data  Subjective: Wish I could say I felt better Patient Stated Goal: To get better   Cognition  Overall Cognitive Status: Appears within functional limits for tasks assessed/performed Arousal/Alertness: Awake/alert Orientation Level: Appears intact for tasks assessed Behavior During Session: [facility] for tasks performed    Balance     End of Session PT - End of Session Equipment Utilized During Treatment: Gait belt Activity Tolerance: Patient tolerated treatment well Patient left: in chair;with call bell/phone within reach;with family/visitor present Nurse Communication: Mobility status   GP     Marleen Moret 04/04/2012, 9:31 AM

## 2012-04-04 NOTE — Progress Notes (Signed)
Physical Therapy Progress Note   04/04/12 1400  PT Visit Information  Last PT Received On 04/04/12  Assistance Needed +1  PT Time Calculation  PT Start Time 1429  PT Stop Time 1439  PT Time Calculation (min) 10 min  Subjective Data  Subjective I feel better just got some medicine  Patient Stated Goal To go home today  Precautions  Precautions Anterior Hip  Restrictions  Weight Bearing Restrictions Yes  LLE Weight Bearing WBAT  Cognition  Overall Cognitive Status Appears within functional limits for tasks assessed/performed  Arousal/Alertness Awake/alert  Orientation Level Appears intact for tasks assessed  Behavior During Session [facility] for tasks performed  Bed Mobility  Bed Mobility Not assessed  Transfers  Transfers Not assessed  Ambulation/Gait  Ambulation/Gait Assistance Not tested (comment)  Stairs No  Wheelchair Mobility  Wheelchair Mobility No  Exercises  Exercises Total Joint  Total Joint Exercises  Ankle Circles/Pumps AROM;Both;10 reps  Quad Sets AROM;Both;5 reps  Heel Slides AROM;Left;5 reps  Short Arc Quad AROM;Left;5 reps  PT - End of Session  Activity Tolerance Patient tolerated treatment well  Patient left in bed;with call bell/phone within reach;with family/visitor present  Nurse Communication Mobility status  PT - Assessment/Plan  Comments on Treatment Session Pt willing to perform TKA HEP and is excited to be d/c home.  Pt and family educated on exercises and when to expect home health PT.  Pt ready for d/c when deemed medically safe.  Pt and caregiver had no further questions about mobility and pt refused mobility at times.  PT Plan Discharge plan remains appropriate;Frequency remains appropriate  PT Frequency 7X/week  Recommendations for Other Services Other (comment) (None)  Follow Up Recommendations Home health PT  Equipment Recommended None recommended by PT  Acute Rehab PT Goals  PT Goal Formulation With patient  Time For Goal Achievement  04/08/12  Potential to Achieve Goals Good  Pt will Perform Home Exercise Program Independently  PT Goal: Perform Home Exercise Program - Progress Progressing toward goal    Pt reports pain in L LE did not rate.  Amy DiTommaso, SPT [place],  DPT [phone]

## 2012-04-04 NOTE — Progress Notes (Signed)
Patient now with rash on Rocephin and Rifampin.  Developed diffuse rash.  Will d/c both and start vancomycin IV per home health protocol, trough goal 15-20.  Duration remains the same.  Also weekly CBC and CMP.

## 2012-04-04 NOTE — Progress Notes (Signed)
  Georgena Spurling, MD   [name], PA-C [address] [place], [place], [place]  [address]                             [phone]   PROGRESS NOTE  Subjective:  negative for Chest Pain  negative for Shortness of Breath  negative for Nausea/Vomiting   negative for Calf Pain  negative for Bowel Movement   Tolerating Diet: yes         Patient reports pain as 4 on 0-10 scale.    Objective: Vital signs in last 24 hours:   Patient Vitals for the past 24 hrs:  BP Temp Pulse Resp SpO2 Height Weight  04/04/12 1149 - - - - - 5' 4.17" (1.63 m) 66.4 kg (146 lb 6.2 oz)  04/04/12 1030 209/96 mmHg 98.8 F (37.1 C) 90  16  92 % - -  04/04/12 0627 138/89 mmHg 98.9 F (37.2 C) 90  16  90 % - -  04/03/12 2131 178/71 mmHg 98.5 F (36.9 C) 88  16  94 % - -  04/03/12 1400 153/76 mmHg 98.3 F (36.8 C) 93  16  96 % - -    @flow {1959:LAST@   Intake/Output from previous day:   09/26 0701 - 09/27 0700 In: 390 [I.V.:240] Out: -    Intake/Output this shift:       Intake/Output      09/26 0701 - 09/27 0700 09/27 0701 - 09/28 0700   I.V. (mL/kg) 240    IV Piggyback 150    Total Intake(mL/kg) 390    Net +390         Urine Occurrence 2 x    Stool Occurrence 1 x       LABORATORY DATA:  Basename 04/03/12 0552 04/02/12 0611 04/01/12 0525 03/28/12 1533  WBC 7.9 9.3 9.0 10.5  HGB 9.9* 10.0* 7.3* 11.4*  HCT 29.5* 30.5* 23.0* 35.0*  PLT 248 221 242 350    Basename 04/04/12 0500 04/03/12 0552 04/02/12 0611 04/01/12 0525 03/28/12 1533  NA 135 140 136 138 141  K 3.4* 3.3* 3.7 3.3* 4.2  CL 100 106 104 109 106  CO2 23 24 23 21 26   BUN 6 7 11 11 13   CREATININE 0.55 0.61 0.75 0.69 0.70  GLUCOSE 130* 98 97 76 92  CALCIUM 9.2 8.8 8.3* 7.3* 9.9   Lab Results  Component Value Date   INR 3.41* 04/04/2012   INR 3.54* 04/03/2012   INR 3.02* 04/02/2012    Examination:  General appearance: alert, cooperative and no distress Extremities: Homans sign is negative, no sign of DVT  Wound Exam:  clean, dry, intact   Drainage:  None: wound tissue dry  Motor Exam: EHL and FHL Intact  Sensory Exam: Deep Peroneal normal  Vascular Exam:    Assessment:    4 Days Post-Op  Procedure(s) (LRB): IRRIGATION AND DEBRIDEMENT HIP WITH POLY EXCHANGE (Left)  ADDITIONAL DIAGNOSIS:  Active Problems:  * No active hospital problems. *   Acute Blood Loss Anemia   Plan: Physical Therapy as ordered Weight Bearing as Tolerated (WBAT)  DVT Prophylaxis:  Coumadin  DISCHARGE PLAN: Skilled Nursing Facility/Rehab  DISCHARGE NEEDS: HHPT, HHRN, Walker and 3-in-1 comode seat         Jani Moronta 04/04/2012, 1:10 PM

## 2012-04-04 NOTE — Discharge Summary (Signed)
Renee Spurling, MD   [name], PA-C [address] [place], [place], [place]  [address]                             [phone]  PATIENT ID: Renee Adams        MRN:  [id number]          DOB/AGE: Feb 01, 1934 / 78 y.o.    DISCHARGE SUMMARY  ADMISSION DATE:    03/31/2012 DISCHARGE DATE:   04/04/2012   ADMISSION DIAGNOSIS: left hip infection    DISCHARGE DIAGNOSIS:  left hip infection    ADDITIONAL DIAGNOSIS: Active Problems:  * No active hospital problems. *   Past Medical History  Diagnosis Date  . GERD (gastroesophageal reflux disease)   . Glaucoma   . Cancer   . Hypertension   . Depression     PROCEDURE: Procedure(s): IRRIGATION AND DEBRIDEMENT HIP WITH POLY EXCHANGE on 03/31/2012  CONSULTS: Treatment Team:  Kathi Ludwig, MD   HISTORY:  See H&P in chart  HOSPITAL COURSE:  Renee Adams is a 78 y.o. admitted on 03/31/2012 and found to have a diagnosis of left hip infection.  After appropriate laboratory studies were obtained  they were taken to the operating room on 03/31/2012 and underwent Procedure(s): IRRIGATION AND DEBRIDEMENT HIP WITH POLY EXCHANGE.   They were given perioperative antibiotics:  Anti-infectives     Start     Dose/Rate Route Frequency Ordered Stop   04/04/12 0000   dextrose 5 % SOLN 50 mL with cefTRIAXone 1 G SOLR 1 g        1 g 100 mL/hr over 30 Minutes Intravenous Every 24 hours 04/04/12 1322     04/04/12 0000   rifampin (RIFADIN) 300 MG capsule        300 mg Oral Daily 04/04/12 1322     04/01/12 1500   cefTRIAXone (ROCEPHIN) 1 g in dextrose 5 % 50 mL IVPB        1 g 100 mL/hr over 30 Minutes Intravenous Every 24 hours 04/01/12 1448     04/01/12 1500   rifampin (RIFADIN) capsule 300 mg        300 mg Oral Daily 04/01/12 1448     04/01/12 0000   vancomycin (VANCOCIN) IVPB 1000 mg/200 mL premix        1,000 mg 200 mL/hr over 60 Minutes Intravenous Every 12 hours 03/31/12 2008 04/01/12 0014        .  Tolerated the  procedure well.  Placed with a foley intraoperatively.  Given Ofirmev at induction and for 48 hours.    POD #1, allowed out of bed to a chair.  PT for ambulation and exercise program.  Foley D/C'd in morning.  IV saline locked.  O2 discontionued.  POD #2, continued PT and ambulation.   Hemovac pulled. . ID CONSULT given picc line and iv antibiotics  The remainder of the hospital course was dedicated to ambulation and strengthening.   The patient was discharged on 4 Days Post-Op in  Good condition.  Blood products given:2 units CC PRBC  DIAGNOSTIC STUDIES: Recent vital signs: Patient Vitals for the past 24 hrs:  BP Temp Pulse Resp SpO2 Height Weight  04/04/12 1149 - - - - - 5' 4.17" (1.63 m) 66.4 kg (146 lb 6.2 oz)  04/04/12 1030 209/96 mmHg 98.8 F (37.1 C) 90  16  92 % - -  04/04/12 2130  138/89 mmHg 98.9 F (37.2 C) 90  16  90 % - -  04/03/12 2131 178/71 mmHg 98.5 F (36.9 C) 88  16  94 % - -  04/03/12 1400 153/76 mmHg 98.3 F (36.8 C) 93  16  96 % - -       Recent laboratory studies:  Basename 04/03/12 0552 2012-04-06 0611 04/01/12 0525 03/28/12 1533  WBC 7.9 9.3 9.0 10.5  HGB 9.9* 10.0* 7.3* 11.4*  HCT 29.5* 30.5* 23.0* 35.0*  PLT 248 221 242 350    Basename 04/04/12 0500 04/03/12 0552 04-06-2012 0611 04/01/12 0525 03/28/12 1533  NA 135 140 136 138 141  K 3.4* 3.3* 3.7 3.3* 4.2  CL 100 106 104 109 106  CO2 23 24 23 21 26   BUN 6 7 11 11 13   CREATININE 0.55 0.61 0.75 0.69 0.70  GLUCOSE 130* 98 97 76 92  CALCIUM 9.2 8.8 8.3* 7.3* 9.9   Lab Results  Component Value Date   INR 3.41* 04/04/2012   INR 3.54* 04/03/2012   INR 3.02* 04-06-12     Recent Radiographic Studies :  Dg Chest 2 View  03/28/2012  *RADIOLOGY REPORT*  Clinical Data: Preop.  History of stroke.  History of breast cancer, hypertension.  CHEST - 2 VIEW  Comparison: 02/08/2011  Findings: The heart is upper limits normal in size.  Aorta is mildly tortuous.  There are no focal consolidations or pleural  effusions.  Mild bibasilar atelectasis.  There is S-shaped scoliosis of the thoracic spine.  Surgical clips are identified in the upper abdomen.  Surgical clips are identified in the left axilla.  IMPRESSION: No evidence for acute cardiopulmonary abnormality.   Original Report Authenticated By: Patterson Hammersmith, M.D.    Dg Fluoro Guide Ndl Plc/bx  03/24/2012  *RADIOLOGY REPORT*  CLINICAL DATA:  Left hip pain.  Possible infection  Left hip aspiration:  Following informed, written consent, the patient was positioned supine and the left hip was located under fluoroscopy.  The skin was prepped and draped in the usual sterile fashion.  The superficial soft tissues were anesthetized with 1% lidocaine.  A 18 gauge needle was advanced into the joint.  Placement was confirmed with 1.0 ml of Omnipaque 180.  Then aspirated 1 ml of sanguinous fluid.  I injected 5 ml of sterile saline and aspirated a second 1 ml of sanguinous fluid. Both samples were sent for Gram stain and cultures as requested.  FLUORO TIME:  1.53 minutes  IMPRESSIONS:  Technically successful left hip aspiration under fluoroscopic guidance.   Original Report Authenticated By: Jamesetta Orleans. MATTERN, M.D.     DISCHARGE INSTRUCTIONS: Discharge Orders    Future Appointments: Provider: Department: Dept Phone: Center:   04/18/2012 11:15 AM Ginnie Smart, MD [facility] [phone] [facility]   09/08/2012 1:30 PM Krista Blue [facility] [phone] None   09/15/2012 2:00 PM Lowella Dell, MD [facility] [phone] None     Future Orders Please Complete By Expires   Diet - low sodium heart healthy      Call MD / Call 911      Comments:   If you experience chest pain or shortness of breath, CALL 911 and be transported to the hospital emergency room.  If you develope a fever above 101 F, pus (white drainage) or increased drainage or redness at the wound, or calf pain, call your surgeon's office.   Constipation Prevention       Comments:  Drink plenty of fluids.  Prune juice may be helpful.  You may use a stool softener, such as Colace (over the counter) 100 mg twice a day.  Use MiraLax (over the counter) for constipation as needed.   Increase activity slowly as tolerated      Driving restrictions      Comments:   No driving for 6 weeks   Lifting restrictions      Comments:   No lifting for 6 weeks   Follow the hip precautions as taught in Physical Therapy      Change dressing      Comments:   You may change your dressing on saturday, then change the dressing daily with sterile 4 x 4 inch gauze dressing and paper tape.      DISCHARGE MEDICATIONS:     Medication List     As of 04/04/2012  1:25 PM    STOP taking these medications         enoxaparin 80 MG/0.8ML injection   Commonly known as: LOVENOX      TAKE these medications         ALPRAZolam 0.25 MG tablet   Commonly known as: XANAX   Take 4 tablets (1 mg total) by mouth every 4 (four) hours as needed for anxiety.      amLODipine 5 MG tablet   Commonly known as: NORVASC   Take 5 mg by mouth daily.      atorvastatin 10 MG tablet   Commonly known as: LIPITOR   Take 10 mg by mouth daily.      buPROPion 300 MG 24 hr tablet   Commonly known as: WELLBUTRIN XL   Take 300 mg by mouth daily.      calcium carbonate 200 MG capsule   Take 600 mg by mouth 2 (two) times daily with a meal.      dextrose 5 % SOLN 50 mL with cefTRIAXone 1 G SOLR 1 g   Inject 1 g into the vein daily.      latanoprost 0.005 % ophthalmic solution   Commonly known as: XALATAN   Place 1 drop into both eyes at bedtime.      methocarbamol 500 MG tablet   Commonly known as: ROBAXIN   Take 1-2 tablets (500-1,000 mg total) by mouth every 6 (six) hours as needed.      oxyCODONE 5 MG immediate release tablet   Commonly known as: Oxy IR/ROXICODONE   Take 1-2 tablets (5-10 mg total) by mouth every 4 (four) hours as needed.      rifampin 300 MG capsule   Commonly  known as: RIFADIN   Take 1 capsule (300 mg total) by mouth daily.      rOPINIRole 1 MG tablet   Commonly known as: REQUIP   Take 1 mg by mouth at bedtime as needed. For restless legs      sertraline 100 MG tablet   Commonly known as: ZOLOFT   Take 150 mg by mouth daily.      warfarin 2 MG tablet   Commonly known as: COUMADIN   Take 3 mg by mouth daily. Pt takes 1.5 tab daily        FOLLOW UP VISIT:       Follow-up Information    Follow up with Raymon Mutton, MD. Call on 04/15/2012.   Contact information:   [address] [place] [address] [phone]          DISPOSITION:  home  CONDITION:  {Good  Winifred Bodiford 04/04/2012, 1:25 PM

## 2012-04-05 DIAGNOSIS — R109 Unspecified abdominal pain: Secondary | ICD-10-CM | POA: Diagnosis not present

## 2012-04-05 DIAGNOSIS — R262 Difficulty in walking, not elsewhere classified: Secondary | ICD-10-CM | POA: Diagnosis not present

## 2012-04-05 DIAGNOSIS — F329 Major depressive disorder, single episode, unspecified: Secondary | ICD-10-CM | POA: Diagnosis not present

## 2012-04-05 DIAGNOSIS — H543 Unqualified visual loss, both eyes: Secondary | ICD-10-CM | POA: Diagnosis not present

## 2012-04-05 DIAGNOSIS — M169 Osteoarthritis of hip, unspecified: Secondary | ICD-10-CM | POA: Diagnosis not present

## 2012-04-05 LAB — ANAEROBIC CULTURE

## 2012-04-07 DIAGNOSIS — R109 Unspecified abdominal pain: Secondary | ICD-10-CM | POA: Diagnosis not present

## 2012-04-07 DIAGNOSIS — F329 Major depressive disorder, single episode, unspecified: Secondary | ICD-10-CM | POA: Diagnosis not present

## 2012-04-07 DIAGNOSIS — M169 Osteoarthritis of hip, unspecified: Secondary | ICD-10-CM | POA: Diagnosis not present

## 2012-04-07 DIAGNOSIS — R262 Difficulty in walking, not elsewhere classified: Secondary | ICD-10-CM | POA: Diagnosis not present

## 2012-04-07 DIAGNOSIS — H543 Unqualified visual loss, both eyes: Secondary | ICD-10-CM | POA: Diagnosis not present

## 2012-04-08 DIAGNOSIS — R262 Difficulty in walking, not elsewhere classified: Secondary | ICD-10-CM | POA: Diagnosis not present

## 2012-04-08 DIAGNOSIS — R109 Unspecified abdominal pain: Secondary | ICD-10-CM | POA: Diagnosis not present

## 2012-04-08 DIAGNOSIS — M169 Osteoarthritis of hip, unspecified: Secondary | ICD-10-CM | POA: Diagnosis not present

## 2012-04-08 DIAGNOSIS — Z792 Long term (current) use of antibiotics: Secondary | ICD-10-CM | POA: Diagnosis not present

## 2012-04-08 DIAGNOSIS — F329 Major depressive disorder, single episode, unspecified: Secondary | ICD-10-CM | POA: Diagnosis not present

## 2012-04-08 DIAGNOSIS — H543 Unqualified visual loss, both eyes: Secondary | ICD-10-CM | POA: Diagnosis not present

## 2012-04-09 DIAGNOSIS — R109 Unspecified abdominal pain: Secondary | ICD-10-CM | POA: Diagnosis not present

## 2012-04-09 DIAGNOSIS — H543 Unqualified visual loss, both eyes: Secondary | ICD-10-CM | POA: Diagnosis not present

## 2012-04-09 DIAGNOSIS — F329 Major depressive disorder, single episode, unspecified: Secondary | ICD-10-CM | POA: Diagnosis not present

## 2012-04-09 DIAGNOSIS — M169 Osteoarthritis of hip, unspecified: Secondary | ICD-10-CM | POA: Diagnosis not present

## 2012-04-09 DIAGNOSIS — R262 Difficulty in walking, not elsewhere classified: Secondary | ICD-10-CM | POA: Diagnosis not present

## 2012-04-10 DIAGNOSIS — H543 Unqualified visual loss, both eyes: Secondary | ICD-10-CM | POA: Diagnosis not present

## 2012-04-10 DIAGNOSIS — M169 Osteoarthritis of hip, unspecified: Secondary | ICD-10-CM | POA: Diagnosis not present

## 2012-04-10 DIAGNOSIS — R109 Unspecified abdominal pain: Secondary | ICD-10-CM | POA: Diagnosis not present

## 2012-04-10 DIAGNOSIS — R262 Difficulty in walking, not elsewhere classified: Secondary | ICD-10-CM | POA: Diagnosis not present

## 2012-04-10 DIAGNOSIS — F329 Major depressive disorder, single episode, unspecified: Secondary | ICD-10-CM | POA: Diagnosis not present

## 2012-04-11 DIAGNOSIS — R109 Unspecified abdominal pain: Secondary | ICD-10-CM | POA: Diagnosis not present

## 2012-04-11 DIAGNOSIS — H543 Unqualified visual loss, both eyes: Secondary | ICD-10-CM | POA: Diagnosis not present

## 2012-04-11 DIAGNOSIS — R262 Difficulty in walking, not elsewhere classified: Secondary | ICD-10-CM | POA: Diagnosis not present

## 2012-04-11 DIAGNOSIS — M169 Osteoarthritis of hip, unspecified: Secondary | ICD-10-CM | POA: Diagnosis not present

## 2012-04-11 DIAGNOSIS — F329 Major depressive disorder, single episode, unspecified: Secondary | ICD-10-CM | POA: Diagnosis not present

## 2012-04-12 DIAGNOSIS — R262 Difficulty in walking, not elsewhere classified: Secondary | ICD-10-CM | POA: Diagnosis not present

## 2012-04-12 DIAGNOSIS — R109 Unspecified abdominal pain: Secondary | ICD-10-CM | POA: Diagnosis not present

## 2012-04-12 DIAGNOSIS — H543 Unqualified visual loss, both eyes: Secondary | ICD-10-CM | POA: Diagnosis not present

## 2012-04-12 DIAGNOSIS — F329 Major depressive disorder, single episode, unspecified: Secondary | ICD-10-CM | POA: Diagnosis not present

## 2012-04-12 DIAGNOSIS — M169 Osteoarthritis of hip, unspecified: Secondary | ICD-10-CM | POA: Diagnosis not present

## 2012-04-14 DIAGNOSIS — R262 Difficulty in walking, not elsewhere classified: Secondary | ICD-10-CM | POA: Diagnosis not present

## 2012-04-14 DIAGNOSIS — H543 Unqualified visual loss, both eyes: Secondary | ICD-10-CM | POA: Diagnosis not present

## 2012-04-14 DIAGNOSIS — F329 Major depressive disorder, single episode, unspecified: Secondary | ICD-10-CM | POA: Diagnosis not present

## 2012-04-14 DIAGNOSIS — M169 Osteoarthritis of hip, unspecified: Secondary | ICD-10-CM | POA: Diagnosis not present

## 2012-04-14 DIAGNOSIS — Z792 Long term (current) use of antibiotics: Secondary | ICD-10-CM | POA: Diagnosis not present

## 2012-04-14 DIAGNOSIS — R109 Unspecified abdominal pain: Secondary | ICD-10-CM | POA: Diagnosis not present

## 2012-04-15 DIAGNOSIS — F329 Major depressive disorder, single episode, unspecified: Secondary | ICD-10-CM | POA: Diagnosis not present

## 2012-04-15 DIAGNOSIS — R262 Difficulty in walking, not elsewhere classified: Secondary | ICD-10-CM | POA: Diagnosis not present

## 2012-04-15 DIAGNOSIS — R109 Unspecified abdominal pain: Secondary | ICD-10-CM | POA: Diagnosis not present

## 2012-04-15 DIAGNOSIS — M169 Osteoarthritis of hip, unspecified: Secondary | ICD-10-CM | POA: Diagnosis not present

## 2012-04-15 DIAGNOSIS — M25559 Pain in unspecified hip: Secondary | ICD-10-CM | POA: Diagnosis not present

## 2012-04-15 DIAGNOSIS — H543 Unqualified visual loss, both eyes: Secondary | ICD-10-CM | POA: Diagnosis not present

## 2012-04-16 DIAGNOSIS — H543 Unqualified visual loss, both eyes: Secondary | ICD-10-CM | POA: Diagnosis not present

## 2012-04-16 DIAGNOSIS — M169 Osteoarthritis of hip, unspecified: Secondary | ICD-10-CM | POA: Diagnosis not present

## 2012-04-16 DIAGNOSIS — R262 Difficulty in walking, not elsewhere classified: Secondary | ICD-10-CM | POA: Diagnosis not present

## 2012-04-16 DIAGNOSIS — F329 Major depressive disorder, single episode, unspecified: Secondary | ICD-10-CM | POA: Diagnosis not present

## 2012-04-16 DIAGNOSIS — R109 Unspecified abdominal pain: Secondary | ICD-10-CM | POA: Diagnosis not present

## 2012-04-17 DIAGNOSIS — F329 Major depressive disorder, single episode, unspecified: Secondary | ICD-10-CM | POA: Diagnosis not present

## 2012-04-17 DIAGNOSIS — M169 Osteoarthritis of hip, unspecified: Secondary | ICD-10-CM | POA: Diagnosis not present

## 2012-04-17 DIAGNOSIS — R109 Unspecified abdominal pain: Secondary | ICD-10-CM | POA: Diagnosis not present

## 2012-04-17 DIAGNOSIS — H543 Unqualified visual loss, both eyes: Secondary | ICD-10-CM | POA: Diagnosis not present

## 2012-04-17 DIAGNOSIS — R262 Difficulty in walking, not elsewhere classified: Secondary | ICD-10-CM | POA: Diagnosis not present

## 2012-04-18 ENCOUNTER — Ambulatory Visit (INDEPENDENT_AMBULATORY_CARE_PROVIDER_SITE_OTHER): Payer: Medicare Other | Admitting: Infectious Diseases

## 2012-04-18 ENCOUNTER — Encounter: Payer: Self-pay | Admitting: Infectious Diseases

## 2012-04-18 VITALS — BP 175/85 | HR 88 | Temp 98.4°F | Ht 64.0 in | Wt 138.0 lb

## 2012-04-18 DIAGNOSIS — H543 Unqualified visual loss, both eyes: Secondary | ICD-10-CM | POA: Diagnosis not present

## 2012-04-18 DIAGNOSIS — Z96649 Presence of unspecified artificial hip joint: Secondary | ICD-10-CM

## 2012-04-18 DIAGNOSIS — Z96642 Presence of left artificial hip joint: Secondary | ICD-10-CM | POA: Insufficient documentation

## 2012-04-18 DIAGNOSIS — M869 Osteomyelitis, unspecified: Secondary | ICD-10-CM | POA: Diagnosis not present

## 2012-04-18 DIAGNOSIS — M169 Osteoarthritis of hip, unspecified: Secondary | ICD-10-CM | POA: Diagnosis not present

## 2012-04-18 DIAGNOSIS — F329 Major depressive disorder, single episode, unspecified: Secondary | ICD-10-CM | POA: Diagnosis not present

## 2012-04-18 DIAGNOSIS — R109 Unspecified abdominal pain: Secondary | ICD-10-CM | POA: Diagnosis not present

## 2012-04-18 DIAGNOSIS — R262 Difficulty in walking, not elsewhere classified: Secondary | ICD-10-CM | POA: Diagnosis not present

## 2012-04-18 MED ORDER — LEVOFLOXACIN 500 MG PO TABS: 500.0000 mg | ORAL_TABLET | Freq: Every day | ORAL | Status: DC

## 2012-04-18 NOTE — Assessment & Plan Note (Addendum)
She is doing well. Will defer on starting her back on rifampin due to ? Of rash. Will defer the LLE edema eval. This is most likely related to her surgery, she and her family relate it to the anbx. DVT is possible as well (she is on coumadin). She gets flu shot today.  Will stop her vanco, pull her PIC on 05-12-12 (42 days post surgery). Will recheck her ESR and CRP then as well ( a script is written for her home health agency for these). Will change her to PO anbx with anticipation that she will be on these for 6-12 months (given prolonged d/c from wound, presence of hardware). I cautioned her that her levaquin may alter her INR. Also, that she needs to take the levaquin 2 hours after eating/taking her Calcium Will see her back in 6 weeks.

## 2012-04-18 NOTE — Progress Notes (Signed)
  Subjective:    Patient ID: Renee Adams, female    DOB: 12-01-1933, 78 y.o.   MRN: [id number]  HPI 78 yo F with hx of L and R THR 1997. She had "re-do" of her L hip 2007.  She returned 9-23 after developing pain, swelling and erythema of her L hip since August 2nd. She also had pain and swelling in her L foot. She also had UTI during this period, she was treated with cipro for 7 days, then 10 days of doxy (completed 9-4).  She developed f/c,  was seen in the outpt clinic and had aspirate of her hip that grew GBS (I-erythro, otherwise sensitive) (03-24-12). She was taken to OR on 9-24 and underwent exchange of parts, wash out. Her  Cx grew GBS (S agalictiae). She was started on ceftriaxone and rifampin and d/c home on 9-27 on vanco (presumed secondary to ceftriaxone vs rifampin).  Today is day 18/42. Stop date November 4.  Has been feeling well, no pain for the last few days. Started having more drainage from her wound, VAC placed on her wound. Has had some swelling in her feet. Her anbx/vanco now go in over 2h instead of 1h and she has had less swelling in her feet since then.  No problems with PIC line, no d/c, no erythema, no tenderness.  Has been doing well with PT, was started on cane yesterday.     Review of Systems  Constitutional: Negative for appetite change and unexpected weight change.  Gastrointestinal: Negative for diarrhea and constipation.  Genitourinary: Negative for vaginal discharge and difficulty urinating.       Objective:   Physical Exam  Constitutional: She appears well-developed and well-nourished.  HENT:  Mouth/Throat: No oropharyngeal exudate.  Eyes: EOM are normal. Pupils are equal, round, and reactive to light.  Neck: Neck supple.  Cardiovascular: Normal rate, regular rhythm and normal heart sounds.   Pulmonary/Chest: Effort normal and breath sounds normal.  Abdominal: Soft. Bowel sounds are normal. There is no tenderness. There is no rebound.    Musculoskeletal: She exhibits edema.       Arms:      Legs: Lymphadenopathy:    She has no cervical adenopathy.          Assessment & Plan:

## 2012-04-19 DIAGNOSIS — Z792 Long term (current) use of antibiotics: Secondary | ICD-10-CM | POA: Diagnosis not present

## 2012-04-19 DIAGNOSIS — Z452 Encounter for adjustment and management of vascular access device: Secondary | ICD-10-CM | POA: Diagnosis not present

## 2012-04-19 DIAGNOSIS — Z5181 Encounter for therapeutic drug level monitoring: Secondary | ICD-10-CM | POA: Diagnosis not present

## 2012-04-19 DIAGNOSIS — Z96649 Presence of unspecified artificial hip joint: Secondary | ICD-10-CM | POA: Diagnosis not present

## 2012-04-19 DIAGNOSIS — T8450XA Infection and inflammatory reaction due to unspecified internal joint prosthesis, initial encounter: Secondary | ICD-10-CM | POA: Diagnosis not present

## 2012-04-21 ENCOUNTER — Telehealth: Payer: Self-pay | Admitting: *Deleted

## 2012-04-21 ENCOUNTER — Other Ambulatory Visit: Payer: Self-pay | Admitting: Infectious Disease

## 2012-04-21 DIAGNOSIS — Z452 Encounter for adjustment and management of vascular access device: Secondary | ICD-10-CM | POA: Diagnosis not present

## 2012-04-21 DIAGNOSIS — Z792 Long term (current) use of antibiotics: Secondary | ICD-10-CM | POA: Diagnosis not present

## 2012-04-21 DIAGNOSIS — T8450XA Infection and inflammatory reaction due to unspecified internal joint prosthesis, initial encounter: Secondary | ICD-10-CM | POA: Diagnosis not present

## 2012-04-21 DIAGNOSIS — Z5181 Encounter for therapeutic drug level monitoring: Secondary | ICD-10-CM | POA: Diagnosis not present

## 2012-04-21 DIAGNOSIS — Z96649 Presence of unspecified artificial hip joint: Secondary | ICD-10-CM | POA: Diagnosis not present

## 2012-04-21 MED ORDER — FLUCONAZOLE 150 MG PO TABS: 150.0000 mg | ORAL_TABLET | Freq: Once | ORAL | Status: DC

## 2012-04-21 NOTE — Telephone Encounter (Signed)
Diflucan is coming

## 2012-04-21 NOTE — Telephone Encounter (Signed)
Patient's home health nurse called stating that Dr. Ninetta Lights had asked patient last week if she was having any symptoms of a yeast infection from the antibiotic, and she was not at that time.  She is now c/o of itching and discharge. Her pharmacy is [facility]. Will send note to one of the other MDs as Dr. Ninetta Lights is on vacation. Wendall Mola CMA

## 2012-04-22 DIAGNOSIS — Z792 Long term (current) use of antibiotics: Secondary | ICD-10-CM | POA: Diagnosis not present

## 2012-04-22 DIAGNOSIS — Z452 Encounter for adjustment and management of vascular access device: Secondary | ICD-10-CM | POA: Diagnosis not present

## 2012-04-22 DIAGNOSIS — Z96649 Presence of unspecified artificial hip joint: Secondary | ICD-10-CM | POA: Diagnosis not present

## 2012-04-22 DIAGNOSIS — T8450XA Infection and inflammatory reaction due to unspecified internal joint prosthesis, initial encounter: Secondary | ICD-10-CM | POA: Diagnosis not present

## 2012-04-22 DIAGNOSIS — Z5181 Encounter for therapeutic drug level monitoring: Secondary | ICD-10-CM | POA: Diagnosis not present

## 2012-04-23 DIAGNOSIS — T8450XA Infection and inflammatory reaction due to unspecified internal joint prosthesis, initial encounter: Secondary | ICD-10-CM | POA: Diagnosis not present

## 2012-04-23 DIAGNOSIS — Z792 Long term (current) use of antibiotics: Secondary | ICD-10-CM | POA: Diagnosis not present

## 2012-04-23 DIAGNOSIS — Z452 Encounter for adjustment and management of vascular access device: Secondary | ICD-10-CM | POA: Diagnosis not present

## 2012-04-23 DIAGNOSIS — Z96649 Presence of unspecified artificial hip joint: Secondary | ICD-10-CM | POA: Diagnosis not present

## 2012-04-23 DIAGNOSIS — Z5181 Encounter for therapeutic drug level monitoring: Secondary | ICD-10-CM | POA: Diagnosis not present

## 2012-04-24 DIAGNOSIS — T8450XA Infection and inflammatory reaction due to unspecified internal joint prosthesis, initial encounter: Secondary | ICD-10-CM | POA: Diagnosis not present

## 2012-04-24 DIAGNOSIS — Z792 Long term (current) use of antibiotics: Secondary | ICD-10-CM | POA: Diagnosis not present

## 2012-04-24 DIAGNOSIS — Z96649 Presence of unspecified artificial hip joint: Secondary | ICD-10-CM | POA: Diagnosis not present

## 2012-04-24 DIAGNOSIS — Z452 Encounter for adjustment and management of vascular access device: Secondary | ICD-10-CM | POA: Diagnosis not present

## 2012-04-24 DIAGNOSIS — Z5181 Encounter for therapeutic drug level monitoring: Secondary | ICD-10-CM | POA: Diagnosis not present

## 2012-04-25 DIAGNOSIS — Z792 Long term (current) use of antibiotics: Secondary | ICD-10-CM | POA: Diagnosis not present

## 2012-04-25 DIAGNOSIS — Z5181 Encounter for therapeutic drug level monitoring: Secondary | ICD-10-CM | POA: Diagnosis not present

## 2012-04-25 DIAGNOSIS — T8450XA Infection and inflammatory reaction due to unspecified internal joint prosthesis, initial encounter: Secondary | ICD-10-CM | POA: Diagnosis not present

## 2012-04-25 DIAGNOSIS — Z96649 Presence of unspecified artificial hip joint: Secondary | ICD-10-CM | POA: Diagnosis not present

## 2012-04-25 DIAGNOSIS — Z452 Encounter for adjustment and management of vascular access device: Secondary | ICD-10-CM | POA: Diagnosis not present

## 2012-04-28 DIAGNOSIS — Z792 Long term (current) use of antibiotics: Secondary | ICD-10-CM | POA: Diagnosis not present

## 2012-04-28 DIAGNOSIS — Z5181 Encounter for therapeutic drug level monitoring: Secondary | ICD-10-CM | POA: Diagnosis not present

## 2012-04-28 DIAGNOSIS — T8450XA Infection and inflammatory reaction due to unspecified internal joint prosthesis, initial encounter: Secondary | ICD-10-CM | POA: Diagnosis not present

## 2012-04-28 DIAGNOSIS — Z452 Encounter for adjustment and management of vascular access device: Secondary | ICD-10-CM | POA: Diagnosis not present

## 2012-04-28 DIAGNOSIS — Z96649 Presence of unspecified artificial hip joint: Secondary | ICD-10-CM | POA: Diagnosis not present

## 2012-04-30 ENCOUNTER — Telehealth: Payer: Self-pay | Admitting: *Deleted

## 2012-04-30 DIAGNOSIS — Z96649 Presence of unspecified artificial hip joint: Secondary | ICD-10-CM | POA: Diagnosis not present

## 2012-04-30 DIAGNOSIS — T8450XA Infection and inflammatory reaction due to unspecified internal joint prosthesis, initial encounter: Secondary | ICD-10-CM | POA: Diagnosis not present

## 2012-04-30 DIAGNOSIS — Z452 Encounter for adjustment and management of vascular access device: Secondary | ICD-10-CM | POA: Diagnosis not present

## 2012-04-30 DIAGNOSIS — Z5181 Encounter for therapeutic drug level monitoring: Secondary | ICD-10-CM | POA: Diagnosis not present

## 2012-04-30 DIAGNOSIS — Z792 Long term (current) use of antibiotics: Secondary | ICD-10-CM | POA: Diagnosis not present

## 2012-04-30 NOTE — Telephone Encounter (Signed)
thanks

## 2012-04-30 NOTE — Telephone Encounter (Signed)
Michelle at the patient [facility] called and advised they have 05/16/12 as the patient stop date but that the patient advised that at her last visit she was told 05/12/12. After review of the note from 04/18/12 it states that doctor Ninetta Lights wants the med stopped and PICC pulled at 05/12/12 as the patient reported. Marcelino Duster advised she will change the order and fax it for Dr Ninetta Lights signature.

## 2012-04-30 NOTE — Telephone Encounter (Signed)
Mark with patients infusion company called to advise that the patient recent Vanc trough was 22.2 and he is going to adjust the patient Vanc from 750 mg q12h to 500 mg q12h through next blood draw. Advised Loraine Leriche will make a note in patient chart.

## 2012-05-01 DIAGNOSIS — Z96649 Presence of unspecified artificial hip joint: Secondary | ICD-10-CM | POA: Diagnosis not present

## 2012-05-01 DIAGNOSIS — T8450XA Infection and inflammatory reaction due to unspecified internal joint prosthesis, initial encounter: Secondary | ICD-10-CM | POA: Diagnosis not present

## 2012-05-01 DIAGNOSIS — Z792 Long term (current) use of antibiotics: Secondary | ICD-10-CM | POA: Diagnosis not present

## 2012-05-01 DIAGNOSIS — Z5181 Encounter for therapeutic drug level monitoring: Secondary | ICD-10-CM | POA: Diagnosis not present

## 2012-05-01 DIAGNOSIS — Z452 Encounter for adjustment and management of vascular access device: Secondary | ICD-10-CM | POA: Diagnosis not present

## 2012-05-02 DIAGNOSIS — T8450XA Infection and inflammatory reaction due to unspecified internal joint prosthesis, initial encounter: Secondary | ICD-10-CM | POA: Diagnosis not present

## 2012-05-02 DIAGNOSIS — Z792 Long term (current) use of antibiotics: Secondary | ICD-10-CM | POA: Diagnosis not present

## 2012-05-02 DIAGNOSIS — Z452 Encounter for adjustment and management of vascular access device: Secondary | ICD-10-CM | POA: Diagnosis not present

## 2012-05-02 DIAGNOSIS — Z96649 Presence of unspecified artificial hip joint: Secondary | ICD-10-CM | POA: Diagnosis not present

## 2012-05-02 DIAGNOSIS — Z5181 Encounter for therapeutic drug level monitoring: Secondary | ICD-10-CM | POA: Diagnosis not present

## 2012-05-05 ENCOUNTER — Telehealth: Payer: Self-pay | Admitting: *Deleted

## 2012-05-05 DIAGNOSIS — Z452 Encounter for adjustment and management of vascular access device: Secondary | ICD-10-CM | POA: Diagnosis not present

## 2012-05-05 DIAGNOSIS — Z5181 Encounter for therapeutic drug level monitoring: Secondary | ICD-10-CM | POA: Diagnosis not present

## 2012-05-05 DIAGNOSIS — T8450XA Infection and inflammatory reaction due to unspecified internal joint prosthesis, initial encounter: Secondary | ICD-10-CM | POA: Diagnosis not present

## 2012-05-05 DIAGNOSIS — Z792 Long term (current) use of antibiotics: Secondary | ICD-10-CM | POA: Diagnosis not present

## 2012-05-05 DIAGNOSIS — Z96649 Presence of unspecified artificial hip joint: Secondary | ICD-10-CM | POA: Diagnosis not present

## 2012-05-05 NOTE — Telephone Encounter (Signed)
Roxanne from [facility] called stating patient has had some itching and a minor rash on abdomen. Will advise patient to take Benadryl and if rash worsens will inform Dr. Ninetta Lights. Wendall Mola CMA

## 2012-05-06 DIAGNOSIS — M25559 Pain in unspecified hip: Secondary | ICD-10-CM | POA: Diagnosis not present

## 2012-05-06 DIAGNOSIS — Z96649 Presence of unspecified artificial hip joint: Secondary | ICD-10-CM | POA: Diagnosis not present

## 2012-05-06 DIAGNOSIS — Z5181 Encounter for therapeutic drug level monitoring: Secondary | ICD-10-CM | POA: Diagnosis not present

## 2012-05-06 DIAGNOSIS — Z452 Encounter for adjustment and management of vascular access device: Secondary | ICD-10-CM | POA: Diagnosis not present

## 2012-05-06 DIAGNOSIS — T8450XA Infection and inflammatory reaction due to unspecified internal joint prosthesis, initial encounter: Secondary | ICD-10-CM | POA: Diagnosis not present

## 2012-05-06 DIAGNOSIS — Z792 Long term (current) use of antibiotics: Secondary | ICD-10-CM | POA: Diagnosis not present

## 2012-05-07 DIAGNOSIS — T8450XA Infection and inflammatory reaction due to unspecified internal joint prosthesis, initial encounter: Secondary | ICD-10-CM | POA: Diagnosis not present

## 2012-05-07 DIAGNOSIS — Z452 Encounter for adjustment and management of vascular access device: Secondary | ICD-10-CM | POA: Diagnosis not present

## 2012-05-07 DIAGNOSIS — Z96649 Presence of unspecified artificial hip joint: Secondary | ICD-10-CM | POA: Diagnosis not present

## 2012-05-07 DIAGNOSIS — Z5181 Encounter for therapeutic drug level monitoring: Secondary | ICD-10-CM | POA: Diagnosis not present

## 2012-05-07 DIAGNOSIS — Z792 Long term (current) use of antibiotics: Secondary | ICD-10-CM | POA: Diagnosis not present

## 2012-05-08 ENCOUNTER — Telehealth: Payer: Self-pay | Admitting: *Deleted

## 2012-05-08 NOTE — Telephone Encounter (Signed)
Pt's son with questions about PO antibiotic and how to take starting next Monday, Nov. 4, 2013.  RN reviewed prescription instructions with the son.  He verbalized back these instructions.  Reminded of next appt 06/02/12 @ 1345.

## 2012-05-09 ENCOUNTER — Encounter: Payer: Self-pay | Admitting: Infectious Diseases

## 2012-05-09 DIAGNOSIS — Z792 Long term (current) use of antibiotics: Secondary | ICD-10-CM | POA: Diagnosis not present

## 2012-05-09 DIAGNOSIS — Z452 Encounter for adjustment and management of vascular access device: Secondary | ICD-10-CM | POA: Diagnosis not present

## 2012-05-09 DIAGNOSIS — Z96649 Presence of unspecified artificial hip joint: Secondary | ICD-10-CM | POA: Diagnosis not present

## 2012-05-09 DIAGNOSIS — T8450XA Infection and inflammatory reaction due to unspecified internal joint prosthesis, initial encounter: Secondary | ICD-10-CM | POA: Diagnosis not present

## 2012-05-09 DIAGNOSIS — Z5181 Encounter for therapeutic drug level monitoring: Secondary | ICD-10-CM | POA: Diagnosis not present

## 2012-05-12 DIAGNOSIS — T8450XA Infection and inflammatory reaction due to unspecified internal joint prosthesis, initial encounter: Secondary | ICD-10-CM | POA: Diagnosis not present

## 2012-05-12 DIAGNOSIS — Z96649 Presence of unspecified artificial hip joint: Secondary | ICD-10-CM | POA: Diagnosis not present

## 2012-05-12 DIAGNOSIS — Z452 Encounter for adjustment and management of vascular access device: Secondary | ICD-10-CM | POA: Diagnosis not present

## 2012-05-12 DIAGNOSIS — Z792 Long term (current) use of antibiotics: Secondary | ICD-10-CM | POA: Diagnosis not present

## 2012-05-12 DIAGNOSIS — Z5181 Encounter for therapeutic drug level monitoring: Secondary | ICD-10-CM | POA: Diagnosis not present

## 2012-05-13 DIAGNOSIS — Z452 Encounter for adjustment and management of vascular access device: Secondary | ICD-10-CM | POA: Diagnosis not present

## 2012-05-13 DIAGNOSIS — Z5181 Encounter for therapeutic drug level monitoring: Secondary | ICD-10-CM | POA: Diagnosis not present

## 2012-05-13 DIAGNOSIS — T8450XA Infection and inflammatory reaction due to unspecified internal joint prosthesis, initial encounter: Secondary | ICD-10-CM | POA: Diagnosis not present

## 2012-05-13 DIAGNOSIS — Z96649 Presence of unspecified artificial hip joint: Secondary | ICD-10-CM | POA: Diagnosis not present

## 2012-05-13 DIAGNOSIS — Z792 Long term (current) use of antibiotics: Secondary | ICD-10-CM | POA: Diagnosis not present

## 2012-05-14 ENCOUNTER — Encounter: Payer: Self-pay | Admitting: Infectious Diseases

## 2012-05-14 DIAGNOSIS — Z792 Long term (current) use of antibiotics: Secondary | ICD-10-CM | POA: Diagnosis not present

## 2012-05-14 DIAGNOSIS — Z96649 Presence of unspecified artificial hip joint: Secondary | ICD-10-CM | POA: Diagnosis not present

## 2012-05-14 DIAGNOSIS — Z5181 Encounter for therapeutic drug level monitoring: Secondary | ICD-10-CM | POA: Diagnosis not present

## 2012-05-14 DIAGNOSIS — T8450XA Infection and inflammatory reaction due to unspecified internal joint prosthesis, initial encounter: Secondary | ICD-10-CM | POA: Diagnosis not present

## 2012-05-14 DIAGNOSIS — Z452 Encounter for adjustment and management of vascular access device: Secondary | ICD-10-CM | POA: Diagnosis not present

## 2012-05-16 DIAGNOSIS — Z96649 Presence of unspecified artificial hip joint: Secondary | ICD-10-CM | POA: Diagnosis not present

## 2012-05-16 DIAGNOSIS — T8450XA Infection and inflammatory reaction due to unspecified internal joint prosthesis, initial encounter: Secondary | ICD-10-CM | POA: Diagnosis not present

## 2012-05-16 DIAGNOSIS — Z5181 Encounter for therapeutic drug level monitoring: Secondary | ICD-10-CM | POA: Diagnosis not present

## 2012-05-16 DIAGNOSIS — Z452 Encounter for adjustment and management of vascular access device: Secondary | ICD-10-CM | POA: Diagnosis not present

## 2012-05-16 DIAGNOSIS — Z792 Long term (current) use of antibiotics: Secondary | ICD-10-CM | POA: Diagnosis not present

## 2012-05-19 DIAGNOSIS — Z792 Long term (current) use of antibiotics: Secondary | ICD-10-CM | POA: Diagnosis not present

## 2012-05-19 DIAGNOSIS — Z5181 Encounter for therapeutic drug level monitoring: Secondary | ICD-10-CM | POA: Diagnosis not present

## 2012-05-19 DIAGNOSIS — Z452 Encounter for adjustment and management of vascular access device: Secondary | ICD-10-CM | POA: Diagnosis not present

## 2012-05-19 DIAGNOSIS — T8450XA Infection and inflammatory reaction due to unspecified internal joint prosthesis, initial encounter: Secondary | ICD-10-CM | POA: Diagnosis not present

## 2012-05-19 DIAGNOSIS — Z96649 Presence of unspecified artificial hip joint: Secondary | ICD-10-CM | POA: Diagnosis not present

## 2012-05-20 DIAGNOSIS — M25559 Pain in unspecified hip: Secondary | ICD-10-CM | POA: Diagnosis not present

## 2012-05-21 DIAGNOSIS — Z792 Long term (current) use of antibiotics: Secondary | ICD-10-CM | POA: Diagnosis not present

## 2012-05-21 DIAGNOSIS — T8450XA Infection and inflammatory reaction due to unspecified internal joint prosthesis, initial encounter: Secondary | ICD-10-CM | POA: Diagnosis not present

## 2012-05-21 DIAGNOSIS — Z452 Encounter for adjustment and management of vascular access device: Secondary | ICD-10-CM | POA: Diagnosis not present

## 2012-05-21 DIAGNOSIS — Z5181 Encounter for therapeutic drug level monitoring: Secondary | ICD-10-CM | POA: Diagnosis not present

## 2012-05-21 DIAGNOSIS — Z96649 Presence of unspecified artificial hip joint: Secondary | ICD-10-CM | POA: Diagnosis not present

## 2012-05-22 ENCOUNTER — Encounter: Payer: Self-pay | Admitting: Infectious Diseases

## 2012-05-22 DIAGNOSIS — Z452 Encounter for adjustment and management of vascular access device: Secondary | ICD-10-CM | POA: Diagnosis not present

## 2012-05-22 DIAGNOSIS — T8450XA Infection and inflammatory reaction due to unspecified internal joint prosthesis, initial encounter: Secondary | ICD-10-CM | POA: Diagnosis not present

## 2012-05-22 DIAGNOSIS — Z96649 Presence of unspecified artificial hip joint: Secondary | ICD-10-CM | POA: Diagnosis not present

## 2012-05-22 DIAGNOSIS — Z792 Long term (current) use of antibiotics: Secondary | ICD-10-CM | POA: Diagnosis not present

## 2012-05-22 DIAGNOSIS — Z5181 Encounter for therapeutic drug level monitoring: Secondary | ICD-10-CM | POA: Diagnosis not present

## 2012-05-23 DIAGNOSIS — Z5181 Encounter for therapeutic drug level monitoring: Secondary | ICD-10-CM | POA: Diagnosis not present

## 2012-05-23 DIAGNOSIS — Z792 Long term (current) use of antibiotics: Secondary | ICD-10-CM | POA: Diagnosis not present

## 2012-05-23 DIAGNOSIS — Z452 Encounter for adjustment and management of vascular access device: Secondary | ICD-10-CM | POA: Diagnosis not present

## 2012-05-23 DIAGNOSIS — Z96649 Presence of unspecified artificial hip joint: Secondary | ICD-10-CM | POA: Diagnosis not present

## 2012-05-23 DIAGNOSIS — T8450XA Infection and inflammatory reaction due to unspecified internal joint prosthesis, initial encounter: Secondary | ICD-10-CM | POA: Diagnosis not present

## 2012-05-26 DIAGNOSIS — Z5181 Encounter for therapeutic drug level monitoring: Secondary | ICD-10-CM | POA: Diagnosis not present

## 2012-05-26 DIAGNOSIS — Z452 Encounter for adjustment and management of vascular access device: Secondary | ICD-10-CM | POA: Diagnosis not present

## 2012-05-26 DIAGNOSIS — Z792 Long term (current) use of antibiotics: Secondary | ICD-10-CM | POA: Diagnosis not present

## 2012-05-26 DIAGNOSIS — T8450XA Infection and inflammatory reaction due to unspecified internal joint prosthesis, initial encounter: Secondary | ICD-10-CM | POA: Diagnosis not present

## 2012-05-26 DIAGNOSIS — Z96649 Presence of unspecified artificial hip joint: Secondary | ICD-10-CM | POA: Diagnosis not present

## 2012-05-28 DIAGNOSIS — Z5181 Encounter for therapeutic drug level monitoring: Secondary | ICD-10-CM | POA: Diagnosis not present

## 2012-05-28 DIAGNOSIS — Z96649 Presence of unspecified artificial hip joint: Secondary | ICD-10-CM | POA: Diagnosis not present

## 2012-05-28 DIAGNOSIS — Z792 Long term (current) use of antibiotics: Secondary | ICD-10-CM | POA: Diagnosis not present

## 2012-05-28 DIAGNOSIS — Z452 Encounter for adjustment and management of vascular access device: Secondary | ICD-10-CM | POA: Diagnosis not present

## 2012-05-28 DIAGNOSIS — T8450XA Infection and inflammatory reaction due to unspecified internal joint prosthesis, initial encounter: Secondary | ICD-10-CM | POA: Diagnosis not present

## 2012-05-29 DIAGNOSIS — T8450XA Infection and inflammatory reaction due to unspecified internal joint prosthesis, initial encounter: Secondary | ICD-10-CM | POA: Diagnosis not present

## 2012-05-29 DIAGNOSIS — Z96649 Presence of unspecified artificial hip joint: Secondary | ICD-10-CM | POA: Diagnosis not present

## 2012-05-29 DIAGNOSIS — Z5181 Encounter for therapeutic drug level monitoring: Secondary | ICD-10-CM | POA: Diagnosis not present

## 2012-05-29 DIAGNOSIS — Z792 Long term (current) use of antibiotics: Secondary | ICD-10-CM | POA: Diagnosis not present

## 2012-05-29 DIAGNOSIS — Z452 Encounter for adjustment and management of vascular access device: Secondary | ICD-10-CM | POA: Diagnosis not present

## 2012-05-30 DIAGNOSIS — Z5181 Encounter for therapeutic drug level monitoring: Secondary | ICD-10-CM | POA: Diagnosis not present

## 2012-05-30 DIAGNOSIS — Z96649 Presence of unspecified artificial hip joint: Secondary | ICD-10-CM | POA: Diagnosis not present

## 2012-05-30 DIAGNOSIS — T8450XA Infection and inflammatory reaction due to unspecified internal joint prosthesis, initial encounter: Secondary | ICD-10-CM | POA: Diagnosis not present

## 2012-05-30 DIAGNOSIS — Z792 Long term (current) use of antibiotics: Secondary | ICD-10-CM | POA: Diagnosis not present

## 2012-05-30 DIAGNOSIS — Z452 Encounter for adjustment and management of vascular access device: Secondary | ICD-10-CM | POA: Diagnosis not present

## 2012-06-02 ENCOUNTER — Encounter: Payer: Self-pay | Admitting: Infectious Diseases

## 2012-06-02 ENCOUNTER — Ambulatory Visit (INDEPENDENT_AMBULATORY_CARE_PROVIDER_SITE_OTHER): Payer: Medicare Other | Admitting: Infectious Diseases

## 2012-06-02 VITALS — BP 130/83 | HR 91 | Temp 97.7°F | Ht 64.0 in | Wt 135.0 lb

## 2012-06-02 DIAGNOSIS — Z5181 Encounter for therapeutic drug level monitoring: Secondary | ICD-10-CM | POA: Diagnosis not present

## 2012-06-02 DIAGNOSIS — M869 Osteomyelitis, unspecified: Secondary | ICD-10-CM | POA: Diagnosis not present

## 2012-06-02 DIAGNOSIS — Z96649 Presence of unspecified artificial hip joint: Secondary | ICD-10-CM | POA: Diagnosis not present

## 2012-06-02 DIAGNOSIS — T8450XA Infection and inflammatory reaction due to unspecified internal joint prosthesis, initial encounter: Secondary | ICD-10-CM | POA: Diagnosis not present

## 2012-06-02 DIAGNOSIS — Z792 Long term (current) use of antibiotics: Secondary | ICD-10-CM | POA: Diagnosis not present

## 2012-06-02 DIAGNOSIS — Z452 Encounter for adjustment and management of vascular access device: Secondary | ICD-10-CM | POA: Diagnosis not present

## 2012-06-02 NOTE — Assessment & Plan Note (Addendum)
She is doing very well. Will check her ESR and CRP today. Plan on seeing her back in May 2014 and stopping her anbx then provided her inflammatory markers are fine. She has f/u with her orthopedist in 2 weeks, may have VAC d/c then. She gets a flu shot today as well.

## 2012-06-02 NOTE — Progress Notes (Signed)
  Subjective:    Patient ID: Renee Adams, female    DOB: 05/01/1934, 78 y.o.   MRN: [id number]  HPI 78 yo F with hx of L and R THR 1997. She had "re-do" of her L hip 2007.  She returned 9-23 after developing pain, swelling and erythema of her L hip since August 2nd. She also had pain and swelling in her L foot. She also had UTI during this period, she was treated with cipro for 7 days, then 10 days of doxy (completed 9-4).  She developed f/c, was seen in the outpt clinic and had aspirate of her hip that grew GBS (I-erythro, otherwise sensitive) (03-24-12). She was taken to OR on 9-24 and underwent exchange of parts, wash out. Her Cx grew GBS (S agalictiae). She was started on ceftriaxone and rifampin and d/c home on 9-27 on vanco (presumed secondary to ceftriaxone vs rifampin).  Stop date was November 4. She was changed to levaquin at that time.  Still has wound vac, using cane. Mostly when she goes out. No fever or chills, pic is out.      Review of Systems  Constitutional: Negative for fever, chills, appetite change and unexpected weight change.  Gastrointestinal: Negative for diarrhea and constipation.  Genitourinary: Negative for vaginal discharge and difficulty urinating.       Objective:   Physical Exam  Constitutional: She appears well-developed and well-nourished.  HENT:  Mouth/Throat: No oropharyngeal exudate.  Eyes: EOM are normal.  Cardiovascular: Normal rate, regular rhythm and normal heart sounds.   Pulmonary/Chest: Effort normal and breath sounds normal.  Abdominal: Soft. Bowel sounds are normal. There is no tenderness.  Musculoskeletal:       Legs:         Assessment & Plan:

## 2012-06-03 LAB — SEDIMENTATION RATE: Sed Rate: 9 mm/hr (ref 0–22)

## 2012-06-04 ENCOUNTER — Encounter: Payer: Self-pay | Admitting: Infectious Diseases

## 2012-06-04 DIAGNOSIS — Z96649 Presence of unspecified artificial hip joint: Secondary | ICD-10-CM | POA: Diagnosis not present

## 2012-06-04 DIAGNOSIS — T8450XA Infection and inflammatory reaction due to unspecified internal joint prosthesis, initial encounter: Secondary | ICD-10-CM | POA: Diagnosis not present

## 2012-06-04 DIAGNOSIS — Z5181 Encounter for therapeutic drug level monitoring: Secondary | ICD-10-CM | POA: Diagnosis not present

## 2012-06-04 DIAGNOSIS — Z452 Encounter for adjustment and management of vascular access device: Secondary | ICD-10-CM | POA: Diagnosis not present

## 2012-06-04 DIAGNOSIS — Z792 Long term (current) use of antibiotics: Secondary | ICD-10-CM | POA: Diagnosis not present

## 2012-06-06 DIAGNOSIS — Z792 Long term (current) use of antibiotics: Secondary | ICD-10-CM | POA: Diagnosis not present

## 2012-06-06 DIAGNOSIS — Z96649 Presence of unspecified artificial hip joint: Secondary | ICD-10-CM | POA: Diagnosis not present

## 2012-06-06 DIAGNOSIS — Z5181 Encounter for therapeutic drug level monitoring: Secondary | ICD-10-CM | POA: Diagnosis not present

## 2012-06-06 DIAGNOSIS — Z452 Encounter for adjustment and management of vascular access device: Secondary | ICD-10-CM | POA: Diagnosis not present

## 2012-06-06 DIAGNOSIS — T8450XA Infection and inflammatory reaction due to unspecified internal joint prosthesis, initial encounter: Secondary | ICD-10-CM | POA: Diagnosis not present

## 2012-06-09 DIAGNOSIS — Z96649 Presence of unspecified artificial hip joint: Secondary | ICD-10-CM | POA: Diagnosis not present

## 2012-06-09 DIAGNOSIS — Z5181 Encounter for therapeutic drug level monitoring: Secondary | ICD-10-CM | POA: Diagnosis not present

## 2012-06-09 DIAGNOSIS — Z792 Long term (current) use of antibiotics: Secondary | ICD-10-CM | POA: Diagnosis not present

## 2012-06-09 DIAGNOSIS — Z452 Encounter for adjustment and management of vascular access device: Secondary | ICD-10-CM | POA: Diagnosis not present

## 2012-06-09 DIAGNOSIS — T8450XA Infection and inflammatory reaction due to unspecified internal joint prosthesis, initial encounter: Secondary | ICD-10-CM | POA: Diagnosis not present

## 2012-06-10 ENCOUNTER — Encounter: Payer: Self-pay | Admitting: Infectious Diseases

## 2012-06-11 DIAGNOSIS — Z96649 Presence of unspecified artificial hip joint: Secondary | ICD-10-CM | POA: Diagnosis not present

## 2012-06-11 DIAGNOSIS — Z792 Long term (current) use of antibiotics: Secondary | ICD-10-CM | POA: Diagnosis not present

## 2012-06-11 DIAGNOSIS — T8450XA Infection and inflammatory reaction due to unspecified internal joint prosthesis, initial encounter: Secondary | ICD-10-CM | POA: Diagnosis not present

## 2012-06-11 DIAGNOSIS — Z452 Encounter for adjustment and management of vascular access device: Secondary | ICD-10-CM | POA: Diagnosis not present

## 2012-06-11 DIAGNOSIS — Z5181 Encounter for therapeutic drug level monitoring: Secondary | ICD-10-CM | POA: Diagnosis not present

## 2012-06-17 DIAGNOSIS — M25559 Pain in unspecified hip: Secondary | ICD-10-CM | POA: Diagnosis not present

## 2012-06-17 DIAGNOSIS — Z5181 Encounter for therapeutic drug level monitoring: Secondary | ICD-10-CM | POA: Diagnosis not present

## 2012-06-17 DIAGNOSIS — Z452 Encounter for adjustment and management of vascular access device: Secondary | ICD-10-CM | POA: Diagnosis not present

## 2012-06-17 DIAGNOSIS — Z96649 Presence of unspecified artificial hip joint: Secondary | ICD-10-CM | POA: Diagnosis not present

## 2012-06-17 DIAGNOSIS — T8450XA Infection and inflammatory reaction due to unspecified internal joint prosthesis, initial encounter: Secondary | ICD-10-CM | POA: Diagnosis not present

## 2012-06-17 DIAGNOSIS — Z792 Long term (current) use of antibiotics: Secondary | ICD-10-CM | POA: Diagnosis not present

## 2012-06-27 DIAGNOSIS — J069 Acute upper respiratory infection, unspecified: Secondary | ICD-10-CM | POA: Diagnosis not present

## 2012-07-11 ENCOUNTER — Ambulatory Visit
Admission: RE | Admit: 2012-07-11 | Discharge: 2012-07-11 | Disposition: A | Discharge status: Home / Self Care | Payer: Medicare Other | Source: Ambulatory Visit | Attending: General Surgery | Admitting: General Surgery

## 2012-07-11 DIAGNOSIS — Z9889 Other specified postprocedural states: Secondary | ICD-10-CM

## 2012-07-11 DIAGNOSIS — Z853 Personal history of malignant neoplasm of breast: Secondary | ICD-10-CM | POA: Diagnosis not present

## 2012-07-11 DIAGNOSIS — Z1231 Encounter for screening mammogram for malignant neoplasm of breast: Secondary | ICD-10-CM

## 2012-07-14 DIAGNOSIS — I6789 Other cerebrovascular disease: Secondary | ICD-10-CM | POA: Diagnosis not present

## 2012-07-14 DIAGNOSIS — Z7901 Long term (current) use of anticoagulants: Secondary | ICD-10-CM | POA: Diagnosis not present

## 2012-07-28 DIAGNOSIS — Z7901 Long term (current) use of anticoagulants: Secondary | ICD-10-CM | POA: Diagnosis not present

## 2012-07-28 DIAGNOSIS — I6789 Other cerebrovascular disease: Secondary | ICD-10-CM | POA: Diagnosis not present

## 2012-08-12 DIAGNOSIS — I6789 Other cerebrovascular disease: Secondary | ICD-10-CM | POA: Diagnosis not present

## 2012-08-12 DIAGNOSIS — Z7901 Long term (current) use of anticoagulants: Secondary | ICD-10-CM | POA: Diagnosis not present

## 2012-08-16 DIAGNOSIS — Z888 Allergy status to other drugs, medicaments and biological substances status: Secondary | ICD-10-CM | POA: Diagnosis not present

## 2012-08-16 DIAGNOSIS — L509 Urticaria, unspecified: Secondary | ICD-10-CM | POA: Diagnosis not present

## 2012-08-16 DIAGNOSIS — R300 Dysuria: Secondary | ICD-10-CM | POA: Diagnosis not present

## 2012-08-16 DIAGNOSIS — N3000 Acute cystitis without hematuria: Secondary | ICD-10-CM | POA: Diagnosis not present

## 2012-08-18 ENCOUNTER — Encounter: Payer: Self-pay | Admitting: Infectious Diseases

## 2012-08-18 ENCOUNTER — Ambulatory Visit (INDEPENDENT_AMBULATORY_CARE_PROVIDER_SITE_OTHER): Payer: Medicare Other | Admitting: Infectious Diseases

## 2012-08-18 VITALS — BP 156/88 | HR 84 | Temp 97.9°F | Ht 64.0 in | Wt 123.0 lb

## 2012-08-18 DIAGNOSIS — I10 Essential (primary) hypertension: Secondary | ICD-10-CM

## 2012-08-18 DIAGNOSIS — M869 Osteomyelitis, unspecified: Secondary | ICD-10-CM

## 2012-08-18 NOTE — Assessment & Plan Note (Signed)
Have asked her to call her PCP to let them know this (could be due to steroids?). As well, need to let them know that her coumadin dose may need to be changed now that she is off anbx.

## 2012-08-18 NOTE — Assessment & Plan Note (Signed)
Will stop her levaquin as done by her PCP. Agree that this is most likely cause. Her wound os is clean, no d/c. I have some concern that she may have a sinus tract here. She had previous inflamatory markers that were normal. I am not sure that repeating them now (in the midst of her allergic reaction, and now on steroids) would be valid. I have asked her to remain off anbx, will repeat her inflammatory markers at her f/u in June. I have specifically asked her to call us if she has any fevers, chills, change in wt bearing, drainage from her wound.

## 2012-08-18 NOTE — Progress Notes (Signed)
  Subjective:    Patient ID: Renee Adams, female    DOB: February 10, 1934, 79 y.o.   MRN: [id number]  HPI 79 yo F with hx of L and R THR 1997. She had "re-do" of her L hip 2007.  She returned 03-31-12 after developing pain, swelling and erythema of her L hip since August 2nd. She also had pain and swelling in her L foot. She also had UTI during this period, she was treated with cipro for 7 days, then 10 days of doxy (completed 9-4).  She developed f/c, was seen in the outpt clinic and had aspirate of her hip that grew GBS (I-erythro, otherwise sensitive) (03-24-12). She was taken to OR on 04-01-12 and underwent exchange of parts, wash out. Her Cx grew GBS (S agalictiae). She was started on ceftriaxone and rifampin and d/c home on 04-04-12 on vanco (rash presumed secondary to ceftriaxone vs rifampin).  Stop date was November 4. She was changed to levaquin at that time.  She was seen in f/u in ID on 06-02-12 and was doing well- CRP and ESR both normal.  On 08-16-12 she was seen in her MDs [facility]) office with a new rash for 12 hours. Involved her trunk and back. Involved her arms as well. Was started on prednisone and levaquin was stopped.  Rash has improved today, able to sleep last night. No dysphagia, no SOB. No visual problems.  No new soaps, shampoos, laundry detergent or dishwashing liquid.   No fever or chills.  Hip has been doing well, incision is still weeping a "tiny bit". Ability to bear wt and walk is good, mild weakness.  Has not taken robaxin, oxycodone for awhile.    Review of Systems  Constitutional: Negative for fever and chills.  Eyes: Negative for visual disturbance.  Cardiovascular: Negative for chest pain.  Gastrointestinal: Negative for diarrhea and constipation.  Genitourinary: Negative for dysuria.  Skin: Positive for rash.  Neurological: Negative for headaches.       Objective:   Physical Exam  Constitutional: She appears well-developed and well-nourished.  HENT:    Mouth/Throat: No oropharyngeal exudate.  Eyes: EOM are normal. Pupils are equal, round, and reactive to light.  Neck: Neck supple.  Cardiovascular: Normal rate, regular rhythm and normal heart sounds.   Pulmonary/Chest: Effort normal and breath sounds normal.  Abdominal: Soft. Bowel sounds are normal. There is no tenderness.  Lymphadenopathy:    She has no cervical adenopathy.  Skin: Rash noted.             Assessment & Plan:

## 2012-08-19 DIAGNOSIS — I6789 Other cerebrovascular disease: Secondary | ICD-10-CM | POA: Diagnosis not present

## 2012-08-19 DIAGNOSIS — Z7901 Long term (current) use of anticoagulants: Secondary | ICD-10-CM | POA: Diagnosis not present

## 2012-08-26 DIAGNOSIS — N3090 Cystitis, unspecified without hematuria: Secondary | ICD-10-CM | POA: Diagnosis not present

## 2012-08-26 DIAGNOSIS — N952 Postmenopausal atrophic vaginitis: Secondary | ICD-10-CM | POA: Diagnosis not present

## 2012-08-26 DIAGNOSIS — N3946 Mixed incontinence: Secondary | ICD-10-CM | POA: Diagnosis not present

## 2012-08-27 DIAGNOSIS — Z7901 Long term (current) use of anticoagulants: Secondary | ICD-10-CM | POA: Diagnosis not present

## 2012-08-27 DIAGNOSIS — I6789 Other cerebrovascular disease: Secondary | ICD-10-CM | POA: Diagnosis not present

## 2012-09-08 ENCOUNTER — Telehealth: Payer: Self-pay | Admitting: Oncology

## 2012-09-08 ENCOUNTER — Other Ambulatory Visit: Payer: Medicare Other | Admitting: Lab

## 2012-09-09 DIAGNOSIS — Z7901 Long term (current) use of anticoagulants: Secondary | ICD-10-CM | POA: Diagnosis not present

## 2012-09-09 DIAGNOSIS — I6789 Other cerebrovascular disease: Secondary | ICD-10-CM | POA: Diagnosis not present

## 2012-09-10 ENCOUNTER — Other Ambulatory Visit (HOSPITAL_BASED_OUTPATIENT_CLINIC_OR_DEPARTMENT_OTHER): Payer: Medicare Other | Admitting: Lab

## 2012-09-10 DIAGNOSIS — Z853 Personal history of malignant neoplasm of breast: Secondary | ICD-10-CM

## 2012-09-10 DIAGNOSIS — C50912 Malignant neoplasm of unspecified site of left female breast: Secondary | ICD-10-CM

## 2012-09-10 LAB — CBC WITH DIFFERENTIAL/PLATELET
BASO%: 3.1 % — ABNORMAL HIGH (ref 0.0–2.0)
Eosinophils Absolute: 0.2 10*3/uL (ref 0.0–0.5)
LYMPH%: 27.6 % (ref 14.0–49.7)
MCHC: 33.4 g/dL (ref 31.5–36.0)
MCV: 82.8 fL (ref 79.5–101.0)
MONO#: 0.4 10*3/uL (ref 0.1–0.9)
MONO%: 6.3 % (ref 0.0–14.0)
NEUT#: 3.7 10*3/uL (ref 1.5–6.5)
Platelets: 201 10*3/uL (ref 145–400)
RBC: 5 10*6/uL (ref 3.70–5.45)
RDW: 16.8 % — ABNORMAL HIGH (ref 11.2–14.5)
WBC: 6.2 10*3/uL (ref 3.9–10.3)

## 2012-09-10 LAB — COMPREHENSIVE METABOLIC PANEL (CC13)
ALT: 14 U/L (ref 0–55)
Albumin: 3.3 g/dL — ABNORMAL LOW (ref 3.5–5.0)
Alkaline Phosphatase: 85 U/L (ref 40–150)
Glucose: 94 mg/dl (ref 70–99)
Potassium: 4.3 mEq/L (ref 3.5–5.1)
Sodium: 143 mEq/L (ref 136–145)
Total Bilirubin: 0.53 mg/dL (ref 0.20–1.20)
Total Protein: 7 g/dL (ref 6.4–8.3)

## 2012-09-12 ENCOUNTER — Encounter (INDEPENDENT_AMBULATORY_CARE_PROVIDER_SITE_OTHER): Payer: Medicare Other | Admitting: General Surgery

## 2012-09-15 ENCOUNTER — Telehealth: Payer: Self-pay | Admitting: Oncology

## 2012-09-15 ENCOUNTER — Ambulatory Visit (HOSPITAL_BASED_OUTPATIENT_CLINIC_OR_DEPARTMENT_OTHER): Payer: Medicare Other | Admitting: Oncology

## 2012-09-15 VITALS — BP 127/74 | HR 90 | Temp 98.7°F | Resp 20 | Ht 64.0 in | Wt 140.8 lb

## 2012-09-15 DIAGNOSIS — Z853 Personal history of malignant neoplasm of breast: Secondary | ICD-10-CM

## 2012-09-15 DIAGNOSIS — C50912 Malignant neoplasm of unspecified site of left female breast: Secondary | ICD-10-CM

## 2012-09-15 NOTE — Progress Notes (Signed)
Hematology and Oncology Follow Up Visit  Renee Adams [id number]   [id number]  [birth year] 79 y.o. 09/15/2012    PCP:  Benita Stabile, MD    HPI: Renee Adams was originally diagnosed with a left breast carcinoma in early 1996. Tumor was T1, N0, ER and PR positive. She underwent left lumpectomy and sentinel lymph node dissection in March of 1996.  Patient was treated with tamoxifen for 5 years, then Evista for 3 years. The distal was discontinued secondary to history of stroke. Currently being followed with observation alone.  Interim History:   Renee Adams returns today for routine  followup of her remote left breast carcinoma. Interval history is remarkable for what sounds like left hip osteomyelitis, treated with various antibiotics including intravenous vancomycin for 6 weeks, and resolved. She also had a fall last year, and if spring, but no falls since. Family is doing well.  Review of Systems: She is having problems with rectal prolapse and will be seeing Renee Adams for that. She does have some stress urinary incontinence and Dr. Patsi Adams helps her with that. Otherwise a detailed review of systems today was noncontributory. She is not exercising regularly, but when she does she walks up to a mile without great difficulty.  Medications:   Current Outpatient Prescriptions  Medication Sig Dispense Refill  . ALPRAZolam (XANAX) 0.25 MG tablet Take 4 tablets (1 mg total) by mouth every 4 (four) hours as needed for anxiety.  40 tablet  0  . amLODipine (NORVASC) 5 MG tablet Take 5 mg by mouth daily.      Marland Kitchen atorvastatin (LIPITOR) 10 MG tablet Take 10 mg by mouth daily.        Marland Kitchen buPROPion (WELLBUTRIN XL) 300 MG 24 hr tablet Take 300 mg by mouth daily.      . calcium carbonate 200 MG capsule Take 600 mg by mouth 2 (two) times daily with a meal.      . latanoprost (XALATAN) 0.005 % ophthalmic solution Place 1 drop into both eyes at bedtime.      . methocarbamol (ROBAXIN) 500 MG tablet Take  1-2 tablets (500-1,000 mg total) by mouth every 6 (six) hours as needed.  60 tablet  0  . oxyCODONE (OXY IR/ROXICODONE) 5 MG immediate release tablet Take 1-2 tablets (5-10 mg total) by mouth every 4 (four) hours as needed.  90 tablet  0  . PredniSONE 10 MG KIT Take by mouth. Taper as per instructions      . rOPINIRole (REQUIP) 1 MG tablet Take 1 mg by mouth at bedtime as needed. For restless legs      . sertraline (ZOLOFT) 100 MG tablet Take 150 mg by mouth daily.      . sodium chloride 0.9 % SOLN 150 mL with vancomycin 1000 MG SOLR 750 mg Inject 750 mg into the vein every 12 (twelve) hours.      Marland Kitchen warfarin (COUMADIN) 2 MG tablet Take 3 mg by mouth daily. Pt takes 1.5 tab daily      . [DISCONTINUED] pramipexole (MIRAPEX) 0.125 MG tablet Take 0.125 mg by mouth 3 (three) times daily.       No current facility-administered medications for this visit.    Allergies:  Allergies  Allergen Reactions  . Dilaudid (Hydromorphone Hcl) Shortness Of Breath  . Morphine And Related Shortness Of Breath  . Sulfa Antibiotics Other (See Comments)    "Made me drunk";  wobbly  . Keflex (Cephalexin) Nausea Only  . Levaquin (Levofloxacin) Hives and Rash  Physical Exam: Elderly white woman in no acute distress Filed Vitals:   09/15/12 1414  BP: 127/74  Pulse: 90  Temp: 98.7 F (37.1 C)  Resp: 20    Body mass index is 24.16 kg/(m^2). HEENT:  Sclerae anicteric.  Oropharynx clear.  Nodes:  No cervical, supraclavicular, or axillary lymphadenopathy palpated.  Breast Exam:  Right breast showed no masses, skin changes, or nipple inversion. Left breast is status post lumpectomy. Well-healed incisions. No suspicious skin changes.  Lungs:  Clear to auscultation bilaterally.  No crackles, rhonchi, or wheezes.   Heart:  Regular rate and rhythm.   Abdomen:  Soft, nontender.  Positive bowel sounds.  No organomegaly or masses palpated.   Musculoskeletal:  Kyphosis but no focal spinal tenderness to palpation.   Extremities:  No peripheral edema   Neuro:  Nonfocal. Well oriented. Positive affect   Lab Results: Lab Results  Component Value Date   WBC 6.2 09/10/2012   HGB 13.8 09/10/2012   HCT 41.4 09/10/2012   MCV 82.8 09/10/2012   PLT 201 09/10/2012   NEUTROABS 3.7 09/10/2012     Chemistry      Component Value Date/Time   NA 143 09/10/2012 1315   NA 135 04/04/2012 0500   K 4.3 09/10/2012 1315   K 3.4* 04/04/2012 0500   CL 109* 09/10/2012 1315   CL 100 04/04/2012 0500   CO2 24 09/10/2012 1315   CO2 23 04/04/2012 0500   BUN 24.2 09/10/2012 1315   BUN 6 04/04/2012 0500   CREATININE 1.0 09/10/2012 1315   CREATININE 0.55 04/04/2012 0500      Component Value Date/Time   CALCIUM 9.9 09/10/2012 1315   CALCIUM 9.2 04/04/2012 0500   ALKPHOS 85 09/10/2012 1315   ALKPHOS 76 03/28/2012 1533   AST 20 09/10/2012 1315   AST 15 03/28/2012 1533   ALT 14 09/10/2012 1315   ALT 7 03/28/2012 1533   BILITOT 0.53 09/10/2012 1315   BILITOT 0.2* 03/28/2012 1533      Radiological Studies:  No results found.    Assessment:  79 y.o. Renee Adams woman status post left lumpectomy and sentinel lymph node dissection March 1996 for a T1, N0, ER/PR positive breast carcinoma.  Treated with tamoxifen for five years, then Evista for three years with the Evista discontinued secondary to stroke.  Currently being followed with observation alone.    Plan:  Renee Adams is doing fine from a breast cancer point of view. She would like [place] to continue seeing her on her yearly basis (I offered her "graduation") and she will return next March, with labs the week before. Of course she knows to call for any problems that may develop before then. It is very define that there is no evidence of disease recurrence at this late date.   Renee Adams C 09/15/2012

## 2012-09-15 NOTE — Telephone Encounter (Signed)
gv pt appt schedule for March 2015.  °

## 2012-09-25 DIAGNOSIS — Z7901 Long term (current) use of anticoagulants: Secondary | ICD-10-CM | POA: Diagnosis not present

## 2012-09-25 DIAGNOSIS — I6789 Other cerebrovascular disease: Secondary | ICD-10-CM | POA: Diagnosis not present

## 2012-10-21 DIAGNOSIS — I10 Essential (primary) hypertension: Secondary | ICD-10-CM | POA: Diagnosis not present

## 2012-10-21 DIAGNOSIS — I4891 Unspecified atrial fibrillation: Secondary | ICD-10-CM | POA: Diagnosis not present

## 2012-10-21 DIAGNOSIS — E7800 Pure hypercholesterolemia, unspecified: Secondary | ICD-10-CM | POA: Diagnosis not present

## 2012-10-21 DIAGNOSIS — Z7901 Long term (current) use of anticoagulants: Secondary | ICD-10-CM | POA: Diagnosis not present

## 2012-10-21 DIAGNOSIS — I6789 Other cerebrovascular disease: Secondary | ICD-10-CM | POA: Diagnosis not present

## 2012-11-06 ENCOUNTER — Telehealth: Payer: Self-pay | Admitting: *Deleted

## 2012-11-06 NOTE — Telephone Encounter (Signed)
Patient called asking if she could move her 12/08/12 appointment up, as she has red spots on her right leg and is concerned her cellulitis is returning.  First available appointment is 11/17/12 with Dr. Daiva Eves - scheduled.  Patient advised to call her PCP and see if they can evaluate it in the mean time and fax [place] their findings.  Patient agreed, but is concerned that her PCP may not diagnose it correctly, doesn't want another "out of control infection." Providence Lanius, Zachary George, RN

## 2012-11-07 DIAGNOSIS — T148 Other injury of unspecified body region: Secondary | ICD-10-CM | POA: Diagnosis not present

## 2012-11-07 NOTE — Telephone Encounter (Signed)
Will wait for PCP eval, if she is not satisfied with that then overbook thanks

## 2012-11-11 DIAGNOSIS — I6789 Other cerebrovascular disease: Secondary | ICD-10-CM | POA: Diagnosis not present

## 2012-11-11 DIAGNOSIS — R21 Rash and other nonspecific skin eruption: Secondary | ICD-10-CM | POA: Diagnosis not present

## 2012-11-11 DIAGNOSIS — Z7901 Long term (current) use of anticoagulants: Secondary | ICD-10-CM | POA: Diagnosis not present

## 2012-11-13 ENCOUNTER — Telehealth: Payer: Self-pay | Admitting: *Deleted

## 2012-11-13 NOTE — Telephone Encounter (Signed)
Pt contacted PCP and was seen for problem.  Problem has resolved.  Requests appt for next week to be canceled.  RN canceled appt per pt request for 11/17/12 w/ Dr. Ninetta Lights.

## 2012-11-14 DIAGNOSIS — I6789 Other cerebrovascular disease: Secondary | ICD-10-CM | POA: Diagnosis not present

## 2012-11-14 DIAGNOSIS — Z7901 Long term (current) use of anticoagulants: Secondary | ICD-10-CM | POA: Diagnosis not present

## 2012-11-17 ENCOUNTER — Ambulatory Visit: Payer: Medicare Other | Admitting: Infectious Disease

## 2012-11-21 DIAGNOSIS — Z7901 Long term (current) use of anticoagulants: Secondary | ICD-10-CM | POA: Diagnosis not present

## 2012-11-21 DIAGNOSIS — I6789 Other cerebrovascular disease: Secondary | ICD-10-CM | POA: Diagnosis not present

## 2012-11-24 DIAGNOSIS — M899 Disorder of bone, unspecified: Secondary | ICD-10-CM | POA: Diagnosis not present

## 2012-11-24 DIAGNOSIS — N3000 Acute cystitis without hematuria: Secondary | ICD-10-CM | POA: Diagnosis not present

## 2012-11-24 DIAGNOSIS — R300 Dysuria: Secondary | ICD-10-CM | POA: Diagnosis not present

## 2012-11-25 ENCOUNTER — Encounter (HOSPITAL_COMMUNITY): Payer: Self-pay | Admitting: Emergency Medicine

## 2012-11-25 ENCOUNTER — Inpatient Hospital Stay (HOSPITAL_COMMUNITY)
Admission: EM | Admit: 2012-11-25 | Discharge: 2012-12-02 | DRG: 481 | Disposition: A | Discharge status: Rehabilitation Facility | Payer: Medicare Other | Attending: Internal Medicine | Admitting: Internal Medicine

## 2012-11-25 ENCOUNTER — Emergency Department (HOSPITAL_COMMUNITY): Payer: Medicare Other

## 2012-11-25 DIAGNOSIS — Z96642 Presence of left artificial hip joint: Secondary | ICD-10-CM

## 2012-11-25 DIAGNOSIS — IMO0002 Reserved for concepts with insufficient information to code with codable children: Secondary | ICD-10-CM | POA: Diagnosis not present

## 2012-11-25 DIAGNOSIS — Z7901 Long term (current) use of anticoagulants: Secondary | ICD-10-CM | POA: Diagnosis not present

## 2012-11-25 DIAGNOSIS — S79919A Unspecified injury of unspecified hip, initial encounter: Secondary | ICD-10-CM | POA: Diagnosis not present

## 2012-11-25 DIAGNOSIS — I10 Essential (primary) hypertension: Secondary | ICD-10-CM | POA: Diagnosis present

## 2012-11-25 DIAGNOSIS — F341 Dysthymic disorder: Secondary | ICD-10-CM | POA: Diagnosis not present

## 2012-11-25 DIAGNOSIS — Z96649 Presence of unspecified artificial hip joint: Secondary | ICD-10-CM | POA: Diagnosis not present

## 2012-11-25 DIAGNOSIS — M25559 Pain in unspecified hip: Secondary | ICD-10-CM | POA: Diagnosis not present

## 2012-11-25 DIAGNOSIS — Z9660 Presence of unspecified orthopedic joint implant: Secondary | ICD-10-CM | POA: Diagnosis not present

## 2012-11-25 DIAGNOSIS — E785 Hyperlipidemia, unspecified: Secondary | ICD-10-CM | POA: Diagnosis present

## 2012-11-25 DIAGNOSIS — S0990XA Unspecified injury of head, initial encounter: Secondary | ICD-10-CM | POA: Diagnosis not present

## 2012-11-25 DIAGNOSIS — H409 Unspecified glaucoma: Secondary | ICD-10-CM | POA: Diagnosis present

## 2012-11-25 DIAGNOSIS — K5900 Constipation, unspecified: Secondary | ICD-10-CM | POA: Diagnosis not present

## 2012-11-25 DIAGNOSIS — Z8673 Personal history of transient ischemic attack (TIA), and cerebral infarction without residual deficits: Secondary | ICD-10-CM

## 2012-11-25 DIAGNOSIS — Q2111 Secundum atrial septal defect: Secondary | ICD-10-CM

## 2012-11-25 DIAGNOSIS — Q211 Atrial septal defect: Secondary | ICD-10-CM | POA: Diagnosis not present

## 2012-11-25 DIAGNOSIS — C50912 Malignant neoplasm of unspecified site of left female breast: Secondary | ICD-10-CM

## 2012-11-25 DIAGNOSIS — Y92009 Unspecified place in unspecified non-institutional (private) residence as the place of occurrence of the external cause: Secondary | ICD-10-CM

## 2012-11-25 DIAGNOSIS — F329 Major depressive disorder, single episode, unspecified: Secondary | ICD-10-CM | POA: Diagnosis present

## 2012-11-25 DIAGNOSIS — W19XXXA Unspecified fall, initial encounter: Secondary | ICD-10-CM

## 2012-11-25 DIAGNOSIS — S72334A Nondisplaced oblique fracture of shaft of right femur, initial encounter for closed fracture: Secondary | ICD-10-CM

## 2012-11-25 DIAGNOSIS — S72409A Unspecified fracture of lower end of unspecified femur, initial encounter for closed fracture: Secondary | ICD-10-CM | POA: Diagnosis not present

## 2012-11-25 DIAGNOSIS — Z96659 Presence of unspecified artificial knee joint: Secondary | ICD-10-CM | POA: Diagnosis not present

## 2012-11-25 DIAGNOSIS — N390 Urinary tract infection, site not specified: Secondary | ICD-10-CM | POA: Diagnosis present

## 2012-11-25 DIAGNOSIS — S72009A Fracture of unspecified part of neck of unspecified femur, initial encounter for closed fracture: Secondary | ICD-10-CM | POA: Diagnosis not present

## 2012-11-25 DIAGNOSIS — S72301A Unspecified fracture of shaft of right femur, initial encounter for closed fracture: Secondary | ICD-10-CM

## 2012-11-25 DIAGNOSIS — T84049A Periprosthetic fracture around unspecified internal prosthetic joint, initial encounter: Secondary | ICD-10-CM | POA: Diagnosis present

## 2012-11-25 DIAGNOSIS — K219 Gastro-esophageal reflux disease without esophagitis: Secondary | ICD-10-CM | POA: Diagnosis present

## 2012-11-25 DIAGNOSIS — D62 Acute posthemorrhagic anemia: Secondary | ICD-10-CM | POA: Diagnosis not present

## 2012-11-25 DIAGNOSIS — C50919 Malignant neoplasm of unspecified site of unspecified female breast: Secondary | ICD-10-CM | POA: Diagnosis not present

## 2012-11-25 DIAGNOSIS — F3289 Other specified depressive episodes: Secondary | ICD-10-CM | POA: Diagnosis present

## 2012-11-25 DIAGNOSIS — S72309A Unspecified fracture of shaft of unspecified femur, initial encounter for closed fracture: Secondary | ICD-10-CM | POA: Diagnosis not present

## 2012-11-25 DIAGNOSIS — W19XXXD Unspecified fall, subsequent encounter: Secondary | ICD-10-CM

## 2012-11-25 LAB — CBC WITH DIFFERENTIAL/PLATELET
Eosinophils Relative: 2 % (ref 0–5)
Hemoglobin: 14.4 g/dL (ref 12.0–15.0)
MCHC: 34.7 g/dL (ref 30.0–36.0)
Monocytes Absolute: 0.3 10*3/uL (ref 0.1–1.0)
Monocytes Relative: 5 % (ref 3–12)
Neutro Abs: 5.3 10*3/uL (ref 1.7–7.7)
Neutrophils Relative %: 75 % (ref 43–77)
Platelets: 174 10*3/uL (ref 150–400)
RDW: 14.8 % (ref 11.5–15.5)

## 2012-11-25 LAB — URINALYSIS, ROUTINE W REFLEX MICROSCOPIC
Ketones, ur: NEGATIVE mg/dL
Leukocytes, UA: NEGATIVE
Nitrite: NEGATIVE
Specific Gravity, Urine: 1.014 (ref 1.005–1.030)
pH: 7 (ref 5.0–8.0)

## 2012-11-25 LAB — TYPE AND SCREEN

## 2012-11-25 LAB — BASIC METABOLIC PANEL
BUN: 21 mg/dL (ref 6–23)
CO2: 22 mEq/L (ref 19–32)
Calcium: 9.4 mg/dL (ref 8.4–10.5)
Creatinine, Ser: 0.82 mg/dL (ref 0.50–1.10)
Glucose, Bld: 93 mg/dL (ref 70–99)

## 2012-11-25 LAB — PROTIME-INR: INR: 1.55 — ABNORMAL HIGH (ref 0.00–1.49)

## 2012-11-25 MED ORDER — FENTANYL CITRATE 0.05 MG/ML IJ SOLN
50.0000 ug | Freq: Once | INTRAMUSCULAR | Status: AC
Start: 2012-11-25 — End: 2012-11-25
  Administered 2012-11-25: 50 ug via INTRAVENOUS
  Filled 2012-11-25: qty 2

## 2012-11-25 NOTE — ED Provider Notes (Addendum)
History     CSN: [id number]  Arrival date & time 11/25/12  2004   First MD Initiated Contact with Patient 11/25/12 2112      Chief Complaint  Patient presents with  . Fall    (Consider location/radiation/quality/duration/timing/severity/associated sxs/prior treatment) Patient is a 79 y.o. female presenting with fall. The history is provided by the patient.  Fall  She was walking down stairs when she felt something pop in her right hip and she fell. She is complaining of pain in the right hip since the fall. She is unable to ambulate. She denies other injury. She is on warfarin for TIAs. At rest, pain is rated at 4/10. It is 9/10 if she tries to move or stand. She denies head or back or neck injury. She is status post bilateral hip and knee replacement.  Past Medical History  Diagnosis Date  . GERD (gastroesophageal reflux disease)   . Glaucoma   . Cancer   . Hypertension   . Depression     Past Surgical History  Procedure Laterality Date  . Total knee arthroplasty Bilateral   . Total hip arthroplasty Bilateral   . Mastectomy  1996    left breast lumpectomy   . Incision and drainage hip  03/31/2012    Procedure: IRRIGATION AND DEBRIDEMENT HIP WITH POLY EXCHANGE;  Surgeon: Raymon Mutton, MD;  Location: [facility] OR;  Service: Orthopedics;  Laterality: Left;    Family History  Problem Relation Age of Onset  . Cancer Mother     History  Substance Use Topics  . Smoking status: Never Smoker   . Smokeless tobacco: Never Used  . Alcohol Use: No    OB History   Grav Para Term Preterm Abortions TAB SAB Ect Mult Living                  Review of Systems  All other systems reviewed and are negative.    Allergies  Dilaudid; Morphine and related; Sulfa antibiotics; Keflex; and Levaquin  Home Medications   Current Outpatient Rx  Name  Route  Sig  Dispense  Refill  . ALPRAZolam (XANAX) 0.25 MG tablet   Oral   Take 4 tablets (1 mg total) by mouth every 4 (four) hours  as needed for anxiety.   40 tablet   0   . amLODipine (NORVASC) 5 MG tablet   Oral   Take 5 mg by mouth daily.         Marland Kitchen atorvastatin (LIPITOR) 10 MG tablet   Oral   Take 10 mg by mouth daily.           Marland Kitchen buPROPion (WELLBUTRIN XL) 300 MG 24 hr tablet   Oral   Take 300 mg by mouth daily.         . calcium carbonate 200 MG capsule   Oral   Take 600 mg by mouth 2 (two) times daily with a meal.         . ciprofloxacin (CIPRO) 250 MG tablet   Oral   Take 250 mg by mouth 2 (two) times daily.         Marland Kitchen latanoprost (XALATAN) 0.005 % ophthalmic solution   Both Eyes   Place 1 drop into both eyes at bedtime.         . methocarbamol (ROBAXIN) 500 MG tablet   Oral   Take 1-2 tablets (500-1,000 mg total) by mouth every 6 (six) hours as needed.   60 tablet  0   . rOPINIRole (REQUIP) 1 MG tablet   Oral   Take 1 mg by mouth at bedtime as needed. For restless legs         . sertraline (ZOLOFT) 100 MG tablet   Oral   Take 150 mg by mouth daily.         Marland Kitchen warfarin (COUMADIN) 2 MG tablet   Oral   Take 2 mg by mouth every other day.         . warfarin (COUMADIN) 3 MG tablet   Oral   Take 3 mg by mouth every other day.           BP 135/73  Pulse 85  Temp(Src) 98.2 F (36.8 C) (Oral)  Resp 15  SpO2 95%  Physical Exam  Nursing note and vitals reviewed.  79 year old female, resting comfortably and in no acute distress. Vital signs are significant for mild hypertension with blood pressure 145/78. Oxygen saturation is 97%, which is normal. Head is normocephalic and atraumatic. PERRLA, EOMI. Oropharynx is clear. Neck is nontender and supple without adenopathy or JVD. Back is nontender and there is no CVA tenderness. Lungs are clear without rales, wheezes, or rhonchi. Chest is nontender. Heart has regular rate and rhythm without murmur. Abdomen is soft, flat, nontender without masses or hepatosplenomegaly and peristalsis is normoactive. Extremities have no  cyanosis or edema, full range of motion is present. There is pain with passive range of motion of the right hip and there is tenderness palpation over the proximal half of the right thigh. Skin is warm and dry without rash. Neurologic: Mental status is normal, cranial nerves are intact, there are no motor or sensory deficits.  ED Course  Procedures (including critical care time)  Results for orders placed during the hospital encounter of 11/25/12  CBC WITH DIFFERENTIAL      Result Value Range   WBC 7.0  4.0 - 10.5 K/uL   RBC 4.82  3.87 - 5.11 MIL/uL   Hemoglobin 14.4  12.0 - 15.0 g/dL   HCT 40.9  81.1 - 91.4 %   MCV 86.1  78.0 - 100.0 fL   MCH 29.9  26.0 - 34.0 pg   MCHC 34.7  30.0 - 36.0 g/dL   RDW 78.2  95.6 - 21.3 %   Platelets 174  150 - 400 K/uL   Neutrophils Relative % 75  43 - 77 %   Neutro Abs 5.3  1.7 - 7.7 K/uL   Lymphocytes Relative 16  12 - 46 %   Lymphs Abs 1.2  0.7 - 4.0 K/uL   Monocytes Relative 5  3 - 12 %   Monocytes Absolute 0.3  0.1 - 1.0 K/uL   Eosinophils Relative 2  0 - 5 %   Eosinophils Absolute 0.2  0.0 - 0.7 K/uL   Basophils Relative 2 (*) 0 - 1 %   Basophils Absolute 0.1  0.0 - 0.1 K/uL  BASIC METABOLIC PANEL      Result Value Range   Sodium 139  135 - 145 mEq/L   Potassium 3.9  3.5 - 5.1 mEq/L   Chloride 103  96 - 112 mEq/L   CO2 22  19 - 32 mEq/L   Glucose, Bld 93  70 - 99 mg/dL   BUN 21  6 - 23 mg/dL   Creatinine, Ser 0.86  0.50 - 1.10 mg/dL   Calcium 9.4  8.4 - 57.8 mg/dL   GFR calc non Af Denyse Dago  68 (*) >90 mL/min   GFR calc Af Amer 79 (*) >90 mL/min  PROTIME-INR      Result Value Range   Prothrombin Time 18.1 (*) 11.6 - 15.2 seconds   INR 1.55 (*) 0.00 - 1.49  URINALYSIS, ROUTINE W REFLEX MICROSCOPIC      Result Value Range   Color, Urine YELLOW  YELLOW   APPearance CLEAR  CLEAR   Specific Gravity, Urine 1.014  1.005 - 1.030   pH 7.0  5.0 - 8.0   Glucose, UA NEGATIVE  NEGATIVE mg/dL   Hgb urine dipstick NEGATIVE  NEGATIVE   Bilirubin  Urine NEGATIVE  NEGATIVE   Ketones, ur NEGATIVE  NEGATIVE mg/dL   Protein, ur NEGATIVE  NEGATIVE mg/dL   Urobilinogen, UA 0.2  0.0 - 1.0 mg/dL   Nitrite NEGATIVE  NEGATIVE   Leukocytes, UA NEGATIVE  NEGATIVE  TYPE AND SCREEN      Result Value Range   ABO/RH(D) AB POS     Antibody Screen NEG     Sample Expiration 11/28/2012     Dg Hip Complete Left  11/25/2012   *RADIOLOGY REPORT*  Clinical Data: Larey Seat.  Left hip pain.  LEFT HIP - COMPLETE 2+ VIEW  Comparison: 09/15/2011.  Findings: The acetabular Insert is likely loose as suggested on the prior examination.  No acute fracture or femoral head dislocation.  IMPRESSION: No acute fracture.   Original Report Authenticated By: Rudie Meyer, M.D.   Dg Hip Complete Right  11/25/2012   *RADIOLOGY REPORT*  Clinical Data: Larey Seat.  Bilateral hip pain.  RIGHT HIP - COMPLETE 2+ VIEW  Comparison: 09/25/2011.  Findings: Bilateral hip prosthesis appears stable.  The acetabular Insert is likely loose as suggested on the prior study.  No femoral head dislocation. There is a lateral cortical fracture near the tip of the right prosthesis.  Stable areas of heterotopic ossification.  IMPRESSION: Lateral cortical fracture near the tip of the right femoral prosthesis.   Original Report Authenticated By: Rudie Meyer, M.D.   Dg Femur Right  11/25/2012   *RADIOLOGY REPORT*  Clinical Data: Larey Seat.  Right hip pain.  RIGHT FEMUR - 2 VIEW  Comparison: Earlier right hip films.  Findings:  Again demonstrated is a oblique coursing fracture in the distal tip of the right femoral prosthesis without displacement. The distal femoral prosthesis is intact.  No distal femur fracture.  IMPRESSION: Nondisplaced oblique coursing lateral cortical fracture of the proximal femur near the tip of the prosthesis.   Original Report Authenticated By: Rudie Meyer, M.D.   Ct Head Wo Contrast  11/25/2012   *RADIOLOGY REPORT*  Clinical Data: Larey Seat.  Hit head.  CT HEAD WITHOUT CONTRAST  Technique:   Contiguous axial images were obtained from the base of the skull through the vertex without contrast.  Comparison: 11/26/2008.  Findings: Stable area of encephalomalacia in the left high parietal area likely due to prior infarction.  The ventricles are normal and stable.  No extra-axial fluid collections are identified.  No CT findings for acute hemispheric infarction and/or intracranial hemorrhage.  No mass lesions.  The brainstem and cerebellum grossly normal and stable.  The bony structures are intact.  No acute skull fracture.  The paranasal sinuses and mastoid air cells are clear.  The globes are intact.  IMPRESSION:  1.  Remote left parietal infarct. 2.  No acute intracranial findings for acute skull fracture.   Original Report Authenticated By: Rudie Meyer, M.D.   Images viewed by me.   Date:  11/26/2012  Rate: 85  Rhythm: normal sinus rhythm  QRS Axis: left  Intervals: normal  ST/T Wave abnormalities: normal  Conduction Disutrbances:none  Narrative Interpretation: Old inferior and anterolateral myocardial infarctions with poor R-wave progression across the precordium. When compared with ECG of 04/07/2012, no significant changes are seen  Old EKG Reviewed: unchanged    1. Fall at home, initial encounter   2. Nondisplaced oblique fracture of shaft of femur, right, closed, initial encounter       MDM  Fall with injury to the right hip. X-ray shows a fracture in near the distal end of the stem of the right hip prosthesis. She will be sent back for the femur x-rays to make sure there is no other fracture and CT of the head will need to be obtained given that she is on anticoagulants. Orthopedic consultation will be needed to decide on avenue of treatment.  Femur x-ray shows no additional fracture. Case is discussed with Dr. Dion Saucier who feels that it is likely that she can be treated without surgery. However, she is unable to bear any weight whatsoever and is not safe to go home. Case is  discussed with Dr. Eben Burow of [facility] who agrees to admit the patient under observation status.      Dione Booze, MD 11/26/12 Marlyne Beards  Dione Booze, MD 11/26/12 [phone]

## 2012-11-25 NOTE — ED Notes (Signed)
BIB [facility]. Patient fallen at home while walking down stairs. Fall to right hip. Patient unable to move right leg. Did not hit head. NO LOC Hx bilateral hip and knee replacements. Right hip 79 years old? Previous Hx of Left hip dislocation. Patient son had spoken with Orthopedic surgeon Jonell Cluck MD) answering service [phone]). PA aware. Currently being treated for UTI with Cipro

## 2012-11-26 ENCOUNTER — Inpatient Hospital Stay (HOSPITAL_COMMUNITY): Payer: Medicare Other

## 2012-11-26 DIAGNOSIS — S72309A Unspecified fracture of shaft of unspecified femur, initial encounter for closed fracture: Secondary | ICD-10-CM | POA: Diagnosis not present

## 2012-11-26 DIAGNOSIS — S72301A Unspecified fracture of shaft of right femur, initial encounter for closed fracture: Secondary | ICD-10-CM

## 2012-11-26 DIAGNOSIS — W19XXXA Unspecified fall, initial encounter: Secondary | ICD-10-CM

## 2012-11-26 DIAGNOSIS — Y92009 Unspecified place in unspecified non-institutional (private) residence as the place of occurrence of the external cause: Secondary | ICD-10-CM | POA: Diagnosis not present

## 2012-11-26 DIAGNOSIS — T84049A Periprosthetic fracture around unspecified internal prosthetic joint, initial encounter: Secondary | ICD-10-CM | POA: Diagnosis not present

## 2012-11-26 DIAGNOSIS — N390 Urinary tract infection, site not specified: Secondary | ICD-10-CM | POA: Diagnosis present

## 2012-11-26 LAB — SURGICAL PCR SCREEN
MRSA, PCR: POSITIVE — AB
Staphylococcus aureus: POSITIVE — AB

## 2012-11-26 MED ORDER — ROPINIROLE HCL 1 MG PO TABS
1.0000 mg | ORAL_TABLET | Freq: Every evening | ORAL | Status: DC | PRN
  Administered 2012-11-27: 1 mg via ORAL
  Filled 2012-11-26 (×2): qty 1

## 2012-11-26 MED ORDER — ONDANSETRON HCL 4 MG PO TABS
4.0000 mg | ORAL_TABLET | Freq: Four times a day (QID) | ORAL | Status: DC | PRN
  Administered 2012-11-28: 4 mg via ORAL
  Filled 2012-11-26: qty 1

## 2012-11-26 MED ORDER — LATANOPROST 0.005 % OP SOLN
1.0000 [drp] | Freq: Every day | OPHTHALMIC | Status: DC
  Administered 2012-11-26 – 2012-12-01 (×7): 1 [drp] via OPHTHALMIC
  Filled 2012-11-26: qty 2.5

## 2012-11-26 MED ORDER — FENTANYL CITRATE 0.05 MG/ML IJ SOLN
50.0000 ug | INTRAMUSCULAR | Status: DC | PRN
  Filled 2012-11-26: qty 2

## 2012-11-26 MED ORDER — METOCLOPRAMIDE HCL 10 MG PO TABS: 5.0000 mg | ORAL_TABLET | Freq: Three times a day (TID) | ORAL | Status: DC | PRN

## 2012-11-26 MED ORDER — ALPRAZOLAM 0.5 MG PO TABS: 1.0000 mg | ORAL_TABLET | ORAL | Status: DC | PRN

## 2012-11-26 MED ORDER — BOOST PLUS PO LIQD
237.0000 mL | Freq: Two times a day (BID) | ORAL | Status: DC
  Administered 2012-11-27 – 2012-12-02 (×10): 237 mL via ORAL
  Filled 2012-11-26 (×15): qty 237

## 2012-11-26 MED ORDER — HYDROCODONE-ACETAMINOPHEN 5-325 MG PO TABS
1.0000 | ORAL_TABLET | Freq: Four times a day (QID) | ORAL | Status: DC | PRN
  Administered 2012-11-27 – 2012-12-02 (×10): 2 via ORAL
  Filled 2012-11-26 (×11): qty 2

## 2012-11-26 MED ORDER — METHOCARBAMOL 500 MG PO TABS
500.0000 mg | ORAL_TABLET | Freq: Four times a day (QID) | ORAL | Status: DC | PRN
  Administered 2012-11-27 – 2012-12-01 (×5): 500 mg via ORAL
  Filled 2012-11-26 (×6): qty 1

## 2012-11-26 MED ORDER — ACETAMINOPHEN 650 MG RE SUPP: 650.0000 mg | Freq: Four times a day (QID) | RECTAL | Status: DC | PRN

## 2012-11-26 MED ORDER — FENTANYL CITRATE 0.05 MG/ML IJ SOLN
50.0000 ug | INTRAMUSCULAR | Status: DC | PRN
  Administered 2012-11-26 – 2012-11-30 (×7): 50 ug via INTRAVENOUS
  Administered 2012-12-01: 25 ug via INTRAVENOUS
  Administered 2012-12-01 – 2012-12-02 (×2): 50 ug via INTRAVENOUS
  Filled 2012-11-26 (×9): qty 2

## 2012-11-26 MED ORDER — SERTRALINE HCL 50 MG PO TABS
150.0000 mg | ORAL_TABLET | Freq: Every day | ORAL | Status: DC
  Administered 2012-11-26 – 2012-12-02 (×7): 150 mg via ORAL
  Filled 2012-11-26 (×7): qty 1

## 2012-11-26 MED ORDER — BISACODYL 5 MG PO TBEC: 5.0000 mg | DELAYED_RELEASE_TABLET | Freq: Every day | ORAL | Status: DC | PRN

## 2012-11-26 MED ORDER — ACETAMINOPHEN 325 MG PO TABS: 650.0000 mg | ORAL_TABLET | Freq: Four times a day (QID) | ORAL | Status: DC | PRN

## 2012-11-26 MED ORDER — WARFARIN - PHARMACIST DOSING INPATIENT: Freq: Every day | Status: DC

## 2012-11-26 MED ORDER — WARFARIN SODIUM 5 MG PO TABS
5.0000 mg | ORAL_TABLET | Freq: Once | ORAL | Status: AC
  Administered 2012-11-26: 5 mg via ORAL
  Filled 2012-11-26: qty 1

## 2012-11-26 MED ORDER — SODIUM CHLORIDE 0.9 % IV SOLN
INTRAVENOUS | Status: AC
  Administered 2012-11-26: 1000 mL via INTRAVENOUS

## 2012-11-26 MED ORDER — FLEET ENEMA 7-19 GM/118ML RE ENEM: 1.0000 | ENEMA | Freq: Once | RECTAL | Status: AC | PRN

## 2012-11-26 MED ORDER — MORPHINE SULFATE 2 MG/ML IJ SOLN: 0.5000 mg | INTRAMUSCULAR | Status: DC | PRN

## 2012-11-26 MED ORDER — METHOCARBAMOL 100 MG/ML IJ SOLN: 500.0000 mg | Freq: Four times a day (QID) | INTRAVENOUS | Status: DC | PRN

## 2012-11-26 MED ORDER — DEXTROSE 5 % IV SOLN: 500.0000 mg | Freq: Four times a day (QID) | INTRAVENOUS | Status: DC | PRN

## 2012-11-26 MED ORDER — MENTHOL 3 MG MT LOZG: 1.0000 | LOZENGE | OROMUCOSAL | Status: DC | PRN

## 2012-11-26 MED ORDER — PHENOL 1.4 % MT LIQD
1.0000 | OROMUCOSAL | Status: DC | PRN
  Administered 2012-12-01: 1 via OROMUCOSAL
  Filled 2012-11-26: qty 177

## 2012-11-26 MED ORDER — ONDANSETRON HCL 4 MG/2ML IJ SOLN: 4.0000 mg | Freq: Three times a day (TID) | INTRAMUSCULAR | Status: DC | PRN

## 2012-11-26 MED ORDER — ZOLPIDEM TARTRATE 5 MG PO TABS: 5.0000 mg | ORAL_TABLET | Freq: Every evening | ORAL | Status: DC | PRN

## 2012-11-26 MED ORDER — BUPROPION HCL ER (XL) 300 MG PO TB24
300.0000 mg | ORAL_TABLET | Freq: Every day | ORAL | Status: DC
  Administered 2012-11-26 – 2012-12-02 (×7): 300 mg via ORAL
  Filled 2012-11-26 (×7): qty 1

## 2012-11-26 MED ORDER — MUPIROCIN 2 % EX OINT
1.0000 "application " | TOPICAL_OINTMENT | Freq: Two times a day (BID) | CUTANEOUS | Status: AC
  Administered 2012-11-26 – 2012-11-30 (×9): 1 via NASAL
  Filled 2012-11-26 (×2): qty 22

## 2012-11-26 MED ORDER — CALCIUM CARBONATE 1250 (500 CA) MG PO TABS
600.0000 mg | ORAL_TABLET | Freq: Two times a day (BID) | ORAL | Status: DC
  Filled 2012-11-26: qty 0.5

## 2012-11-26 MED ORDER — ALUM & MAG HYDROXIDE-SIMETH 200-200-20 MG/5ML PO SUSP: 30.0000 mL | ORAL | Status: DC | PRN

## 2012-11-26 MED ORDER — CEFTRIAXONE SODIUM 1 G IJ SOLR
1.0000 g | INTRAMUSCULAR | Status: AC
Start: 2012-11-26 — End: 2012-11-26
  Administered 2012-11-26 (×2): 1 g via INTRAVENOUS
  Filled 2012-11-26 (×2): qty 10

## 2012-11-26 MED ORDER — CALCIUM CARBONATE 1250 (500 CA) MG PO TABS
1250.0000 mg | ORAL_TABLET | Freq: Two times a day (BID) | ORAL | Status: DC
  Administered 2012-11-26 – 2012-12-02 (×13): 1250 mg via ORAL
  Filled 2012-11-26 (×15): qty 1

## 2012-11-26 MED ORDER — METHOCARBAMOL 500 MG PO TABS
500.0000 mg | ORAL_TABLET | Freq: Four times a day (QID) | ORAL | Status: DC | PRN
  Administered 2012-11-26: 500 mg via ORAL
  Filled 2012-11-26: qty 1

## 2012-11-26 MED ORDER — CHLORHEXIDINE GLUCONATE CLOTH 2 % EX PADS
6.0000 | MEDICATED_PAD | Freq: Every day | CUTANEOUS | Status: DC
  Administered 2012-11-27 – 2012-11-30 (×4): 6 via TOPICAL

## 2012-11-26 MED ORDER — SODIUM CHLORIDE 0.9 % IV SOLN: INTRAVENOUS | Status: DC

## 2012-11-26 MED ORDER — DOCUSATE SODIUM 100 MG PO CAPS
100.0000 mg | ORAL_CAPSULE | Freq: Two times a day (BID) | ORAL | Status: DC
  Administered 2012-11-26 – 2012-12-02 (×11): 100 mg via ORAL
  Filled 2012-11-26 (×13): qty 1

## 2012-11-26 MED ORDER — ATORVASTATIN CALCIUM 10 MG PO TABS
10.0000 mg | ORAL_TABLET | Freq: Every day | ORAL | Status: DC
  Administered 2012-11-26 – 2012-12-02 (×7): 10 mg via ORAL
  Filled 2012-11-26 (×7): qty 1

## 2012-11-26 MED ORDER — ONDANSETRON HCL 4 MG/2ML IJ SOLN: 4.0000 mg | Freq: Four times a day (QID) | INTRAMUSCULAR | Status: DC | PRN

## 2012-11-26 MED ORDER — METOCLOPRAMIDE HCL 5 MG/ML IJ SOLN: 5.0000 mg | Freq: Three times a day (TID) | INTRAMUSCULAR | Status: DC | PRN

## 2012-11-26 MED ORDER — AMLODIPINE BESYLATE 5 MG PO TABS
5.0000 mg | ORAL_TABLET | Freq: Every day | ORAL | Status: DC
  Administered 2012-11-26 – 2012-12-02 (×6): 5 mg via ORAL
  Filled 2012-11-26 (×7): qty 1

## 2012-11-26 NOTE — Progress Notes (Signed)
Patient admitted by Dr. Eben Burow this morning, H&P reviewed, patient examined, agree with assessment and plan 1) nondisplaced femoral shaft fracture - Orthopedics consulted, Dr [name], Will follow recommendations, surgical versus nonsurgical - PT, OT consultation, may need rehabilitation  2) UTI - Patient was recently diagnosed 2 days ago with UTI by her primary care physician, she has only done 3 doses of ciprofloxacin (one half days), will continue Rocephin for 2 more days to finish the course  3) history of recurrent TIAs, ASD: Continue Coumadin If no surgery is planned, will place back on Coumadin today   RAI,RIPUDEEP M.D. [facility] 11/26/2012, 1:57 PM  Pager: [phone]

## 2012-11-26 NOTE — Consult Note (Signed)
NAME: Renee Adams MRN:   [id number] DOB:   May 14, 1934     HISTORY AND PHYSICAL  CHIEF COMPLAINT:  Right hip pain  HISTORY:   Renee Gruenhagen Wrightis a 79 y.o. female  with 79 y/o woman with PMH significant for depression, HTN and multiple surgeries in the past for hip and knee joint replecements Presents to the ED with a fall and right hip pain.  Patient reports that she felt something pop in her hip while walking downstairs outside her house and fell. She reports having right hip pain since the fall. Denies any head trauma or loss of conscious. She is unable to ambulate. At rest, pain is rated at 5/10. It is 9/10 if she tries to move or stand. Xray in the ER suggestive of femur fracture. Dr Preston Fleeting talked to Dr Dion Saucier from Ortho who suggested outpatient follow up but patient was unable to ambulate and hence it was decided to admit the patient. Patient requests to be seen by Dr [name] who is her regular orthopedics.      PAST MEDICAL HISTORY:   Past Medical History  Diagnosis Date  . GERD (gastroesophageal reflux disease)   . Glaucoma   . Cancer   . Hypertension   . Depression     PAST SURGICAL HISTORY:   Past Surgical History  Procedure Laterality Date  . Total knee arthroplasty Bilateral   . Total hip arthroplasty Bilateral   . Mastectomy  1996    left breast lumpectomy   . Incision and drainage hip  03/31/2012    Procedure: IRRIGATION AND DEBRIDEMENT HIP WITH POLY EXCHANGE;  Surgeon: Raymon Mutton, MD;  Location: [facility] OR;  Service: Orthopedics;  Laterality: Left;    MEDICATIONS:   Medications Prior to Admission  Medication Sig Dispense Refill  . ALPRAZolam (XANAX) 0.25 MG tablet Take 4 tablets (1 mg total) by mouth every 4 (four) hours as needed for anxiety.  40 tablet  0  . amLODipine (NORVASC) 5 MG tablet Take 5 mg by mouth daily.      Marland Kitchen atorvastatin (LIPITOR) 10 MG tablet Take 10 mg by mouth daily.        Marland Kitchen buPROPion (WELLBUTRIN XL) 300 MG 24 hr tablet Take 300 mg by mouth  daily.      . calcium carbonate 200 MG capsule Take 600 mg by mouth 2 (two) times daily with a meal.      . ciprofloxacin (CIPRO) 250 MG tablet Take 250 mg by mouth 2 (two) times daily.      Marland Kitchen latanoprost (XALATAN) 0.005 % ophthalmic solution Place 1 drop into both eyes at bedtime.      . methocarbamol (ROBAXIN) 500 MG tablet Take 1-2 tablets (500-1,000 mg total) by mouth every 6 (six) hours as needed.  60 tablet  0  . rOPINIRole (REQUIP) 1 MG tablet Take 1 mg by mouth at bedtime as needed. For restless legs      . sertraline (ZOLOFT) 100 MG tablet Take 150 mg by mouth daily.      Marland Kitchen warfarin (COUMADIN) 2 MG tablet Take 2 mg by mouth every other day.      . warfarin (COUMADIN) 3 MG tablet Take 3 mg by mouth every other day.        ALLERGIES:   Allergies  Allergen Reactions  . Dilaudid (Hydromorphone Hcl) Shortness Of Breath  . Morphine And Related Shortness Of Breath  . Sulfa Antibiotics Other (See Comments)    "Made me drunk";  wobbly  . Keflex (Cephalexin) Nausea Only  . Levaquin (Levofloxacin) Hives and Rash    REVIEW OF SYSTEMS:   Negative except HPI  FAMILY HISTORY:   Family History  Problem Relation Age of Onset  . Cancer Mother     SOCIAL HISTORY:   reports that she has never smoked. She has never used smokeless tobacco. She reports that she does not drink alcohol or use illicit drugs.  PHYSICAL EXAM:  General appearance: alert, cooperative and no distress Resp: clear to auscultation bilaterally Cardio: regular rate and rhythm, S1, S2 normal, no murmur, click, rub or gallop GI: soft, non-tender; bowel sounds normal; no masses,  no organomegaly Extremities: extremities normal, atraumatic, no cyanosis or edema and Homans sign is negative, no sign of DVT Pulses: 2+ and symmetric Skin: Skin color, texture, turgor normal. No rashes or lesions Neurologic: Alert and oriented X 3, normal strength and tone. Normal symmetric reflexes. Normal coordination and gait    LABORATORY  STUDIES:  Recent Labs  11/25/12 2040  WBC 7.0  HGB 14.4  HCT 41.5  PLT 174     Recent Labs  11/25/12 2040  NA 139  K 3.9  CL 103  CO2 22  GLUCOSE 93  BUN 21  CREATININE 0.82  CALCIUM 9.4    STUDIES/RESULTS:  Dg Hip Complete Left  11/25/2012   *RADIOLOGY REPORT*  Clinical Data: Larey Seat.  Left hip pain.  LEFT HIP - COMPLETE 2+ VIEW  Comparison: 09/15/2011.  Findings: The acetabular Insert is likely loose as suggested on the prior examination.  No acute fracture or femoral head dislocation.  IMPRESSION: No acute fracture.   Original Report Authenticated By: Rudie Meyer, M.D.   Dg Hip Complete Right  11/25/2012   *RADIOLOGY REPORT*  Clinical Data: Larey Seat.  Bilateral hip pain.  RIGHT HIP - COMPLETE 2+ VIEW  Comparison: 09/25/2011.  Findings: Bilateral hip prosthesis appears stable.  The acetabular Insert is likely loose as suggested on the prior study.  No femoral head dislocation. There is a lateral cortical fracture near the tip of the right prosthesis.  Stable areas of heterotopic ossification.  IMPRESSION: Lateral cortical fracture near the tip of the right femoral prosthesis.   Original Report Authenticated By: Rudie Meyer, M.D.   Dg Femur Right  11/25/2012   *RADIOLOGY REPORT*  Clinical Data: Larey Seat.  Right hip pain.  RIGHT FEMUR - 2 VIEW  Comparison: Earlier right hip films.  Findings:  Again demonstrated is a oblique coursing fracture in the distal tip of the right femoral prosthesis without displacement. The distal femoral prosthesis is intact.  No distal femur fracture.  IMPRESSION: Nondisplaced oblique coursing lateral cortical fracture of the proximal femur near the tip of the prosthesis.   Original Report Authenticated By: Rudie Meyer, M.D.   Ct Head Wo Contrast  11/25/2012   *RADIOLOGY REPORT*  Clinical Data: Larey Seat.  Hit head.  CT HEAD WITHOUT CONTRAST  Technique:  Contiguous axial images were obtained from the base of the skull through the vertex without contrast.  Comparison:  11/26/2008.  Findings: Stable area of encephalomalacia in the left high parietal area likely due to prior infarction.  The ventricles are normal and stable.  No extra-axial fluid collections are identified.  No CT findings for acute hemispheric infarction and/or intracranial hemorrhage.  No mass lesions.  The brainstem and cerebellum grossly normal and stable.  The bony structures are intact.  No acute skull fracture.  The paranasal sinuses and mastoid air cells are clear.  The  globes are intact.  IMPRESSION:  1.  Remote left parietal infarct. 2.  No acute intracranial findings for acute skull fracture.   Original Report Authenticated By: Rudie Meyer, M.D.    ASSESSMENT: right hip Nondisplaced oblique coursing lateral cortical fracture of the proximal femur near the tip of the prosthesis        Principal Problem:   Fracture of femoral shaft, right, closed Active Problems:   Essential hypertension, benign   Fall   UTI (urinary tract infection)    PLAN: 1. Pain control  2. Touchdown weightbearing RLE  3. Home Heath PT and RN  4. D/C tomorrow   5. Follow up with Dr. [name]  12/02/12   Magnum Lunde 11/26/2012. 1:46 PM

## 2012-11-26 NOTE — H&P (Signed)
History and Physical  Renee Adams ZOX:[id number] DOB: 06/07/1934 DOA: 11/25/2012  Referring physician: EDP, Dr. Preston Fleeting PCP: Benita Stabile, MD   Chief Complaint: fall   HPI: 78 y/o woman with PMH significant for depression, HTN and multiple surgeries in the past for hip and knee joint replecements Presents to the ED with a fall and right hip pain.   Patient reports that she felt something pop in her hip while walking downstairs outside her house and fell. She reports having right hip pain since the fall. Denies any head trauma or loss of conscious. She is unable to ambulate. At rest, pain is rated at 5/10. It is 9/10 if she tries to move or stand.  Xray in the ER suggestive of femur fracture. Dr Preston Fleeting talked to Dr Dion Saucier from Ortho who suggested outpatient follow up but patient was unable to ambulate and hence it was decided to admit the patient. Patient requests to be seen by Dr [name] who is her regular orthopedics.   Review of Systems:   All other systems reviewed and are negative.   Past Medical History  Diagnosis Date  . GERD (gastroesophageal reflux disease)   . Glaucoma   . Cancer   . Hypertension   . Depression     Past Surgical History  Procedure Laterality Date  . Total knee arthroplasty Bilateral   . Total hip arthroplasty Bilateral   . Mastectomy  1996    left breast lumpectomy   . Incision and drainage hip  03/31/2012    Procedure: IRRIGATION AND DEBRIDEMENT HIP WITH POLY EXCHANGE;  Surgeon: Raymon Mutton, MD;  Location: [facility] OR;  Service: Orthopedics;  Laterality: Left;    Social History:  reports that she has never smoked. She has never used smokeless tobacco. She reports that she does not drink alcohol or use illicit drugs.  Allergies  Allergen Reactions  . Dilaudid (Hydromorphone Hcl) Shortness Of Breath  . Morphine And Related Shortness Of Breath  . Sulfa Antibiotics Other (See Comments)    "Made me drunk";  wobbly  . Keflex (Cephalexin) Nausea Only   . Levaquin (Levofloxacin) Hives and Rash    Family History  Problem Relation Age of Onset  . Cancer Mother      Prior to Admission medications   Medication Sig Start Date End Date Taking? Authorizing Provider  ALPRAZolam (XANAX) 0.25 MG tablet Take 4 tablets (1 mg total) by mouth every 4 (four) hours as needed for anxiety. 04/04/12  Yes [name], PA-C  amLODipine (NORVASC) 5 MG tablet Take 5 mg by mouth daily.   Yes Corky Crafts, MD  atorvastatin (LIPITOR) 10 MG tablet Take 10 mg by mouth daily.     Yes Historical Provider, MD  buPROPion (WELLBUTRIN XL) 300 MG 24 hr tablet Take 300 mg by mouth daily.   Yes Historical Provider, MD  calcium carbonate 200 MG capsule Take 600 mg by mouth 2 (two) times daily with a meal.   Yes Historical Provider, MD  ciprofloxacin (CIPRO) 250 MG tablet Take 250 mg by mouth 2 (two) times daily.   Yes Historical Provider, MD  latanoprost (XALATAN) 0.005 % ophthalmic solution Place 1 drop into both eyes at bedtime.   Yes Historical Provider, MD  methocarbamol (ROBAXIN) 500 MG tablet Take 1-2 tablets (500-1,000 mg total) by mouth every 6 (six) hours as needed. 04/04/12  Yes [name], PA-C  rOPINIRole (REQUIP) 1 MG tablet Take 1 mg by mouth at bedtime as needed. For restless legs  Yes Historical Provider, MD  sertraline (ZOLOFT) 100 MG tablet Take 150 mg by mouth daily.   Yes Historical Provider, MD  warfarin (COUMADIN) 2 MG tablet Take 2 mg by mouth every other day.   Yes Historical Provider, MD  warfarin (COUMADIN) 3 MG tablet Take 3 mg by mouth every other day.   Yes Historical Provider, MD   Physical Exam: Filed Vitals:   11/25/12 2330 11/26/12 0000 11/26/12 0027 11/26/12 0030  BP: 133/67 124/61 115/72 124/63  Pulse: 82 82 79 82  Temp:      TempSrc:      Resp: 19 11 11 20   SpO2: 94% 92% 93% 94%   Constitutional; Alert, moderate distress from hip pain,  HEENT normocephalic and atraumatic. PERRLA, EOMI. Oropharynx is clear.  Neck:    supple without adenopathy or JVD.  Lungs:   clear to auscultation b/l without rales, wheezes, or rhonchi.  Chest is nontender.  Heart:  Normal rate and rhythm without murmur.  Abdomen :soft, flat, nontender without masses or hepatosplenomegaly and peristalsis is normoactive.  Extremities : There is pain with passive range of motion of the right hip and there is tenderness to palpation over the proximal half of the right inner thigh. Right leg externally rotated Skin is warm and dry without rash.  Neurologic: Mental status is normal, cranial nerves are intact, there are no motor or sensory deficits    Wt Readings from Last 3 Encounters:  09/15/12 140 lb 12.8 oz (63.866 kg)  08/18/12 123 lb (55.792 kg)  06/02/12 135 lb (61.236 kg)    Labs on Admission:  Basic Metabolic Panel:  Recent Labs Lab 11/25/12 2040  NA 139  K 3.9  CL 103  CO2 22  GLUCOSE 93  BUN 21  CREATININE 0.82  CALCIUM 9.4     CBC:  Recent Labs Lab 11/25/12 2040  WBC 7.0  NEUTROABS 5.3  HGB 14.4  HCT 41.5  MCV 86.1  PLT 174      Radiological Exams on Admission: Dg Hip Complete Left  11/25/2012   *RADIOLOGY REPORT*  Clinical Data: Larey Seat.  Left hip pain.  LEFT HIP - COMPLETE 2+ VIEW  Comparison: 09/15/2011.  Findings: The acetabular Insert is likely loose as suggested on the prior examination.  No acute fracture or femoral head dislocation.  IMPRESSION: No acute fracture.   Original Report Authenticated By: Rudie Meyer, M.D.   Dg Hip Complete Right  11/25/2012   *RADIOLOGY REPORT*  Clinical Data: Larey Seat.  Bilateral hip pain.  RIGHT HIP - COMPLETE 2+ VIEW  Comparison: 09/25/2011.  Findings: Bilateral hip prosthesis appears stable.  The acetabular Insert is likely loose as suggested on the prior study.  No femoral head dislocation. There is a lateral cortical fracture near the tip of the right prosthesis.  Stable areas of heterotopic ossification.  IMPRESSION: Lateral cortical fracture near the tip of the  right femoral prosthesis.   Original Report Authenticated By: Rudie Meyer, M.D.   Dg Femur Right  11/25/2012   *RADIOLOGY REPORT*  Clinical Data: Larey Seat.  Right hip pain.  RIGHT FEMUR - 2 VIEW  Comparison: Earlier right hip films.  Findings:  Again demonstrated is a oblique coursing fracture in the distal tip of the right femoral prosthesis without displacement. The distal femoral prosthesis is intact.  No distal femur fracture.  IMPRESSION: Nondisplaced oblique coursing lateral cortical fracture of the proximal femur near the tip of the prosthesis.   Original Report Authenticated By: Rudie Meyer, M.D.  Ct Head Wo Contrast  11/25/2012   *RADIOLOGY REPORT*  Clinical Data: Larey Seat.  Hit head.  CT HEAD WITHOUT CONTRAST  Technique:  Contiguous axial images were obtained from the base of the skull through the vertex without contrast.  Comparison: 11/26/2008.  Findings: Stable area of encephalomalacia in the left high parietal area likely due to prior infarction.  The ventricles are normal and stable.  No extra-axial fluid collections are identified.  No CT findings for acute hemispheric infarction and/or intracranial hemorrhage.  No mass lesions.  The brainstem and cerebellum grossly normal and stable.  The bony structures are intact.  No acute skull fracture.  The paranasal sinuses and mastoid air cells are clear.  The globes are intact.  IMPRESSION:  1.  Remote left parietal infarct. 2.  No acute intracranial findings for acute skull fracture.   Original Report Authenticated By: Rudie Meyer, M.D.    EKG: Independently reviewed. Sinus rhythm, HR- 85 bpm, poor R wave progression, unchanged from old EKG from 09/13.    Principal Problem:   Fracture of femoral shaft, right, closed Active Problems:   Essential hypertension, benign   Fall   Assessment/Plan # Nondisplaced femoral shaft fracture : 2/2 fall. Ortho was consulted who recommended non- surgical management.  - Admit to med - surg bed - Pain  control with fentanyl and methocarbamol - May need SNF placement, consult PT/OT and social work for appropriate placement - Need to consult Dr. [name] in morning (not sure if Dr Dion Saucier will inform him about her)  # HTN: BP stable .  - Continue home meds.  # Depression : Stable. Continue home meds.   # HLD: Continue lipitor  #UTI: Patient on treatment day 2 of UTI. Will put her on rocephin for 2 days and then stop  # DVT: coumadin per pharmacy (patient takes coumadin for ASD and her TIA's)  Code Status:  Full  Family Communication: Patient and family updated at bedside.  Disposition Plan/Anticipated LOS: likely in 2-3 days  Time spent: 35 minutes  Lars Mage, MD  [facility] Team 5  If 7PM-7AM, please contact night-coverage at [email or web address], password [facility] 11/26/2012, 1:00 AM

## 2012-11-26 NOTE — Progress Notes (Signed)
Orthopedic Tech Progress Note Patient Details:  Renee Adams [birth year] [id number] No overheads frames at this time. Patient ID: Renee Adams, female   DOB: Oct 06, 1933, 79 y.o.   MRN: [id number]   Renee Adams 11/26/2012, 3:22 PM

## 2012-11-26 NOTE — Progress Notes (Signed)
Orthopedic Tech Progress Note Patient Details:  Renee Adams August 01, 1933 [id number] Applied overhead frame     Jennye Moccasin 11/26/2012, 10:01 PM

## 2012-11-26 NOTE — Evaluation (Signed)
Physical Therapy Evaluation Patient Details Name: Renee Adams MRN: [id number] DOB: 10/20/1933 Today's Date: 11/26/2012 Time: 1478-2956 PT Time Calculation (min): 46 min  PT Assessment / Plan / Recommendation Clinical Impression  Renee Adams is a 79 y/o female with history of R THA.  Pt recently fell with resultant frx in R femoral shaft.  Pt reporting MD wants pt to participate in 2 session of PT. If pt able to tolerate activity  MD will forgo surgery.   Today pt struggled with mobility mostly due to pain.  Pt lives alone and will have assistance from her daughter and other family memebers PRN.       PT Assessment  Patient needs continued PT services    Follow Up Recommendations  Home health PT;Supervision/Assistance - 24 hour    Does the patient have the potential to tolerate intense rehabilitation      Barriers to Discharge None      Equipment Recommendations  Other (comment) (Pt may need to consider a wheelchair )    Recommendations for Other Services     Frequency Min 6X/week    Precautions / Restrictions Precautions Precautions: Fall Restrictions Weight Bearing Restrictions: Yes RLE Weight Bearing: Touchdown weight bearing   Pertinent Vitals/Pain 8/10 pain in R groin (pulling pain) with activity.  RN to medicate pt after session.        Mobility  Bed Mobility Bed Mobility: Supine to Sit;Sit to Supine Supine to Sit: 2: Max assist;HOB flat;With rails Sit to Supine: 2: Max assist;HOB flat Details for Bed Mobility Assistance: Assist for Bilateral LE.  Pt c/o pain in R groin with left hip Abduction.  Step by step cue for technique.  Assist to raise shoulders from bed secondary to hip pain.   Transfers Transfers: Sit to Stand;Stand to Sit Sit to Stand: 2: Max assist;From bed;With upper extremity assist Stand to Sit: 2: Max assist;To bed;With upper extremity assist Details for Transfer Assistance: First trial pt unable to achieve full standing position secondary to  pain in RLE.  Second trial PT held pt's right LE off the floor and pt was able to stand. VC for hand placement and assist for controlled descent to bed.   Ambulation/Gait Ambulation/Gait Assistance: 1: +1 Total assist Ambulation Distance (Feet): 1 Feet Assistive device: Rolling walker Ambulation/Gait Assistance Details: Pt only able to take 1 step forward.  Pt unable to maintain TDWB on RLE and reverted to NWB.   Gait Pattern: Step-to pattern Stairs: No Wheelchair Mobility Wheelchair Mobility: No    Exercises     PT Diagnosis: Difficulty walking;Acute pain  PT Problem List: Decreased range of motion;Decreased activity tolerance;Decreased strength;Decreased mobility;Pain;Decreased knowledge of precautions PT Treatment Interventions: DME instruction;Gait training;Stair training;Functional mobility training;Therapeutic activities;Therapeutic exercise;Patient/family education   PT Goals Acute Rehab PT Goals PT Goal Formulation: With patient Time For Goal Achievement: 12/10/12 Potential to Achieve Goals: Good Pt will go Supine/Side to Sit: with supervision PT Goal: Supine/Side to Sit - Progress: Goal set today Pt will go Sit to Supine/Side: with supervision PT Goal: Sit to Supine/Side - Progress: Goal set today Pt will go Sit to Stand: with supervision PT Goal: Sit to Stand - Progress: Goal set today Pt will go Stand to Sit: with supervision PT Goal: Stand to Sit - Progress: Goal set today Pt will Transfer Bed to Chair/Chair to Bed: with supervision PT Transfer Goal: Bed to Chair/Chair to Bed - Progress: Goal set today Pt will Ambulate: 1 - 15 feet;with supervision;with rolling walker;Other (comment) (maintaining  TDWB on RLE) PT Goal: Ambulate - Progress: Goal set today Pt will Go Up / Down Stairs: 1-2 stairs;with rolling walker;with min assist PT Goal: Up/Down Stairs - Progress: Goal set today  Visit Information  Last PT Received On: 11/26/12    Subjective Data  Subjective: If I  can get around I won't have to have surgery.     Prior Functioning  Home Living Lives With: Alone Available Help at Discharge: Family;Available 24 hours/day Type of Home: House Home Access: Stairs to enter [employer] of Steps: 1 Entrance Stairs-Rails: None Home Layout: One level Bathroom Shower/Tub: Walk-in shower;Door Bathroom Toilet: Handicapped height Home Adaptive Equipment: Walker - rolling;Raised toilet seat with rails Prior Function Level of Independence: Independent Able to Take Stairs?: Yes Driving: Yes Vocation: Retired Musician: Surveyor, mining Arousal/Alertness: Awake/alert Behavior During Therapy: WFL for tasks assessed/performed Overall Cognitive Status: Within Functional Limits for tasks assessed    Extremity/Trunk Assessment Right Upper Extremity Assessment RUE ROM/Strength/Tone: [facility] for tasks assessed Left Upper Extremity Assessment LUE ROM/Strength/Tone: WFL for tasks assessed Right Lower Extremity Assessment RLE ROM/Strength/Tone: Unable to fully assess;Due to pain Left Lower Extremity Assessment LLE ROM/Strength/Tone: [facility] for tasks assessed   Balance Balance Balance Assessed: Yes Static Sitting Balance Static Sitting - Balance Support: Feet supported;No upper extremity supported Static Sitting - Level of Assistance: 7: Independent  End of Session PT - End of Session Equipment Utilized During Treatment: Gait belt Activity Tolerance: Patient limited by pain Patient left: in bed;with call bell/phone within reach;with family/visitor present Nurse Communication: Mobility status;Patient requests pain meds;Weight bearing status  GP     Arriyanna Mersch 11/26/2012, 5:28 PM Cait Locust L. Chilton Si, DPT   Pager [phone]     Cell [phone]

## 2012-11-26 NOTE — Progress Notes (Signed)
ANTICOAGULATION CONSULT NOTE - Initial Consult  Pharmacy Consult for Coumadin Indication: ASD/TIAs  Allergies  Allergen Reactions  . Dilaudid (Hydromorphone Hcl) Shortness Of Breath  . Morphine And Related Shortness Of Breath  . Sulfa Antibiotics Other (See Comments)    "Made me drunk";  wobbly  . Keflex (Cephalexin) Nausea Only  . Levaquin (Levofloxacin) Hives and Rash    Patient Measurements:    Vital Signs: Temp: 98.2 F (36.8 C) (05/20 2020) Temp src: Oral (05/20 2020) BP: 124/63 mmHg (05/21 0030) Pulse Rate: 82 (05/21 0030)  Labs:  Recent Labs  11/25/12 2040  HGB 14.4  HCT 41.5  PLT 174  LABPROT 18.1*  INR 1.55*  CREATININE 0.82    The CrCl is unknown because both a height and weight (above a minimum accepted value) are required for this calculation.   Medical History: Past Medical History  Diagnosis Date  . GERD (gastroesophageal reflux disease)   . Glaucoma   . Cancer   . Hypertension   . Depression     Medications:  Prescriptions prior to admission  Medication Sig Dispense Refill  . ALPRAZolam (XANAX) 0.25 MG tablet Take 4 tablets (1 mg total) by mouth every 4 (four) hours as needed for anxiety.  40 tablet  0  . amLODipine (NORVASC) 5 MG tablet Take 5 mg by mouth daily.      Marland Kitchen atorvastatin (LIPITOR) 10 MG tablet Take 10 mg by mouth daily.        Marland Kitchen buPROPion (WELLBUTRIN XL) 300 MG 24 hr tablet Take 300 mg by mouth daily.      . calcium carbonate 200 MG capsule Take 600 mg by mouth 2 (two) times daily with a meal.      . ciprofloxacin (CIPRO) 250 MG tablet Take 250 mg by mouth 2 (two) times daily.      Marland Kitchen latanoprost (XALATAN) 0.005 % ophthalmic solution Place 1 drop into both eyes at bedtime.      . methocarbamol (ROBAXIN) 500 MG tablet Take 1-2 tablets (500-1,000 mg total) by mouth every 6 (six) hours as needed.  60 tablet  0  . rOPINIRole (REQUIP) 1 MG tablet Take 1 mg by mouth at bedtime as needed. For restless legs      . sertraline (ZOLOFT)  100 MG tablet Take 150 mg by mouth daily.      Marland Kitchen warfarin (COUMADIN) 2 MG tablet Take 2 mg by mouth every other day.      . warfarin (COUMADIN) 3 MG tablet Take 3 mg by mouth every other day.        Assessment: 79 yo female admitted s/p fall and nondisplaced femur fracture, h/o ASD to continue Coumadin  Goal of Therapy:  INR 2-3 Monitor platelets by anticoagulation protocol: Yes   Plan:  Daily INR  Mikiah Demond, Gary Fleet 11/26/2012,1:53 AM

## 2012-11-26 NOTE — Progress Notes (Signed)
ANTICOAGULATION CONSULT NOTE   Pharmacy Consult for Coumadin Indication: ASD/TIAs  Allergies  Allergen Reactions  . Dilaudid (Hydromorphone Hcl) Shortness Of Breath  . Morphine And Related Shortness Of Breath  . Sulfa Antibiotics Other (See Comments)    "Made me drunk";  wobbly  . Keflex (Cephalexin) Nausea Only  . Levaquin (Levofloxacin) Hives and Rash    Patient Measurements:    Vital Signs: Temp: 98.1 F (36.7 C) (05/21 1300) BP: 114/57 mmHg (05/21 1300) Pulse Rate: 86 (05/21 1300)  Labs:  Recent Labs  11/25/12 2040  HGB 14.4  HCT 41.5  PLT 174  LABPROT 18.1*  INR 1.55*  CREATININE 0.82   The CrCl is unknown because both a height and weight (above a minimum accepted value) are required for this calculation.  Assessment: 79 yo female admitted s/p fall and nondisplaced femur fracture, h/o ASD to continue Coumadin-- no plans for surgical intervention.  Home Coumadin Dose: 3mg  every other day alternating with 2 mg every other day.  INR on admit 1.55 despite concomitant cipro at home.  Last dose 5/19 (3mg ).  Goal of Therapy:  INR 2-3 Monitor platelets by anticoagulation protocol: Yes   Plan:  - Coumadin 5 mg po x 1 (due to low INR and missed dose 5/20) - Daily INR  Gowri Suchan L. Illene Bolus, PharmD, BCPS Clinical Pharmacist Pager: [phone] Pharmacy: [phone] 11/26/2012 2:15 PM

## 2012-11-26 NOTE — Progress Notes (Signed)
INITIAL NUTRITION ASSESSMENT  DOCUMENTATION CODES Per approved criteria  -Not Applicable   INTERVENTION: 1. Boost Plus BID 2. Encouraged pt to include protein at each meal. Reviewed protein sources.  NUTRITION DIAGNOSIS: Unintentional weight loss related to decreased appetite after recent surgery as evidenced by 7% weight loss x 5 months.   Goal: Pt to meet >/= 90% of their estimated nutrition needs.   Monitor:  PO intake, supplement acceptance, weight trend  Reason for Assessment: Hip Fracture  79 y.o. female  Admitting Dx: Fracture of femoral shaft, right, closed  ASSESSMENT: Per record review pt with hx of multiple fx's s/p repair in the past. Pt fell and broke hip, ortho recommending non-surgical management. Per chart pt may require SNF.  Per pt she loses weight after having surgery due to poor appetite. Pt lives alone and has arthritis. She only uses microwave for cooking. Pt eats out about 2 times per week. Pt eats 2 meals per day. Pt likes Boost and is willing to drink.   Nutrition Focused Physical Exam:  Subcutaneous Fat:  Orbital Region: WNL Upper Arm Region: WNL Thoracic and Lumbar Region: WNL  Muscle:  Temple Region: WNL Clavicle Bone Region: mild-moderate wasting Clavicle and Acromion Bone Region: mild-moderate wasting Scapular Bone Region: WNL Dorsal Hand: severe wasting Patellar Region: NA Anterior Thigh Region: NA Posterior Calf Region: NA  Edema: not present   Height: Ht Readings from Last 1 Encounters:  09/15/12 5\' 4"  (1.626 m)    Weight: Wt Readings from Last 1 Encounters:  09/15/12 140 lb 12.8 oz (63.866 kg)    Ideal Body Weight: 54.5 kg  % Ideal Body Weight: 117%  Wt Readings from Last 10 Encounters:  09/15/12 140 lb 12.8 oz (63.866 kg)  08/18/12 123 lb (55.792 kg)  06/02/12 135 lb (61.236 kg)  04/18/12 138 lb (62.596 kg)  04/04/12 146 lb 6.2 oz (66.4 kg)  04/04/12 146 lb 6.2 oz (66.4 kg)  03/28/12 146 lb 6.4 oz (66.407 kg)   09/12/11 145 lb 6.4 oz (65.953 kg)    Usual Body Weight: 145 lb   % Usual Body Weight: 97%  BMI:  24 - WNL  Estimated Nutritional Needs: Kcal: 1500-1700 Protein: 75-85 grams Fluid: > 1.5 L/day  Skin: no issues noted  Diet Order: Cardiac Meal Completion: 50%   EDUCATION NEEDS: -No education needs identified at this time  No intake or output data in the 24 hours ending 11/26/12 1243  Last BM: 5/19   Labs:   Recent Labs Lab 11/25/12 2040  NA 139  K 3.9  CL 103  CO2 22  BUN 21  CREATININE 0.82  CALCIUM 9.4  GLUCOSE 93    CBG (last 3)  No results found for this basename: GLUCAP,  in the last 72 hours  Scheduled Meds: . amLODipine  5 mg Oral Daily  . atorvastatin  10 mg Oral Daily  . buPROPion  300 mg Oral Daily  . calcium carbonate  1,250 mg Oral BID WC  . cefTRIAXone (ROCEPHIN)  IV  1 g Intravenous Q24H  . Chlorhexidine Gluconate Cloth  6 each Topical Q0600  . latanoprost  1 drop Both Eyes QHS  . mupirocin ointment  1 application Nasal BID  . sertraline  150 mg Oral Daily    Continuous Infusions: . sodium chloride 1,000 mL (11/26/12 0231)    Past Medical History  Diagnosis Date  . GERD (gastroesophageal reflux disease)   . Glaucoma   . Cancer   . Hypertension   .  Depression     Past Surgical History  Procedure Laterality Date  . Total knee arthroplasty Bilateral   . Total hip arthroplasty Bilateral   . Mastectomy  1996    left breast lumpectomy   . Incision and drainage hip  03/31/2012    Procedure: IRRIGATION AND DEBRIDEMENT HIP WITH POLY EXCHANGE;  Surgeon: Raymon Mutton, MD;  Location: [facility] OR;  Service: Orthopedics;  Laterality: Left;    Kendell Bane RD, LDN, CNSC [phone] Pager [phone] After Hours Pager

## 2012-11-27 DIAGNOSIS — S72309A Unspecified fracture of shaft of unspecified femur, initial encounter for closed fracture: Secondary | ICD-10-CM | POA: Diagnosis not present

## 2012-11-27 DIAGNOSIS — W19XXXA Unspecified fall, initial encounter: Secondary | ICD-10-CM | POA: Diagnosis not present

## 2012-11-27 DIAGNOSIS — N390 Urinary tract infection, site not specified: Secondary | ICD-10-CM | POA: Diagnosis not present

## 2012-11-27 DIAGNOSIS — Y92009 Unspecified place in unspecified non-institutional (private) residence as the place of occurrence of the external cause: Secondary | ICD-10-CM | POA: Diagnosis not present

## 2012-11-27 LAB — PROTIME-INR
INR: 1.39 (ref 0.00–1.49)
Prothrombin Time: 16.7 seconds — ABNORMAL HIGH (ref 11.6–15.2)

## 2012-11-27 LAB — URINE CULTURE

## 2012-11-27 LAB — CBC
MCH: 28.6 pg (ref 26.0–34.0)
MCHC: 33.2 g/dL (ref 30.0–36.0)
Platelets: 156 10*3/uL (ref 150–400)
RBC: 4.02 MIL/uL (ref 3.87–5.11)

## 2012-11-27 LAB — BASIC METABOLIC PANEL
CO2: 19 mEq/L (ref 19–32)
Calcium: 9 mg/dL (ref 8.4–10.5)
GFR calc non Af Amer: 64 mL/min — ABNORMAL LOW (ref >90)
Sodium: 138 mEq/L (ref 135–145)

## 2012-11-27 MED ORDER — WARFARIN SODIUM 3 MG PO TABS
3.0000 mg | ORAL_TABLET | Freq: Once | ORAL | Status: AC
  Administered 2012-11-27: 3 mg via ORAL
  Filled 2012-11-27: qty 1

## 2012-11-27 NOTE — Progress Notes (Signed)
UR COMPLETED  

## 2012-11-27 NOTE — Progress Notes (Signed)
Physical Therapy Treatment Patient Details Name: Renee Adams MRN: [id number] DOB: 1933-11-21 Today's Date: 11/27/2012 Time: 7829-5621 PT Time Calculation (min): 32 min  PT Assessment / Plan / Recommendation Comments on Treatment Session  Patient unable to attempt hoping today due to increase pain. Able to SPT with 2 assist to [facility] then to recliner. Patient states that she is going to talk with son reguarding having surgery as she understands, from what Dr. [name] told her, that she will be able to put weight through her leg if she proceeds with surgery. She is planning on speaking with Dr. [name] later today    Follow Up Recommendations  Home health PT;Supervision/Assistance - 24 hour     Does the patient have the potential to tolerate intense rehabilitation     Barriers to Discharge        Equipment Recommendations       Recommendations for Other Services    Frequency Min 6X/week   Plan Discharge plan remains appropriate;Frequency remains appropriate    Precautions / Restrictions Precautions Precautions: Fall Restrictions Weight Bearing Restrictions: Yes RLE Weight Bearing: Touchdown weight bearing   Pertinent Vitals/Pain 8/10 R hip pain with movement. Patient premedicated     Mobility  Bed Mobility Supine to Sit: 3: Mod assist;With rails;HOB flat Details for Bed Mobility Assistance: A for B LE. Patient able to assist with R leg movement today and pull self up using rail. Cues for technique/sequency Transfers Transfers: Stand Pivot Transfers Sit to Stand: 2: Max assist;From bed;With upper extremity assist;From chair/3-in-1 Stand to Sit: 2: Max assist;With upper extremity assist;To chair/3-in-1 Stand Pivot Transfers: 1: +2 Total assist Stand Pivot Transfers: Patient Percentage: 60% Details for Transfer Assistance: A to initiate stand and to ensure balance/stability (second person to ensure TDWB). A with SPT for pivoting hips. Patient incontinent of bladder with  stand Ambulation/Gait Ambulation/Gait Assistance: Not tested (comment)    Exercises     PT Diagnosis:    PT Problem List:   PT Treatment Interventions:     PT Goals Acute Rehab PT Goals PT Goal: Supine/Side to Sit - Progress: Progressing toward goal PT Goal: Sit to Stand - Progress: Progressing toward goal PT Goal: Stand to Sit - Progress: Progressing toward goal PT Transfer Goal: Bed to Chair/Chair to Bed - Progress: Progressing toward goal  Visit Information  Last PT Received On: 11/27/12 Assistance Needed: +2    Subjective Data      Cognition  Cognition Arousal/Alertness: Awake/alert Behavior During Therapy: WFL for tasks assessed/performed Overall Cognitive Status: Within Functional Limits for tasks assessed    Balance     End of Session PT - End of Session Equipment Utilized During Treatment: Gait belt Activity Tolerance: Patient limited by pain Patient left: in chair;with call bell/phone within reach Nurse Communication: Mobility status   GP     Fredrich Birks 11/27/2012, 11:24 AM 11/27/2012 Fredrich Birks PTA [phone] pager [phone] office

## 2012-11-27 NOTE — Progress Notes (Signed)
Patient ID: Renee Adams  female  ZOX:[id number]    DOB: Jan 30, 1934    DOA: 11/25/2012  PCP: Benita Stabile, MD  Assessment/Plan: Principal Problem:   Fracture of femoral shaft, right, closed - Orthopedics consulted, Dr [name], patient states that she is leaning towards the surgery. Dr. [name] to follow up today. - may need rehabilitation  Active Problems:   Essential hypertension, benign- stable   UTI  -  will continue Rocephin for 1 more day to finish the course   - history of recurrent TIAs, ASD:  - Continue Coumadin. If Ortho plans to have surgery, will need to hold Coumadin and place her on heparin drip.    DVT Prophylaxis:  Code Status:  Disposition:    Subjective: Feeling better although she is leaning towards having the surgery now, waiting for Dr [name]. States had difficult time today to get to the chair and hopefully surgery will improve the healing time.  Objective: Weight change:   Intake/Output Summary (Last 24 hours) at 11/27/12 1259 Last data filed at 11/27/12 0525  Gross per 24 hour  Intake    240 ml  Output   1100 ml  Net   -860 ml   Blood pressure 130/65, pulse 76, temperature 98.5 F (36.9 C), temperature source Oral, resp. rate 16, SpO2 96.00%.  Physical Exam: General: Alert and awake, oriented x3, not in any acute distress. HEENT: anicteric sclera, PERLA, EOMI CVS: S1-S2 clear, no murmur rubs or gallops Chest: clear to auscultation bilaterally, no wheezing, rales or rhonchi Abdomen: soft nontender, nondistended, normal bowel sounds  Extremities: no cyanosis, clubbing or edema noted bilaterally   Lab Results: Basic Metabolic Panel:  Recent Labs Lab 11/25/12 2040 11/27/12 0430  NA 139 138  K 3.9 3.7  CL 103 106  CO2 22 19  GLUCOSE 93 79  BUN 21 16  CREATININE 0.82 0.86  CALCIUM 9.4 9.0   CBC:  Recent Labs Lab 11/25/12 2040 11/27/12 0430  WBC 7.0 6.7  NEUTROABS 5.3  --   HGB 14.4 11.5*  HCT 41.5 34.6*  MCV 86.1 86.1   PLT 174 156   Cardiac Enzymes: No results found for this basename: CKTOTAL, CKMB, CKMBINDEX, TROPONINI,  in the last 168 hours BNP: No components found with this basename: POCBNP,  CBG: No results found for this basename: GLUCAP,  in the last 168 hours   Micro Results: Recent Results (from the past 240 hour(s))  URINE CULTURE     Status: None   Collection Time    11/25/12 10:39 PM      Result Value Range Status   Specimen Description URINE, CATHETERIZED   Final   Special Requests NONE   Final   Culture  Setup Time 11/26/2012 09:31   Final   Colony Count NO GROWTH   Final   Culture NO GROWTH   Final   Report Status 11/27/2012 FINAL   Final  SURGICAL PCR SCREEN     Status: Abnormal   Collection Time    11/26/12  4:43 AM      Result Value Range Status   MRSA, PCR POSITIVE (*) NEGATIVE Final   Comment: RESULT CALLED TO, READ BACK BY AND VERIFIED WITH:     Verline Lema RN [id number] [phone] EBANKS COLCLOUGH, S   Staphylococcus aureus POSITIVE (*) NEGATIVE Final   Comment:            The Xpert SA Assay (FDA     approved for NASAL specimens  in patients over 35 years of age),     is one component of     a comprehensive surveillance     program.  Test performance has     been validated by [employer] for patients greater     than or equal to 38 year old.     It is not intended     to diagnose infection nor to     guide or monitor treatment.     RESULT CALLED TO, READ BACK BY AND VERIFIED WITH:     Verline Lema RN [id number] [phone] [facility] COLCLOUGH, S    Studies/Results: Dg Hip Complete Left  11/25/2012   *RADIOLOGY REPORT*  Clinical Data: Larey Seat.  Left hip pain.  LEFT HIP - COMPLETE 2+ VIEW  Comparison: 09/15/2011.  Findings: The acetabular Insert is likely loose as suggested on the prior examination.  No acute fracture or femoral head dislocation.  IMPRESSION: No acute fracture.   Original Report Authenticated By: Rudie Meyer, M.D.   Dg Hip Complete Right  11/25/2012    *RADIOLOGY REPORT*  Clinical Data: Larey Seat.  Bilateral hip pain.  RIGHT HIP - COMPLETE 2+ VIEW  Comparison: 09/25/2011.  Findings: Bilateral hip prosthesis appears stable.  The acetabular Insert is likely loose as suggested on the prior study.  No femoral head dislocation. There is a lateral cortical fracture near the tip of the right prosthesis.  Stable areas of heterotopic ossification.  IMPRESSION: Lateral cortical fracture near the tip of the right femoral prosthesis.   Original Report Authenticated By: Rudie Meyer, M.D.   Dg Femur Right  11/25/2012   *RADIOLOGY REPORT*  Clinical Data: Larey Seat.  Right hip pain.  RIGHT FEMUR - 2 VIEW  Comparison: Earlier right hip films.  Findings:  Again demonstrated is a oblique coursing fracture in the distal tip of the right femoral prosthesis without displacement. The distal femoral prosthesis is intact.  No distal femur fracture.  IMPRESSION: Nondisplaced oblique coursing lateral cortical fracture of the proximal femur near the tip of the prosthesis.   Original Report Authenticated By: Rudie Meyer, M.D.   Ct Head Wo Contrast  11/25/2012   *RADIOLOGY REPORT*  Clinical Data: Larey Seat.  Hit head.  CT HEAD WITHOUT CONTRAST  Technique:  Contiguous axial images were obtained from the base of the skull through the vertex without contrast.  Comparison: 11/26/2008.  Findings: Stable area of encephalomalacia in the left high parietal area likely due to prior infarction.  The ventricles are normal and stable.  No extra-axial fluid collections are identified.  No CT findings for acute hemispheric infarction and/or intracranial hemorrhage.  No mass lesions.  The brainstem and cerebellum grossly normal and stable.  The bony structures are intact.  No acute skull fracture.  The paranasal sinuses and mastoid air cells are clear.  The globes are intact.  IMPRESSION:  1.  Remote left parietal infarct. 2.  No acute intracranial findings for acute skull fracture.   Original Report Authenticated  By: Rudie Meyer, M.D.    Medications: Scheduled Meds: . amLODipine  5 mg Oral Daily  . atorvastatin  10 mg Oral Daily  . buPROPion  300 mg Oral Daily  . calcium carbonate  1,250 mg Oral BID WC  . Chlorhexidine Gluconate Cloth  6 each Topical Q0600  . docusate sodium  100 mg Oral BID  . lactose free nutrition  237 mL Oral BID BM  . latanoprost  1 drop Both Eyes QHS  . mupirocin  ointment  1 application Nasal BID  . sertraline  150 mg Oral Daily  . Warfarin - Pharmacist Dosing Inpatient   Does not apply q1800      LOS: 2 days   RAI,RIPUDEEP M.D. [facility] 11/27/2012, 12:59 PM Pager: [phone]  If 7PM-7AM, please contact night-coverage [email or web address] Password [email or web address]

## 2012-11-27 NOTE — Progress Notes (Signed)
ANTICOAGULATION CONSULT NOTE - Follow Up Consult  Pharmacy Consult for Coumadin Indication: ASD, TIA  Allergies  Allergen Reactions  . Dilaudid (Hydromorphone Hcl) Shortness Of Breath  . Morphine And Related Shortness Of Breath  . Sulfa Antibiotics Other (See Comments)    "Made me drunk";  wobbly  . Keflex (Cephalexin) Nausea Only  . Levaquin (Levofloxacin) Hives and Rash    Patient Measurements:   Heparin Dosing Weight:   Vital Signs: Temp: 98.5 F (36.9 C) (05/22 0525) Temp src: Oral (05/22 0525) BP: 130/65 mmHg (05/22 0525) Pulse Rate: 76 (05/22 0525)  Labs:  Recent Labs  11/25/12 2040 11/27/12 0430  HGB 14.4 11.5*  HCT 41.5 34.6*  PLT 174 156  LABPROT 18.1* 16.7*  INR 1.55* 1.39  CREATININE 0.82 0.86    The CrCl is unknown because both a height and weight (above a minimum accepted value) are required for this calculation.   Medications:  Scheduled:  . amLODipine  5 mg Oral Daily  . atorvastatin  10 mg Oral Daily  . buPROPion  300 mg Oral Daily  . calcium carbonate  1,250 mg Oral BID WC  . Chlorhexidine Gluconate Cloth  6 each Topical Q0600  . docusate sodium  100 mg Oral BID  . lactose free nutrition  237 mL Oral BID BM  . latanoprost  1 drop Both Eyes QHS  . mupirocin ointment  1 application Nasal BID  . sertraline  150 mg Oral Daily  . Warfarin - Pharmacist Dosing Inpatient   Does not apply q1800    Assessment: 79yo female s/p fall with non-dispaced femur fx.  Continuing Coumadin from home for ASD, TIA; she missed her dose on 5/20.  INR 1.39 this AM, down again.  No problems noted.  Surgery is being considered.  Goal of Therapy:  INR 2-3 Monitor platelets by anticoagulation protocol: Yes   Plan:  1.  Coumadin 3 mg 2.  F/U plans for OR  Marisue Humble, PharmD Clinical Pharmacist [facility]

## 2012-11-28 DIAGNOSIS — Y92009 Unspecified place in unspecified non-institutional (private) residence as the place of occurrence of the external cause: Secondary | ICD-10-CM

## 2012-11-28 DIAGNOSIS — W19XXXA Unspecified fall, initial encounter: Secondary | ICD-10-CM | POA: Diagnosis not present

## 2012-11-28 DIAGNOSIS — I10 Essential (primary) hypertension: Secondary | ICD-10-CM | POA: Diagnosis not present

## 2012-11-28 DIAGNOSIS — S72309A Unspecified fracture of shaft of unspecified femur, initial encounter for closed fracture: Secondary | ICD-10-CM | POA: Diagnosis not present

## 2012-11-28 DIAGNOSIS — N390 Urinary tract infection, site not specified: Secondary | ICD-10-CM

## 2012-11-28 LAB — CBC
MCH: 29 pg (ref 26.0–34.0)
MCHC: 33.4 g/dL (ref 30.0–36.0)
Platelets: 152 10*3/uL (ref 150–400)
RBC: 3.9 MIL/uL (ref 3.87–5.11)
RDW: 14.9 % (ref 11.5–15.5)

## 2012-11-28 LAB — PROTIME-INR
INR: 1.94 — ABNORMAL HIGH (ref 0.00–1.49)
Prothrombin Time: 21.4 seconds — ABNORMAL HIGH (ref 11.6–15.2)

## 2012-11-28 LAB — BASIC METABOLIC PANEL
CO2: 21 mEq/L (ref 19–32)
Calcium: 9 mg/dL (ref 8.4–10.5)
GFR calc Af Amer: 77 mL/min — ABNORMAL LOW (ref >90)
GFR calc non Af Amer: 67 mL/min — ABNORMAL LOW (ref >90)
Sodium: 139 mEq/L (ref 135–145)

## 2012-11-28 MED ORDER — HEPARIN (PORCINE) IN NACL 100-0.45 UNIT/ML-% IJ SOLN
900.0000 [IU]/h | INTRAMUSCULAR | Status: DC
  Administered 2012-11-28: 900 [IU]/h via INTRAVENOUS
  Filled 2012-11-28 (×3): qty 250

## 2012-11-28 NOTE — Progress Notes (Addendum)
OT Cancellation Note  Patient Details Name: KORYN CHARLOT MRN: [id number] DOB: Oct 16, 1933   Cancelled Treatment:    Reason Eval/Treat Not Completed: Patient not medically ready;Other (comment) Patient planning on OR Sunday. Will await new OT orders after surgery. Please re-order when pt appropriate to see.    Earlie Raveling OTR/L [phone] 11/28/2012, 1:04 PM

## 2012-11-28 NOTE — Progress Notes (Signed)
Patient ID: Renee Adams  female  ZOX:[id number]    DOB: 11/12/1933    DOA: 11/25/2012  PCP: Benita Stabile, MD  Assessment/Plan: Principal Problem:   Fracture of femoral shaft, right, closed - Ortho following, plan right hip ORIF on Sunday, 11/30/12 AM.  - INR is still 1.94. Will hold coumadin, place on heparin gtt, hold heparin prior to surgery with ortho preference    Active Problems:   Essential hypertension, benign- stable   UTI  -Completed the course of antibiotics   History of recurrent TIAs, ASD:  - Hold Coumadin, place on heparin drip  DVT Prophylaxis:  Code Status:  Disposition: Will likely need rehabilitation, patient lives alone    Subjective: Awaiting ortho decision, pain controlled   Objective: Weight change:   Intake/Output Summary (Last 24 hours) at 11/28/12 1434 Last data filed at 11/28/12 1420  Gross per 24 hour  Intake    940 ml  Output    800 ml  Net    140 ml   Blood pressure 103/54, pulse 87, temperature 98.2 F (36.8 C), temperature source Oral, resp. rate 16, height 5\' 4"  (1.626 m), weight 63.504 kg (140 lb), SpO2 94.00%.  Physical Exam: General: A x O x3, NAD CVS: S1-S2 clear, no murmur rubs or gallops Chest: CTAB Abdomen: soft NT, ND, NBS  Extremities: no c/c/e    Lab Results: Basic Metabolic Panel:  Recent Labs Lab 11/27/12 0430 11/28/12 0557  NA 138 139  K 3.7 3.6  CL 106 108  CO2 19 21  GLUCOSE 79 81  BUN 16 15  CREATININE 0.86 0.83  CALCIUM 9.0 9.0   CBC:  Recent Labs Lab 11/25/12 2040 11/27/12 0430 11/28/12 0557  WBC 7.0 6.7 5.8  NEUTROABS 5.3  --   --   HGB 14.4 11.5* 11.3*  HCT 41.5 34.6* 33.8*  MCV 86.1 86.1 86.7  PLT 174 156 152   Cardiac Enzymes: No results found for this basename: CKTOTAL, CKMB, CKMBINDEX, TROPONINI,  in the last 168 hours BNP: No components found with this basename: POCBNP,  CBG: No results found for this basename: GLUCAP,  in the last 168 hours   Micro Results: Recent  Results (from the past 240 hour(s))  URINE CULTURE     Status: None   Collection Time    11/25/12 10:39 PM      Result Value Range Status   Specimen Description URINE, CATHETERIZED   Final   Special Requests NONE   Final   Culture  Setup Time 11/26/2012 09:31   Final   Colony Count NO GROWTH   Final   Culture NO GROWTH   Final   Report Status 11/27/2012 FINAL   Final  SURGICAL PCR SCREEN     Status: Abnormal   Collection Time    11/26/12  4:43 AM      Result Value Range Status   MRSA, PCR POSITIVE (*) NEGATIVE Final   Comment: RESULT CALLED TO, READ BACK BY AND VERIFIED WITH:     Verline Lema RN [id number] [phone] EBANKS COLCLOUGH, S   Staphylococcus aureus POSITIVE (*) NEGATIVE Final   Comment:            The Xpert SA Assay (FDA     approved for NASAL specimens     in patients over 71 years of age),     is one component of     a comprehensive surveillance     program.  Test performance has  been validated by [facility] for patients greater     than or equal to 51 year old.     It is not intended     to diagnose infection nor to     guide or monitor treatment.     RESULT CALLED TO, READ BACK BY AND VERIFIED WITH:     Verline Lema RN [id number] [phone] [facility] COLCLOUGH, S    Studies/Results: Dg Hip Complete Left  11/25/2012   *RADIOLOGY REPORT*  Clinical Data: Larey Seat.  Left hip pain.  LEFT HIP - COMPLETE 2+ VIEW  Comparison: 09/15/2011.  Findings: The acetabular Insert is likely loose as suggested on the prior examination.  No acute fracture or femoral head dislocation.  IMPRESSION: No acute fracture.   Original Report Authenticated By: Rudie Meyer, M.D.   Dg Hip Complete Right  11/25/2012   *RADIOLOGY REPORT*  Clinical Data: Larey Seat.  Bilateral hip pain.  RIGHT HIP - COMPLETE 2+ VIEW  Comparison: 09/25/2011.  Findings: Bilateral hip prosthesis appears stable.  The acetabular Insert is likely loose as suggested on the prior study.  No femoral head dislocation. There is a lateral  cortical fracture near the tip of the right prosthesis.  Stable areas of heterotopic ossification.  IMPRESSION: Lateral cortical fracture near the tip of the right femoral prosthesis.   Original Report Authenticated By: Rudie Meyer, M.D.   Dg Femur Right  11/25/2012   *RADIOLOGY REPORT*  Clinical Data: Larey Seat.  Right hip pain.  RIGHT FEMUR - 2 VIEW  Comparison: Earlier right hip films.  Findings:  Again demonstrated is a oblique coursing fracture in the distal tip of the right femoral prosthesis without displacement. The distal femoral prosthesis is intact.  No distal femur fracture.  IMPRESSION: Nondisplaced oblique coursing lateral cortical fracture of the proximal femur near the tip of the prosthesis.   Original Report Authenticated By: Rudie Meyer, M.D.   Ct Head Wo Contrast  11/25/2012   *RADIOLOGY REPORT*  Clinical Data: Larey Seat.  Hit head.  CT HEAD WITHOUT CONTRAST  Technique:  Contiguous axial images were obtained from the base of the skull through the vertex without contrast.  Comparison: 11/26/2008.  Findings: Stable area of encephalomalacia in the left high parietal area likely due to prior infarction.  The ventricles are normal and stable.  No extra-axial fluid collections are identified.  No CT findings for acute hemispheric infarction and/or intracranial hemorrhage.  No mass lesions.  The brainstem and cerebellum grossly normal and stable.  The bony structures are intact.  No acute skull fracture.  The paranasal sinuses and mastoid air cells are clear.  The globes are intact.  IMPRESSION:  1.  Remote left parietal infarct. 2.  No acute intracranial findings for acute skull fracture.   Original Report Authenticated By: Rudie Meyer, M.D.    Medications: Scheduled Meds: . amLODipine  5 mg Oral Daily  . atorvastatin  10 mg Oral Daily  . buPROPion  300 mg Oral Daily  . calcium carbonate  1,250 mg Oral BID WC  . Chlorhexidine Gluconate Cloth  6 each Topical Q0600  . docusate sodium  100 mg Oral  BID  . lactose free nutrition  237 mL Oral BID BM  . latanoprost  1 drop Both Eyes QHS  . mupirocin ointment  1 application Nasal BID  . sertraline  150 mg Oral Daily  . Warfarin - Pharmacist Dosing Inpatient   Does not apply q1800      LOS: 3 days  Jeidy Hoerner M.D. [facility] 11/28/2012, 2:34 PM Pager: [phone]  If 7PM-7AM, please contact night-coverage [email or web address] Password [email or web address]

## 2012-11-28 NOTE — Progress Notes (Signed)
Pt c/o nausea with dry heaves & stomach "tightness." PRN zofran given. BP 140/65, P85. Denies any SOB or chest pain. Cont to monitor.

## 2012-11-28 NOTE — Progress Notes (Signed)
ANTICOAGULATION CONSULT NOTE - Initial Consult  Pharmacy Consult for heparin Indication: Bridge for surgery (coumadin)  Allergies  Allergen Reactions  . Dilaudid (Hydromorphone Hcl) Shortness Of Breath  . Morphine And Related Shortness Of Breath  . Sulfa Antibiotics Other (See Comments)    "Made me drunk";  wobbly  . Keflex (Cephalexin) Nausea Only  . Levaquin (Levofloxacin) Hives and Rash    Patient Measurements: Height: 5\' 4"  (162.6 cm) Weight: 140 lb (63.504 kg) IBW/kg (Calculated) : 54.7 Heparin Dosing Weight: 63.5  Vital Signs: Temp: 98.2 F (36.8 C) (05/23 1418) Temp src: Oral (05/23 0650) BP: 103/54 mmHg (05/23 1418) Pulse Rate: 87 (05/23 1418)  Labs:  Recent Labs  11/25/12 2040 11/27/12 0430 11/28/12 0557  HGB 14.4 11.5* 11.3*  HCT 41.5 34.6* 33.8*  PLT 174 156 152  LABPROT 18.1* 16.7* 21.4*  INR 1.55* 1.39 1.94*  CREATININE 0.82 0.86 0.83    Estimated Creatinine Clearance: 49.8 ml/min (by C-G formula based on Cr of 0.83).   Medical History: Past Medical History  Diagnosis Date  . GERD (gastroesophageal reflux disease)   . Glaucoma   . Cancer   . Hypertension   . Depression     Medications:  Scheduled:  . amLODipine  5 mg Oral Daily  . atorvastatin  10 mg Oral Daily  . buPROPion  300 mg Oral Daily  . calcium carbonate  1,250 mg Oral BID WC  . Chlorhexidine Gluconate Cloth  6 each Topical Q0600  . docusate sodium  100 mg Oral BID  . lactose free nutrition  237 mL Oral BID BM  . latanoprost  1 drop Both Eyes QHS  . mupirocin ointment  1 application Nasal BID  . sertraline  150 mg Oral Daily    Assessment: Pt for rt hip ORIF on 5/25. Coumadin for ADS, TIA has been discontinued. Bridge with heparin. Goal of Therapy:  Heparin level 0.3-0.7 units/ml Monitor platelets by anticoagulation protocol: Yes   Plan:  INR is 1.94. Will start heparin without bolus. Heparin 900 units/hr Heparin level in 6 hrs, then daily heparin level and  CBC.  Eugene Garnet 11/28/2012,3:01 PM

## 2012-11-28 NOTE — Progress Notes (Signed)
PT Cancellation Note  Patient Details Name: Renee Adams MRN: [id number] DOB: 1934-06-29   Cancelled Treatment:    Reason Eval/Treat Not Completed: Patient not medically ready;Other (comment). Patient planning on OR Sunday. Will await new PT orders after surgery. Patient to get OOB with nursing staff until sx.    Fredrich Birks 11/28/2012, 11:43 AM

## 2012-11-28 NOTE — Progress Notes (Signed)
Pt's heparin gtt initiated and double checked with Nettie Elm, RN. Given off scheduled d/t being unavailable per pharmacy.

## 2012-11-28 NOTE — Progress Notes (Signed)
SPORTS MEDICINE AND JOINT REPLACEMENT  Georgena Spurling, MD   [name], PA-C [address] [place], [place], [place]  [address]                             [phone]   PROGRESS NOTE  Subjective:  negative for Chest Pain  negative for Shortness of Breath  negative for Nausea/Vomiting   negative for Calf Pain  negative for Bowel Movement   Tolerating Diet: yes         Patient reports pain as 6 on 0-10 scale.    Objective: Vital signs in last 24 hours:   Patient Vitals for the past 24 hrs:  BP Temp Temp src Pulse Resp SpO2 Height Weight  11/28/12 0650 122/65 mmHg 98.7 F (37.1 C) Oral 80 16 98 % - -  11/28/12 0625 - - - - - - 5\' 4"  (1.626 m) 63.504 kg (140 lb)  11/27/12 2220 128/70 mmHg 99 F (37.2 C) Oral 88 16 95 % - -  11/27/12 1500 138/60 mmHg 99.2 F (37.3 C) - 91 18 93 % - -    @flow {1959:LAST@   Intake/Output from previous day:   05/22 0701 - 05/23 0700 In: 1180 [P.O.:960; I.V.:220] Out: 550 [Urine:550]   Intake/Output this shift:       Intake/Output     05/22 0701 - 05/23 0700 05/23 0701 - 05/24 0700   P.O. 960    I.V. (mL/kg) 220 (3.5)    Total Intake(mL/kg) 1180 (18.6)    Urine (mL/kg/hr) 550 (0.4)    Total Output 550     Net +630          Urine Occurrence 1 x       LABORATORY DATA:  Recent Labs  11/25/12 2040 11/27/12 0430 11/28/12 0557  WBC 7.0 6.7 5.8  HGB 14.4 11.5* 11.3*  HCT 41.5 34.6* 33.8*  PLT 174 156 152    Recent Labs  11/25/12 2040 11/27/12 0430 11/28/12 0557  NA 139 138 139  K 3.9 3.7 3.6  CL 103 106 108  CO2 22 19 21   BUN 21 16 15   CREATININE 0.82 0.86 0.83  GLUCOSE 93 79 81  CALCIUM 9.4 9.0 9.0   Lab Results  Component Value Date   INR 1.94* 11/28/2012   INR 1.39 11/27/2012   INR 1.55* 11/25/2012    Examination:  General appearance: alert, cooperative and no distress Extremities: extremities normal, atraumatic, no cyanosis or edema and Homans sign is negative, no sign of DVT  Sensory Exam: Deep Peroneal  normal   Assessment:          ADDITIONAL DIAGNOSIS:  Principal Problem:   Fracture of femoral shaft, right, closed Active Problems:   Essential hypertension, benign   Fall   UTI (urinary tract infection)     Plan: Dr. [name] to do right hip orif on Sunday 7:30. Case posted and will be made NPO midnight before surgery       Brownie Gockel 11/28/2012, 10:20 AM

## 2012-11-29 DIAGNOSIS — W19XXXA Unspecified fall, initial encounter: Secondary | ICD-10-CM | POA: Diagnosis not present

## 2012-11-29 DIAGNOSIS — S72309A Unspecified fracture of shaft of unspecified femur, initial encounter for closed fracture: Secondary | ICD-10-CM | POA: Diagnosis not present

## 2012-11-29 DIAGNOSIS — Y92009 Unspecified place in unspecified non-institutional (private) residence as the place of occurrence of the external cause: Secondary | ICD-10-CM | POA: Diagnosis not present

## 2012-11-29 DIAGNOSIS — I10 Essential (primary) hypertension: Secondary | ICD-10-CM | POA: Diagnosis not present

## 2012-11-29 LAB — CBC
Hemoglobin: 10.8 g/dL — ABNORMAL LOW (ref 12.0–15.0)
MCH: 29.1 pg (ref 26.0–34.0)
MCV: 87.1 fL (ref 78.0–100.0)
Platelets: 169 10*3/uL (ref 150–400)
RBC: 3.71 MIL/uL — ABNORMAL LOW (ref 3.87–5.11)

## 2012-11-29 MED ORDER — PHYTONADIONE 5 MG PO TABS
2.5000 mg | ORAL_TABLET | Freq: Once | ORAL | Status: AC
  Administered 2012-11-29: 2.5 mg via ORAL
  Filled 2012-11-29: qty 1

## 2012-11-29 MED ORDER — VANCOMYCIN HCL IN DEXTROSE 1-5 GM/200ML-% IV SOLN
1000.0000 mg | INTRAVENOUS | Status: AC
  Administered 2012-11-30: 1000 mg via INTRAVENOUS
  Filled 2012-11-29: qty 200

## 2012-11-29 MED ORDER — CHLORHEXIDINE GLUCONATE 4 % EX LIQD
60.0000 mL | Freq: Once | CUTANEOUS | Status: AC
  Administered 2012-11-30: 4 via TOPICAL
  Filled 2012-11-29: qty 60

## 2012-11-29 MED ORDER — POLYETHYLENE GLYCOL 3350 17 G PO PACK
17.0000 g | PACK | Freq: Every day | ORAL | Status: DC | PRN
  Administered 2012-11-29: 17 g via ORAL
  Filled 2012-11-29: qty 1

## 2012-11-29 MED ORDER — BISACODYL 10 MG RE SUPP
10.0000 mg | Freq: Once | RECTAL | Status: DC
  Filled 2012-11-29: qty 1

## 2012-11-29 NOTE — Progress Notes (Signed)
Patient ID: Renee Adams  female  AVW:[id number]    DOB: 1933/12/08    DOA: 11/25/2012  PCP: Benita Stabile, MD  Assessment/Plan: Principal Problem:   Fracture of femoral shaft, right, closed - Ortho following, plan right hip ORIF on Sunday, 11/30/12 AM.  - INR 2.0, Coumadin was held yesterday, placed on heparin drip, will give one dose of oral vitamin K 2.5 mg - N.p.o. after midnight for the surgery, ortho to decide when to hold heparin drip prior to the surgery   Active Problems:   Essential hypertension, benign- stable   UTI  -Completed the course of antibiotics   History of recurrent TIAs, ASD:  - Hold Coumadin, placed on heparin drip  Constipation: Place on Dulcolax suppository, MiraLax  DVT Prophylaxis:  Code Status:  Disposition: Will likely need rehabilitation, patient lives alone    Subjective:  pain controlled, complaining of constipation   Objective: Weight change:   Intake/Output Summary (Last 24 hours) at 11/29/12 1048 Last data filed at 11/29/12 0800  Gross per 24 hour  Intake    360 ml  Output    250 ml  Net    110 ml   Blood pressure 126/55, pulse 74, temperature 98.7 F (37.1 C), temperature source Oral, resp. rate 16, height 5\' 4"  (1.626 m), weight 63.504 kg (140 lb), SpO2 95.00%.  Physical Exam: General: A x O x3, NAD CVS: S1-S2 clear, no murmur rubs or gallops Chest: CTAB Abdomen: soft NT, ND, NBS  Extremities: no c/c/e    Lab Results: Basic Metabolic Panel:  Recent Labs Lab 11/27/12 0430 11/28/12 0557  NA 138 139  K 3.7 3.6  CL 106 108  CO2 19 21  GLUCOSE 79 81  BUN 16 15  CREATININE 0.86 0.83  CALCIUM 9.0 9.0   CBC:  Recent Labs Lab 11/25/12 2040  11/28/12 0557 11/29/12 0315  WBC 7.0  < > 5.8 6.6  NEUTROABS 5.3  --   --   --   HGB 14.4  < > 11.3* 10.8*  HCT 41.5  < > 33.8* 32.3*  MCV 86.1  < > 86.7 87.1  PLT 174  < > 152 169  < > = values in this interval not displayed. Cardiac Enzymes: No results found  for this basename: CKTOTAL, CKMB, CKMBINDEX, TROPONINI,  in the last 168 hours BNP: No components found with this basename: POCBNP,  CBG: No results found for this basename: GLUCAP,  in the last 168 hours   Micro Results: Recent Results (from the past 240 hour(s))  URINE CULTURE     Status: None   Collection Time    11/25/12 10:39 PM      Result Value Range Status   Specimen Description URINE, CATHETERIZED   Final   Special Requests NONE   Final   Culture  Setup Time 11/26/2012 09:31   Final   Colony Count NO GROWTH   Final   Culture NO GROWTH   Final   Report Status 11/27/2012 FINAL   Final  SURGICAL PCR SCREEN     Status: Abnormal   Collection Time    11/26/12  4:43 AM      Result Value Range Status   MRSA, PCR POSITIVE (*) NEGATIVE Final   Comment: RESULT CALLED TO, READ BACK BY AND VERIFIED WITH:     Verline Lema RN [id number] [phone] EBANKS COLCLOUGH, S   Staphylococcus aureus POSITIVE (*) NEGATIVE Final   Comment:  The Xpert SA Assay (FDA     approved for NASAL specimens     in patients over 81 years of age),     is one component of     a comprehensive surveillance     program.  Test performance has     been validated by [employer] for patients greater     than or equal to 23 year old.     It is not intended     to diagnose infection nor to     guide or monitor treatment.     RESULT CALLED TO, READ BACK BY AND VERIFIED WITH:     Verline Lema RN [id number] [phone] [facility] COLCLOUGH, S    Studies/Results: Dg Hip Complete Left  11/25/2012   *RADIOLOGY REPORT*  Clinical Data: Larey Seat.  Left hip pain.  LEFT HIP - COMPLETE 2+ VIEW  Comparison: 09/15/2011.  Findings: The acetabular Insert is likely loose as suggested on the prior examination.  No acute fracture or femoral head dislocation.  IMPRESSION: No acute fracture.   Original Report Authenticated By: Rudie Meyer, M.D.   Dg Hip Complete Right  11/25/2012   *RADIOLOGY REPORT*  Clinical Data: Larey Seat.  Bilateral hip  pain.  RIGHT HIP - COMPLETE 2+ VIEW  Comparison: 09/25/2011.  Findings: Bilateral hip prosthesis appears stable.  The acetabular Insert is likely loose as suggested on the prior study.  No femoral head dislocation. There is a lateral cortical fracture near the tip of the right prosthesis.  Stable areas of heterotopic ossification.  IMPRESSION: Lateral cortical fracture near the tip of the right femoral prosthesis.   Original Report Authenticated By: Rudie Meyer, M.D.   Dg Femur Right  11/25/2012   *RADIOLOGY REPORT*  Clinical Data: Larey Seat.  Right hip pain.  RIGHT FEMUR - 2 VIEW  Comparison: Earlier right hip films.  Findings:  Again demonstrated is a oblique coursing fracture in the distal tip of the right femoral prosthesis without displacement. The distal femoral prosthesis is intact.  No distal femur fracture.  IMPRESSION: Nondisplaced oblique coursing lateral cortical fracture of the proximal femur near the tip of the prosthesis.   Original Report Authenticated By: Rudie Meyer, M.D.   Ct Head Wo Contrast  11/25/2012   *RADIOLOGY REPORT*  Clinical Data: Larey Seat.  Hit head.  CT HEAD WITHOUT CONTRAST  Technique:  Contiguous axial images were obtained from the base of the skull through the vertex without contrast.  Comparison: 11/26/2008.  Findings: Stable area of encephalomalacia in the left high parietal area likely due to prior infarction.  The ventricles are normal and stable.  No extra-axial fluid collections are identified.  No CT findings for acute hemispheric infarction and/or intracranial hemorrhage.  No mass lesions.  The brainstem and cerebellum grossly normal and stable.  The bony structures are intact.  No acute skull fracture.  The paranasal sinuses and mastoid air cells are clear.  The globes are intact.  IMPRESSION:  1.  Remote left parietal infarct. 2.  No acute intracranial findings for acute skull fracture.   Original Report Authenticated By: Rudie Meyer, M.D.    Medications: Scheduled  Meds: . amLODipine  5 mg Oral Daily  . atorvastatin  10 mg Oral Daily  . bisacodyl  10 mg Rectal Once  . buPROPion  300 mg Oral Daily  . calcium carbonate  1,250 mg Oral BID WC  . Chlorhexidine Gluconate Cloth  6 each Topical Q0600  . docusate sodium  100 mg  Oral BID  . lactose free nutrition  237 mL Oral BID BM  . latanoprost  1 drop Both Eyes QHS  . mupirocin ointment  1 application Nasal BID  . phytonadione  2.5 mg Oral Once  . sertraline  150 mg Oral Daily      LOS: 4 days   Gresia Isidoro M.D. [facility] 11/29/2012, 10:48 AM Pager: [phone]  If 7PM-7AM, please contact night-coverage [email or web address] Password [email or web address]

## 2012-11-29 NOTE — Progress Notes (Signed)
Subjective:     Patient reports pain as 5 on 0-10 scale.    Objective: Vital signs in last 24 hours: Temp:  [98.2 F (36.8 C)-98.7 F (37.1 C)] 98.7 F (37.1 C) (05/24 0534) Pulse Rate:  [68-87] 74 (05/24 0534) Resp:  [16-17] 16 (05/24 0534) BP: (103-126)/(54-55) 126/55 mmHg (05/24 0534) SpO2:  [94 %-96 %] 95 % (05/24 0534)  Intake/Output from previous day: 05/23 0701 - 05/24 0700 In: 360 [P.O.:360] Out: 250 [Urine:250] Intake/Output this shift: Total I/O In: 240 [P.O.:240] Out: -    Recent Labs  11/27/12 0430 11/28/12 0557 11/29/12 0315  HGB 11.5* 11.3* 10.8*    Recent Labs  11/28/12 0557 11/29/12 0315  WBC 5.8 6.6  RBC 3.90 3.71*  HCT 33.8* 32.3*  PLT 152 169    Recent Labs  11/27/12 0430 11/28/12 0557  NA 138 139  K 3.7 3.6  CL 106 108  CO2 19 21  BUN 16 15  CREATININE 0.86 0.83  GLUCOSE 79 81  CALCIUM 9.0 9.0    Recent Labs  11/28/12 0557 11/29/12 0315  INR 1.94* 2.08*    ABD soft Neurovascular intact Dorsiflexion/Plantar flexion intact Compartment soft  Assessment/Plan:    awaitng surery in am Npo after midnight  Up with therapy post op  ROBERTS,JANE B 11/29/2012, 10:16 AM

## 2012-11-29 NOTE — Progress Notes (Signed)
Please discontinue Heparin gtt at 0100 per MD order.  Patient has approx. 0730 OR time.  Thanks

## 2012-11-29 NOTE — Progress Notes (Signed)
ANTICOAGULATION CONSULT NOTE - Follow Up Consult  Pharmacy Consult for heparin Indication: Bridge for surgery (coumadin)  Allergies  Allergen Reactions  . Dilaudid (Hydromorphone Hcl) Shortness Of Breath  . Morphine And Related Shortness Of Breath  . Sulfa Antibiotics Other (See Comments)    "Made me drunk";  wobbly  . Keflex (Cephalexin) Nausea Only  . Levaquin (Levofloxacin) Hives and Rash    Patient Measurements: Height: 5\' 4"  (162.6 cm) Weight: 140 lb (63.504 kg) IBW/kg (Calculated) : 54.7 Heparin Dosing Weight: 63.5  Vital Signs: Temp: 98.4 F (36.9 C) (05/23 2208) Temp src: Oral (05/23 2208) BP: 110/55 mmHg (05/23 2208) Pulse Rate: 68 (05/23 2208)  Labs:  Recent Labs  11/27/12 0430 11/28/12 0557 11/29/12 0315  HGB 11.5* 11.3* 10.8*  HCT 34.6* 33.8* 32.3*  PLT 156 152 169  LABPROT 16.7* 21.4* 22.5*  INR 1.39 1.94* 2.08*  HEPARINUNFRC  --   --  0.39  CREATININE 0.86 0.83  --     Estimated Creatinine Clearance: 49.8 ml/min (by C-G formula based on Cr of 0.83).   Medical History: Past Medical History  Diagnosis Date  . GERD (gastroesophageal reflux disease)   . Glaucoma   . Cancer   . Hypertension   . Depression     Medications:  Scheduled:  . amLODipine  5 mg Oral Daily  . atorvastatin  10 mg Oral Daily  . buPROPion  300 mg Oral Daily  . calcium carbonate  1,250 mg Oral BID WC  . Chlorhexidine Gluconate Cloth  6 each Topical Q0600  . docusate sodium  100 mg Oral BID  . lactose free nutrition  237 mL Oral BID BM  . latanoprost  1 drop Both Eyes QHS  . mupirocin ointment  1 application Nasal BID  . sertraline  150 mg Oral Daily    Assessment: Pt for rt hip ORIF on 5/25. Coumadin for ADS and TIA has been discontinued. Bridge with heparin.  Heparin level therapeutic. Goal of Therapy:  Heparin level 0.3-0.7 units/ml Monitor platelets by anticoagulation protocol: Yes   Plan:  Cont heparin 900 units/hr Heparin level in 8 hours to confirm  therapeutic.  Talbert Cage Poteet 11/29/2012,3:47 AM

## 2012-11-29 NOTE — Progress Notes (Signed)
ANTICOAGULATION CONSULT NOTE - Follow Up Consult  Pharmacy Consult for heparin Indication: Bridge for surgery (coumadin)  Allergies  Allergen Reactions  . Dilaudid (Hydromorphone Hcl) Shortness Of Breath  . Morphine And Related Shortness Of Breath  . Sulfa Antibiotics Other (See Comments)    "Made me drunk";  wobbly  . Keflex (Cephalexin) Nausea Only  . Levaquin (Levofloxacin) Hives and Rash    Patient Measurements: Height: 5\' 4"  (162.6 cm) Weight: 140 lb (63.504 kg) IBW/kg (Calculated) : 54.7 Heparin Dosing Weight: 63.5  Vital Signs: Temp: 98.7 F (37.1 C) (05/24 0534) Temp src: Oral (05/24 0534) BP: 126/55 mmHg (05/24 0534) Pulse Rate: 74 (05/24 0534)  Labs:  Recent Labs  11/27/12 0430 11/28/12 0557 11/29/12 0315 11/29/12 1151  HGB 11.5* 11.3* 10.8*  --   HCT 34.6* 33.8* 32.3*  --   PLT 156 152 169  --   LABPROT 16.7* 21.4* 22.5*  --   INR 1.39 1.94* 2.08*  --   HEPARINUNFRC  --   --  0.39 0.66  CREATININE 0.86 0.83  --   --     Estimated Creatinine Clearance: 49.8 ml/min (by C-G formula based on Cr of 0.83).     Assessment: Pt for rt hip ORIF on Sunday 5/25. Coumadin for ASD and TIA has been discontinued. On bridge therapy with heparin.  Repeat  heparin level therapeutic at 0.66 on heparin 900 units/hr.  INR 2.08.  MD has ordered vitamin k 2.5 mg po today.  No bleeding reported.  H/H 10.8/32.3.   Goal of Therapy:  Heparin level 0.3-0.7 units/ml Monitor platelets by anticoagulation protocol: Yes   Plan:  MD ordered vitamin K 2.5 mg po this am Cont heparin 900 units/hr Daily HL/CBC while on heparin. To OR 5/25 for R hip ORIF. Herby Abraham, Pharm.D. [phone] 11/29/2012 12:33 PM

## 2012-11-30 ENCOUNTER — Encounter (HOSPITAL_COMMUNITY): Payer: Self-pay | Admitting: Anesthesiology

## 2012-11-30 ENCOUNTER — Inpatient Hospital Stay: Admit: 2012-11-30 | Payer: Self-pay | Admitting: Orthopedic Surgery

## 2012-11-30 ENCOUNTER — Inpatient Hospital Stay (HOSPITAL_COMMUNITY): Payer: Medicare Other

## 2012-11-30 ENCOUNTER — Encounter (HOSPITAL_COMMUNITY)
Admission: EM | Disposition: A | Discharge status: Rehabilitation Facility | Payer: Self-pay | Source: Home / Self Care | Attending: Internal Medicine

## 2012-11-30 ENCOUNTER — Inpatient Hospital Stay (HOSPITAL_COMMUNITY): Payer: Medicare Other | Admitting: Anesthesiology

## 2012-11-30 DIAGNOSIS — N390 Urinary tract infection, site not specified: Secondary | ICD-10-CM | POA: Diagnosis not present

## 2012-11-30 DIAGNOSIS — T84049A Periprosthetic fracture around unspecified internal prosthetic joint, initial encounter: Secondary | ICD-10-CM | POA: Diagnosis not present

## 2012-11-30 DIAGNOSIS — W19XXXA Unspecified fall, initial encounter: Secondary | ICD-10-CM | POA: Diagnosis not present

## 2012-11-30 DIAGNOSIS — Z96649 Presence of unspecified artificial hip joint: Secondary | ICD-10-CM | POA: Diagnosis not present

## 2012-11-30 DIAGNOSIS — IMO0002 Reserved for concepts with insufficient information to code with codable children: Secondary | ICD-10-CM | POA: Diagnosis not present

## 2012-11-30 DIAGNOSIS — Q211 Atrial septal defect: Secondary | ICD-10-CM | POA: Diagnosis not present

## 2012-11-30 DIAGNOSIS — I10 Essential (primary) hypertension: Secondary | ICD-10-CM | POA: Diagnosis not present

## 2012-11-30 DIAGNOSIS — S72309A Unspecified fracture of shaft of unspecified femur, initial encounter for closed fracture: Secondary | ICD-10-CM | POA: Diagnosis not present

## 2012-11-30 DIAGNOSIS — D62 Acute posthemorrhagic anemia: Secondary | ICD-10-CM | POA: Diagnosis not present

## 2012-11-30 DIAGNOSIS — S72409A Unspecified fracture of lower end of unspecified femur, initial encounter for closed fracture: Secondary | ICD-10-CM | POA: Diagnosis not present

## 2012-11-30 HISTORY — PX: ORIF FEMUR FRACTURE: SHX2119

## 2012-11-30 LAB — CREATININE, SERUM
Creatinine, Ser: 0.79 mg/dL (ref 0.50–1.10)
GFR calc non Af Amer: 79 mL/min — ABNORMAL LOW (ref >90)

## 2012-11-30 LAB — HEPARIN LEVEL (UNFRACTIONATED): Heparin Unfractionated: <0.1 IU/mL — ABNORMAL LOW (ref 0.30–0.70)

## 2012-11-30 LAB — PROTIME-INR
INR: 1.41 (ref 0.00–1.49)
Prothrombin Time: 16.9 seconds — ABNORMAL HIGH (ref 11.6–15.2)

## 2012-11-30 LAB — CBC
Hemoglobin: 11.5 g/dL — ABNORMAL LOW (ref 12.0–15.0)
RBC: 3.99 MIL/uL (ref 3.87–5.11)

## 2012-11-30 SURGERY — OPEN REDUCTION INTERNAL FIXATION (ORIF) DISTAL FEMUR FRACTURE
Anesthesia: Regional | Site: Hip | Laterality: Right | Wound class: Clean

## 2012-11-30 MED ORDER — NEOSTIGMINE METHYLSULFATE 1 MG/ML IJ SOLN
INTRAMUSCULAR | Status: DC | PRN
  Administered 2012-11-30: 3 mg via INTRAVENOUS

## 2012-11-30 MED ORDER — POTASSIUM CHLORIDE IN NACL 20-0.9 MEQ/L-% IV SOLN
INTRAVENOUS | Status: DC
  Administered 2012-11-30 – 2012-12-01 (×2): via INTRAVENOUS
  Administered 2012-12-01: 1000 mL via INTRAVENOUS
  Filled 2012-11-30 (×5): qty 1000

## 2012-11-30 MED ORDER — LIDOCAINE HCL 4 % MT SOLN
OROMUCOSAL | Status: DC | PRN
  Administered 2012-11-30: 4 mL via TOPICAL

## 2012-11-30 MED ORDER — WARFARIN - PHARMACIST DOSING INPATIENT
Freq: Every day | Status: DC
  Administered 2012-12-02: 18:00:00

## 2012-11-30 MED ORDER — ACETAMINOPHEN 10 MG/ML IV SOLN
INTRAVENOUS | Status: DC | PRN
  Administered 2012-11-30: 1000 mg via INTRAVENOUS

## 2012-11-30 MED ORDER — OXYCODONE HCL 5 MG/5ML PO SOLN: 5.0000 mg | Freq: Once | ORAL | Status: DC | PRN

## 2012-11-30 MED ORDER — BUPIVACAINE HCL (PF) 0.25 % IJ SOLN
INTRAMUSCULAR | Status: AC
  Filled 2012-11-30: qty 30

## 2012-11-30 MED ORDER — LIDOCAINE HCL (CARDIAC) 20 MG/ML IV SOLN
INTRAVENOUS | Status: DC | PRN
  Administered 2012-11-30: 10 mg via INTRAVENOUS

## 2012-11-30 MED ORDER — OXYCODONE HCL 5 MG PO TABS: 5.0000 mg | ORAL_TABLET | Freq: Once | ORAL | Status: DC | PRN

## 2012-11-30 MED ORDER — HYDROMORPHONE HCL PF 1 MG/ML IJ SOLN: 0.2500 mg | INTRAMUSCULAR | Status: DC | PRN

## 2012-11-30 MED ORDER — ACETAMINOPHEN 10 MG/ML IV SOLN
1000.0000 mg | Freq: Once | INTRAVENOUS | Status: AC
  Administered 2012-11-30: 1000 mg via INTRAVENOUS
  Filled 2012-11-30: qty 100

## 2012-11-30 MED ORDER — MIDAZOLAM HCL 5 MG/5ML IJ SOLN
INTRAMUSCULAR | Status: DC | PRN
  Administered 2012-11-30: 1 mg via INTRAVENOUS

## 2012-11-30 MED ORDER — PROPOFOL 10 MG/ML IV BOLUS
INTRAVENOUS | Status: DC | PRN
  Administered 2012-11-30: 110 mg via INTRAVENOUS
  Administered 2012-11-30: 30 mg via INTRAVENOUS

## 2012-11-30 MED ORDER — GLYCOPYRROLATE 0.2 MG/ML IJ SOLN
INTRAMUSCULAR | Status: DC | PRN
  Administered 2012-11-30: 0.4 mg via INTRAVENOUS

## 2012-11-30 MED ORDER — ENOXAPARIN SODIUM 40 MG/0.4ML ~~LOC~~ SOLN
40.0000 mg | SUBCUTANEOUS | Status: DC
  Administered 2012-12-01 – 2012-12-02 (×2): 40 mg via SUBCUTANEOUS
  Filled 2012-11-30 (×3): qty 0.4

## 2012-11-30 MED ORDER — ONDANSETRON HCL 4 MG/2ML IJ SOLN
INTRAMUSCULAR | Status: DC | PRN
  Administered 2012-11-30: 4 mg via INTRAVENOUS

## 2012-11-30 MED ORDER — LACTATED RINGERS IV SOLN
INTRAVENOUS | Status: DC | PRN
  Administered 2012-11-30 (×3): via INTRAVENOUS

## 2012-11-30 MED ORDER — MIDAZOLAM HCL 2 MG/2ML IJ SOLN: 0.5000 mg | Freq: Once | INTRAMUSCULAR | Status: DC | PRN

## 2012-11-30 MED ORDER — ROCURONIUM BROMIDE 100 MG/10ML IV SOLN
INTRAVENOUS | Status: DC | PRN
  Administered 2012-11-30: 40 mg via INTRAVENOUS
  Administered 2012-11-30: 10 mg via INTRAVENOUS

## 2012-11-30 MED ORDER — 0.9 % SODIUM CHLORIDE (POUR BTL) OPTIME
TOPICAL | Status: DC | PRN
  Administered 2012-11-30: 1000 mL

## 2012-11-30 MED ORDER — PROMETHAZINE HCL 25 MG/ML IJ SOLN: 6.2500 mg | INTRAMUSCULAR | Status: DC | PRN

## 2012-11-30 MED ORDER — WARFARIN SODIUM 5 MG PO TABS
5.0000 mg | ORAL_TABLET | Freq: Once | ORAL | Status: AC
  Administered 2012-11-30: 5 mg via ORAL
  Filled 2012-11-30: qty 1

## 2012-11-30 MED ORDER — DEXAMETHASONE SODIUM PHOSPHATE 4 MG/ML IJ SOLN
INTRAMUSCULAR | Status: DC | PRN
  Administered 2012-11-30: 4 mg via INTRAVENOUS

## 2012-11-30 MED ORDER — DEXTROSE 5 % IV SOLN
INTRAVENOUS | Status: DC | PRN
  Administered 2012-11-30: 08:00:00 via INTRAVENOUS

## 2012-11-30 MED ORDER — DEXAMETHASONE SODIUM PHOSPHATE 4 MG/ML IJ SOLN: INTRAMUSCULAR | Status: DC | PRN

## 2012-11-30 MED ORDER — BUPIVACAINE-EPINEPHRINE PF 0.25-1:200000 % IJ SOLN
INTRAMUSCULAR | Status: AC
  Filled 2012-11-30: qty 30

## 2012-11-30 MED ORDER — PHENYLEPHRINE HCL 10 MG/ML IJ SOLN
INTRAMUSCULAR | Status: DC | PRN
  Administered 2012-11-30 (×2): 80 ug via INTRAVENOUS
  Administered 2012-11-30: 40 ug via INTRAVENOUS
  Administered 2012-11-30 (×2): 80 ug via INTRAVENOUS

## 2012-11-30 MED ORDER — VANCOMYCIN HCL IN DEXTROSE 1-5 GM/200ML-% IV SOLN
1000.0000 mg | Freq: Two times a day (BID) | INTRAVENOUS | Status: AC
  Administered 2012-11-30: 1000 mg via INTRAVENOUS
  Filled 2012-11-30: qty 200

## 2012-11-30 MED ORDER — MEPERIDINE HCL 25 MG/ML IJ SOLN: 6.2500 mg | INTRAMUSCULAR | Status: DC | PRN

## 2012-11-30 MED ORDER — PHENYLEPHRINE HCL 10 MG/ML IJ SOLN: 10.0000 mg | INTRAVENOUS | Status: DC | PRN

## 2012-11-30 MED ORDER — DEXTROSE 5 % IV SOLN
10.0000 mg | INTRAVENOUS | Status: DC | PRN
  Administered 2012-11-30: 20 ug/min via INTRAVENOUS

## 2012-11-30 MED ORDER — FENTANYL CITRATE 0.05 MG/ML IJ SOLN
INTRAMUSCULAR | Status: DC | PRN
  Administered 2012-11-30: 100 ug via INTRAVENOUS
  Administered 2012-11-30 (×4): 50 ug via INTRAVENOUS

## 2012-11-30 SURGICAL SUPPLY — 66 items
BANDAGE ELASTIC 4 VELCRO ST LF (GAUZE/BANDAGES/DRESSINGS) ×2 IMPLANT
BANDAGE ELASTIC 6 VELCRO ST LF (GAUZE/BANDAGES/DRESSINGS) ×2 IMPLANT
BANDAGE GAUZE ELAST BULKY 4 IN (GAUZE/BANDAGES/DRESSINGS) IMPLANT
BIT DRILL 4.3 (BIT) ×2 IMPLANT
BLADE SURG ROTATE 9660 (MISCELLANEOUS) IMPLANT
BNDG COHESIVE 4X5 TAN STRL (GAUZE/BANDAGES/DRESSINGS) ×2 IMPLANT
BRUSH SCRUB DISP (MISCELLANEOUS) IMPLANT
BUTTON HEX 3.5 (Orthopedic Implant) ×2 IMPLANT
CABLE (Orthopedic Implant) ×4 IMPLANT
CANISTER SUCTION 2500CC (MISCELLANEOUS) ×2 IMPLANT
CATH FOLEY 2WAY SLVR  5CC 14FR (CATHETERS) ×1
CATH FOLEY 2WAY SLVR 5CC 14FR (CATHETERS) ×1 IMPLANT
CLOTH BEACON ORANGE TIMEOUT ST (SAFETY) ×2 IMPLANT
DRAPE C-ARM 42X72 X-RAY (DRAPES) ×2 IMPLANT
DRAPE ORTHO SPLIT 77X108 STRL (DRAPES) ×4
DRAPE SURG ORHT 6 SPLT 77X108 (DRAPES) ×2 IMPLANT
DRAPE U-SHAPE 47X51 STRL (DRAPES) ×2 IMPLANT
DRILL BIT 4.3 (BIT) ×4
DRSG ADAPTIC 3X8 NADH LF (GAUZE/BANDAGES/DRESSINGS) IMPLANT
DRSG PAD ABDOMINAL 8X10 ST (GAUZE/BANDAGES/DRESSINGS) IMPLANT
ELECT REM PT RETURN 9FT ADLT (ELECTROSURGICAL) ×2
ELECTRODE REM PT RTRN 9FT ADLT (ELECTROSURGICAL) ×1 IMPLANT
EVACUATOR 1/8 PVC DRAIN (DRAIN) IMPLANT
FACESHIELD LNG OPTICON STERILE (SAFETY) ×2 IMPLANT
GAUZE XEROFORM 5X9 LF (GAUZE/BANDAGES/DRESSINGS) ×2 IMPLANT
GLOVE BIOGEL PI IND STRL 8.5 (GLOVE) ×2 IMPLANT
GLOVE BIOGEL PI INDICATOR 8.5 (GLOVE) ×2
GLOVE SURG ORTHO 8.0 STRL STRW (GLOVE) ×4 IMPLANT
GOWN STRL NON-REIN LRG LVL3 (GOWN DISPOSABLE) ×6 IMPLANT
HEX DRIVE 2.5 (BIT) ×4 IMPLANT
KIT BASIN OR (CUSTOM PROCEDURE TRAY) ×2 IMPLANT
KIT ROOM TURNOVER OR (KITS) ×2 IMPLANT
MANIFOLD NEPTUNE II (INSTRUMENTS) IMPLANT
NEEDLE 22X1 1/2 (OR ONLY) (NEEDLE) IMPLANT
NEEDLE HYPO 25GX1X1/2 BEV (NEEDLE) ×2 IMPLANT
NS IRRIG 1000ML POUR BTL (IV SOLUTION) ×2 IMPLANT
PACK TOTAL JOINT (CUSTOM PROCEDURE TRAY) ×2 IMPLANT
PAD ARMBOARD 7.5X6 YLW CONV (MISCELLANEOUS) ×6 IMPLANT
PAD CAST 4YDX4 CTTN HI CHSV (CAST SUPPLIES) ×1 IMPLANT
PADDING CAST COTTON 4X4 STRL (CAST SUPPLIES) ×2
PADDING CAST COTTON 6X4 STRL (CAST SUPPLIES) ×2 IMPLANT
PLATE FEMUR SHAFT NCB (Plate) ×2 IMPLANT
SCREW 5.0 32MM (Screw) ×4 IMPLANT
SCREW CORTICAL 5.0X10MM (Screw) ×2 IMPLANT
SCREW NCB 5.0X30MM (Screw) ×4 IMPLANT
SCREW UNI 5.0 12MM (Screw) ×6 IMPLANT
SCREW UNI CORTICAL 5.0X14MM (Screw) ×6 IMPLANT
SCREW UNICORTICAL 5.0X14 (Screw) ×3 IMPLANT
SPONGE GAUZE 4X4 12PLY (GAUZE/BANDAGES/DRESSINGS) ×2 IMPLANT
SPONGE LAP 18X18 X RAY DECT (DISPOSABLE) ×4 IMPLANT
STAPLER VISISTAT 35W (STAPLE) ×2 IMPLANT
SUCTION FRAZIER TIP 10 FR DISP (SUCTIONS) IMPLANT
SUT PROLENE 0 CT 2 (SUTURE) IMPLANT
SUT VIC AB 0 CT1 27 (SUTURE) ×4
SUT VIC AB 0 CT1 27XBRD ANBCTR (SUTURE) ×2 IMPLANT
SUT VIC AB 1 CT1 27 (SUTURE) ×4
SUT VIC AB 1 CT1 27XBRD ANBCTR (SUTURE) ×2 IMPLANT
SUT VIC AB 2-0 CT1 27 (SUTURE) ×4
SUT VIC AB 2-0 CT1 TAPERPNT 27 (SUTURE) ×2 IMPLANT
SYR 20ML ECCENTRIC (SYRINGE) IMPLANT
SYR CONTROL 10ML LL (SYRINGE) ×2 IMPLANT
TOWEL OR 17X24 6PK STRL BLUE (TOWEL DISPOSABLE) ×2 IMPLANT
TOWEL OR 17X26 10 PK STRL BLUE (TOWEL DISPOSABLE) ×4 IMPLANT
TRAY FOLEY CATH 14FR (SET/KITS/TRAYS/PACK) ×4 IMPLANT
WATER STERILE IRR 1000ML POUR (IV SOLUTION) IMPLANT
cable button 2.5mm hex drive ×2 IMPLANT

## 2012-11-30 NOTE — Anesthesia Preprocedure Evaluation (Addendum)
Anesthesia Evaluation  Patient identified by MRN, date of birth, ID band Patient awake    Reviewed: Allergy & Precautions, H&P , NPO status , Patient's Chart, lab work & pertinent test results  History of Anesthesia Complications Negative for: history of anesthetic complications  Airway Mallampati: III TM Distance: >3 FB Neck ROM: Full    Dental  (+) Teeth Intact and Dental Advisory Given   Pulmonary neg pulmonary ROS,  breath sounds clear to auscultation  Pulmonary exam normal       Cardiovascular hypertension, Pt. on medications + dysrhythmias (Currently NSR. INR 1.4) Atrial Fibrillation Rhythm:Regular Rate:Normal  ASD diagnosed 2004. Last coumadin 5/215/25 INR 1.25   Neuro/Psych Depression TIA 1997, 2004 Slight memory problems. TIA   GI/Hepatic Neg liver ROS, GERD-  Medicated and Controlled,  Endo/Other  negative endocrine ROS  Renal/GU negative Renal ROS     Musculoskeletal   Abdominal   Peds  Hematology   Anesthesia Other Findings Breast cancer  Reproductive/Obstetrics negative OB ROS                        Anesthesia Physical Anesthesia Plan  ASA: III  Anesthesia Plan: General   Post-op Pain Management:    Induction: Intravenous  Airway Management Planned: Oral ETT  Additional Equipment:   Intra-op Plan:   Post-operative Plan: Extubation in OR  Informed Consent: I have reviewed the patients History and Physical, chart, labs and discussed the procedure including the risks, benefits and alternatives for the proposed anesthesia with the patient or authorized representative who has indicated his/her understanding and acceptance.   Dental advisory given  Plan Discussed with: CRNA and Surgeon  Anesthesia Plan Comments: (Plan routine monitors, GETA )        Anesthesia Quick Evaluation

## 2012-11-30 NOTE — Progress Notes (Signed)
Patient ID: Renee Adams  female  ZOX:[id number]    DOB: 08-22-1933    DOA: 11/25/2012  PCP: Benita Stabile, MD  Assessment/Plan: Principal Problem:   Fracture of femoral shaft, right, closed- patient seen after surgery today - Ortho following, Right hip ORIF today.  - Management per orthopedics - Start Coumadin back once okay with orthopedics  - Will start PT tomorrow  Active Problems:   Essential hypertension, benign- stable   UTI  -Completed the course of antibiotics   History of recurrent TIAs, ASD:  - Hold Coumadin, was placed on heparin drip prior to surgery, await orthopedics decision when to start back Coumadin?  Constipation: Place on Dulcolax suppository, MiraLax  DVT Prophylaxis:  Code Status:  Disposition: may need rehabilitation, patient lives alone     Subjective: Patient seen after the surgery, doing well, pain controlled, family members at bedside   Objective: Weight change:   Intake/Output Summary (Last 24 hours) at 11/30/12 1315 Last data filed at 11/30/12 1105  Gross per 24 hour  Intake   3724 ml  Output   1050 ml  Net   2674 ml   Blood pressure 128/57, pulse 83, temperature 98.2 F (36.8 C), temperature source Oral, resp. rate 16, height 5\' 4"  (1.626 m), weight 63.504 kg (140 lb), SpO2 97.00%.  Physical Exam: General: A x O x3, NAD CVS: S1-S2 clear, no murmur rubs or gallops Chest: CTAB Abdomen: soft NT, ND, NBS  Extremities: no c/c/e    Lab Results: Basic Metabolic Panel:  Recent Labs Lab 11/27/12 0430 11/28/12 0557 11/30/12 0535  NA 138 139  --   K 3.7 3.6  --   CL 106 108  --   CO2 19 21  --   GLUCOSE 79 81  --   BUN 16 15  --   CREATININE 0.86 0.83 0.79  CALCIUM 9.0 9.0  --    CBC:  Recent Labs Lab 11/25/12 2040  11/29/12 0315 11/30/12 0535  WBC 7.0  < > 6.6 5.8  NEUTROABS 5.3  --   --   --   HGB 14.4  < > 10.8* 11.5*  HCT 41.5  < > 32.3* 34.8*  MCV 86.1  < > 87.1 87.2  PLT 174  < > 169 193  < > =  values in this interval not displayed. Cardiac Enzymes: No results found for this basename: CKTOTAL, CKMB, CKMBINDEX, TROPONINI,  in the last 168 hours BNP: No components found with this basename: POCBNP,  CBG: No results found for this basename: GLUCAP,  in the last 168 hours   Micro Results: Recent Results (from the past 240 hour(s))  URINE CULTURE     Status: None   Collection Time    11/25/12 10:39 PM      Result Value Range Status   Specimen Description URINE, CATHETERIZED   Final   Special Requests NONE   Final   Culture  Setup Time 11/26/2012 09:31   Final   Colony Count NO GROWTH   Final   Culture NO GROWTH   Final   Report Status 11/27/2012 FINAL   Final  SURGICAL PCR SCREEN     Status: Abnormal   Collection Time    11/26/12  4:43 AM      Result Value Range Status   MRSA, PCR POSITIVE (*) NEGATIVE Final   Comment: RESULT CALLED TO, READ BACK BY AND VERIFIED WITH:     Verline Lema RN [phone] Guy Begin, S  Staphylococcus aureus POSITIVE (*) NEGATIVE Final   Comment:            The Xpert SA Assay (FDA     approved for NASAL specimens     in patients over 98 years of age),     is one component of     a comprehensive surveillance     program.  Test performance has     been validated by [employer] for patients greater     than or equal to 59 year old.     It is not intended     to diagnose infection nor to     guide or monitor treatment.     RESULT CALLED TO, READ BACK BY AND VERIFIED WITH:     Verline Lema RN [id number] [phone] [facility] [place], S    Studies/Results: Dg Hip Complete Left  11/25/2012   *RADIOLOGY REPORT*  Clinical Data: Larey Seat.  Left hip pain.  LEFT HIP - COMPLETE 2+ VIEW  Comparison: 09/15/2011.  Findings: The acetabular Insert is likely loose as suggested on the prior examination.  No acute fracture or femoral head dislocation.  IMPRESSION: No acute fracture.   Original Report Authenticated By: Rudie Meyer, M.D.   Dg Hip Complete  Right  11/25/2012   *RADIOLOGY REPORT*  Clinical Data: Larey Seat.  Bilateral hip pain.  RIGHT HIP - COMPLETE 2+ VIEW  Comparison: 09/25/2011.  Findings: Bilateral hip prosthesis appears stable.  The acetabular Insert is likely loose as suggested on the prior study.  No femoral head dislocation. There is a lateral cortical fracture near the tip of the right prosthesis.  Stable areas of heterotopic ossification.  IMPRESSION: Lateral cortical fracture near the tip of the right femoral prosthesis.   Original Report Authenticated By: Rudie Meyer, M.D.   Dg Femur Right  11/25/2012   *RADIOLOGY REPORT*  Clinical Data: Larey Seat.  Right hip pain.  RIGHT FEMUR - 2 VIEW  Comparison: Earlier right hip films.  Findings:  Again demonstrated is a oblique coursing fracture in the distal tip of the right femoral prosthesis without displacement. The distal femoral prosthesis is intact.  No distal femur fracture.  IMPRESSION: Nondisplaced oblique coursing lateral cortical fracture of the proximal femur near the tip of the prosthesis.   Original Report Authenticated By: Rudie Meyer, M.D.   Ct Head Wo Contrast  11/25/2012   *RADIOLOGY REPORT*  Clinical Data: Larey Seat.  Hit head.  CT HEAD WITHOUT CONTRAST  Technique:  Contiguous axial images were obtained from the base of the skull through the vertex without contrast.  Comparison: 11/26/2008.  Findings: Stable area of encephalomalacia in the left high parietal area likely due to prior infarction.  The ventricles are normal and stable.  No extra-axial fluid collections are identified.  No CT findings for acute hemispheric infarction and/or intracranial hemorrhage.  No mass lesions.  The brainstem and cerebellum grossly normal and stable.  The bony structures are intact.  No acute skull fracture.  The paranasal sinuses and mastoid air cells are clear.  The globes are intact.  IMPRESSION:  1.  Remote left parietal infarct. 2.  No acute intracranial findings for acute skull fracture.   Original  Report Authenticated By: Rudie Meyer, M.D.    Medications: Scheduled Meds: . amLODipine  5 mg Oral Daily  . atorvastatin  10 mg Oral Daily  . bisacodyl  10 mg Rectal Once  . buPROPion  300 mg Oral Daily  . calcium carbonate  1,250 mg  Oral BID WC  . docusate sodium  100 mg Oral BID  . [START ON 12/01/2012] enoxaparin (LOVENOX) injection  40 mg Subcutaneous Q24H  . lactose free nutrition  237 mL Oral BID BM  . latanoprost  1 drop Both Eyes QHS  . mupirocin ointment  1 application Nasal BID  . sertraline  150 mg Oral Daily  . vancomycin  1,000 mg Intravenous Q12H      LOS: 5 days   Lonni Dirden M.D. [facility] 11/30/2012, 1:15 PM Pager: [phone]  If 7PM-7AM, please contact night-coverage [email or web address] Password [email or web address]

## 2012-11-30 NOTE — Anesthesia Procedure Notes (Signed)
Procedure Name: Intubation Date/Time: 11/30/2012 7:58 AM Performed by: Lovie Chol Pre-anesthesia Checklist: Patient identified, Emergency Drugs available, Suction available, Patient being monitored and Timeout performed Patient Re-evaluated:Patient Re-evaluated prior to inductionOxygen Delivery Method: Circle system utilized Preoxygenation: Pre-oxygenation with 100% oxygen Intubation Type: IV induction Ventilation: Mask ventilation without difficulty and Oral airway inserted - appropriate to patient size Laryngoscope Size: Miller and 2 Grade View: Grade I Tube type: Oral Tube size: 7.0 mm Number of attempts: 1 Airway Equipment and Method: Stylet and LTA kit utilized Placement Confirmation: ETT inserted through vocal cords under direct vision,  positive ETCO2,  CO2 detector and breath sounds checked- equal and bilateral Secured at: 22 cm Tube secured with: Tape Dental Injury: Teeth and Oropharynx as per pre-operative assessment

## 2012-11-30 NOTE — Progress Notes (Signed)
ANTICOAGULATION CONSULT NOTE - Follow Up Consult  Pharmacy Consult for coumadin Indication: ASD, recurrent TIAs, s/p ORIF femur fx 5/25  Allergies  Allergen Reactions  . Dilaudid (Hydromorphone Hcl) Shortness Of Breath  . Morphine And Related Shortness Of Breath  . Sulfa Antibiotics Other (See Comments)    "Made me drunk";  wobbly  . Keflex (Cephalexin) Nausea Only  . Levaquin (Levofloxacin) Hives and Rash    Patient Measurements: Height: 5\' 4"  (162.6 cm) Weight: 140 lb (63.504 kg) IBW/kg (Calculated) : 54.7 Heparin Dosing Weight: 63.5  Vital Signs: Temp: 98.2 F (36.8 C) (05/25 1235) Temp src: Oral (05/25 1235) BP: 128/57 mmHg (05/25 1235) Pulse Rate: 83 (05/25 1201)  Labs:  Recent Labs  11/28/12 0557 11/29/12 0315 11/29/12 1151 11/30/12 0535  HGB 11.3* 10.8*  --  11.5*  HCT 33.8* 32.3*  --  34.8*  PLT 152 169  --  193  LABPROT 21.4* 22.5*  --  16.9*  INR 1.94* 2.08*  --  1.41  HEPARINUNFRC  --  0.39 0.66 <0.10*  CREATININE 0.83  --   --  0.79    Estimated Creatinine Clearance: 51.7 ml/min (by C-G formula based on Cr of 0.79).     Assessment: Ms. Vesely is s/p  rt femur ORIF today,  Sunday 5/25. She was on coumadin for ASD and recurrent TIAs PTA.  She was on bridge therapy with heparin prior to surgery and post op she is on LMWH 40 qday and coumadin is being resumed. She was given vitamin k 2.5 mg po 5/24 at 1700.  Her INR this am is 1.41.  Her CBC is stable.   Her home dose of coumadin is 3 mg alternating with 2 mg.   Goal of Therapy:  INR 2-3 Monitor platelets by anticoagulation protocol: Yes   Plan:  1. Coumadin 5 mg po x 1 dose today 2. Daily INR 3. LMWH 40 qday to be stopped when INR > = 1.8 Herby Abraham, Pharm.D. [phone] 11/30/2012 1:25 PM

## 2012-11-30 NOTE — Anesthesia Postprocedure Evaluation (Signed)
  Anesthesia Post-op Note  Patient: Renee Adams  Procedure(s) Performed: Procedure(s): OPEN REDUCTION INTERNAL FIXATION (ORIF) DISTAL FEMUR FRACTURE (Right)  Patient Location: PACU  Anesthesia Type:General  Level of Consciousness: awake, alert  and patient cooperative  Airway and Oxygen Therapy: Patient Spontanous Breathing and Patient connected to nasal cannula oxygen  Post-op Pain: none  Post-op Assessment: Post-op Vital signs reviewed, Patient's Cardiovascular Status Stable, Respiratory Function Stable, Patent Airway, No signs of Nausea or vomiting and Pain level controlled  Post-op Vital Signs: Reviewed and stable  Complications: No apparent anesthesia complications

## 2012-11-30 NOTE — Transfer of Care (Signed)
Immediate Anesthesia Transfer of Care Note  Patient: Renee Adams  Procedure(s) Performed: Procedure(s): OPEN REDUCTION INTERNAL FIXATION (ORIF) DISTAL FEMUR FRACTURE (Right)  Patient Location: PACU  Anesthesia Type:General  Level of Consciousness: awake, oriented and patient cooperative  Airway & Oxygen Therapy: Patient Spontanous Breathing and Patient connected to face mask oxygen  Post-op Assessment: Report given to PACU RN and Post -op Vital signs reviewed and stable  Post vital signs: Reviewed and stable  Complications: No apparent anesthesia complications

## 2012-11-30 NOTE — Preoperative (Signed)
Beta Blockers   Reason not to administer Beta Blockers:Not Applicable 

## 2012-12-01 DIAGNOSIS — Y92009 Unspecified place in unspecified non-institutional (private) residence as the place of occurrence of the external cause: Secondary | ICD-10-CM | POA: Diagnosis not present

## 2012-12-01 DIAGNOSIS — C50919 Malignant neoplasm of unspecified site of unspecified female breast: Secondary | ICD-10-CM

## 2012-12-01 DIAGNOSIS — S72309A Unspecified fracture of shaft of unspecified femur, initial encounter for closed fracture: Secondary | ICD-10-CM | POA: Diagnosis not present

## 2012-12-01 DIAGNOSIS — I10 Essential (primary) hypertension: Secondary | ICD-10-CM | POA: Diagnosis not present

## 2012-12-01 LAB — COMPREHENSIVE METABOLIC PANEL
AST: 43 U/L — ABNORMAL HIGH (ref 0–37)
Albumin: 2.6 g/dL — ABNORMAL LOW (ref 3.5–5.2)
Alkaline Phosphatase: 57 U/L (ref 39–117)
Chloride: 108 mEq/L (ref 96–112)
Creatinine, Ser: 0.87 mg/dL (ref 0.50–1.10)
Potassium: 4 mEq/L (ref 3.5–5.1)
Total Bilirubin: 0.4 mg/dL (ref 0.3–1.2)
Total Protein: 5.3 g/dL — ABNORMAL LOW (ref 6.0–8.3)

## 2012-12-01 LAB — PROTIME-INR: INR: 1.21 (ref 0.00–1.49)

## 2012-12-01 MED ORDER — WARFARIN SODIUM 5 MG PO TABS
5.0000 mg | ORAL_TABLET | Freq: Once | ORAL | Status: DC
  Administered 2012-12-01: 5 mg via ORAL
  Filled 2012-12-01: qty 1

## 2012-12-01 NOTE — Progress Notes (Signed)
Rehab Admissions Coordinator Note:  Patient was screened by Trish Mage for appropriateness for an Inpatient Acute Rehab Consult.  Noted PT recommending CIR.  At this time, we are recommending Inpatient Rehab consult.  Trish Mage 12/01/2012, 1:33 PM  I can be reached at [phone].

## 2012-12-01 NOTE — Evaluation (Signed)
Occupational Therapy Evaluation Patient Details Name: Renee Adams MRN: [id number] DOB: [birth year] Today's Date: 12/01/2012 Time: 4098-1191 OT Time Calculation (min): 28 min  OT Assessment / Plan / Recommendation Clinical Impression    Pt s/p postop after ORIF of femur fracture; fracture was located at distal part of pt femoral prosthesis of previous THA; Presents with below problem list; Will benefit from OT to maximize independence and safety to enable safe dc home with prn assist from Daughter; Pt would rather dc to home with her daughter's 24 hour assist and HH therapies to follow-up; Pt agreeable to CIR reviewing her case. If her progress is slow, I believe she will greatly benefit from a CIR stay to maximize independence and safety; Will request a CIR screen.      OT Assessment  Patient needs continued OT Services    Follow Up Recommendations  CIR (if pt makes great progress, may be able to d/c home)   Barriers to Discharge      Equipment Recommendations  Other (comment) (tbd)    Recommendations for Other Services Rehab consult  Frequency  Min 2X/week    Precautions / Restrictions Precautions Precautions: Posterior Hip;Fall Precaution Comments: Bilateral Posterior Hip precautions Restrictions Weight Bearing Restrictions: Yes RLE Weight Bearing: Weight bearing as tolerated Other Position/Activity Restrictions: OK for Hip ROM (within post prec), and no restrictions on knee ROM   Pertinent Vitals/Pain Pain 9/10. RN gave IV pain meds     ADL  Eating/Feeding: Independent Where Assessed - Eating/Feeding: Chair Grooming: Set up Where Assessed - Grooming: Supported sitting Upper Body Bathing: Set up Where Assessed - Upper Body Bathing: Supported sitting Lower Body Bathing: +2 Total assistance Lower Body Bathing: Patient Percentage: 10% Where Assessed - Lower Body Bathing: Supported sit to stand Upper Body Dressing: Set up Where Assessed - Upper Body Dressing:  Supported sitting Lower Body Dressing: +2 Total assistance Lower Body Dressing: Patient Percentage: 10% Where Assessed - Lower Body Dressing: Supported sit to Pharmacist, hospital: +2 Total assistance Toilet Transfer: Patient Percentage: 40% Toilet Transfer Method: Sit to Barista: Other (comment) (from bed to recliner chair) Toileting - Clothing Manipulation and Hygiene: +2 Total assistance Toileting - Clothing Manipulation and Hygiene: Patient Percentage: 0% Where Assessed - Toileting Clothing Manipulation and Hygiene: Other (comment) (sit to stand from bed) Tub/Shower Transfer Method: Not assessed Equipment Used: Gait belt;Rolling walker Transfers/Ambulation Related to ADLs: Sit to stand transfer +2 total A (40%).  ADL Comments: Pt at Total A level for LB ADLs and has bilateral hip precautions to maintain.  Session very limited due to pain.    OT Diagnosis: Acute pain  OT Problem List: Decreased strength;Decreased range of motion;Decreased activity tolerance;Impaired balance (sitting and/or standing);Decreased knowledge of use of DME or AE;Decreased knowledge of precautions;Pain OT Treatment Interventions: Self-care/ADL training;DME and/or AE instruction;Therapeutic activities;Patient/family education;Balance training   OT Goals Acute Rehab OT Goals OT Goal Formulation: With patient Time For Goal Achievement: 12/08/12 Potential to Achieve Goals: Good ADL Goals Pt Will Perform Grooming: with modified independence;Standing at sink ADL Goal: Grooming - Progress: Goal set today Pt Will Perform Lower Body Bathing: with modified independence;Sit to stand from chair;with adaptive equipment ADL Goal: Lower Body Bathing - Progress: Goal set today Pt Will Perform Lower Body Dressing: with modified independence;Sit to stand from bed;Sit to stand from chair;with adaptive equipment ADL Goal: Lower Body Dressing - Progress: Goal set today Pt Will Transfer to Toilet: with  modified independence;Ambulation ADL Goal: Toilet Transfer - Progress:  Goal set today Pt Will Perform Toileting - Clothing Manipulation: with modified independence;Standing ADL Goal: Toileting - Clothing Manipulation - Progress: Goal set today Pt Will Perform Toileting - Hygiene: with modified independence;Sit to stand from 3-in-1/toilet;Sitting on 3-in-1 or toilet ADL Goal: Toileting - Hygiene - Progress: Goal set today Pt Will Perform Tub/Shower Transfer: Shower transfer;with modified independence;Ambulation ADL Goal: Tub/Shower Transfer - Progress: Goal set today Miscellaneous OT Goals Miscellaneous OT Goal #1: Pt will be able to verbalize and demonstrate 3/3 hip precautions independently. OT Goal: Miscellaneous Goal #1 - Progress: Goal set today  Visit Information  Last OT Received On: 12/01/12 Assistance Needed: +2 PT/OT Co-Evaluation/Treatment: Yes    Subjective Data      Prior Functioning     Home Living Lives With: Alone Available Help at Discharge: Family;Available 24 hours/day Type of Home: House Home Access: Stairs to enter [employer] of Steps: 1 Entrance Stairs-Rails: None Home Layout: One level (1 step to get into den) [employer] Shower/Tub: [employer];Door Bathroom Toilet: Handicapped height Home Adaptive Equipment: Walker - rolling;Raised toilet seat with rails;Reacher;Sock aid Prior Function Level of Independence: Independent Able to Take Stairs?: Yes Driving: Yes Vocation: Retired Musician: HOH         Vision/Perception     Copywriter, advertising Arousal/Alertness: Awake/alert Behavior During Therapy: WFL for tasks assessed/performed Overall Cognitive Status: Within Functional Limits for tasks assessed    Extremity/Trunk Assessment Right Upper Extremity Assessment RUE ROM/Strength/Tone: [facility] for tasks assessed Left Upper Extremity Assessment LUE ROM/Strength/Tone: WFL for tasks assessed  Mobility Bed  Mobility Bed Mobility: Supine to Sit;Sitting - Scoot to Edge of Bed Supine to Sit: 1: +2 Total assist Supine to Sit: Patient Percentage: 40% Sitting - Scoot to Edge of Bed: 1: +2 Total assist Sitting - Scoot to Edge of Bed: Patient Percentage: 30% Details for Bed Mobility Assistance: cues and extensive physical assist needed during transitions; Very painful, and had to move quite slowly; Assist needed for LE movement to EOB and to elevate trunk from bed Transfers Transfers: Sit to Stand;Stand to Sit Sit to Stand: 1: +2 Total assist;With upper extremity assist;From bed Sit to Stand: Patient Percentage: 40% Stand to Sit: 1: +2 Total assist;With upper extremity assist;To chair/3-in-1 Stand to Sit: Patient Percentage: 40% Details for Transfer Assistance: Requiring extensive assist for liftoff with sit to stand; cues for technique and prec; Unsuccessful first attempt due to posterior lean (likely due to anxiety); Cues for hand placement, but decided to have pt use RW to pull up and had more physical assist to steady rW as well; Attempted to pivot to cahir, but pt with difficulty bearing any weight through RLE, and therefore moved bed out and pulled chair up to pt     Exercise     Balance     End of Session OT - End of Session Equipment Utilized During Treatment: Gait belt Activity Tolerance: Patient limited by pain Patient left: in chair;with call bell/phone within reach Nurse Communication: Patient requests pain meds  GO     Earlie Raveling OTR/L [phone] 12/01/2012, 1:55 PM

## 2012-12-01 NOTE — Progress Notes (Addendum)
Physical Therapy Evaluation Patient Details Name: Renee Adams MRN: [id number] DOB: February 24, 1934 Today's Date: 12/01/2012 Time: 4098-1191 PT Time Calculation (min): 30 min  PT Assessment / Plan / Recommendation Clinical Impression  Now seeing Renee Adams postop after ORIF of femur fracture; fracture was located at distal part of pt femoral prosthesis of previous THA; Presents with decr functional mobility; Will benefit from PT to maximize independence and safety with mobility and enable safe dc home with prn assist from Daughter;   While moving and transfers were quite difficult for Renee Adams today, she clearly would rather dc to home with her daughter's 24 hour assist and HHtherapies to follow-up; Still, she is aware of the need to be able to move better, and be more independent functionally to dc home;   Renee Adams is amenable to [facility] reviewing her case; If her progress is slow, I beleive she will greatly benefit from a CIR stay to maximize independence and safety with mobilty; Will request a CIR screen; Updated Skip Mayer, PA    PT Assessment  Patient needs continued PT services    Follow Up Recommendations  CIR (If pt makes excellent progress, may be able to dc home)    Does the patient have the potential to tolerate intense rehabilitation      Barriers to Discharge None      Equipment Recommendations  Other (comment) (may need to consider wheelchair)    Recommendations for Other Services Other (comment) (Rehab Screen)   Frequency Min 6X/week    Precautions / Restrictions Precautions Precautions: Posterior Hip;Fall Precaution Comments: Bilateral Posterior Hip precautions Restrictions Weight Bearing Restrictions: Yes RLE Weight Bearing: Weight bearing as tolerated Other Position/Activity Restrictions: OK for Hip ROM (within post prec), and no restrictions on knee ROM   Pertinent Vitals/Pain 9/10 Pin; RN gave IV pain meds      Mobility  Bed  Mobility Bed Mobility: Supine to Sit;Sitting - Scoot to Edge of Bed Supine to Sit: 1: +2 Total assist Supine to Sit: Patient Percentage: 40% Sitting - Scoot to Edge of Bed: 1: +2 Total assist Sitting - Scoot to Edge of Bed: Patient Percentage: 30% Details for Bed Mobility Assistance: cues and extensive physical assist needed during transitions; Very painful, and had to move quite slowly; Assist needed for LE movement to EOB and to elevate trunk from bed Transfers Transfers: Sit to Stand;Stand to Sit;Stand Pivot Transfers Sit to Stand: 1: +2 Total assist;With upper extremity assist;From bed Sit to Stand: Patient Percentage: 40% Stand to Sit: 1: +2 Total assist;With upper extremity assist;To chair/3-in-1 Stand to Sit: Patient Percentage: 40% Stand Pivot Transfers:  (Attempted, but pt quite painful and unable) Details for Transfer Assistance: Requiring extensive assist for liftoff with sit to stand; cues for technique and prec; Unsuccessful first attempt due to posterior lean (likely due to anxiety); Cues for hand placement, but decided to have pt use RW to pull up and had more physical assist to steady rW as well; Attempted to pivot to cahir, but pt with difficulty bearing any weight through RLE, and therefore moved bed out and pulled chair up to pt Ambulation/Gait Ambulation/Gait Assistance: Not tested (comment)    Exercises     PT Diagnosis: Difficulty walking;Acute pain  PT Problem List: Decreased strength;Decreased range of motion;Decreased activity tolerance;Decreased balance;Decreased mobility;Decreased knowledge of use of DME;Decreased knowledge of precautions;Pain PT Treatment Interventions: DME instruction;Gait training;Stair training;Functional mobility training;Therapeutic activities;Therapeutic exercise;Patient/family education   PT Goals Acute Rehab PT Goals Time For Goal  Achievement: 12/15/12 Potential to Achieve Goals: Good Pt will go Supine/Side to Sit: with supervision PT  Goal: Supine/Side to Sit - Progress: Goal set today Pt will go Sit to Supine/Side: with supervision PT Goal: Sit to Supine/Side - Progress: Goal set today Pt will go Sit to Stand: with supervision PT Goal: Sit to Stand - Progress: Goal set today Pt will go Stand to Sit: with supervision PT Goal: Stand to Sit - Progress: Goal set today Pt will Transfer Bed to Chair/Chair to Bed: with supervision PT Transfer Goal: Bed to Chair/Chair to Bed - Progress: Goal set today Pt will Ambulate: 1 - 15 feet;with supervision;with rolling walker;Other (comment) PT Goal: Ambulate - Progress: Goal set today Pt will Go Up / Down Stairs: 1-2 stairs;with rolling walker;with min assist PT Goal: Up/Down Stairs - Progress: Goal set today  Visit Information  Last PT Received On: 12/01/12 Assistance Needed: +2    Subjective Data  Subjective: Agreeable to getting up Patient Stated Goal: Would very much want to dc home with daughter   Prior Functioning  Home Living Lives With: Alone Available Help at Discharge: Family;Available 24 hours/day Type of Home: House Home Access: Stairs to enter [employer] of Steps: 1 Entrance Stairs-Rails: None Home Layout: One level (1 step to get into den) [employer] Shower/Tub: [employer];Door Bathroom Toilet: Handicapped height Home Adaptive Equipment: Walker - rolling;Raised toilet seat with rails;Reacher;Sock aid Prior Function Level of Independence: Independent Able to Take Stairs?: Yes Driving: Yes Vocation: Retired Musician: Surveyor, mining Arousal/Alertness: Awake/alert Behavior During Therapy: WFL for tasks assessed/performed Overall Cognitive Status: Within Functional Limits for tasks assessed    Extremity/Trunk Assessment Right Upper Extremity Assessment RUE ROM/Strength/Tone: WFL for tasks assessed Left Upper Extremity Assessment LUE ROM/Strength/Tone: WFL for tasks assessed Right Lower Extremity  Assessment RLE ROM/Strength/Tone: Deficits RLE ROM/Strength/Tone Deficits: Decr AROM and strength, limited by pain postop; Able to activate quad for quad setting therex Left Lower Extremity Assessment LLE ROM/Strength/Tone: Deficits LLE ROM/Strength/Tone Deficits: Noted generalized weakness, as pt required physical assist for sit to stand   Balance    End of Session PT - End of Session Equipment Utilized During Treatment: Gait belt Activity Tolerance: Patient limited by pain Patient left: in chair;with call bell/phone within reach Nurse Communication: Mobility status;Patient requests pain meds  GP     Van Clines [facility] [place], [place] [phone]  12/01/2012, 1:15 PM

## 2012-12-01 NOTE — Progress Notes (Signed)
ANTICOAGULATION CONSULT NOTE - Follow Up Consult  Pharmacy Consult for coumadin Indication: ASD, recurrent TIAs, s/p ORIF femur fx 5/25  Allergies  Allergen Reactions  . Dilaudid (Hydromorphone Hcl) Shortness Of Breath  . Morphine And Related Shortness Of Breath  . Sulfa Antibiotics Other (See Comments)    "Made me drunk";  wobbly  . Keflex (Cephalexin) Nausea Only  . Levaquin (Levofloxacin) Hives and Rash    Patient Measurements: Height: 5\' 4"  (162.6 cm) Weight: 140 lb (63.504 kg) IBW/kg (Calculated) : 54.7 Heparin Dosing Weight: 63.5  Vital Signs: Temp: 98.3 F (36.8 C) (05/26 0500) BP: 123/75 mmHg (05/26 0500) Pulse Rate: 76 (05/26 0500)  Labs:  Recent Labs  11/29/12 0315 11/29/12 1151 11/30/12 0535 12/01/12 0435  HGB 10.8*  --  11.5*  --   HCT 32.3*  --  34.8*  --   PLT 169  --  193  --   LABPROT 22.5*  --  16.9* 15.1  INR 2.08*  --  1.41 1.21  HEPARINUNFRC 0.39 0.66 <0.10*  --   CREATININE  --   --  0.79 0.87    Estimated Creatinine Clearance: 47.5 ml/min (by C-G formula based on Cr of 0.87).     Assessment: Ms. Guerrera is s/p  rt femur ORIF Sunday 5/25. She was on coumadin for ASD and recurrent TIAs PTA.  She was on bridge therapy with heparin prior to surgery and post op she is on LMWH 40 qday and coumadin resumed. She was given vitamin k 2.5 mg po 5/24 at 1700.    INR this am is 1.21.  Her CBC is stable.   Her home dose of coumadin is 3 mg alternating with 2 mg.   Goal of Therapy:  INR 2-3 Monitor platelets by anticoagulation protocol: Yes   Plan:  1. Coumadin 5 mg po x 1 dose today 2. Daily INR 3. LMWH 40 qday to be stopped when INR > = 1.8  Sheppard Coil PharmD., BCPS Clinical Pharmacist Pager [phone] 12/01/2012 8:43 AM

## 2012-12-01 NOTE — Progress Notes (Signed)
Subjective: Patient reports improvement in pain vs pre-op Nursing reports concentrated urine output Patient reports no nausea/vomiting;taking po well   Objective: Vital signs in last 24 hours: Temp:  [97.9 F (36.6 C)-98.5 F (36.9 C)] 98.3 F (36.8 C) (05/26 0500) Pulse Rate:  [76-87] 76 (05/26 0500) Resp:  [13-20] 16 (05/26 0500) BP: (99-128)/(49-75) 123/75 mmHg (05/26 0500) SpO2:  [94 %-100 %] 96 % (05/26 0500)  Intake/Output from previous day: 05/25 0701 - 05/26 0700 In: 3268.8 [I.V.:3268.8] Out: 2075 [Urine:1975; Blood:100] Intake/Output this shift:     Recent Labs  11/29/12 0315 11/30/12 0535  HGB 10.8* 11.5*    Recent Labs  11/29/12 0315 11/30/12 0535  WBC 6.6 5.8  RBC 3.71* 3.99  HCT 32.3* 34.8*  PLT 169 193    Recent Labs  11/30/12 0535 12/01/12 0435  NA  --  140  K  --  4.0  CL  --  108  CO2  --  23  BUN  --  16  CREATININE 0.79 0.87  GLUCOSE  --  95  CALCIUM  --  8.1*    Recent Labs  11/30/12 0535 12/01/12 0435  INR 1.41 1.21    Neurovascular intact Dorsiflexion/Plantar flexion intact Incision: dressing C/D/I Compartment soft  Assessment/Plan: Begin PT-WBAT with walker, and bilateral Total Hip Precautions Encourage po fluids, monitor urine output   Shamekia Tippets B 12/01/2012, 8:42 AM

## 2012-12-01 NOTE — Op Note (Signed)
Dictation Number:  [id number]

## 2012-12-01 NOTE — Progress Notes (Signed)
Patient ID: Renee Adams  female  WUJ:[id number]    DOB: 04-May-1934    DOA: 11/25/2012  PCP: Benita Stabile, MD  Assessment/Plan: Principal Problem:   Fracture of femoral shaft, right, closed-  - Ortho following, s/p Right hip ORIF, POD #1  - Coumadin started, PT evaluation, management per orthopedics   Active Problems:   Essential hypertension, benign- stable   UTI  -Completed the course of antibiotics   History of recurrent TIAs, ASD:  - Restarted Coumadin  Constipation: Place on Dulcolax suppository, MiraLax  DVT Prophylaxis:  Code Status:  Disposition: may need rehabilitation, patient lives alone, follow PT eval     Subjective: Postop day 1, patient tolerating pain, no nausea vomiting, fevers  Objective: Weight change:   Intake/Output Summary (Last 24 hours) at 12/01/12 1125 Last data filed at 12/01/12 0555  Gross per 24 hour  Intake 818.75 ml  Output   1325 ml  Net -506.25 ml   Blood pressure 123/75, pulse 76, temperature 98.3 F (36.8 C), temperature source Oral, resp. rate 18, height 5\' 4"  (1.626 m), weight 63.504 kg (140 lb), SpO2 96.00%.  Physical Exam: General: A x O x3, NAD CVS: S1-S2 clear, no murmur rubs or gallops Chest: CTAB Abdomen: soft NT, ND, NBS  Extremities: no c/c/e    Lab Results: Basic Metabolic Panel:  Recent Labs Lab 11/28/12 0557 11/30/12 0535 12/01/12 0435  NA 139  --  140  K 3.6  --  4.0  CL 108  --  108  CO2 21  --  23  GLUCOSE 81  --  95  BUN 15  --  16  CREATININE 0.83 0.79 0.87  CALCIUM 9.0  --  8.1*   CBC:  Recent Labs Lab 11/25/12 2040  11/29/12 0315 11/30/12 0535  WBC 7.0  < > 6.6 5.8  NEUTROABS 5.3  --   --   --   HGB 14.4  < > 10.8* 11.5*  HCT 41.5  < > 32.3* 34.8*  MCV 86.1  < > 87.1 87.2  PLT 174  < > 169 193  < > = values in this interval not displayed. Cardiac Enzymes: No results found for this basename: CKTOTAL, CKMB, CKMBINDEX, TROPONINI,  in the last 168 hours BNP: No components  found with this basename: POCBNP,  CBG: No results found for this basename: GLUCAP,  in the last 168 hours   Micro Results: Recent Results (from the past 240 hour(s))  URINE CULTURE     Status: None   Collection Time    11/25/12 10:39 PM      Result Value Range Status   Specimen Description URINE, CATHETERIZED   Final   Special Requests NONE   Final   Culture  Setup Time 11/26/2012 09:31   Final   Colony Count NO GROWTH   Final   Culture NO GROWTH   Final   Report Status 11/27/2012 FINAL   Final  SURGICAL PCR SCREEN     Status: Abnormal   Collection Time    11/26/12  4:43 AM      Result Value Range Status   MRSA, PCR POSITIVE (*) NEGATIVE Final   Comment: RESULT CALLED TO, READ BACK BY AND VERIFIED WITH:     Verline Lema RN [id number] [phone] EBANKS COLCLOUGH, S   Staphylococcus aureus POSITIVE (*) NEGATIVE Final   Comment:            The Xpert SA Assay (FDA     approved for NASAL  specimens     in patients over 12 years of age),     is one component of     a comprehensive surveillance     program.  Test performance has     been validated by [employer] for patients greater     than or equal to 46 year old.     It is not intended     to diagnose infection nor to     guide or monitor treatment.     RESULT CALLED TO, READ BACK BY AND VERIFIED WITH:     Verline Lema RN [id number] [phone] [facility] COLCLOUGH, S    Studies/Results: Dg Hip Complete Left  11/25/2012   *RADIOLOGY REPORT*  Clinical Data: Larey Seat.  Left hip pain.  LEFT HIP - COMPLETE 2+ VIEW  Comparison: 09/15/2011.  Findings: The acetabular Insert is likely loose as suggested on the prior examination.  No acute fracture or femoral head dislocation.  IMPRESSION: No acute fracture.   Original Report Authenticated By: Rudie Meyer, M.D.   Dg Hip Complete Right  11/25/2012   *RADIOLOGY REPORT*  Clinical Data: Larey Seat.  Bilateral hip pain.  RIGHT HIP - COMPLETE 2+ VIEW  Comparison: 09/25/2011.  Findings: Bilateral hip prosthesis appears  stable.  The acetabular Insert is likely loose as suggested on the prior study.  No femoral head dislocation. There is a lateral cortical fracture near the tip of the right prosthesis.  Stable areas of heterotopic ossification.  IMPRESSION: Lateral cortical fracture near the tip of the right femoral prosthesis.   Original Report Authenticated By: Rudie Meyer, M.D.   Dg Femur Right  11/25/2012   *RADIOLOGY REPORT*  Clinical Data: Larey Seat.  Right hip pain.  RIGHT FEMUR - 2 VIEW  Comparison: Earlier right hip films.  Findings:  Again demonstrated is a oblique coursing fracture in the distal tip of the right femoral prosthesis without displacement. The distal femoral prosthesis is intact.  No distal femur fracture.  IMPRESSION: Nondisplaced oblique coursing lateral cortical fracture of the proximal femur near the tip of the prosthesis.   Original Report Authenticated By: Rudie Meyer, M.D.   Ct Head Wo Contrast  11/25/2012   *RADIOLOGY REPORT*  Clinical Data: Larey Seat.  Hit head.  CT HEAD WITHOUT CONTRAST  Technique:  Contiguous axial images were obtained from the base of the skull through the vertex without contrast.  Comparison: 11/26/2008.  Findings: Stable area of encephalomalacia in the left high parietal area likely due to prior infarction.  The ventricles are normal and stable.  No extra-axial fluid collections are identified.  No CT findings for acute hemispheric infarction and/or intracranial hemorrhage.  No mass lesions.  The brainstem and cerebellum grossly normal and stable.  The bony structures are intact.  No acute skull fracture.  The paranasal sinuses and mastoid air cells are clear.  The globes are intact.  IMPRESSION:  1.  Remote left parietal infarct. 2.  No acute intracranial findings for acute skull fracture.   Original Report Authenticated By: Rudie Meyer, M.D.    Medications: Scheduled Meds: . amLODipine  5 mg Oral Daily  . atorvastatin  10 mg Oral Daily  . bisacodyl  10 mg Rectal Once  .  buPROPion  300 mg Oral Daily  . calcium carbonate  1,250 mg Oral BID WC  . docusate sodium  100 mg Oral BID  . enoxaparin (LOVENOX) injection  40 mg Subcutaneous Q24H  . lactose free nutrition  237 mL Oral BID  BM  . latanoprost  1 drop Both Eyes QHS  . sertraline  150 mg Oral Daily  . warfarin  5 mg Oral ONCE-1800  . Warfarin - Pharmacist Dosing Inpatient   Does not apply q1800      LOS: 6 days   RAI,RIPUDEEP M.D. [facility] 12/01/2012, 11:25 AM Pager: [phone]  If 7PM-7AM, please contact night-coverage [email or web address] Password [email or web address]

## 2012-12-01 NOTE — Op Note (Signed)
[name], Adams NO.:  [id number]  MEDICAL RECORD NO.:  [id number]  LOCATION:  5N24C                        FACILITY:  [facility]  PHYSICIAN:  Mila Homer. [name], M.D. DATE OF BIRTH:  1934/06/18  DATE OF PROCEDURE:  11/30/2012 DATE OF DISCHARGE:                              OPERATIVE REPORT   SURGEON:  Mila Homer. [name], M.D.  ASSISTANT:  Skip Mayer, PA-C  ANESTHESIA:  General.  PREOPERATIVE DIAGNOSIS:  Right periprosthetic femoral shaft fracture.  POSTOPERATIVE DIAGNOSIS:  Right periprosthetic femoral shaft fracture.  PROCEDURE:  Right open reduction and internal fixation of the periprosthetic femur fracture.  INDICATION FOR PROCEDURE:  The patient is a 79 year old white female, status post a total hip years ago, revision total knee in the last couple of years by myself and fell, mechanical fall, 72 hours ago suffering a small spiral fracture at the tip of the total hip stem. Informed consent was obtained.  DESCRIPTION OF PROCEDURE:  The patient was laid supine, administered general anesthesia.  The right leg was prepped and draped in usual sterile fashion.  With the patient in a floppy lateral position.  I made a straight incision along the mid shaft of the femur with a #10 blade. I used a cautery to obtained hemostasis and got through the fascia lata as well.  I then elevated the vastus lateralis off the septum anteriorly and now retracted them with retractors and cleaned off the lateral aspect of the femur with a Cobb elevator.  I identified the fracture. We chose a Zimmer cable plate system at 250 mm in length.  I got 3 unicortical screws proximal to the fracture with 1 cable.  I got 3 unicortical screws distal to the total knee stem and 3 bicortical screws in between.  This reported excellent fixation and C-arm imaging was used all the while with AP and lateral C-arm imaging.  The fracture was reduced anatomically.  I then lavaged.  I closed the  fascia lata with a running 1 Vicryl.  The buried 0 Vicryl in the deep soft tissue, subcuticular 2-0 Vicryl stitch and skin staples.  Dressed with Mepilex dressing.  Dressing and Ace wrap for compression.  COMPLICATIONS:  None.  DRAINS:  None.  ESTIMATED BLOOD LOSS:  300 mL.          ______________________________ Mila Homer. [name], M.D.     SDL/MEDQ  D:  12/01/2012  T:  12/01/2012  Job:  [id number]

## 2012-12-02 ENCOUNTER — Inpatient Hospital Stay (HOSPITAL_COMMUNITY)
Admission: RE | Admit: 2012-12-02 | Discharge: 2012-12-17 | DRG: 945 | Disposition: A | Discharge status: Home Health | Payer: Medicare Other | Source: Intra-hospital | Attending: Physical Medicine & Rehabilitation | Admitting: Physical Medicine & Rehabilitation

## 2012-12-02 DIAGNOSIS — S72301S Unspecified fracture of shaft of right femur, sequela: Secondary | ICD-10-CM

## 2012-12-02 DIAGNOSIS — Z8673 Personal history of transient ischemic attack (TIA), and cerebral infarction without residual deficits: Secondary | ICD-10-CM | POA: Diagnosis not present

## 2012-12-02 DIAGNOSIS — W19XXXA Unspecified fall, initial encounter: Secondary | ICD-10-CM | POA: Diagnosis not present

## 2012-12-02 DIAGNOSIS — Z79899 Other long term (current) drug therapy: Secondary | ICD-10-CM | POA: Diagnosis not present

## 2012-12-02 DIAGNOSIS — Z853 Personal history of malignant neoplasm of breast: Secondary | ICD-10-CM

## 2012-12-02 DIAGNOSIS — S72009A Fracture of unspecified part of neck of unspecified femur, initial encounter for closed fracture: Secondary | ICD-10-CM | POA: Diagnosis not present

## 2012-12-02 DIAGNOSIS — Y92009 Unspecified place in unspecified non-institutional (private) residence as the place of occurrence of the external cause: Secondary | ICD-10-CM | POA: Diagnosis not present

## 2012-12-02 DIAGNOSIS — H409 Unspecified glaucoma: Secondary | ICD-10-CM | POA: Diagnosis not present

## 2012-12-02 DIAGNOSIS — I10 Essential (primary) hypertension: Secondary | ICD-10-CM

## 2012-12-02 DIAGNOSIS — R32 Unspecified urinary incontinence: Secondary | ICD-10-CM | POA: Diagnosis not present

## 2012-12-02 DIAGNOSIS — S72009D Fracture of unspecified part of neck of unspecified femur, subsequent encounter for closed fracture with routine healing: Secondary | ICD-10-CM | POA: Diagnosis not present

## 2012-12-02 DIAGNOSIS — S72309A Unspecified fracture of shaft of unspecified femur, initial encounter for closed fracture: Secondary | ICD-10-CM | POA: Diagnosis not present

## 2012-12-02 DIAGNOSIS — Z96659 Presence of unspecified artificial knee joint: Secondary | ICD-10-CM

## 2012-12-02 DIAGNOSIS — D649 Anemia, unspecified: Secondary | ICD-10-CM

## 2012-12-02 DIAGNOSIS — E785 Hyperlipidemia, unspecified: Secondary | ICD-10-CM

## 2012-12-02 DIAGNOSIS — Z5189 Encounter for other specified aftercare: Secondary | ICD-10-CM | POA: Diagnosis not present

## 2012-12-02 DIAGNOSIS — S72001D Fracture of unspecified part of neck of right femur, subsequent encounter for closed fracture with routine healing: Secondary | ICD-10-CM

## 2012-12-02 DIAGNOSIS — Q211 Atrial septal defect: Secondary | ICD-10-CM | POA: Diagnosis not present

## 2012-12-02 DIAGNOSIS — F3289 Other specified depressive episodes: Secondary | ICD-10-CM

## 2012-12-02 DIAGNOSIS — F329 Major depressive disorder, single episode, unspecified: Secondary | ICD-10-CM | POA: Diagnosis not present

## 2012-12-02 DIAGNOSIS — S72001A Fracture of unspecified part of neck of right femur, initial encounter for closed fracture: Secondary | ICD-10-CM

## 2012-12-02 DIAGNOSIS — Q2111 Secundum atrial septal defect: Secondary | ICD-10-CM

## 2012-12-02 DIAGNOSIS — Z22322 Carrier or suspected carrier of Methicillin resistant Staphylococcus aureus: Secondary | ICD-10-CM

## 2012-12-02 DIAGNOSIS — Z96649 Presence of unspecified artificial hip joint: Secondary | ICD-10-CM

## 2012-12-02 DIAGNOSIS — Z7901 Long term (current) use of anticoagulants: Secondary | ICD-10-CM | POA: Diagnosis not present

## 2012-12-02 DIAGNOSIS — F411 Generalized anxiety disorder: Secondary | ICD-10-CM | POA: Diagnosis not present

## 2012-12-02 DIAGNOSIS — S8290XS Unspecified fracture of unspecified lower leg, sequela: Secondary | ICD-10-CM | POA: Diagnosis not present

## 2012-12-02 DIAGNOSIS — I672 Cerebral atherosclerosis: Secondary | ICD-10-CM | POA: Diagnosis not present

## 2012-12-02 DIAGNOSIS — F341 Dysthymic disorder: Secondary | ICD-10-CM

## 2012-12-02 LAB — CBC
HCT: 24.4 % — ABNORMAL LOW (ref 36.0–46.0)
Hemoglobin: 8.1 g/dL — ABNORMAL LOW (ref 12.0–15.0)
MCH: 29.7 pg (ref 26.0–34.0)
MCHC: 33.2 g/dL (ref 30.0–36.0)
MCV: 89.4 fL (ref 78.0–100.0)
RDW: 15.3 % (ref 11.5–15.5)

## 2012-12-02 LAB — COMPREHENSIVE METABOLIC PANEL
AST: 39 U/L — ABNORMAL HIGH (ref 0–37)
Alkaline Phosphatase: 59 U/L (ref 39–117)
CO2: 24 mEq/L (ref 19–32)
Chloride: 110 mEq/L (ref 96–112)
Creatinine, Ser: 0.82 mg/dL (ref 0.50–1.10)
GFR calc non Af Amer: 68 mL/min — ABNORMAL LOW (ref >90)
Potassium: 4.4 mEq/L (ref 3.5–5.1)
Total Bilirubin: 0.3 mg/dL (ref 0.3–1.2)

## 2012-12-02 LAB — PROTIME-INR: INR: 1.4 (ref 0.00–1.49)

## 2012-12-02 MED ORDER — ONDANSETRON HCL 4 MG PO TABS
4.0000 mg | ORAL_TABLET | Freq: Four times a day (QID) | ORAL | Status: DC | PRN
  Filled 2012-12-02: qty 1

## 2012-12-02 MED ORDER — AMLODIPINE BESYLATE 5 MG PO TABS
5.0000 mg | ORAL_TABLET | Freq: Every day | ORAL | Status: DC
  Administered 2012-12-03 – 2012-12-17 (×15): 5 mg via ORAL
  Filled 2012-12-02 (×17): qty 1

## 2012-12-02 MED ORDER — HYDROCODONE-ACETAMINOPHEN 5-325 MG PO TABS
1.0000 | ORAL_TABLET | Freq: Four times a day (QID) | ORAL | Status: DC | PRN
  Administered 2012-12-02 – 2012-12-03 (×3): 2 via ORAL
  Administered 2012-12-04 – 2012-12-05 (×3): 1 via ORAL
  Administered 2012-12-05: 2 via ORAL
  Administered 2012-12-06 – 2012-12-07 (×3): 1 via ORAL
  Administered 2012-12-08: 2 via ORAL
  Administered 2012-12-08 (×2): 1 via ORAL
  Administered 2012-12-08 – 2012-12-11 (×5): 2 via ORAL
  Administered 2012-12-11: 1 via ORAL
  Administered 2012-12-11 – 2012-12-15 (×6): 2 via ORAL
  Administered 2012-12-16 (×3): 1 via ORAL
  Filled 2012-12-02 (×4): qty 2
  Filled 2012-12-02 (×2): qty 1
  Filled 2012-12-02: qty 2
  Filled 2012-12-02 (×4): qty 1
  Filled 2012-12-02 (×5): qty 2
  Filled 2012-12-02 (×8): qty 1
  Filled 2012-12-02 (×3): qty 2
  Filled 2012-12-02: qty 1
  Filled 2012-12-02 (×4): qty 2

## 2012-12-02 MED ORDER — ROPINIROLE HCL 1 MG PO TABS
1.0000 mg | ORAL_TABLET | Freq: Every evening | ORAL | Status: DC | PRN
  Filled 2012-12-02: qty 1

## 2012-12-02 MED ORDER — ALPRAZOLAM 0.5 MG PO TABS
1.0000 mg | ORAL_TABLET | ORAL | Status: DC | PRN
  Administered 2012-12-03 (×2): 0.5 mg via ORAL
  Filled 2012-12-02 (×2): qty 1
  Filled 2012-12-02: qty 2

## 2012-12-02 MED ORDER — ENOXAPARIN SODIUM 40 MG/0.4ML ~~LOC~~ SOLN: 40.0000 mg | SUBCUTANEOUS | Status: DC

## 2012-12-02 MED ORDER — WARFARIN - PHARMACIST DOSING INPATIENT
Freq: Every day | Status: DC
  Administered 2012-12-08 – 2012-12-11 (×2)

## 2012-12-02 MED ORDER — ACETAMINOPHEN 325 MG PO TABS
650.0000 mg | ORAL_TABLET | Freq: Four times a day (QID) | ORAL | Status: DC | PRN
  Administered 2012-12-05 – 2012-12-17 (×5): 650 mg via ORAL
  Filled 2012-12-02 (×6): qty 2

## 2012-12-02 MED ORDER — ATORVASTATIN CALCIUM 10 MG PO TABS
10.0000 mg | ORAL_TABLET | Freq: Every day | ORAL | Status: DC
Start: 2012-12-03 — End: 2012-12-17
  Administered 2012-12-03 – 2012-12-17 (×15): 10 mg via ORAL
  Filled 2012-12-02 (×17): qty 1

## 2012-12-02 MED ORDER — ENOXAPARIN SODIUM 40 MG/0.4ML ~~LOC~~ SOLN
40.0000 mg | SUBCUTANEOUS | Status: DC
  Administered 2012-12-03: 40 mg via SUBCUTANEOUS
  Filled 2012-12-02 (×2): qty 0.4

## 2012-12-02 MED ORDER — LATANOPROST 0.005 % OP SOLN
1.0000 [drp] | Freq: Every day | OPHTHALMIC | Status: DC
  Administered 2012-12-02 – 2012-12-16 (×15): 1 [drp] via OPHTHALMIC
  Filled 2012-12-02 (×3): qty 2.5

## 2012-12-02 MED ORDER — WARFARIN SODIUM 5 MG PO TABS
5.0000 mg | ORAL_TABLET | Freq: Once | ORAL | Status: DC
  Filled 2012-12-02: qty 1

## 2012-12-02 MED ORDER — HYDROCODONE-ACETAMINOPHEN 5-325 MG PO TABS: 1.0000 | ORAL_TABLET | Freq: Four times a day (QID) | ORAL | Status: DC | PRN

## 2012-12-02 MED ORDER — CALCIUM CARBONATE 1250 (500 CA) MG PO TABS
1250.0000 mg | ORAL_TABLET | Freq: Two times a day (BID) | ORAL | Status: DC
  Administered 2012-12-03 – 2012-12-17 (×29): 1250 mg via ORAL
  Filled 2012-12-02 (×32): qty 1

## 2012-12-02 MED ORDER — BISACODYL 5 MG PO TBEC: 5.0000 mg | DELAYED_RELEASE_TABLET | Freq: Every day | ORAL | Status: DC | PRN

## 2012-12-02 MED ORDER — ACETAMINOPHEN 650 MG RE SUPP: 650.0000 mg | Freq: Four times a day (QID) | RECTAL | Status: DC | PRN

## 2012-12-02 MED ORDER — POLYETHYLENE GLYCOL 3350 17 G PO PACK
17.0000 g | PACK | Freq: Every day | ORAL | Status: DC | PRN
  Filled 2012-12-02: qty 1

## 2012-12-02 MED ORDER — BOOST PLUS PO LIQD
237.0000 mL | Freq: Two times a day (BID) | ORAL | Status: DC
  Filled 2012-12-02 (×4): qty 237

## 2012-12-02 MED ORDER — BUPROPION HCL ER (XL) 300 MG PO TB24
300.0000 mg | ORAL_TABLET | Freq: Every day | ORAL | Status: DC
  Administered 2012-12-03 – 2012-12-17 (×15): 300 mg via ORAL
  Filled 2012-12-02 (×17): qty 1

## 2012-12-02 MED ORDER — SERTRALINE HCL 50 MG PO TABS
150.0000 mg | ORAL_TABLET | Freq: Every day | ORAL | Status: DC
  Administered 2012-12-03 – 2012-12-17 (×15): 150 mg via ORAL
  Filled 2012-12-02 (×17): qty 1

## 2012-12-02 MED ORDER — ZOLPIDEM TARTRATE 5 MG PO TABS
5.0000 mg | ORAL_TABLET | Freq: Every evening | ORAL | Status: DC | PRN
  Administered 2012-12-04: 5 mg via ORAL
  Filled 2012-12-02: qty 1

## 2012-12-02 MED ORDER — ONDANSETRON HCL 4 MG/2ML IJ SOLN: 4.0000 mg | Freq: Four times a day (QID) | INTRAMUSCULAR | Status: DC | PRN

## 2012-12-02 MED ORDER — WARFARIN SODIUM 3 MG PO TABS
3.0000 mg | ORAL_TABLET | Freq: Once | ORAL | Status: AC
  Administered 2012-12-02: 3 mg via ORAL
  Filled 2012-12-02: qty 1

## 2012-12-02 NOTE — Progress Notes (Signed)
Report called to [facility] on rehab department. Pt Tx to 4010.

## 2012-12-02 NOTE — Progress Notes (Signed)
SPORTS MEDICINE AND JOINT REPLACEMENT  Georgena Spurling, MD   [name], PA-C [address] [place], [place], [place]  [address]                             [phone]   PROGRESS NOTE  Subjective:  negative for Chest Pain  negative for Shortness of Breath  negative for Nausea/Vomiting   negative for Calf Pain  negative for Bowel Movement   Tolerating Diet: yes         Patient reports pain as 5 on 0-10 scale.    Objective: Vital signs in last 24 hours:   Patient Vitals for the past 24 hrs:  BP Temp Pulse Resp SpO2  12/02/12 0800 - - - 18 -  12/02/12 0611 120/62 mmHg 98.4 F (36.9 C) 75 16 95 %  12/02/12 0400 - - - 16 99 %  12/02/12 0000 - - - 17 98 %  12/01/12 2116 104/47 mmHg 98.5 F (36.9 C) 91 18 94 %  12/01/12 2000 - - - 18 98 %  12/01/12 1555 - - - 18 -  12/01/12 1430 118/80 mmHg 98.1 F (36.7 C) 78 17 98 %    @flow {1959:LAST@   Intake/Output from previous day:   05/26 0701 - 05/27 0700 In: 270 [P.O.:270] Out: 350 [Urine:350]   Intake/Output this shift:       Intake/Output     05/26 0701 - 05/27 0700 05/27 0701 - 05/28 0700   P.O. 270    I.V. (mL/kg)     Total Intake(mL/kg) 270 (4.3)    Urine (mL/kg/hr) 350 (0.2)    Blood     Total Output 350     Net -80          Urine Occurrence 1 x       LABORATORY DATA:  Recent Labs  11/25/12 2040 11/27/12 0430 11/28/12 0557 11/29/12 0315 11/30/12 0535 12/02/12 0954  WBC 7.0 6.7 5.8 6.6 5.8 5.7  HGB 14.4 11.5* 11.3* 10.8* 11.5* 8.1*  HCT 41.5 34.6* 33.8* 32.3* 34.8* 24.4*  PLT 174 156 152 169 193 166    Recent Labs  11/25/12 2040 11/27/12 0430 11/28/12 0557 11/30/12 0535 12/01/12 0435 12/02/12 0530  NA 139 138 139  --  140 142  K 3.9 3.7 3.6  --  4.0 4.4  CL 103 106 108  --  108 110  CO2 22 19 21   --  23 24  BUN 21 16 15   --  16 19  CREATININE 0.82 0.86 0.83 0.79 0.87 0.82  GLUCOSE 93 79 81  --  95 87  CALCIUM 9.4 9.0 9.0  --  8.1* 8.5   Lab Results  Component Value Date   INR 1.40  12/02/2012   INR 1.21 12/01/2012   INR 1.41 11/30/2012    Examination:  General appearance: alert, appears stated age and no distress Extremities: Homans sign is negative, no sign of DVT  Wound Exam: clean, dry, intact   Drainage:  None: wound tissue dry  Motor Exam: EHL and FHL Intact  Sensory Exam: Deep Peroneal normal   Assessment:    2 Days Post-Op  Procedure(s) (LRB): OPEN REDUCTION INTERNAL FIXATION (ORIF) DISTAL FEMUR FRACTURE (Right)  ADDITIONAL DIAGNOSIS:  Principal Problem:   Fracture of femoral shaft, right, closed Active Problems:   Essential hypertension, benign   Fall   UTI (urinary tract infection)  Acute Blood Loss Anemia   Plan:  Physical Therapy as ordered Weight Bearing as Tolerated (WBAT)  DVT Prophylaxis:  Lovenox and Coumadin  DISCHARGE PLAN: Inpatient Rehab   DISCHARGE NEEDS: rolling walker         Mariane Burpee 12/02/2012, 1:48 PM

## 2012-12-02 NOTE — Progress Notes (Signed)
ANTICOAGULATION CONSULT NOTE - Follow Up Consult  Pharmacy Consult for Coumadin Indication: History of ASD/TIA  Allergies  Allergen Reactions  . Dilaudid (Hydromorphone Hcl) Shortness Of Breath  . Morphine And Related Shortness Of Breath  . Sulfa Antibiotics Other (See Comments)    "Made me drunk";  wobbly  . Keflex (Cephalexin) Nausea Only  . Levaquin (Levofloxacin) Hives and Rash    Patient Measurements: Height: 5\' 4"  (162.6 cm) Weight: 140 lb (63.504 kg) IBW/kg (Calculated) : 54.7 Heparin Dosing Weight:   Vital Signs: Temp: 98.4 F (36.9 C) (05/27 0611) BP: 120/62 mmHg (05/27 0611) Pulse Rate: 75 (05/27 0611)  Labs:  Recent Labs  11/30/12 0535 12/01/12 0435 12/02/12 0530 12/02/12 0954  HGB 11.5*  --   --  8.1*  HCT 34.8*  --   --  24.4*  PLT 193  --   --  166  LABPROT 16.9* 15.1 16.8*  --   INR 1.41 1.21 1.40  --   HEPARINUNFRC <0.10*  --   --   --   CREATININE 0.79 0.87 0.82  --     Estimated Creatinine Clearance: 50.4 ml/min (by C-G formula based on Cr of 0.82).   Medications:  Scheduled:  . amLODipine  5 mg Oral Daily  . atorvastatin  10 mg Oral Daily  . bisacodyl  10 mg Rectal Once  . buPROPion  300 mg Oral Daily  . calcium carbonate  1,250 mg Oral BID WC  . docusate sodium  100 mg Oral BID  . enoxaparin (LOVENOX) injection  40 mg Subcutaneous Q24H  . lactose free nutrition  237 mL Oral BID BM  . latanoprost  1 drop Both Eyes QHS  . sertraline  150 mg Oral Daily  . Warfarin - Pharmacist Dosing Inpatient   Does not apply q1800    Assessment: 79yo female s/p repair of femur fracture on 5/25, now with Coumadin resumed for hx ASD/TIA.  INR 1.4 this AM.  Hg & pltc down post-op.  No bleeding problems noted.  Currently receiving Lovenox 40mg  SQ daily as well, with plan to stop when INR >= 1.8.  Expected transfer to rehab today.  Goal of Therapy:  INR 2-3 Monitor platelets by anticoagulation protocol: Yes   Plan:  1.  Coumadin 3mg  today 2.  Watch  Hg 3.  F/U in AM  Marisue Humble, PharmD Clinical Pharmacist  [facility]

## 2012-12-02 NOTE — Progress Notes (Signed)
Admitting patient to CIR today. Met with pt to discuss inpatient rehab and she is in agreement with plan to come today. Dr Isidoro Donning reports pt is ready to d/c to CIR today.  For questions, call [phone]

## 2012-12-02 NOTE — H&P (Signed)
Physical Medicine and Rehabilitation Admission H&P  Chief Complaint   Patient presents with   .  Fall   :  HPI: Renee Adams is a 79 y.o. right-handed female with history of ASD and TIA maintained on chronic Coumadin as well as right total hip arthroplasty years ago as well as revision total knee in the last couple years. Patient lives alone and was independent prior to admission. Admitted 11/26/2012 after a fall sustaining a small spiral fracture at the tip of the total hip stem. INR upon admission 1.94. Patient underwent ORIF of periprosthetic femur fracture 12/01/2012 per Dr. [name]. Patient is weightbearing as tolerated with posterior hip precautions. Chronic Coumadin resumed with subcutaneous Lovenox for INR greater than 2.00. Postoperative pain control. Contact precautions for MRSA nasal nares. Physical occupational therapy evaluations completed with recommendations of physical medicine rehabilitation consult consider inpatient rehabilitation services. Patient was felt to be a good candidate for inpatient rehabilitation services and was admitted for comprehensive rehabilitation program  Review of Systems  Gastrointestinal:  GERD  Musculoskeletal: Positive for myalgias and joint pain. Restless leg syndrome  Psychiatric/Behavioral: Positive for depression.  All other systems reviewed and are negative  Past Medical History   Diagnosis  Date   .  GERD (gastroesophageal reflux disease)    .  Glaucoma    .  Cancer    .  Hypertension    .  Depression     Past Surgical History   Procedure  Laterality  Date   .  Total knee arthroplasty  Bilateral    .  Total hip arthroplasty  Bilateral    .  Mastectomy   1996     left breast lumpectomy   .  Incision and drainage hip   03/31/2012     Procedure: IRRIGATION AND DEBRIDEMENT HIP WITH POLY EXCHANGE; Surgeon: Raymon Mutton, MD; Location: [facility] OR; Service: Orthopedics; Laterality: Left;    Family History   Problem  Relation  Age of Onset   .   Cancer  Mother     Social History: reports that she has never smoked. She has never used smokeless tobacco. She reports that she does not drink alcohol or use illicit drugs.  Allergies:  Allergies   Allergen  Reactions   .  Dilaudid (Hydromorphone Hcl)  Shortness Of Breath   .  Morphine And Related  Shortness Of Breath   .  Sulfa Antibiotics  Other (See Comments)     "Made me drunk"; wobbly   .  Keflex (Cephalexin)  Nausea Only   .  Levaquin (Levofloxacin)  Hives and Rash    Medications Prior to Admission   Medication  Sig  Dispense  Refill   .  ALPRAZolam (XANAX) 0.25 MG tablet  Take 4 tablets (1 mg total) by mouth every 4 (four) hours as needed for anxiety.  40 tablet  0   .  amLODipine (NORVASC) 5 MG tablet  Take 5 mg by mouth daily.     Marland Kitchen  atorvastatin (LIPITOR) 10 MG tablet  Take 10 mg by mouth daily.     Marland Kitchen  buPROPion (WELLBUTRIN XL) 300 MG 24 hr tablet  Take 300 mg by mouth daily.     .  calcium carbonate 200 MG capsule  Take 600 mg by mouth 2 (two) times daily with a meal.     .  ciprofloxacin (CIPRO) 250 MG tablet  Take 250 mg by mouth 2 (two) times daily.     Marland Kitchen  latanoprost (  XALATAN) 0.005 % ophthalmic solution  Place 1 drop into both eyes at bedtime.     .  methocarbamol (ROBAXIN) 500 MG tablet  Take 1-2 tablets (500-1,000 mg total) by mouth every 6 (six) hours as needed.  60 tablet  0   .  rOPINIRole (REQUIP) 1 MG tablet  Take 1 mg by mouth at bedtime as needed. For restless legs     .  sertraline (ZOLOFT) 100 MG tablet  Take 150 mg by mouth daily.     Marland Kitchen  warfarin (COUMADIN) 2 MG tablet  Take 2 mg by mouth every other day.     .  warfarin (COUMADIN) 3 MG tablet  Take 3 mg by mouth every other day.      Home:  Home Living  Lives With: Alone  Available Help at Discharge: Family;Available 24 hours/day  Type of Home: House  Home Access: Stairs to enter  [employer] of Steps: 1  Entrance Stairs-Rails: None  Home Layout: One level (1 step to get into den)   [employer] Shower/Tub: [employer];Door  Bathroom Toilet: Handicapped height  Home Adaptive Equipment: Walker - rolling;Raised toilet seat with rails;Reacher;Sock aid  Functional History:  Prior Function  Able to Take Stairs?: Yes  Driving: Yes  Vocation: Retired  Functional Status:  Mobility:  Bed Mobility  Bed Mobility: Supine to Sit;Sitting - Scoot to Edge of Bed  Supine to Sit: 1: +2 Total assist  Supine to Sit: Patient Percentage: 40%  Sitting - Scoot to Edge of Bed: 1: +2 Total assist  Sitting - Scoot to Edge of Bed: Patient Percentage: 30%  Sit to Supine: 2: Max assist;HOB flat  Transfers  Transfers: Sit to Stand;Stand to [employer] Transfers  Sit to Stand: 1: +2 Total assist;With upper extremity assist;From bed  Sit to Stand: Patient Percentage: 40%  Stand to Sit: 1: +2 Total assist;With upper extremity assist;To chair/3-in-1  Stand to Sit: Patient Percentage: 40%  Stand Pivot Transfers: (Attempted, but pt quite painful and unable)  Stand Pivot Transfers: Patient Percentage: 60%  Ambulation/Gait  Ambulation/Gait Assistance: Not tested (comment)  Ambulation Distance (Feet): 1 Feet  Assistive device: Rolling walker  Ambulation/Gait Assistance Details: Pt only able to take 1 step forward. Pt unable to maintain TDWB on RLE and reverted to NWB.  Gait Pattern: Step-to pattern  Stairs: No  Wheelchair Mobility  Wheelchair Mobility: No  ADL:  ADL  Eating/Feeding: Independent  Where Assessed - Eating/Feeding: Chair  Grooming: Set up  Where Assessed - Grooming: Supported sitting  Upper Body Bathing: Set up  Where Assessed - Upper Body Bathing: Supported sitting  Lower Body Bathing: +2 Total assistance  Where Assessed - Lower Body Bathing: Supported sit to stand  Upper Body Dressing: Set up  Where Assessed - Upper Body Dressing: Supported sitting  Lower Body Dressing: +2 Total assistance  Where Assessed - Lower Body Dressing: Supported sit to Insurance risk surveyor: +2 Total assistance  Toilet Transfer Method: Sit to Production manager: Other (comment) (from bed to recliner chair)  Tub/Shower Transfer Method: Not assessed  Equipment Used: Gait belt;Rolling walker  Transfers/Ambulation Related to ADLs: Sit to stand transfer +2 total A (40%).  ADL Comments: Pt at Total A level for LB ADLs and has bilateral hip precautions to maintain. Session very limited due to pain.  Cognition:  Cognition  Overall Cognitive Status: Within Functional Limits for tasks assessed  Arousal/Alertness: Awake/alert  Orientation Level: Oriented to person;Oriented to place;Oriented to time  Cognition  Arousal/Alertness: Awake/alert  Behavior During Therapy: WFL for tasks assessed/performed  Overall Cognitive Status: Within Functional Limits for tasks assessed  Physical Exam:  Blood pressure 120/62, pulse 75, temperature 98.4 F (36.9 C), temperature source Oral, resp. rate 16, height 5\' 4"  (1.626 m), weight 63.504 kg (140 lb), SpO2 95.00%.  Physical Exam  Vitals reviewed.  Constitutional: She is oriented to person, place, and time. She appears well-developed and well-nourished.  HENT:  Head: Normocephalic and atraumatic.  Right Ear: External ear normal.  Left Ear: External ear normal.  Eyes: Conjunctivae and EOM are normal. Pupils are equal, round, and reactive to light. Right eye exhibits no discharge. Left eye exhibits no discharge. No scleral icterus.  Neck: Normal range of motion. Neck supple. No JVD present. No tracheal deviation present. No thyromegaly present.  Cardiovascular: Normal rate and regular rhythm. Exam reveals no friction rub.  No murmur heard.  Pulmonary/Chest: Effort normal and breath sounds normal. No respiratory distress. She has no wheezes.  Abdominal: Soft. Bowel sounds are normal. She exhibits no distension and no mass. There is no tenderness. There is no rebound and no guarding.  Musculoskeletal:  Right hip appropriately  tender. RA deformities in the hands.  Lymphadenopathy:  She has no cervical adenopathy.  Neurological: She is alert and oriented to person, place, and time. UE is 4/5. RLE is 2-3/5 (pain) proximally to 4-/5 distally. LLE is 4/5 proximally to distally.  Skin: No rash noted. No erythema.  Surgical site clean and dry  Psychiatric: She has a normal mood and affect. Her behavior is normal. Judgment and thought content normal  Results for orders placed during the hospital encounter of 11/25/12 (from the past 48 hour(s))   COMPREHENSIVE METABOLIC PANEL Status: Abnormal    Collection Time    12/01/12 4:35 AM   Result  Value  Range    Sodium  140  135 - 145 mEq/L    Potassium  4.0  3.5 - 5.1 mEq/L    Chloride  108  96 - 112 mEq/L    CO2  23  19 - 32 mEq/L    Glucose, Bld  95  70 - 99 mg/dL    BUN  16  6 - 23 mg/dL    Creatinine, Ser  1.61  0.50 - 1.10 mg/dL    Calcium  8.1 (*)  8.4 - 10.5 mg/dL    Total Protein  5.3 (*)  6.0 - 8.3 g/dL    Albumin  2.6 (*)  3.5 - 5.2 g/dL    AST  43 (*)  0 - 37 U/L    ALT  20  0 - 35 U/L    Alkaline Phosphatase  57  39 - 117 U/L    Total Bilirubin  0.4  0.3 - 1.2 mg/dL    GFR calc non Af Amer  63 (*)  >90 mL/min    GFR calc Af Amer  73 (*)  >90 mL/min    Comment:      The eGFR has been calculated     using the CKD EPI equation.     This calculation has not been     validated in all clinical     situations.     eGFR's persistently     <90 mL/min signify     possible Chronic Kidney Disease.   PROTIME-INR Status: None    Collection Time    12/01/12 4:35 AM   Result  Value  Range  Prothrombin Time  15.1  11.6 - 15.2 seconds    INR  1.21  0.00 - 1.49   COMPREHENSIVE METABOLIC PANEL Status: Abnormal    Collection Time    12/02/12 5:30 AM   Result  Value  Range    Sodium  142  135 - 145 mEq/L    Potassium  4.4  3.5 - 5.1 mEq/L    Chloride  110  96 - 112 mEq/L    CO2  24  19 - 32 mEq/L    Glucose, Bld  87  70 - 99 mg/dL    BUN  19  6 - 23 mg/dL     Creatinine, Ser  4.09  0.50 - 1.10 mg/dL    Calcium  8.5  8.4 - 10.5 mg/dL    Total Protein  5.4 (*)  6.0 - 8.3 g/dL    Albumin  2.5 (*)  3.5 - 5.2 g/dL    AST  39 (*)  0 - 37 U/L    ALT  17  0 - 35 U/L    Alkaline Phosphatase  59  39 - 117 U/L    Total Bilirubin  0.3  0.3 - 1.2 mg/dL    GFR calc non Af Amer  68 (*)  >90 mL/min    GFR calc Af Amer  79 (*)  >90 mL/min    Comment:      The eGFR has been calculated     using the CKD EPI equation.     This calculation has not been     validated in all clinical     situations.     eGFR's persistently     <90 mL/min signify     possible Chronic Kidney Disease.   PROTIME-INR Status: Abnormal    Collection Time    12/02/12 5:30 AM   Result  Value  Range    Prothrombin Time  16.8 (*)  11.6 - 15.2 seconds    INR  1.40  0.00 - 1.49    No results found.  Post Admission Physician Evaluation:  1. Functional deficits secondary to right periprosthetic femoral shaft fx. 2. Patient is admitted to receive collaborative, interdisciplinary care between the physiatrist, rehab nursing staff, and therapy team. 3. Patient's level of medical complexity and substantial therapy needs in context of that medical necessity cannot be provided at a lesser intensity of care such as a SNF. 4. Patient has experienced substantial functional loss from his/her baseline which was documented above under the "Functional History" and "Functional Status" headings. Judging by the patient's diagnosis, physical exam, and functional history, the patient has potential for functional progress which will result in measurable gains while on inpatient rehab. These gains will be of substantial and practical use upon discharge in facilitating mobility and self-care at the household level. 5. Physiatrist will provide 24 hour management of medical needs as well as oversight of the therapy plan/treatment and provide guidance as appropriate regarding the interaction of the two. 6. 24 hour  rehab nursing will assist with bladder management, bowel management, safety, skin/wound care, disease management, medication administration, pain management and patient education and help integrate therapy concepts, techniques,education, etc. 7. PT will assess and treat for/with: Lower extremity strength, range of motion, stamina, balance, functional mobility, safety, adaptive techniques and equipment, pain mgt, ortho precautions. Goals are: supervision to minimal assist. 8. OT will assess and treat for/with: ADL's, functional mobility, safety, upper extremity strength, adaptive techniques and equipment, pain mgt, ortho precautions. Goals  are: supervision to minimal assist. 9. SLP will assess and treat for/with: n/a. Goals are: n/a. 10. Case Management and Social Worker will assess and treat for psychological issues and discharge planning. 11. Team conference will be held weekly to assess progress toward goals and to determine barriers to discharge. 12. Patient will receive at least 3 hours of therapy per day at least 5 days per week. 13. ELOS: 2 weeks 14.  Prognosis: excellent   Medical Problem List and Plan:  1. Right periprosthetic femoral shaft fracture. Status post ORIF 12/01/2012.  2. DVT Prophylaxis/Anticoagulation: Chronic Coumadin therapy. INR 1.40. Subcutaneous Lovenox for INR greater than 2.00  3. Pain Management: Hydrocodone and Robaxin as needed. Monitor the increased mobility  4. Mood: Xanax 1 mg every 4 hours as needed. Wellbutrin 300 mg daily, Zoloft 150 mg daily. Provide emotional support  5. Neuropsych: This patient is capable of making decisions on her own behalf.  6. Hypertension. Norvasc 5 mg daily. Monitor for the effects of increased mobility and pain upon her BP. 7. Hyperlipidemia. Lipitor  8. Contact precautions for MRSA positive nasal nares            Ranelle Oyster, MD, [facility] Physical Medicine & Rehabilitation  12/02/2012

## 2012-12-02 NOTE — Discharge Summary (Signed)
Physician Discharge Summary  Patient ID: Renee Adams MRN: [id number] DOB/AGE: Jan 27, 1934 79 y.o.  Admit date: 11/25/2012 Discharge date: 12/02/2012  Primary Care Physician:  Benita Stabile, MD  Discharge Diagnoses:     Fracture of the right femoral shaft status post ORIF on 11/30/2012  . Essential hypertension, benign . UTI (urinary tract infection)   History of recurrent TIAs, ASD on Coumadin   Constipation  Consults:  Orthopedics, Dr. Queen Blossom                     Inpatient rehabilitation   Allergies:   Allergies  Allergen Reactions  . Dilaudid (Hydromorphone Hcl) Shortness Of Breath  . Morphine And Related Shortness Of Breath  . Sulfa Antibiotics Other (See Comments)    "Made me drunk";  wobbly  . Keflex (Cephalexin) Nausea Only  . Levaquin (Levofloxacin) Hives and Rash     Discharge Medications:   Medication List    STOP taking these medications       ciprofloxacin 250 MG tablet  Commonly known as:  CIPRO      TAKE these medications       ALPRAZolam 0.25 MG tablet  Commonly known as:  XANAX  Take 4 tablets (1 mg total) by mouth every 4 (four) hours as needed for anxiety.     amLODipine 5 MG tablet  Commonly known as:  NORVASC  Take 5 mg by mouth daily.     atorvastatin 10 MG tablet  Commonly known as:  LIPITOR  Take 10 mg by mouth daily.     buPROPion 300 MG 24 hr tablet  Commonly known as:  WELLBUTRIN XL  Take 300 mg by mouth daily.     calcium carbonate 200 MG capsule  Take 600 mg by mouth 2 (two) times daily with a meal.     latanoprost 0.005 % ophthalmic solution  Commonly known as:  XALATAN  Place 1 drop into both eyes at bedtime.     methocarbamol 500 MG tablet  Commonly known as:  ROBAXIN  Take 1-2 tablets (500-1,000 mg total) by mouth every 6 (six) hours as needed.     rOPINIRole 1 MG tablet  Commonly known as:  REQUIP  Take 1 mg by mouth at bedtime as needed. For restless legs     sertraline 100 MG tablet  Commonly  known as:  ZOLOFT  Take 150 mg by mouth daily.     warfarin 3 MG tablet  Commonly known as:  COUMADIN  Take 3 mg by mouth every other day.     warfarin 2 MG tablet  Commonly known as:  COUMADIN  Take 2 mg by mouth every other day.         Brief H and P: For complete details please refer to admission H and P, but in brief 79 y/o woman with PMH significant for depression, HTN and multiple surgeries in the past for hip and knee joint replecements Presented to the ED with a fall and right hip pain.  Patient reported that she felt something pop in her hip while walking downstairs outside her house and fell. She reported having right hip pain since the fall. Denied any head trauma or loss of conscious. She was unable to ambulate. Xray in the ER suggestive of femur fracture   Hospital Course:  Fracture of femoral shaft, right, closed- orthopedics was consulted. Initially it was felt that patient may be okay with nonoperative route, however due to continuous pain and  unable to progress with physical therapy, decision was made by the patient and orthopedics to go through with the surgery. Patient underwent right hip open reduction and internal fixation on 11/30/2012. Physical therapy recommended inpatient rehabilitation.   Essential hypertension, benign- stable   UTI : Completed the course of antibiotics   History of recurrent TIAs, ASD: Patient was placed on heparin drip during the perioperative period for the surgery. Now she is back on Coumadin.  Constipation: Placed on Dulcolax suppository, MiraLax      Day of Discharge BP 120/62  Pulse 75  Temp(Src) 98.4 F (36.9 C) (Oral)  Resp 18  Ht 5\' 4"  (1.626 m)  Wt 63.504 kg (140 lb)  BMI 24.02 kg/m2  SpO2 95%  Physical Exam: General: Alert and awake oriented x3 not in any acute distress. CVS: S1-S2 clear no murmur rubs or gallops Chest: clear to auscultation bilaterally, no wheezing rales or rhonchi Abdomen: soft nontender,  nondistended, normal bowel sounds Extremities: no cyanosis, clubbing or edema noted bilaterally    The results of significant diagnostics from this hospitalization (including imaging, microbiology, ancillary and laboratory) are listed below for reference.    LAB RESULTS: Basic Metabolic Panel:  Recent Labs Lab 12/01/12 0435 12/02/12 0530  NA 140 142  K 4.0 4.4  CL 108 110  CO2 23 24  GLUCOSE 95 87  BUN 16 19  CREATININE 0.87 0.82  CALCIUM 8.1* 8.5   Liver Function Tests:  Recent Labs Lab 12/01/12 0435 12/02/12 0530  AST 43* 39*  ALT 20 17  ALKPHOS 57 59  BILITOT 0.4 0.3  PROT 5.3* 5.4*  ALBUMIN 2.6* 2.5*   No results found for this basename: LIPASE, AMYLASE,  in the last 168 hours No results found for this basename: AMMONIA,  in the last 168 hours CBC:  Recent Labs Lab 11/25/12 2040  11/30/12 0535 12/02/12 0954  WBC 7.0  < > 5.8 5.7  NEUTROABS 5.3  --   --   --   HGB 14.4  < > 11.5* 8.1*  HCT 41.5  < > 34.8* 24.4*  MCV 86.1  < > 87.2 89.4  PLT 174  < > 193 166  < > = values in this interval not displayed. Cardiac Enzymes: No results found for this basename: CKTOTAL, CKMB, CKMBINDEX, TROPONINI,  in the last 168 hours BNP: No components found with this basename: POCBNP,  CBG: No results found for this basename: GLUCAP,  in the last 168 hours  Significant Diagnostic Studies:  Dg Hip Complete Left  11/25/2012   *RADIOLOGY REPORT*  Clinical Data: Larey Seat.  Left hip pain.  LEFT HIP - COMPLETE 2+ VIEW  Comparison: 09/15/2011.  Findings: The acetabular Insert is likely loose as suggested on the prior examination.  No acute fracture or femoral head dislocation.  IMPRESSION: No acute fracture.   Original Report Authenticated By: Rudie Meyer, M.D.   Dg Hip Complete Right  11/25/2012   *RADIOLOGY REPORT*  Clinical Data: Larey Seat.  Bilateral hip pain.  RIGHT HIP - COMPLETE 2+ VIEW  Comparison: 09/25/2011.  Findings: Bilateral hip prosthesis appears stable.  The acetabular  Insert is likely loose as suggested on the prior study.  No femoral head dislocation. There is a lateral cortical fracture near the tip of the right prosthesis.  Stable areas of heterotopic ossification.  IMPRESSION: Lateral cortical fracture near the tip of the right femoral prosthesis.   Original Report Authenticated By: Rudie Meyer, M.D.   Dg Femur Right  11/25/2012   *  RADIOLOGY REPORT*  Clinical Data: Larey Seat.  Right hip pain.  RIGHT FEMUR - 2 VIEW  Comparison: Earlier right hip films.  Findings:  Again demonstrated is a oblique coursing fracture in the distal tip of the right femoral prosthesis without displacement. The distal femoral prosthesis is intact.  No distal femur fracture.  IMPRESSION: Nondisplaced oblique coursing lateral cortical fracture of the proximal femur near the tip of the prosthesis.   Original Report Authenticated By: Rudie Meyer, M.D.   Ct Head Wo Contrast  11/25/2012   *RADIOLOGY REPORT*  Clinical Data: Larey Seat.  Hit head.  CT HEAD WITHOUT CONTRAST  Technique:  Contiguous axial images were obtained from the base of the skull through the vertex without contrast.  Comparison: 11/26/2008.  Findings: Stable area of encephalomalacia in the left high parietal area likely due to prior infarction.  The ventricles are normal and stable.  No extra-axial fluid collections are identified.  No CT findings for acute hemispheric infarction and/or intracranial hemorrhage.  No mass lesions.  The brainstem and cerebellum grossly normal and stable.  The bony structures are intact.  No acute skull fracture.  The paranasal sinuses and mastoid air cells are clear.  The globes are intact.  IMPRESSION:  1.  Remote left parietal infarct. 2.  No acute intracranial findings for acute skull fracture.   Original Report Authenticated By: Rudie Meyer, M.D.   Disposition and Follow-up:  Future Appointments Provider Department Dept Phone   12/08/2012 1:45 PM Ginnie Smart, MD [facility] [phone]   09/14/2013 1:15 PM Dava Najjar Idelle Jo [facility] ON[facility] [phone]   09/14/2013 1:45 PM Amy Allegra Grana, PA-C [facility] MED[facility] [phone]       DISPOSITION: Inpatient rehabilitation DIET: Heart healthy diet ACTIVITY: As tolerated per physical therapy and orthopedics recommendations   DISCHARGE FOLLOW-UP Follow-up Information   Follow up with LUCEY,STEPHEN D, MD. Schedule an appointment as soon as possible for a visit in 2 weeks.   Contact information:   [address]astasia Fiedler [address] [place] [address] [phone]       Follow up with Benita Stabile, MD. Schedule an appointment as soon as possible for a visit in 2 weeks.   Contact information:   [facility] [address] [place] [address] [phone]       Time spent on Discharge:  35 minutes  Signed:   RAI,RIPUDEEP M.D. [facility] 12/02/2012, 12:40 PM Pager: [phone]

## 2012-12-02 NOTE — Progress Notes (Signed)
Physical Therapy Treatment Patient Details Name: Renee Adams MRN: [id number] DOB: 02/16/1934 Today's Date: 12/02/2012 Time: 6213-0865 PT Time Calculation (min): 26 min  PT Assessment / Plan / Recommendation Comments on Treatment Session  Making progress with mobility, transfers, and taking steps today; Hopeful for dc to CIR soon    Follow Up Recommendations  CIR     Does the patient have the potential to tolerate intense rehabilitation     Barriers to Discharge        Equipment Recommendations       Recommendations for Other Services    Frequency Min 6X/week   Plan Discharge plan remains appropriate;Frequency remains appropriate    Precautions / Restrictions Precautions Precautions: Posterior Hip;Fall Precaution Comments: Bilateral Posterior Hip precautions Restrictions Weight Bearing Restrictions: No RLE Weight Bearing: Weight bearing as tolerated Other Position/Activity Restrictions: OK for Hip ROM (within post prec), and no restrictions on knee ROM   Pertinent Vitals/Pain 9/10 wit movement; Was premedicated for pain    Mobility  Bed Mobility Bed Mobility: Supine to Sit;Sitting - Scoot to Edge of Bed Supine to Sit: 1: +2 Total assist Supine to Sit: Patient Percentage: 40% Sitting - Scoot to Edge of Bed: 1: +2 Total assist Sitting - Scoot to Edge of Bed: Patient Percentage: 30% Details for Bed Mobility Assistance: cues and extensive physical assist needed during transitions; Very painful, and had to move quite slowly; Assist needed for LE movement to EOB and to elevate trunk from bed Transfers Transfers: Sit to Stand;Stand to Sit Sit to Stand: 1: +2 Total assist;With upper extremity assist;From bed Sit to Stand: Patient Percentage: 50% Stand to Sit: 1: +2 Total assist;With upper extremity assist;To chair/3-in-1 Stand to Sit: Patient Percentage: 50% Details for Transfer Assistance: Cues to preposition stronger LLE closer to base of support for better success with  sit to stand; continues to require bilateral support for lift from bed Ambulation/Gait Ambulation/Gait Assistance: 1: +2 Total assist Ambulation/Gait: Patient Percentage: 50% Ambulation Distance (Feet): 4 Feet Assistive device: Rolling walker Ambulation/Gait Assistance Details: Pivot steps bed to recliner; did not tolerate much WBing at all through RLE Gait Pattern: Step-to pattern    Exercises     PT Diagnosis:    PT Problem List:   PT Treatment Interventions:     PT Goals Acute Rehab PT Goals Time For Goal Achievement: 12/15/12 Potential to Achieve Goals: Good Pt will go Supine/Side to Sit: with supervision PT Goal: Supine/Side to Sit - Progress: Progressing toward goal Pt will go Sit to Stand: with supervision PT Goal: Sit to Stand - Progress: Progressing toward goal Pt will go Stand to Sit: with supervision PT Goal: Stand to Sit - Progress: Progressing toward goal Pt will Transfer Bed to Chair/Chair to Bed: with supervision PT Transfer Goal: Bed to Chair/Chair to Bed - Progress: Progressing toward goal Pt will Ambulate: 1 - 15 feet;with supervision;with rolling walker PT Goal: Ambulate - Progress: Progressing toward goal  Visit Information  Last PT Received On: 12/02/12 Assistance Needed: +2    Subjective Data  Subjective: Agreeable to getting up   Cognition  Cognition Arousal/Alertness: Awake/alert Behavior During Therapy: WFL for tasks assessed/performed Overall Cognitive Status: Within Functional Limits for tasks assessed    Balance     End of Session PT - End of Session Equipment Utilized During Treatment: Gait belt Activity Tolerance: Patient limited by pain Patient left: in chair;with call bell/phone within reach Nurse Communication: Mobility status;Patient requests pain meds   GP  Van Clines [place], [place] [phone]  12/02/2012, 4:32 PM

## 2012-12-02 NOTE — Consult Note (Signed)
Physical Medicine and Rehabilitation Consult Reason for Consult: Right peri Prosthetic femoral shaft fracture Referring Physician: [facility]   HPI: Renee Adams is a 79 y.o. right-handed female with history of ASD and TIA maintained on chronic Coumadin as well as right total hip arthroplasty years ago as well as revision total knee in the last couple years. Patient lives alone and was independent prior to admission. Admitted 11/26/2012 after a fall sustaining a small spiral fracture at the tip of the total hip stem. INR upon admission 1.94. Patient underwent ORIF of periprosthetic femur fracture 12/01/2012 per Dr. [name]. Patient is weightbearing as tolerated with posterior hip precautions. Chronic Coumadin resumed with subcutaneous Lovenox for INR greater than 2.00. Postoperative pain control. Physical occupational therapy evaluations completed with recommendations of physical medicine rehabilitation consult consider inpatient rehabilitation services.   Review of Systems  Gastrointestinal:       GERD  Musculoskeletal: Positive for myalgias and joint pain.  Psychiatric/Behavioral: Positive for depression.  All other systems reviewed and are negative.   Past Medical History  Diagnosis Date  . GERD (gastroesophageal reflux disease)   . Glaucoma   . Cancer   . Hypertension   . Depression    Past Surgical History  Procedure Laterality Date  . Total knee arthroplasty Bilateral   . Total hip arthroplasty Bilateral   . Mastectomy  1996    left breast lumpectomy   . Incision and drainage hip  03/31/2012    Procedure: IRRIGATION AND DEBRIDEMENT HIP WITH POLY EXCHANGE;  Surgeon: Raymon Mutton, MD;  Location: [facility] OR;  Service: Orthopedics;  Laterality: Left;   Family History  Problem Relation Age of Onset  . Cancer Mother    Social History:  reports that she has never smoked. She has never used smokeless tobacco. She reports that she does not drink alcohol or use illicit drugs. Allergies:   Allergies  Allergen Reactions  . Dilaudid (Hydromorphone Hcl) Shortness Of Breath  . Morphine And Related Shortness Of Breath  . Sulfa Antibiotics Other (See Comments)    "Made me drunk";  wobbly  . Keflex (Cephalexin) Nausea Only  . Levaquin (Levofloxacin) Hives and Rash   Medications Prior to Admission  Medication Sig Dispense Refill  . ALPRAZolam (XANAX) 0.25 MG tablet Take 4 tablets (1 mg total) by mouth every 4 (four) hours as needed for anxiety.  40 tablet  0  . amLODipine (NORVASC) 5 MG tablet Take 5 mg by mouth daily.      Marland Kitchen atorvastatin (LIPITOR) 10 MG tablet Take 10 mg by mouth daily.        Marland Kitchen buPROPion (WELLBUTRIN XL) 300 MG 24 hr tablet Take 300 mg by mouth daily.      . calcium carbonate 200 MG capsule Take 600 mg by mouth 2 (two) times daily with a meal.      . ciprofloxacin (CIPRO) 250 MG tablet Take 250 mg by mouth 2 (two) times daily.      Marland Kitchen latanoprost (XALATAN) 0.005 % ophthalmic solution Place 1 drop into both eyes at bedtime.      . methocarbamol (ROBAXIN) 500 MG tablet Take 1-2 tablets (500-1,000 mg total) by mouth every 6 (six) hours as needed.  60 tablet  0  . rOPINIRole (REQUIP) 1 MG tablet Take 1 mg by mouth at bedtime as needed. For restless legs      . sertraline (ZOLOFT) 100 MG tablet Take 150 mg by mouth daily.      Marland Kitchen warfarin (COUMADIN) 2  MG tablet Take 2 mg by mouth every other day.      . warfarin (COUMADIN) 3 MG tablet Take 3 mg by mouth every other day.        Home: Home Living Lives With: Alone Available Help at Discharge: Family;Available 24 hours/day Type of Home: House Home Access: Stairs to enter [employer] of Steps: 1 Entrance Stairs-Rails: None Home Layout: One level (1 step to get into den) [employer] Shower/Tub: [employer];Door Bathroom Toilet: Handicapped height Home Adaptive Equipment: Walker - rolling;Raised toilet seat with rails;Reacher;Sock aid  Functional History: Prior Function Able to Take Stairs?:  Yes Driving: Yes Vocation: Retired Functional Status:  Mobility: Bed Mobility Bed Mobility: Supine to Sit;Sitting - Scoot to Edge of Bed Supine to Sit: 1: +2 Total assist Supine to Sit: Patient Percentage: 40% Sitting - Scoot to Edge of Bed: 1: +2 Total assist Sitting - Scoot to Edge of Bed: Patient Percentage: 30% Sit to Supine: 2: Max assist;HOB flat Transfers Transfers: Sit to Stand;Stand to [employer] Transfers Sit to Stand: 1: +2 Total assist;With upper extremity assist;From bed Sit to Stand: Patient Percentage: 40% Stand to Sit: 1: +2 Total assist;With upper extremity assist;To chair/3-in-1 Stand to Sit: Patient Percentage: 40% Stand Pivot Transfers:  (Attempted, but pt quite painful and unable) Stand Pivot Transfers: Patient Percentage: 60% Ambulation/Gait Ambulation/Gait Assistance: Not tested (comment) Ambulation Distance (Feet): 1 Feet Assistive device: Rolling walker Ambulation/Gait Assistance Details: Pt only able to take 1 step forward.  Pt unable to maintain TDWB on RLE and reverted to NWB.   Gait Pattern: Step-to pattern Stairs: No Wheelchair Mobility Wheelchair Mobility: No  ADL: ADL Eating/Feeding: Independent Where Assessed - Eating/Feeding: Chair Grooming: Set up Where Assessed - Grooming: Supported sitting Upper Body Bathing: Set up Where Assessed - Upper Body Bathing: Supported sitting Lower Body Bathing: +2 Total assistance Where Assessed - Lower Body Bathing: Supported sit to stand Upper Body Dressing: Set up Where Assessed - Upper Body Dressing: Supported sitting Lower Body Dressing: +2 Total assistance Where Assessed - Lower Body Dressing: Supported sit to Pharmacist, hospital: +2 Total assistance Toilet Transfer Method: Sit to Barista: Other (comment) (from bed to recliner chair) Tub/Shower Transfer Method: Not assessed Equipment Used: Gait belt;Rolling walker Transfers/Ambulation Related to ADLs: Sit to stand  transfer +2 total A (40%).  ADL Comments: Pt at Total A level for LB ADLs and has bilateral hip precautions to maintain.  Session very limited due to pain.  Cognition: Cognition Overall Cognitive Status: Within Functional Limits for tasks assessed Arousal/Alertness: Awake/alert Orientation Level: Oriented to person;Oriented to place;Oriented to time Cognition Arousal/Alertness: Awake/alert Behavior During Therapy: WFL for tasks assessed/performed Overall Cognitive Status: Within Functional Limits for tasks assessed  Blood pressure 120/62, pulse 75, temperature 98.4 F (36.9 C), temperature source Oral, resp. rate 16, height 5\' 4"  (1.626 m), weight 63.504 kg (140 lb), SpO2 95.00%. Physical Exam  Vitals reviewed. Constitutional: She is oriented to person, place, and time. She appears well-developed and well-nourished.  HENT:  Head: Normocephalic and atraumatic.  Right Ear: External ear normal.  Left Ear: External ear normal.  Eyes: Conjunctivae and EOM are normal. Pupils are equal, round, and reactive to light. Right eye exhibits no discharge. Left eye exhibits no discharge. No scleral icterus.  Neck: Normal range of motion. Neck supple. No JVD present. No tracheal deviation present. No thyromegaly present.  Cardiovascular: Normal rate and regular rhythm.  Exam reveals no friction rub.   No murmur heard. Pulmonary/Chest: Effort  normal and breath sounds normal. No respiratory distress. She has no wheezes.  Abdominal: Soft. Bowel sounds are normal. She exhibits no distension and no mass. There is no tenderness. There is no rebound and no guarding.  Musculoskeletal:  Right hip appropriately tender. RA deformities in the hands.  Lymphadenopathy:    She has no cervical adenopathy.  Neurological: She is alert and oriented to person, place, and time.  Skin: No rash noted. No erythema.  Surgical site clean and dry  Psychiatric: She has a normal mood and affect. Her behavior is normal.  Judgment and thought content normal.    Results for orders placed during the hospital encounter of 11/25/12 (from the past 24 hour(s))  COMPREHENSIVE METABOLIC PANEL     Status: Abnormal   Collection Time    12/02/12  5:30 AM      Result Value Range   Sodium 142  135 - 145 mEq/L   Potassium 4.4  3.5 - 5.1 mEq/L   Chloride 110  96 - 112 mEq/L   CO2 24  19 - 32 mEq/L   Glucose, Bld 87  70 - 99 mg/dL   BUN 19  6 - 23 mg/dL   Creatinine, Ser 1.61  0.50 - 1.10 mg/dL   Calcium 8.5  8.4 - 09.6 mg/dL   Total Protein 5.4 (*) 6.0 - 8.3 g/dL   Albumin 2.5 (*) 3.5 - 5.2 g/dL   AST 39 (*) 0 - 37 U/L   ALT 17  0 - 35 U/L   Alkaline Phosphatase 59  39 - 117 U/L   Total Bilirubin 0.3  0.3 - 1.2 mg/dL   GFR calc non Af Amer 68 (*) >90 mL/min   GFR calc Af Amer 79 (*) >90 mL/min  PROTIME-INR     Status: Abnormal   Collection Time    12/02/12  5:30 AM      Result Value Range   Prothrombin Time 16.8 (*) 11.6 - 15.2 seconds   INR 1.40  0.00 - 1.49   Dg Femur Right  11/30/2012   *RADIOLOGY REPORT*  Clinical Data: ORIF femur fracture  RIGHT FEMUR - 2 VIEW  Comparison: 11/25/2012  Findings: Three C-arm images show placement of a laterally positioned plate with screw fixation and a proximal cerclage wire for treatment of an oblique fracture of the femur adjacent to the distal femoral stem.  Components appear grossly well positioned and alignment is anatomic.  IMPRESSION: Good appearance following ORIF for treatment of a femoral shaft fracture adjacent to the distal femoral stem.   Original Report Authenticated By: Paulina Fusi, M.D.    Assessment/Plan: Diagnosis: right, peri-prosthetic femoral shaft fx 1. Does the need for close, 24 hr/day medical supervision in concert with the patient's rehab needs make it unreasonable for this patient to be served in a less intensive setting? Yes 2. Co-Morbidities requiring supervision/potential complications:HTN, wound care, pain control 3. Due to bladder  management, bowel management, safety, skin/wound care, disease management, medication administration, pain management and patient education, does the patient require 24 hr/day rehab nursing? Yes 4. Does the patient require coordinated care of a physician, rehab nurse, PT (1-2 hrs/day, 5 days/week) and OT (1-2 hrs/day, 5 days/week) to address physical and functional deficits in the context of the above medical diagnosis(es)? Yes Addressing deficits in the following areas: balance, endurance, locomotion, strength, transferring, bowel/bladder control, bathing, dressing, feeding, grooming, toileting and psychosocial support 5. Can the patient actively participate in an intensive therapy program of at least 3  hrs of therapy per day at least 5 days per week? Yes 6. The potential for patient to make measurable gains while on inpatient rehab is excellent 7. Anticipated functional outcomes upon discharge from inpatient rehab are mod I to supervision with PT, mod I to set up with OT, n/a with SLP. 8. Estimated rehab length of stay to reach the above functional goals is: 10 to 12 days 9. Does the patient have adequate social supports to accommodate these discharge functional goals? Yes 10. Anticipated D/C setting: Home 11. Anticipated post D/C treatments: HH therapy 12. Overall Rehab/Functional Prognosis: excellent  RECOMMENDATIONS: This patient's condition is appropriate for continued rehabilitative care in the following setting: CIR Patient has agreed to participate in recommended program. Yes Note that insurance prior authorization may be required for reimbursement for recommended care.  Comment: Rehab RN to follow up.   Ranelle Oyster, MD, [place]     12/02/2012

## 2012-12-02 NOTE — Progress Notes (Signed)
Occupational Therapy Treatment Patient Details Name: Renee Adams MRN: [id number] DOB: 02-14-1934 Today's Date: 12/02/2012 Time: 6962-9528 OT Time Calculation (min): 19 min  OT Assessment / Plan / Recommendation Comments on Treatment Session Pt practiced simulated toilet transfer. Pt able to take a few steps today and required less assistance to stand.    Follow Up Recommendations  CIR    Barriers to Discharge       Equipment Recommendations  Other (comment) (tbd)    Recommendations for Other Services Rehab consult  Frequency Min 2X/week   Plan Discharge plan remains appropriate    Precautions / Restrictions Precautions Precautions: Posterior Hip;Fall Precaution Comments: Bilateral Posterior Hip precautions Restrictions Weight Bearing Restrictions: No RLE Weight Bearing: Weight bearing as tolerated Other Position/Activity Restrictions: OK for Hip ROM (within post prec), and no restrictions on knee ROM   Pertinent Vitals/Pain Pain 9/10 with movement. Premedicated.     ADL  Toilet Transfer: Simulated;+2 Total assistance Toilet Transfer: Patient Percentage: 50% Toilet Transfer Method: Sit to stand Toilet Transfer Equipment: Other (comment) (from bed ) Equipment Used: Gait belt;Rolling walker Transfers/Ambulation Related to ADLs: Pt requiring +2 total A for transfers (50%). Pt able to take a few steps today ADL Comments: Pt practiced simulated toilet transfer. OT showed pt reacher and sock aid. Pt verbalized she knew how to use this and did not feel like practicing with AE.    OT Diagnosis:    OT Problem List:   OT Treatment Interventions:     OT Goals Acute Rehab OT Goals OT Goal Formulation: With patient Time For Goal Achievement: 12/08/12 Potential to Achieve Goals: Good ADL Goals Pt Will Perform Grooming: with modified independence;Standing at sink Pt Will Perform Lower Body Bathing: with modified independence;Sit to stand from chair;with adaptive equipment Pt  Will Perform Lower Body Dressing: with modified independence;Sit to stand from bed;Sit to stand from chair;with adaptive equipment Pt Will Transfer to Toilet: with modified independence;Ambulation ADL Goal: Toilet Transfer - Progress: Progressing toward goals Pt Will Perform Toileting - Clothing Manipulation: with modified independence;Standing Pt Will Perform Toileting - Hygiene: with modified independence;Sit to stand from 3-in-1/toilet;Sitting on 3-in-1 or toilet Pt Will Perform Tub/Shower Transfer: Shower transfer;with modified independence;Ambulation Miscellaneous OT Goals Miscellaneous OT Goal #1: Pt will be able to verbalize and demonstrate 3/3 hip precautions independently. OT Goal: Miscellaneous Goal #1 - Progress: Progressing toward goals  Visit Information  Last OT Received On: 12/02/12 Assistance Needed: +2 PT/OT Co-Evaluation/Treatment: Yes    Subjective Data      Prior Functioning       Cognition  Cognition Arousal/Alertness: Awake/alert Behavior During Therapy: WFL for tasks assessed/performed Overall Cognitive Status: Within Functional Limits for tasks assessed    Mobility  Bed Mobility Bed Mobility: Supine to Sit;Sitting - Scoot to Edge of Bed Supine to Sit: 1: +2 Total assist Supine to Sit: Patient Percentage: 40% Sitting - Scoot to Edge of Bed: 1: +2 Total assist Sitting - Scoot to Edge of Bed: Patient Percentage: 30% Details for Bed Mobility Assistance: cues and extensive physical assist needed during transitions; Very painful, and had to move quite slowly; Assist needed for LE movement to EOB and to elevate trunk from bed Transfers Transfers: Sit to Stand;Stand to Sit Sit to Stand: 1: +2 Total assist;With upper extremity assist;From bed Sit to Stand: Patient Percentage: 50% Stand to Sit: 1: +2 Total assist;With upper extremity assist;To chair/3-in-1 Stand to Sit: Patient Percentage: 50% Details for Transfer Assistance: Cues to preposition stronger LLE  closer to  base of support for better success with sit to stand; continues to require bilateral support for lift from bed    Exercises      Balance     End of Session OT - End of Session Equipment Utilized During Treatment: Gait belt Activity Tolerance: Patient limited by pain Patient left: in chair;with call bell/phone within reach  GO     Earlie Raveling OTR/L [phone] 12/02/2012, 4:37 PM

## 2012-12-02 NOTE — PMR Pre-admission (Signed)
PMR Admission Coordinator Pre-Admission Assessment  Patient: Renee Adams is an 79 y.o., female MRN: [id number] DOB: 06/12/1934 Height: 5\' 4"  (162.6 cm) Weight: 63.504 kg (140 lb)              Insurance Information PRIMARY: Medicare      Policy#: [id number]      Subscriber: self Benefits: [place] Effective Date: 06/08/2001 Deductible: $1126 OOP Max: None Life Max: Unlimited CIR: 100% SNF: 100 days LBD:  Outpatient: 80% Copay: 20%  Home Health: 100% DME: 80%  Copay: 20% Providers: Patient's choice  Emergency Contact Information Contact Information   Name Relation Home Work [place], [place] Daughter   [phone]   [place], [place] [phone]  [phone]     Current Medical History  Patient Admitting Diagnosis: Right, peri-prosthetic femoral shaft fx  History of Present Illness:78 y.o. right-handed female with history of ASD and TIA maintained on chronic Coumadin as well as right total hip arthroplasty years ago as well as revision total knee in the last couple years. Patient lives alone and was independent prior to admission. Admitted 11/26/2012 after a fall sustaining a small spiral fracture at the tip of the total hip stem. INR upon admission 1.94. Patient underwent ORIF of periprosthetic femur fracture 12/01/2012 per Dr. [name]. Patient is weightbearing as tolerated with posterior hip precautions. Chronic Coumadin resumed with subcutaneous Lovenox for INR greater than 2.00. Postoperative pain control.     Past Medical History  Past Medical History  Diagnosis Date  . GERD (gastroesophageal reflux disease)   . Glaucoma   . Cancer   . Hypertension   . Depression    Family History  family history includes Cancer in her mother.  Prior Rehab/Hospitalizations: [facility] 2 years ago after knee surgery   Current Medications  Current facility-administered medications:acetaminophen (TYLENOL) suppository 650 mg, 650 mg, Rectal, Q6H PRN, [name], PA-C;   acetaminophen (TYLENOL) tablet 650 mg, 650 mg, Oral, Q6H PRN, [name], PA-C;  ALPRAZolam Prudy Feeler) tablet 1 mg, 1 mg, Oral, Q4H PRN, Lars Mage, MD;  alum & mag hydroxide-simeth (MAALOX/MYLANTA) 200-200-20 MG/5ML suspension 30 mL, 30 mL, Oral, Q4H PRN, [name], PA-C amLODipine (NORVASC) tablet 5 mg, 5 mg, Oral, Daily, Lars Mage, MD, 5 mg at 12/02/12 1056;  atorvastatin (LIPITOR) tablet 10 mg, 10 mg, Oral, Daily, Lars Mage, MD, 10 mg at 12/02/12 1056;  bisacodyl (DULCOLAX) EC tablet 5 mg, 5 mg, Oral, Daily PRN, [name], PA-C;  bisacodyl (DULCOLAX) suppository 10 mg, 10 mg, Rectal, Once, Ripudeep Jenna Luo, MD buPROPion (WELLBUTRIN XL) 24 hr tablet 300 mg, 300 mg, Oral, Daily, Lars Mage, MD, 300 mg at 12/02/12 1056;  calcium carbonate (OS-CAL - dosed in mg of elemental calcium) tablet 1,250 mg, 1,250 mg, Oral, BID WC, Lars Mage, MD, 1,250 mg at 12/02/12 0807;  docusate sodium (COLACE) capsule 100 mg, 100 mg, Oral, BID, [name], PA-C, 100 mg at 12/02/12 1056 enoxaparin (LOVENOX) injection 40 mg, 40 mg, Subcutaneous, Q24H, Clarene Critchley, PA-C, 40 mg at 12/02/12 0102;  fentaNYL (SUBLIMAZE) injection 50 mcg, 50 mcg, Intravenous, Q2H PRN, Lars Mage, MD, 50 mcg at 12/02/12 1148;  HYDROcodone-acetaminophen (NORCO/VICODIN) 5-325 MG per tablet 1-2 tablet, 1-2 tablet, Oral, Q6H PRN, [name], PA-C, 2 tablet at 12/02/12 0807 lactose free nutrition (BOOST PLUS) liquid 237 mL, 237 mL, Oral, BID BM, Heather Cornelison Pitts, RD, 237 mL at 12/02/12 1000;  latanoprost (XALATAN) 0.005 % ophthalmic solution 1 drop, 1 drop, Both Eyes, QHS, Lars Mage, MD, 1  drop at 12/01/12 2134;  menthol-cetylpyridinium (CEPACOL) lozenge 3 mg, 1 lozenge, Oral, PRN, [name], PA-C;  methocarbamol (ROBAXIN) 500 mg in dextrose 5 % 50 mL IVPB, 500 mg, Intravenous, Q6H PRN, Lars Mage, MD methocarbamol (ROBAXIN) tablet 500 mg, 500 mg, Oral, Q6H PRN, [name], PA-C, 500 mg at 12/01/12 0530;  metoCLOPramide (REGLAN)  injection 5-10 mg, 5-10 mg, Intravenous, Q8H PRN, [name], PA-C;  metoCLOPramide (REGLAN) tablet 5-10 mg, 5-10 mg, Oral, Q8H PRN, [name], PA-C;  ondansetron [facility]) injection 4 mg, 4 mg, Intravenous, Q6H PRN, [name], PA-C ondansetron [facility]) tablet 4 mg, 4 mg, Oral, Q6H PRN, [name], PA-C, 4 mg at 11/28/12 1048;  phenol (CHLORASEPTIC) mouth spray 1 spray, 1 spray, Mouth/Throat, PRN, [name], PA-C, 1 spray at 12/01/12 2134;  polyethylene glycol (MIRALAX / GLYCOLAX) packet 17 g, 17 g, Oral, Daily PRN, Ripudeep K Rai, MD, 17 g at 11/29/12 1136;  rOPINIRole (REQUIP) tablet 1 mg, 1 mg, Oral, QHS PRN, Lars Mage, MD, 1 mg at 11/27/12 2252 sertraline (ZOLOFT) tablet 150 mg, 150 mg, Oral, Daily, Lars Mage, MD, 150 mg at 12/02/12 1056;  warfarin (COUMADIN) tablet 3 mg, 3 mg, Oral, ONCE-1800, Kendra P Hiatt, RPH;  Warfarin - Pharmacist Dosing Inpatient, , Does not apply, q1800, Herby Abraham, RPH;  zolpidem (AMBIEN) tablet 5 mg, 5 mg, Oral, QHS PRN, [name], PA-C  Patients Current Diet: General  Precautions / Restrictions Precautions Precautions: Posterior Hip;Fall Precaution Comments: Bilateral Posterior Hip precautions Restrictions Weight Bearing Restrictions: No RLE Weight Bearing: Weight bearing as tolerated Other Position/Activity Restrictions: OK for Hip ROM (within post prec), and no restrictions on knee ROM   Prior Activity Level Community (5-7x/wk): Active daily Home Assistive Devices / Equipment Home Assistive Devices/Equipment: None Home Adaptive Equipment: Walker - rolling;Raised toilet seat with rails;Reacher;Sock aid  Prior Functional Level Prior Function Level of Independence: Independent Able to Take Stairs?: Yes Driving: Yes Vocation: Retired  Current Functional Level Cognition  Arousal/Alertness: Awake/alert Overall Cognitive Status: Within Functional Limits for tasks assessed Orientation Level: Oriented to person;Oriented to  place;Oriented to time    Extremity Assessment (includes Sensation/Coordination)  RUE ROM/Strength/Tone: WFL for tasks assessed  RLE ROM/Strength/Tone: Deficits RLE ROM/Strength/Tone Deficits: Decr AROM and strength, limited by pain postop; Able to activate quad for quad setting therex    ADLs  Eating/Feeding: Independent Where Assessed - Eating/Feeding: Chair Grooming: Set up Where Assessed - Grooming: Supported sitting Upper Body Bathing: Set up Where Assessed - Upper Body Bathing: Supported sitting Lower Body Bathing: +2 Total assistance Lower Body Bathing: Patient Percentage: 10% Where Assessed - Lower Body Bathing: Supported sit to stand Upper Body Dressing: Set up Where Assessed - Upper Body Dressing: Supported sitting Lower Body Dressing: +2 Total assistance Lower Body Dressing: Patient Percentage: 10% Where Assessed - Lower Body Dressing: Supported sit to Pharmacist, hospital: +2 Total assistance Toilet Transfer: Patient Percentage: 40% Statistician Method: Sit to Barista: Other (comment) (from bed to recliner chair) Toileting - Clothing Manipulation and Hygiene: +2 Total assistance Toileting - Clothing Manipulation and Hygiene: Patient Percentage: 0% Where Assessed - Toileting Clothing Manipulation and Hygiene: Other (comment) (sit to stand from bed) Tub/Shower Transfer Method: Not assessed Equipment Used: Gait belt;Rolling walker Transfers/Ambulation Related to ADLs: Sit to stand transfer +2 total A (40%).  ADL Comments: Pt at Total A level for LB ADLs and has bilateral hip precautions to maintain.  Session very limited due to pain.    Mobility  Bed Mobility:  Supine to Sit;Sitting - Scoot to Edge of Bed Supine to Sit: 1: +2 Total assist Supine to Sit: Patient Percentage: 40% Sitting - Scoot to Edge of Bed: 1: +2 Total assist Sitting - Scoot to Edge of Bed: Patient Percentage: 30% Sit to Supine: 2: Max assist;HOB flat    Transfers   Transfers: Sit to Stand;Stand to [employer] Transfers Sit to Stand: 1: +2 Total assist;With upper extremity assist;From bed Sit to Stand: Patient Percentage: 40% Stand to Sit: 1: +2 Total assist;With upper extremity assist;To chair/3-in-1 Stand to Sit: Patient Percentage: 40% Stand Pivot Transfers:  (Attempted, but pt quite painful and unable) Stand Pivot Transfers: Patient Percentage: 60%    Ambulation / Gait / Stairs / Wheelchair Mobility  Ambulation/Gait Ambulation/Gait Assistance: Not tested (comment) Ambulation Distance (Feet): 1 Feet Assistive device: Rolling walker Ambulation/Gait Assistance Details: Pt only able to take 1 step forward.  Pt unable to maintain TDWB on RLE and reverted to NWB.   Gait Pattern: Step-to pattern Stairs: No Wheelchair Mobility Wheelchair Mobility: No    Posture / Balance Static Sitting Balance Static Sitting - Balance Support: Feet supported;No upper extremity supported Static Sitting - Level of Assistance: 7: Independent    Special needs/care consideration Continuous Drip IV: yes Skin: R hip incision with comppresion wrap Bowel mgmt: LBM 11/29/12 Bladder mgmt: WDL + MRSA   Previous Home Environment Living Arrangements: Alone Lives With: Alone Available Help at Discharge: Family;Available 24 hours/day Type of Home: House Home Layout: One level (1 step to get into den) Home Access: Stairs to enter Entrance Stairs-Rails: None Entrance Stairs-Number of Steps: 1 Bathroom Shower/Tub: Psychologist, counselling;Door [employer] Toilet: Handicapped height Home Care Services: No  Discharge Living Setting Plans for Discharge Living Setting: Alone Type of Home at Discharge: House Discharge Home Layout: One level;Laundry or work area in basement Discharge Home Access: Stairs to enter Entrance Stairs-Rails: None Secretary/administrator of Steps: 1 Discharge Bathroom Shower/Tub: Garment/textile technologist Toilet: Handicapped height Discharge Bathroom  Accessibility: Yes How Accessible: Accessible via walker Do you have any problems obtaining your medications?: No  Social/Family/Support Systems Patient Roles: Parent (Widowed) Contact Information: [phone] Anticipated Caregiver: Goal is mod independent. Daughter to come stay with pt after d.c as needed. Anticipated Caregiver's Contact Information: Julianne Rice: [phone] Ability/Limitations of Caregiver: none Caregiver Availability: Intermittent (as needed) Discharge Plan Discussed with Primary Caregiver: Yes Is Caregiver In Agreement with Plan?: Yes Does Caregiver/Family have Issues with Lodging/Transportation while Pt is in Rehab?: No   Goals/Additional Needs Patient/Family Goal for Rehab: Modified independent Expected length of stay: 10-12 days Cultural Considerations: none Dietary Needs: Regular Equipment Needs: TBD Pt/Family Agrees to Admission and willing to participate: Yes Program Orientation Provided & Reviewed with Pt/Caregiver Including Roles  & Responsibilities: Yes   Decrease burden of Care through IP rehab admission: n/a   Possible need for SNF placement upon discharge: no   Patient Condition: This patient's condition remains as documented in the consult dated 12/02/12, in which the Rehabilitation Physician determined and documented that the patient's condition is appropriate for intensive rehabilitative care in an inpatient rehabilitation facility. Will admit to inpatient rehab today.  Preadmission Screen Completed By:  Meryl Dare, 12/02/2012 2:00 PM ______________________________________________________________________   Discussed status with Dr. Riley Kill on 12/02/12 at 1405 and received telephone approval for admission today.  Admission Coordinator:  Meryl Dare, time 1413/Date 12/02/12

## 2012-12-02 NOTE — Progress Notes (Signed)
Pt admitted to room 4010, pt alert and oriented, son present at bedside, pt and son oriented to room, rehab unit, rehab schedule, and safety plan, pt resting in bed with call bell in reach

## 2012-12-02 NOTE — Plan of Care (Signed)
Overall Plan of Care [facility]) Patient Details Name: Renee Adams MRN: [id number] DOB: 04/29/1934  Diagnosis:  Right periprosthetic femoral shaft fx  Co-morbidities: pain, anxiety, htn,   Functional Problem List  Patient demonstrates impairments in the following areas: Balance, Edema, Endurance, Medication Management, Motor, Pain, Safety and Skin Integrity  Basic ADL's: grooming, bathing, dressing and toileting Advanced ADL's: simple meal preparation  Transfers:  bed mobility, bed to chair, toilet, tub/shower, car and furniture Locomotion:  ambulation, wheelchair mobility and stairs  Additional Impairments:  Leisure Awareness and Discharge Disposition  Anticipated Outcomes Item Anticipated Outcome  Eating/Swallowing    Basic self-care  Setup   Tolieting  Mod I   Bowel/Bladder  Pt continent of bowel and bladder  Transfers  Mod I  Locomotion  Mod I  Communication    Cognition    Pain  Pt will rate pain of less than 3 on scale of 0-10  Safety/Judgment  Pt will call for assist and remain free from falls during admission  Other     Therapy Plan: PT Intensity: Minimum of 1-2 x/day ,45 to 90 minutes PT Frequency: 5 out of 7 days PT Duration Estimated Length of Stay: 10-14 days OT Intensity: Minimum of 1-2 x/day, 45 to 90 minutes OT Frequency: 5 out of 7 days OT Duration/Estimated Length of Stay: 1-2 weeks      Team Interventions: Item RN PT OT SLP SW TR Other  Self Care/Advanced ADL Retraining   x      Neuromuscular Re-Education  x x      Therapeutic Activities  x x      UE/LE Strength Training/ROM  x x      UE/LE Coordination Activities  x x      Visual/Perceptual Remediation/Compensation         DME/Adaptive Equipment Instruction  x x      Therapeutic Exercise  x x      Balance/Vestibular Training  x x      Patient/Family Education  x x      Cognitive Remediation/Compensation         Functional Mobility Training  x x      Ambulation/Gait Training  x        Museum/gallery curator  x       Wheelchair Propulsion/Positioning  x       Functional Tourist information centre manager Reintegration  x x      Dysphagia/Aspiration Film/video editor         Bladder Management         Bowel Management         Disease Management/Prevention         Pain Management x x x      Medication Management x        Skin Care/Wound Management         Splinting/Orthotics  x x      Discharge Planning  x x      Psychosocial Support x                               Team Discharge Planning: Destination: PT-Home ,OT- Home , SLP-  Projected Follow-up: PT-Home health PT, OT-  Home health OT, SLP-  Projected Equipment Needs: PT- , OT-  , SLP-  Patient/family involved in discharge planning: PT- Patient,  OT-Patient, SLP-  MD ELOS: 10-12 days Medical Rehab Prognosis:  Excellent Assessment: The patient has been admitted for CIR therapies. The team will be addressing, functional mobility, strength, stamina, balance, safety, adaptive techniques/equipment, self-care, bowel and bladder mgt, patient and caregiver education, pain mgt, activity tolerance, ortho precautions. Goals have been set at mod I to set up.    Ranelle Oyster, MD, FAAPMR      See Team Conference Notes for weekly updates to the plan of care

## 2012-12-03 ENCOUNTER — Inpatient Hospital Stay (HOSPITAL_COMMUNITY): Payer: Medicare Other | Admitting: Occupational Therapy

## 2012-12-03 ENCOUNTER — Inpatient Hospital Stay (HOSPITAL_COMMUNITY): Payer: Medicare Other | Admitting: Physical Therapy

## 2012-12-03 ENCOUNTER — Inpatient Hospital Stay (HOSPITAL_COMMUNITY): Payer: Medicare Other

## 2012-12-03 DIAGNOSIS — F341 Dysthymic disorder: Secondary | ICD-10-CM

## 2012-12-03 DIAGNOSIS — I10 Essential (primary) hypertension: Secondary | ICD-10-CM

## 2012-12-03 DIAGNOSIS — S72001A Fracture of unspecified part of neck of right femur, initial encounter for closed fracture: Secondary | ICD-10-CM

## 2012-12-03 DIAGNOSIS — S72309A Unspecified fracture of shaft of unspecified femur, initial encounter for closed fracture: Secondary | ICD-10-CM

## 2012-12-03 DIAGNOSIS — W19XXXA Unspecified fall, initial encounter: Secondary | ICD-10-CM

## 2012-12-03 LAB — COMPREHENSIVE METABOLIC PANEL
Albumin: 2.5 g/dL — ABNORMAL LOW (ref 3.5–5.2)
Alkaline Phosphatase: 73 U/L (ref 39–117)
BUN: 15 mg/dL (ref 6–23)
Calcium: 9 mg/dL (ref 8.4–10.5)
Creatinine, Ser: 0.83 mg/dL (ref 0.50–1.10)
GFR calc Af Amer: 77 mL/min — ABNORMAL LOW (ref >90)
Glucose, Bld: 78 mg/dL (ref 70–99)
Total Protein: 5.4 g/dL — ABNORMAL LOW (ref 6.0–8.3)

## 2012-12-03 LAB — CBC WITH DIFFERENTIAL/PLATELET
Basophils Relative: 1 % (ref 0–1)
Eosinophils Absolute: 0.4 10*3/uL (ref 0.0–0.7)
Eosinophils Relative: 8 % — ABNORMAL HIGH (ref 0–5)
Hemoglobin: 8.7 g/dL — ABNORMAL LOW (ref 12.0–15.0)
Lymphs Abs: 0.9 10*3/uL (ref 0.7–4.0)
MCH: 28.9 pg (ref 26.0–34.0)
MCHC: 32.6 g/dL (ref 30.0–36.0)
MCV: 88.7 fL (ref 78.0–100.0)
Monocytes Relative: 7 % (ref 3–12)
RBC: 3.01 MIL/uL — ABNORMAL LOW (ref 3.87–5.11)

## 2012-12-03 LAB — PROTIME-INR
INR: 2.15 — ABNORMAL HIGH (ref 0.00–1.49)
Prothrombin Time: 23.1 seconds — ABNORMAL HIGH (ref 11.6–15.2)

## 2012-12-03 LAB — URINALYSIS, ROUTINE W REFLEX MICROSCOPIC
Bilirubin Urine: NEGATIVE
Glucose, UA: NEGATIVE mg/dL
Nitrite: NEGATIVE
Specific Gravity, Urine: 1.012 (ref 1.005–1.030)
pH: 8 (ref 5.0–8.0)

## 2012-12-03 MED ORDER — METHOCARBAMOL 500 MG PO TABS
500.0000 mg | ORAL_TABLET | Freq: Four times a day (QID) | ORAL | Status: DC | PRN
  Administered 2012-12-04 – 2012-12-11 (×9): 500 mg via ORAL
  Filled 2012-12-03 (×11): qty 1

## 2012-12-03 MED ORDER — WARFARIN SODIUM 3 MG PO TABS
3.0000 mg | ORAL_TABLET | ORAL | Status: DC
  Administered 2012-12-04 – 2012-12-08 (×3): 3 mg via ORAL
  Filled 2012-12-03 (×5): qty 1

## 2012-12-03 MED ORDER — WARFARIN SODIUM 2 MG PO TABS
2.0000 mg | ORAL_TABLET | ORAL | Status: DC
  Administered 2012-12-03 – 2012-12-11 (×5): 2 mg via ORAL
  Filled 2012-12-03 (×6): qty 1

## 2012-12-03 MED ORDER — HYDROCODONE-ACETAMINOPHEN 5-325 MG PO TABS
1.0000 | ORAL_TABLET | Freq: Two times a day (BID) | ORAL | Status: DC
  Administered 2012-12-03 – 2012-12-05 (×4): 1 via ORAL
  Filled 2012-12-03 (×2): qty 1

## 2012-12-03 MED ORDER — BOOST PLUS PO LIQD
120.0000 mL | Freq: Three times a day (TID) | ORAL | Status: DC
  Administered 2012-12-03 – 2012-12-16 (×39): 120 mL via ORAL
  Filled 2012-12-03 (×68): qty 237

## 2012-12-03 NOTE — Progress Notes (Signed)
Patient information reviewed and entered into eRehab system by Chardae Mulkern, RN, CRRN, PPS Coordinator.  Information including medical coding and functional independence measure will be reviewed and updated through discharge.     Per nursing patient was given "Data Collection Information Summary for Patients in Inpatient Rehabilitation Facilities with attached "Privacy Act Statement-Health Care Records" upon admission.  

## 2012-12-03 NOTE — Progress Notes (Signed)
Occupational Therapy Session Note  Patient Details  Name: Renee Adams MRN: [id number] Date of Birth: 1934/03/15  Today's Date: 12/03/2012 Time: 1430-1500 Time Calculation (min): 30 min  Short Term Goals: Week 1:  OT Short Term Goal 1 (Week 1): Transfer to BSC/ commode with mod A  OT Short Term Goal 2 (Week 1): Perform sit to stand with mod A OT Short Term Goal 3 (Week 1): Perform standing balance with mod A OT Short Term Goal 4 (Week 1): Perform clothing management with mod A for dressing and toileting  Skilled Therapeutic Interventions/Progress Updates:    Therapy session focused on sit<>stand transfers, dynamic standing balance, and activity tolerance during ADL tasks. Pt requires increased time for sit<>stand transfers and mod assist. Once in standing pt requires steadying assist as she is fearful of falling and pain increases anxiety. Initially unable to complete sit<>stand secondary to pain however completed full transfer 2/4 attempts. Pt became tearful from pain after first attempt. Sustained standing balance approx 3 min while completing oral care. Pt very hesitant to engage in functional activity when standing however willing to after encouragement. Required long rest break then agreed to changing from hospital gown. Required min assist to hook bra then pt able to thread arms into straps and gown. Required increased time to fasten front snaps secondary to arthritis. Pt reported feeling tired at end of therapy session.   Therapy Documentation Precautions:  Precautions Precautions: Posterior Hip;Fall Precaution Comments: Bilateral Posterior Hip precautions Restrictions Weight Bearing Restrictions: No RLE Weight Bearing: Weight bearing as tolerated General:   Vital Signs:   Pain: Pain in Rt hip and knee rated as 3/10 in sitting and 7/10 with movement.   See FIM for current functional status  Therapy/Group: Individual Therapy  Daneil Dan 12/03/2012, 3:08 PM

## 2012-12-03 NOTE — Evaluation (Signed)
Physical Therapy Assessment and Plan  Patient Details  Name: Renee Adams MRN: [id number] Date of Birth: [birth year]  PT Diagnosis: Abnormality of gait, Difficulty walking, Muscle weakness and Pain in hip Rehab Potential: Good ELOS: 10-14 days   Today's Date: 12/03/2012 Time: 0800-0900 Time Calculation (min): 60 min  Problem List:  Patient Active Problem List   Diagnosis Date Noted  . Fracture of femoral neck, right 12/03/2012  . Fall 11/26/2012  . Fracture of femoral shaft, right, closed 11/26/2012  . UTI (urinary tract infection) 11/26/2012  . Essential hypertension, benign 08/18/2012  . Status post left hip replacement 04/18/2012  . Osteomyelitis of left hip 03/31/2012  . Breast cancer, left breast 09/12/2011    Past Medical History:  Past Medical History  Diagnosis Date  . GERD (gastroesophageal reflux disease)   . Glaucoma   . Cancer   . Hypertension   . Depression    Past Surgical History:  Past Surgical History  Procedure Laterality Date  . Total knee arthroplasty Bilateral   . Total hip arthroplasty Bilateral   . Mastectomy  1996    left breast lumpectomy   . Incision and drainage hip  03/31/2012    Procedure: IRRIGATION AND DEBRIDEMENT HIP WITH POLY EXCHANGE;  Surgeon: Raymon Mutton, MD;  Location: [facility] OR;  Service: Orthopedics;  Laterality: Left;    Assessment & Plan Clinical Impression: Patient is a 79 y.o. year old female with recent admission to the hospital on 11/26/2012 after a fall sustaining a small spiral fracture at the tip of the total hip stem. INR upon admission 1.94. Patient underwent ORIF of periprosthetic femur fracture 12/01/2012 per Dr. [name].  Patient transferred to CIR on 12/02/2012 .   Patient currently requires mod with mobility secondary to muscle weakness and decreased standing balance.  Prior to hospitalization, patient was independent  with mobility and lived with Alone in a House home.  Home access is 2Stairs to enter.  Patient  will benefit from skilled PT intervention to maximize safe functional mobility, minimize fall risk and decrease caregiver burden for planned discharge home with intermittent assist.  Anticipate patient will benefit from follow up [facility] at discharge.  PT - End of Session Activity Tolerance: Tolerates 30+ min activity with multiple rests Endurance Deficit: Yes PT Assessment Rehab Potential: Good Barriers to Discharge: Decreased caregiver support PT Plan PT Intensity: Minimum of 1-2 x/day ,45 to 90 minutes PT Frequency: 5 out of 7 days PT Duration Estimated Length of Stay: 10-14 days PT Treatment/Interventions: Ambulation/gait training;Balance/vestibular training;Community reintegration;Patient/family education;Stair training;Splinting/orthotics;Pain management;DME/adaptive equipment instruction;Neuromuscular re-education;Functional mobility training;Discharge planning;Therapeutic Exercise;Therapeutic Activities;Wheelchair propulsion/positioning;UE/LE Strength taining/ROM;UE/LE Coordination activities PT Recommendation Follow Up Recommendations: Home health PT Patient destination: Home  Skilled Therapeutic Intervention Standing balance/tolerance in parallel bars 2 x 5 minutes with gentle wt shifts and small steps fwd/bkwd to decrease pt anxiety with wt bearing.  Sit to stand training from bed at elevated height with min-mod A, sit to stand pulling on parallel bars with min-mod A, cues for forward wt shift and R LE foot placement to reduce pain.  Pt limited by pain and anxiety requiring multiple rest breaks.  PT Evaluation Precautions/Restrictions Precautions Precautions: Posterior Hip;Fall Restrictions RLE Weight Bearing: Weight bearing as tolerated Pain Pain Assessment Pain Assessment: 0-10 Pain Score:   8 Pain Type: Surgical pain Pain Location: Hip Pain Orientation: Right Pain Descriptors / Indicators: Aching Pain Onset: With Activity Pain Intervention(s): RN made  aware;Repositioned Home Living/Prior Functioning Home Living Lives With: Alone Available Help at  Discharge: Family;Available 24 hours/day Type of Home: House Home Access: Stairs to enter [employer] of Steps: 2 Entrance Stairs-Rails: None Home Layout: One level Home Adaptive Equipment: Walker - rolling;Straight cane Prior Function Level of Independence: Independent with basic ADLs;Independent with transfers;Independent with gait Able to Take Stairs?: Yes Driving: Yes Vocation: Retired  IT consultant Overall Cognitive Status: Within Functional Limits for tasks assessed Sensation Sensation Light Touch: Appears Intact Proprioception: Appears Intact Coordination Gross Motor Movements are Fluid and Coordinated: Yes Fine Motor Movements are Fluid and Coordinated: Yes Motor  Motor Motor - Skilled Clinical Observations: generalized weakness  Mobility Bed Mobility Supine to Sit: 3: Mod assist;HOB flat Supine to Sit Details (indicate cue type and reason): Pt requires increased time and encouragement due to pain and anxiety, assist for L LE out of bed and lifting assist at trunk Transfers Stand Pivot Transfers: 3: Mod assist Stand Pivot Transfer Details (indicate cue type and reason): lifting assist from bed, cuing for sequencing and stepping with RW due to pain and anxiety, increased time, decreased wt bearing on R LE Locomotion  Ambulation Ambulation: Yes Ambulation/Gait Assistance: 3: Mod assist Ambulation Distance (Feet): 4 Feet Assistive device: Rolling walker Ambulation/Gait Assistance Details: pivoting steps to w/c, cues to bear wt through UEs to decrease pain in R LE, cues for sequencing due to increased anxiety with pain Wheelchair Mobility Wheelchair Mobility: Yes Wheelchair Assistance: 5: Supervision Wheelchair Propulsion: Both upper extremities Wheelchair Parts Management: Needs assistance Distance: 40  Trunk/Postural Assessment  Cervical Assessment Cervical  Assessment: Within Functional Limits Thoracic Assessment Thoracic Assessment:  (mild kyphosis) Lumbar Assessment Lumbar Assessment:  (ROM limited by pain) Postural Control Postural Control: Within Functional Limits  Balance Static Sitting Balance Static Sitting - Level of Assistance: 7: Independent Static Standing Balance Static Standing - Balance Support: During functional activity Static Standing - Level of Assistance: 5: Stand by assistance Dynamic Standing Balance Dynamic Standing - Balance Support: During functional activity Dynamic Standing - Level of Assistance: 3: Mod assist Extremity Assessment      RLE Assessment RLE Assessment:  (grossly 2+/5 knee and hip limited by pain, ankle WFL) LLE Assessment LLE Assessment:  (grossly 3/5)  FIM:  FIM - Bed/Chair Transfer Bed/Chair Transfer: 3: Supine > Sit: Mod A (lifting assist/Pt. 50-74%/lift 2 legs;3: Sit > Supine: Mod A (lifting assist/Pt. 50-74%/lift 2 legs);3: Chair or W/C > Bed: Mod A (lift or lower assist);3: Bed > Chair or W/C: Mod A (lift or lower assist) FIM - Locomotion: Wheelchair Distance: 40 Locomotion: Wheelchair: 1: Travels less than 50 ft with supervision, cueing or coaxing FIM - Locomotion: Ambulation Ambulation/Gait Assistance: 3: Mod assist Locomotion: Ambulation: 1: Travels less than 50 ft with minimal assistance (Pt.>75%) FIM - Locomotion: Stairs Locomotion: Stairs: 0: Activity did not occur (pt unable to attempt on eval due to pain)   Refer to Care Plan for Long Term Goals  Recommendations for other services: None  Discharge Criteria: Patient will be discharged from PT if patient refuses treatment 3 consecutive times without medical reason, if treatment goals not met, if there is a change in medical status, if patient makes no progress towards goals or if patient is discharged from hospital.  The above assessment, treatment plan, treatment alternatives and goals were discussed and mutually agreed upon:  by patient  [facility] [place] 12/03/2012, 9:21 AM

## 2012-12-03 NOTE — Progress Notes (Signed)
Occupational Therapy Note  Patient Details  Name: Renee Adams MRN: [id number] Date of Birth: Jan 26, 1934 Today's Date: 12/03/2012  Time: 1345-1430 Pt c/o 8/10 pain in right hip with activity; RN aware and repositioned Individual Therapy  Pt in bed resting upon arrival.  Pt stated that she needed to use the bedpan.  I encourage patient to transfer to w/c and then to BSC/toilet.  Pt declined and again requested use of bedpan.  Pt required mod A to roll for placement of bedpan and removal of bedpan.  After using bedpan patient sat EOB with min A to move RLE when scooting/rolling to EOB.  Pt required extra time to complete supine<>sit and c/o increased pain with movement.  Pt performed stand pivot transfer with RW and required mod A and extra time for transfer.  Pt again c/o increased pain with movement.  Pt required rest break after sitting EOB before performing transfer.  Pt performed sit<>stand X 4 from w/c with mod A.  Lavone Neri [facility] 12/03/2012, 2:42 PM

## 2012-12-03 NOTE — Progress Notes (Signed)
Subjective/Complaints: Right leg tender. Urinary incontinence x 2 last night A 12 point review of systems has been performed and if not noted above is otherwise negative.   Objective: Vital Signs: Blood pressure 115/68, pulse 94, temperature 98.5 F (36.9 C), temperature source Oral, resp. rate 18, weight 64.5 kg (142 lb 3.2 oz), SpO2 97.00%. No results found.  Recent Labs  12/02/12 0954 12/03/12 0548  WBC 5.7 5.4  HGB 8.1* 8.7*  HCT 24.4* 26.7*  PLT 166 200    Recent Labs  12/02/12 0530 12/03/12 0548  NA 142 142  K 4.4 4.2  CL 110 105  GLUCOSE 87 78  BUN 19 15  CREATININE 0.82 0.83  CALCIUM 8.5 9.0   CBG (last 3)  No results found for this basename: GLUCAP,  in the last 72 hours  Wt Readings from Last 3 Encounters:  12/02/12 64.5 kg (142 lb 3.2 oz)  11/28/12 63.504 kg (140 lb)  11/28/12 63.504 kg (140 lb)    Physical Exam:  Constitutional: She is oriented to person, place, and time. She appears well-developed and well-nourished.  HENT:  Head: Normocephalic and atraumatic.  Right Ear: External ear normal.  Left Ear: External ear normal.  Eyes: Conjunctivae and EOM are normal. Pupils are equal, round, and reactive to light. Right eye exhibits no discharge. Left eye exhibits no discharge. No scleral icterus.  Neck: Normal range of motion. Neck supple. No JVD present. No tracheal deviation present. No thyromegaly present.  Cardiovascular: Normal rate and regular rhythm. Exam reveals no friction rub.  No murmur heard.  Pulmonary/Chest: Effort normal and breath sounds normal. No respiratory distress. She has no wheezes.  Abdominal: Soft. Bowel sounds are normal. She exhibits no distension and no mass. There is no tenderness. There is no rebound and no guarding.  Musculoskeletal:    Joint deformities in the hands.  Lymphadenopathy:  She has no cervical adenopathy.  Neurological: She is alert and oriented to person, place, and time. UE is 4/5. RLE is 2-3/5 (pain)  proximally to 4-/5 distally. LLE is 4/5 proximally to distally.  Skin: No rash noted. No erythema.  Surgical site clean and dry with staples. Swelling noted with bruising around incision site. Right thigh is tender. Psychiatric: She has a normal mood and affect. Her behavior is normal. Judgment and thought content normal    Assessment/Plan: 1. Functional deficits secondary to right peri-prosthetic femoral shaft fx which require 3+ hours per day of interdisciplinary therapy in a comprehensive inpatient rehab setting. Physiatrist is providing close team supervision and 24 hour management of active medical problems listed below. Physiatrist and rehab team continue to assess barriers to discharge/monitor patient progress toward functional and medical goals. FIM:                   Comprehension Comprehension Mode: Auditory Comprehension: 5-Follows basic conversation/direction: With no assist  Expression Expression Mode: Verbal Expression: 6-Expresses complex ideas: With extra time/assistive device  Social Interaction Social Interaction: 6-Interacts appropriately with others with medication or extra time (anti-anxiety, antidepressant).  Problem Solving Problem Solving: 5-Solves basic problems: With no assist  Memory Memory: 5-Recognizes or recalls 90% of the time/requires cueing < 10% of the time  Medical Problem List and Plan:  1. Right periprosthetic femoral shaft fracture. Status post ORIF 12/01/2012.  2. DVT Prophylaxis/Anticoagulation: Chronic Coumadin therapy. INR 1.40. Subcutaneous Lovenox for INR greater than 2.00  3. Pain Management: Hydrocodone and Robaxin as needed.  -schedule hydrocodone before am and pm therapy 4. Mood:  Xanax 1 mg every 4 hours as needed. Wellbutrin 300 mg daily, Zoloft 150 mg daily. Provide emotional support  5. Neuropsych: This patient is capable of making decisions on her own behalf.  6. Hypertension. Norvasc 5 mg daily. Monitor for the  effects of increased mobility and pain upon her BP.  7. Hyperlipidemia. Lipitor  8. 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 Urinary incontinence.   -is seen by urology as an outpt-- UDS were planned  -check PVR's and urine spec today  LOS (Days) 1 A FACE TO FACE EVALUATION WAS PERFORMED  SWARTZ,ZACHARY T 12/03/2012 9:04 AM

## 2012-12-03 NOTE — Evaluation (Signed)
Occupational Therapy Assessment and Plan  Patient Details  Name: Renee Adams MRN: [id number] Date of Birth: Jan 23, 1934  OT Diagnosis: acute pain and muscle weakness (generalized) Rehab Potential: Rehab Potential: Fair ELOS: 1-2 weeks   Today's Date: 12/03/2012 Time: 1000-1030 Time Calculation (min): 30 min  Problem List:  Patient Active Problem List   Diagnosis Date Noted  . Fracture of femoral neck, right 12/03/2012  . Fall 11/26/2012  . Fracture of femoral shaft, right, closed 11/26/2012  . UTI (urinary tract infection) 11/26/2012  . Essential hypertension, benign 08/18/2012  . Status post left hip replacement 04/18/2012  . Osteomyelitis of left hip 03/31/2012  . Breast cancer, left breast 09/12/2011    Past Medical History:  Past Medical History  Diagnosis Date  . GERD (gastroesophageal reflux disease)   . Glaucoma   . Cancer   . Hypertension   . Depression    Past Surgical History:  Past Surgical History  Procedure Laterality Date  . Total knee arthroplasty Bilateral   . Total hip arthroplasty Bilateral   . Mastectomy  1996    left breast lumpectomy   . Incision and drainage hip  03/31/2012    Procedure: IRRIGATION AND DEBRIDEMENT HIP WITH POLY EXCHANGE;  Surgeon: Raymon Mutton, MD;  Location: [facility] OR;  Service: Orthopedics;  Laterality: Left;    Assessment & Plan Clinical Impression: Patient is a 79 y.o. year old female right-handed female with history of ASD and TIA maintained on chronic Coumadin as well as right total hip arthroplasty years ago as well as revision total knee in the last couple years. Patient lives alone and was independent prior to admission. Admitted 11/26/2012 after a fall sustaining a small spiral fracture at the tip of the total hip stem. INR upon admission 1.94. Patient underwent ORIF of periprosthetic femur fracture 12/01/2012 per Dr. [name]. Patient is weightbearing as tolerated with posterior hip precautions. Chronic Coumadin resumed  with subcutaneous Lovenox for INR greater than 2.00. Postoperative pain control. Contact precautions for MRSA nasal nares.   Patient transferred to CIR on 12/02/2012 .    Patient currently requires max to total  with basic self-care skills and basic mobility secondary to muscle weakness and acute pain in hip, decreased cardiorespiratoy endurance and decreased standing balance, decreased postural control, decreased balance strategies and difficulty maintaining precautions.  Prior to hospitalization, patient could complete ADL with independent .  Patient will benefit from skilled intervention to decrease level of assist with basic self-care skills and increase independence with basic self-care skills prior to discharge home with care partner.  Anticipate patient will require intermittent supervision and follow up home health.  OT - End of Session Activity Tolerance: Tolerates < 10 min activity, no significant change in vital signs Endurance Deficit: Yes OT Assessment Rehab Potential: Fair Barriers to Discharge: Decreased caregiver support OT Plan OT Intensity: Minimum of 1-2 x/day, 45 to 90 minutes OT Frequency: 5 out of 7 days OT Duration/Estimated Length of Stay: 1-2 weeks OT Treatment/Interventions: Balance/vestibular training;Cognitive remediation/compensation;Discharge planning;Community reintegration;DME/adaptive equipment instruction;Functional mobility training;Pain management;Patient/family education;Self Care/advanced ADL retraining;Therapeutic Activities;UE/LE Strength taining/ROM;UE/LE Coordination activities;Therapeutic Exercise OT Recommendation Patient destination: Home Follow Up Recommendations: Home health OT   Skilled Therapeutic Intervention   OT Evaluation Precautions/Restrictions  Precautions Precautions: Posterior Hip;Fall Precaution Comments: Bilateral Posterior Hip precautions Restrictions Weight Bearing Restrictions: No RLE Weight Bearing: Weight bearing as  tolerated General Chart Reviewed: Yes Amount of Missed OT Time (min): 30 Minutes Missed Time Reason: Pain Family/Caregiver Present: No Vital Signs  Pain Pain Assessment Pain Score: 10-Worst pain ever Pain Type: Surgical pain Pain Location: Hip Pain Onset: With Activity Pain Intervention(s): RN made aware;Repositioned Home Living/Prior Functioning Home Living Lives With: Alone Available Help at Discharge: Family;Available 24 hours/day Type of Home: House Home Access: Stairs to enter [employer] of Steps: 2 Entrance Stairs-Rails: None Home Layout: One level Bathroom Shower/Tub: Walk-in shower;Door Home Adaptive Equipment: Walker - rolling;Straight cane Prior Function Level of Independence: Independent with basic ADLs;Independent with transfers;Independent with gait Able to Take Stairs?: Yes Driving: Yes Vocation: Retired ADL   Vision/Perception  Vision - History Baseline Vision: Wears glasses all the time Patient Visual Report: No change from baseline Vision - Assessment Eye Alignment: Within Functional Limits Perception Perception: Within Functional Limits Praxis Praxis: Intact  Cognition Overall Cognitive Status: Within Functional Limits for tasks assessed Arousal/Alertness: Awake/alert Orientation Level: Oriented X4 Sensation Sensation Light Touch: Appears Intact Hot/Cold: Appears Intact Proprioception: Appears Intact Coordination Gross Motor Movements are Fluid and Coordinated: Yes Fine Motor Movements are Fluid and Coordinated: Yes Motor  Motor Motor - Skilled Clinical Observations: generalized weakness Mobility  Bed Mobility Supine to Sit: 3: Mod assist;HOB flat Supine to Sit Details (indicate cue type and reason): Pt requires increased time and encouragement due to pain and anxiety, assist for L LE out of bed and lifting assist at trunk Sit to Supine: 2: Max assist  Trunk/Postural Assessment  Cervical Assessment Cervical Assessment:  Within Functional Limits Thoracic Assessment Thoracic Assessment:  (mild kyphosis) Lumbar Assessment Lumbar Assessment:  (limited by pain) Postural Control Postural Control: Within Functional Limits  Balance Static Sitting Balance Static Sitting - Level of Assistance: 7: Independent Static Standing Balance Static Standing - Balance Support: During functional activity Static Standing - Level of Assistance: 5: Stand by assistance Dynamic Standing Balance Dynamic Standing - Balance Support: During functional activity Dynamic Standing - Level of Assistance: 3: Mod assist Extremity/Trunk Assessment RUE Assessment RUE Assessment: Within Functional Limits LUE Assessment LUE Assessment: Within Functional Limits  FIM:  FIM - Bathing Bathing: 1: Total-Patient completes 0-2 of 10 parts or less than 25% FIM - Upper Body Dressing/Undressing Upper body dressing/undressing: 0: Wears gown/pajamas-no public clothing FIM - Lower Body Dressing/Undressing Lower body dressing/undressing: 0: Wears Oceanographer FIM - Toileting Toileting: 0: No continent bowel/bladder events this shift FIM - Bed/Chair Transfer Bed/Chair Transfer: 3: Sit > Supine: Mod A (lifting assist/Pt. 50-74%/lift 2 legs);3: Chair or W/C > Bed: Mod A (lift or lower assist);3: Bed > Chair or W/C: Mod A (lift or lower assist)   Refer to Care Plan for Long Term Goals  Recommendations for other services: None  Discharge Criteria: Patient will be discharged from OT if patient refuses treatment 3 consecutive times without medical reason, if treatment goals not met, if there is a change in medical status, if patient makes no progress towards goals or if patient is discharged from hospital.  The above assessment, treatment plan, treatment alternatives and goals were discussed and mutually agreed upon: by patient  1:1 OT eval initiated but limited due to pt's pain. OT goals, purpose and role discussed. Pt with  increased pain and declined (politely) but agreed to help to get back in bed. Pt reported she was in a lot of pain today. Perform scoot pivot back to bed with max A, requiring multiple squats to get over to bed. Max to total A to return to supine. Pt missed 30 min due to pain.   Roney Mans [facility] 12/03/2012, 10:48 AM

## 2012-12-03 NOTE — Progress Notes (Signed)
ANTICOAGULATION CONSULT NOTE - Follow Up Consult  Pharmacy Consult for Coumadin Indication: History ASD, TIA  Allergies  Allergen Reactions  . Dilaudid (Hydromorphone Hcl) Shortness Of Breath  . Morphine And Related Shortness Of Breath  . Sulfa Antibiotics Other (See Comments)    "Made me drunk";  wobbly  . Keflex (Cephalexin) Nausea Only  . Levaquin (Levofloxacin) Hives and Rash    Patient Measurements: Weight: 142 lb 3.2 oz (64.5 kg) Heparin Dosing Weight:  Vital Signs:    Labs:  Recent Labs  12/01/12 0435 12/02/12 0530 12/02/12 0954 12/03/12 0548  HGB  --   --  8.1* 8.7*  HCT  --   --  24.4* 26.7*  PLT  --   --  166 200  LABPROT 15.1 16.8*  --  23.1*  INR 1.21 1.40  --  2.15*  CREATININE 0.87 0.82  --  0.83    The CrCl is unknown because both a height and weight (above a minimum accepted value) are required for this calculation.   Medications:  Scheduled:  . amLODipine  5 mg Oral Daily  . atorvastatin  10 mg Oral Daily  . buPROPion  300 mg Oral Daily  . calcium carbonate  1,250 mg Oral BID WC  . enoxaparin (LOVENOX) injection  40 mg Subcutaneous Q24H  . HYDROcodone-acetaminophen  1 tablet Oral BID  . lactose free nutrition  237 mL Oral BID BM  . latanoprost  1 drop Both Eyes QHS  . sertraline  150 mg Oral Daily  . warfarin  5 mg Oral ONCE-1800  . Warfarin - Pharmacist Dosing Inpatient   Does not apply q1800    Assessment: 79yo female with history of ASD, TIA with Coumadin now resumed s/p repair of femur fracture.  INR 2.15 this after receiving 3mg  Coumadin prior to transfer to rehab on 5/27.  Home dose is 3mg  alternating with 2mg  every other day.  Hg improved to 8.7 and pltc wnl this AM.  No bleeding problems noted.  Goal of Therapy:  INR 2-3 Monitor platelets by anticoagulation protocol: Yes   Plan:  1.  D/C Lovenox 2.  Resume home dose of Coumdin 3.  Continue daily INR  Marisue Humble, PharmD Clinical Pharmacist [facility]

## 2012-12-04 ENCOUNTER — Inpatient Hospital Stay (HOSPITAL_COMMUNITY): Payer: Medicare Other

## 2012-12-04 ENCOUNTER — Inpatient Hospital Stay (HOSPITAL_COMMUNITY): Payer: Medicare Other | Admitting: Physical Therapy

## 2012-12-04 LAB — PROTIME-INR: INR: 2.3 — ABNORMAL HIGH (ref 0.00–1.49)

## 2012-12-04 MED ORDER — ALPRAZOLAM 0.5 MG PO TABS
0.5000 mg | ORAL_TABLET | ORAL | Status: DC | PRN
  Administered 2012-12-04: 0.5 mg via ORAL
  Filled 2012-12-04: qty 1

## 2012-12-04 NOTE — Progress Notes (Signed)
ANTICOAGULATION CONSULT NOTE - Follow Up Consult  Pharmacy Consult for Coumadin Indication: History ASD, TIA  Allergies  Allergen Reactions  . Dilaudid (Hydromorphone Hcl) Shortness Of Breath  . Morphine And Related Shortness Of Breath  . Sulfa Antibiotics Other (See Comments)    "Made me drunk";  wobbly  . Keflex (Cephalexin) Nausea Only  . Levaquin (Levofloxacin) Hives and Rash    Patient Measurements: Weight: 158 lb (71.668 kg)   Vital Signs: Temp: 98.5 F (36.9 C) (05/29 0500) Temp src: Oral (05/29 0500) BP: 112/67 mmHg (05/29 0816) Pulse Rate: 86 (05/29 0500)  Labs:  Recent Labs  12/02/12 0530 12/02/12 0954 12/03/12 0548 12/04/12 0557  HGB  --  8.1* 8.7*  --   HCT  --  24.4* 26.7*  --   PLT  --  166 200  --   LABPROT 16.8*  --  23.1* 24.3*  INR 1.40  --  2.15* 2.30*  CREATININE 0.82  --  0.83  --     The CrCl is unknown because both a height and weight (above a minimum accepted value) are required for this calculation.   Medications:  Scheduled:  . amLODipine  5 mg Oral Daily  . atorvastatin  10 mg Oral Daily  . buPROPion  300 mg Oral Daily  . calcium carbonate  1,250 mg Oral BID WC  . HYDROcodone-acetaminophen  1 tablet Oral BID  . lactose free nutrition  120 mL Oral TID WC & HS  . latanoprost  1 drop Both Eyes QHS  . sertraline  150 mg Oral Daily  . warfarin  2 mg Oral Q48H  . warfarin  3 mg Oral Q48H  . Warfarin - Pharmacist Dosing Inpatient   Does not apply q1800    Assessment: 79yo female with history of ASD, TIA with Coumadin  resumed s/p repair of femur fracture.  Today the INR is 2.3, therapeutic on previous after resuming her PTA dose of  3mg  alternating with 2mg  every other day. No bleeding problems noted.  Goal of Therapy:  INR 2-3 Monitor platelets by anticoagulation protocol: Yes   Plan:   Continue home dose of Coumadin alternating 3mg  and 2 mg every other day. Today's dose due is 3mg . Continue daily INR  Noah Delaine,  RPh Clinical Pharmacist Pager: [phone]

## 2012-12-04 NOTE — Progress Notes (Signed)
Physical Therapy Session Note  Patient Details  Name: Renee Adams MRN: [id number] Date of Birth: 1934-04-04  Today's Date: 12/04/2012 Time: 1100-1130 Time Calculation (min): 30 min  Short Term Goals: Week 1:  PT Short Term Goal 1 (Week 1): Pt will perform functional transfers with min A PT Short Term Goal 2 (Week 1): Pt will gait in controlled environment with min A 50'     Skilled Therapeutic Interventions/Progress Updates:  Pt received supine in bed, HOB elevated.    Treatment focused on bed mobility, transfer and therapeutic exercise performed with bil LEs to increase strength for functional mobility: R quad sets, R hip abduction, R heel slides, bil ankle pumps.  Bed> recliner transfer using RW, extremely slowly due to R hip pain; mod cues for foot placement, mod assist for balance as pt leaned backwards and to L to avoid wt on RLE.  Pt positioned with LEs elevated in recliner; all needs within reach.    Therapy Documentation Precautions:  Precautions Precautions: Posterior Hip;Fall Precaution Comments: Bilateral Posterior Hip precautions Restrictions Weight Bearing Restrictions: No RLE Weight Bearing: Weight bearing as tolerated   Pain: Pain Assessment Pain Assessment: 0-10 Pain Score:   2; pt increased to 6/10 during transfer Pain Type: Acute pain Pain Location: Hip Pain Orientation: Right Pain Descriptors / Indicators: Constant Pain Frequency: Constant Pain Onset: On-going Patients Stated Pain Goal: 2 Pain Intervention(s): Medication (See eMAR) Multiple Pain Sites: No     See FIM for current functional status  Therapy/Group: Individual Therapy  Keishawn Rajewski 12/04/2012, 12:29 PM

## 2012-12-04 NOTE — Progress Notes (Signed)
Physical Therapy Session Note  Patient Details  Name: Renee Adams MRN: [id number] Date of Birth: Oct 23, 1933  Today's Date: 12/04/2012 Time: 5784-6962 Time Calculation (min): 55 min  Short Term Goals: Week 1:  PT Short Term Goal 1 (Week 1): Pt will perform functional transfers with min A PT Short Term Goal 2 (Week 1): Pt will gait in controlled environment with min A 50'  Skilled Therapeutic Interventions/Progress Updates:    Reviewed posterior hip precautions. Pt reports she is in too much pain to try to get up initially. Created pt a handout packet with LE exercises: quad/glute squeezes, heel slides, hamstring isometric holds, straight leg raises, and hip abduction/adduction (avoiding crossing midline), long arc quads sitting EOB. Pt performed x 10 each, some movements active assisted due to pain. Pt then willing to try stand pivot to recliner however with attempt pt unable to complete, she began "sinking" with weak LEs despite PT providing moderate then max assist. Pt returned to bed with min/mod assist.   Therapy Documentation Precautions:  Precautions Precautions: Posterior Hip;Fall Precaution Comments: Bilateral Posterior Hip precautions Restrictions Weight Bearing Restrictions: No RLE Weight Bearing: Weight bearing as tolerated Pain: Pain Assessment Pain Assessment: 0-10 Pain Score:   4 Pain Type: Acute pain Pain Location: Leg Pain Orientation: Right Pain Descriptors / Indicators: Constant Pain Frequency: Constant Pain Onset: On-going Patients Stated Pain Goal: 2 Pain Intervention(s): had just been medicated Multiple Pain Sites: No  See FIM for current functional status  Therapy/Group: Individual Therapy  Wilhemina Bonito 12/04/2012, 12:23 PM

## 2012-12-04 NOTE — Progress Notes (Addendum)
Occupational Therapy Session Note  Patient Details  Name: Renee Adams MRN: [id number] Date of Birth: 05-02-1934  Today's Date: 12/04/2012  Session 1 Time: 0800-0849 Time Calculation (min): 49 min  Short Term Goals: Week 1:  OT Short Term Goal 1 (Week 1): Transfer to BSC/ commode with mod A  OT Short Term Goal 2 (Week 1): Perform sit to stand with mod A OT Short Term Goal 3 (Week 1): Perform standing balance with mod A OT Short Term Goal 4 (Week 1): Perform clothing management with mod A for dressing and toileting  Skilled Therapeutic Interventions/Progress Updates:    Pt in bed upon arrival finishing breakfast.  Pt stated that she had requested pain medication.  After administration of meds patient requested use of BSC.  Pt was able to sit EOB with min A only and perform stand pivot transfer with mod A.  Pt c/o 10/10 pain during movement and began crying and exhibited increased anxiety throughout transfer and while seated on BSC.  After use of BSC patient stated that her pain was too much and she couldn't get dressed.  Pt assisted back to bed requiring mod A for sit->supine.  Pt requested additional pain medications if possible.  RN notified.  Pt missed 11 mins therapy secondary to increased pain.  Therapy Documentation Precautions:  Precautions Precautions: Posterior Hip;Fall Precaution Comments: Bilateral Posterior Hip precautions Restrictions Weight Bearing Restrictions: No RLE Weight Bearing: Weight bearing as tolerated General: General Amount of Missed OT Time (min): 11 Minutes VPain: Pain Assessment Pain Assessment: 0-10 Pain Score: 10-Worst pain ever Pain Type: Acute pain;Surgical pain Pain Location: Hip Pain Orientation: Right Pain Descriptors / Indicators: Aching;Constant Pain Onset: On-going Patients Stated Pain Goal: 2 Pain Intervention(s): RN made aware;Repositioned  See FIM for current functional status  Therapy/Group: Individual Therapy  Session  2 Time: 4098-1191 Pt c/o 2/10 pain seated and 4/10 pain with movement and transfers; RN aware and repositioned Individual Therapy  Pt engaged in sit<>stand activities, BSC transfers, and toileting.  Pt required mod A for sit<>stand from recliner and min A for sit<>stand from [facility].  Pt required extra time to complete all stand pivot transfers and exhibits increased anxiety with any transitional movements.  Pt required tot A + 2 for toileting.  Pt returned to recliner with call bell within reach.  Renee Adams [facility] 12/04/2012, 9:05 AM

## 2012-12-04 NOTE — Progress Notes (Signed)
Subjective/Complaints: Right hip pain a major issue still.  A 12 point review of systems has been performed and if not noted above is otherwise negative.   Objective: Vital Signs: Blood pressure 112/67, pulse 86, temperature 98.5 F (36.9 C), temperature source Oral, resp. rate 19, weight 64.5 kg (142 lb 3.2 oz), SpO2 95.00%. No results found.  Recent Labs  12/02/12 0954 12/03/12 0548  WBC 5.7 5.4  HGB 8.1* 8.7*  HCT 24.4* 26.7*  PLT 166 200    Recent Labs  12/02/12 0530 12/03/12 0548  NA 142 142  K 4.4 4.2  CL 110 105  GLUCOSE 87 78  BUN 19 15  CREATININE 0.82 0.83  CALCIUM 8.5 9.0   CBG (last 3)  No results found for this basename: GLUCAP,  in the last 72 hours  Wt Readings from Last 3 Encounters:  12/02/12 64.5 kg (142 lb 3.2 oz)  11/28/12 63.504 kg (140 lb)  11/28/12 63.504 kg (140 lb)    Physical Exam:  Constitutional: She is oriented to person, place, and time. She appears well-developed and well-nourished.  HENT:  Head: Normocephalic and atraumatic.  Right Ear: External ear normal.  Left Ear: External ear normal.  Eyes: Conjunctivae and EOM are normal. Pupils are equal, round, and reactive to light. Right eye exhibits no discharge. Left eye exhibits no discharge. No scleral icterus.  Neck: Normal range of motion. Neck supple. No JVD present. No tracheal deviation present. No thyromegaly present.  Cardiovascular: Normal rate and regular rhythm. Exam reveals no friction rub.  No murmur heard.  Pulmonary/Chest: Effort normal and breath sounds normal. No respiratory distress. She has no wheezes.  Abdominal: Soft. Bowel sounds are normal. She exhibits no distension and no mass. There is no tenderness. There is no rebound and no guarding.  Musculoskeletal:    Joint deformities in the hands.  Lymphadenopathy:  She has no cervical adenopathy.  Neurological: She is alert and oriented to person, place, and time. UE is 4/5. RLE is 2-3/5 (pain) proximally to 4-/5  distally. LLE is 4/5 proximally to distally.  Skin: No rash noted. No erythema.  Surgical site clean and dry with staples. Swelling noted with bruising around incision site. Right thigh is tender. Psychiatric: She has a normal mood and affect. Her behavior is normal. Judgment and thought content normal    Assessment/Plan: 1. Functional deficits secondary to right peri-prosthetic femoral shaft fx which require 3+ hours per day of interdisciplinary therapy in a comprehensive inpatient rehab setting. Physiatrist is providing close team supervision and 24 hour management of active medical problems listed below. Physiatrist and rehab team continue to assess barriers to discharge/monitor patient progress toward functional and medical goals. FIM: FIM - Bathing Bathing: 1: Total-Patient completes 0-2 of 10 parts or less than 25%  FIM - Upper Body Dressing/Undressing Upper body dressing/undressing steps patient completed: Thread/unthread right sleeve of front closure shirt/dress;Thread/unthread left sleeve of front closure shirt/dress;Pull shirt around back of front closure shirt/dress;Button/unbutton shirt;Thread/unthread left bra strap;Thread/unthread right bra strap Upper body dressing/undressing: 4: Min-Patient completed 75 plus % of tasks (increased time) FIM - Lower Body Dressing/Undressing Lower body dressing/undressing: 0: Wears gown/pajamas-no public clothing  FIM - Toileting Toileting: 0: No continent bowel/bladder events this shift     FIM - Bed/Chair Transfer Bed/Chair Transfer: 3: Sit > Supine: Mod A (lifting assist/Pt. 50-74%/lift 2 legs);3: Chair or W/C > Bed: Mod A (lift or lower assist);3: Bed > Chair or W/C: Mod A (lift or lower assist)  FIM -  Locomotion: Wheelchair Distance: 40 Locomotion: Wheelchair: 1: Travels less than 50 ft with supervision, cueing or coaxing FIM - Locomotion: Ambulation Ambulation/Gait Assistance: 3: Mod assist Locomotion: Ambulation: 1: Travels less  than 50 ft with minimal assistance (Pt.>75%)  Comprehension Comprehension Mode: Auditory Comprehension: 5-Follows basic conversation/direction: With no assist  Expression Expression Mode: Verbal Expression: 5-Expresses complex 90% of the time/cues < 10% of the time  Social Interaction Social Interaction: 6-Interacts appropriately with others with medication or extra time (anti-anxiety, antidepressant).  Problem Solving Problem Solving: 5-Solves basic problems: With no assist  Memory Memory: 5-Recognizes or recalls 90% of the time/requires cueing < 10% of the time  Medical Problem List and Plan:  1. Right periprosthetic femoral shaft fracture. Status post ORIF 12/01/2012.  2. DVT Prophylaxis/Anticoagulation: Chronic Coumadin therapy. INR 1.40. Subcutaneous Lovenox for INR greater than 2.00  3. Pain Management: Hydrocodone and Robaxin as needed.  -scheduled hydrocodone before am and pm therapy  -would like to avoid long acting narc if possible  -utilize local remedies for pain also 4. Mood: Xanax 1 mg every 4 hours as needed. Wellbutrin 300 mg daily, Zoloft 150 mg daily. Provide emotional support  5. Neuropsych: This patient is capable of making decisions on her own behalf.  6. Hypertension. Norvasc 5 mg daily. Monitor for the effects of increased mobility and pain upon her BP.  7. Hyperlipidemia. Lipitor  8. 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 Urinary incontinence.   -is seen by urology as an outpt-- UDS were planned  -check PVR's!  -ua neg, culture pending  LOS (Days) 2 A FACE TO FACE EVALUATION WAS PERFORMED  Asusena Sigley T 12/04/2012 8:42 AM

## 2012-12-05 ENCOUNTER — Inpatient Hospital Stay (HOSPITAL_COMMUNITY): Payer: Medicare Other

## 2012-12-05 ENCOUNTER — Inpatient Hospital Stay (HOSPITAL_COMMUNITY): Payer: Medicare Other | Admitting: Physical Therapy

## 2012-12-05 ENCOUNTER — Encounter (HOSPITAL_COMMUNITY): Payer: Self-pay | Admitting: Orthopedic Surgery

## 2012-12-05 ENCOUNTER — Inpatient Hospital Stay (HOSPITAL_COMMUNITY): Payer: Medicare Other | Admitting: Occupational Therapy

## 2012-12-05 DIAGNOSIS — I10 Essential (primary) hypertension: Secondary | ICD-10-CM

## 2012-12-05 DIAGNOSIS — I672 Cerebral atherosclerosis: Secondary | ICD-10-CM | POA: Diagnosis not present

## 2012-12-05 DIAGNOSIS — W19XXXA Unspecified fall, initial encounter: Secondary | ICD-10-CM

## 2012-12-05 DIAGNOSIS — F341 Dysthymic disorder: Secondary | ICD-10-CM

## 2012-12-05 DIAGNOSIS — S72309A Unspecified fracture of shaft of unspecified femur, initial encounter for closed fracture: Secondary | ICD-10-CM

## 2012-12-05 LAB — CBC
MCHC: 32.6 g/dL (ref 30.0–36.0)
Platelets: 282 10*3/uL (ref 150–400)
RDW: 14.9 % (ref 11.5–15.5)
WBC: 7.2 10*3/uL (ref 4.0–10.5)

## 2012-12-05 LAB — URINE CULTURE

## 2012-12-05 LAB — BASIC METABOLIC PANEL
BUN: 16 mg/dL (ref 6–23)
GFR calc Af Amer: 81 mL/min — ABNORMAL LOW (ref >90)
GFR calc non Af Amer: 70 mL/min — ABNORMAL LOW (ref >90)
Potassium: 3.7 mEq/L (ref 3.5–5.1)
Sodium: 142 mEq/L (ref 135–145)

## 2012-12-05 LAB — PROTIME-INR
INR: 2.44 — ABNORMAL HIGH (ref 0.00–1.49)
Prothrombin Time: 25.4 seconds — ABNORMAL HIGH (ref 11.6–15.2)

## 2012-12-05 MED ORDER — ALPRAZOLAM 0.5 MG PO TABS
0.5000 mg | ORAL_TABLET | ORAL | Status: DC | PRN
  Administered 2012-12-05: 0.5 mg via ORAL
  Filled 2012-12-05: qty 1

## 2012-12-05 MED ORDER — ALPRAZOLAM 0.5 MG PO TABS
1.0000 mg | ORAL_TABLET | Freq: Every day | ORAL | Status: DC
  Administered 2012-12-05 – 2012-12-12 (×5): 1 mg via ORAL
  Filled 2012-12-05 (×5): qty 2

## 2012-12-05 NOTE — Progress Notes (Signed)
Subjective/Complaints: Right hip pain better with schedule medicine but still a factor. Xanax helped anxiety. A 12 point review of systems has been performed and if not noted above is otherwise negative.   Objective: Vital Signs: Blood pressure 104/64, pulse 95, temperature 98.3 F (36.8 C), temperature source Oral, resp. rate 20, weight 71.668 kg (158 lb), SpO2 95.00%. No results found.  Recent Labs  12/02/12 0954 12/03/12 0548  WBC 5.7 5.4  HGB 8.1* 8.7*  HCT 24.4* 26.7*  PLT 166 200    Recent Labs  12/03/12 0548  NA 142  K 4.2  CL 105  GLUCOSE 78  BUN 15  CREATININE 0.83  CALCIUM 9.0   CBG (last 3)  No results found for this basename: GLUCAP,  in the last 72 hours  Wt Readings from Last 3 Encounters:  12/04/12 71.668 kg (158 lb)  11/28/12 63.504 kg (140 lb)  11/28/12 63.504 kg (140 lb)    Physical Exam:  Constitutional: She is oriented to person, place, and time. She appears well-developed and well-nourished.  HENT:  Head: Normocephalic and atraumatic.  Right Ear: External ear normal.  Left Ear: External ear normal.  Eyes: Conjunctivae and EOM are normal. Pupils are equal, round, and reactive to light. Right eye exhibits no discharge. Left eye exhibits no discharge. No scleral icterus.  Neck: Normal range of motion. Neck supple. No JVD present. No tracheal deviation present. No thyromegaly present.  Cardiovascular: Normal rate and regular rhythm. Exam reveals no friction rub.  No murmur heard.  Pulmonary/Chest: Effort normal and breath sounds normal. No respiratory distress. She has no wheezes.  Abdominal: Soft. Bowel sounds are normal. She exhibits no distension and no mass. There is no tenderness. There is no rebound and no guarding.  Musculoskeletal:    Joint deformities in the hands.  Lymphadenopathy:  She has no cervical adenopathy.  Neurological: She is alert and oriented to person, place, and time. UE is 4/5. RLE is 2-3/5 (pain) proximally to 4-/5  distally. LLE is 4/5 proximally to distally.  Skin: No rash noted. No erythema.  Surgical site clean and dry with staples. Swelling noted with bruising around incision site. Right thigh is tender. Psychiatric: She has a normal mood and affect. Her behavior is normal. Judgment and thought content normal    Assessment/Plan: 1. Functional deficits secondary to right peri-prosthetic femoral shaft fx which require 3+ hours per day of interdisciplinary therapy in a comprehensive inpatient rehab setting. Physiatrist is providing close team supervision and 24 hour management of active medical problems listed below. Physiatrist and rehab team continue to assess barriers to discharge/monitor patient progress toward functional and medical goals. FIM: FIM - Bathing Bathing: 1: Total-Patient completes 0-2 of 10 parts or less than 25%  FIM - Upper Body Dressing/Undressing Upper body dressing/undressing steps patient completed: Thread/unthread right sleeve of front closure shirt/dress;Thread/unthread left sleeve of front closure shirt/dress;Pull shirt around back of front closure shirt/dress;Button/unbutton shirt;Thread/unthread left bra strap;Thread/unthread right bra strap Upper body dressing/undressing: 4: Min-Patient completed 75 plus % of tasks (increased time) FIM - Lower Body Dressing/Undressing Lower body dressing/undressing: 0: Wears gown/pajamas-no public clothing  FIM - Toileting Toileting: 1: Total-Patient completed zero steps, helper did all 3  FIM - Diplomatic Services operational officer Devices: Bedside commode Toilet Transfers: 3-To toilet/BSC: Mod A (lift or lower assist);3-From toilet/BSC: Mod A (lift or lower assist)  FIM - Bed/Chair Transfer Bed/Chair Transfer Assistive Devices: Bed rails;HOB elevated Bed/Chair Transfer: 4: Supine > Sit: Min A (steadying Pt. >  75%/lift 1 leg);3: Bed > Chair or W/C: Mod A (lift or lower assist)  FIM - Locomotion: Wheelchair Distance:  40 Locomotion: Wheelchair: 0: Activity did not occur FIM - Locomotion: Ambulation Ambulation/Gait Assistance: 3: Mod assist Locomotion: Ambulation: 0: Activity did not occur  Comprehension Comprehension Mode: Auditory Comprehension: 5-Follows basic conversation/direction: With no assist  Expression Expression Mode: Verbal Expression: 5-Expresses basic 90% of the time/requires cueing < 10% of the time.  Social Interaction Social Interaction: 6-Interacts appropriately with others with medication or extra time (anti-anxiety, antidepressant).  Problem Solving Problem Solving: 5-Solves complex 90% of the time/cues < 10% of the time  Memory Memory: 5-Recognizes or recalls 90% of the time/requires cueing < 10% of the time  Medical Problem List and Plan:  1. Right periprosthetic femoral shaft fracture. Status post ORIF 12/01/2012.  2. DVT Prophylaxis/Anticoagulation: Chronic Coumadin therapy. INR 1.40. Subcutaneous Lovenox for INR greater than 2.00  3. Pain Management: Hydrocodone and Robaxin as needed.  -scheduled hydrocodone before am and pm therapy  -would like to avoid long acting narc if possible  -utilize local remedies for pain also 4. Mood: Xanax 1 mg every 4 hours as needed. Wellbutrin 300 mg daily, Zoloft 150 mg daily. Provide emotional support  -will schedule am xanax as well  5. Neuropsych: This patient is capable of making decisions on her own behalf.  6. Hypertension. Norvasc 5 mg daily. Monitor for the effects of increased mobility and pain upon her BP.  7. Hyperlipidemia. Lipitor  8. 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 Urinary incontinence.   -is seen by urology as an outpt-- UDS was planned  -PVR [phone] so far  -ua neg, culture pending  LOS (Days) 3 A FACE TO FACE EVALUATION WAS PERFORMED  Renee Adams T 12/05/2012 8:49 AM

## 2012-12-05 NOTE — Progress Notes (Signed)
[facility] Individual Statement of Services  Patient Name:  Renee Adams  Date:  12/05/2012  Welcome to the [facility].  Our goal is to provide you with an individualized program based on your diagnosis and situation, designed to meet your specific needs.  With this comprehensive rehabilitation program, you will be expected to participate in at least 3 hours of rehabilitation therapies Monday-Friday, with modified therapy programming on the weekends.  Your rehabilitation program will include the following services:  Physical Therapy (PT), Occupational Therapy (OT), 24 hour per day rehabilitation nursing, Therapeutic Recreaction (TR), Case Management (Social Worker), Rehabilitation Medicine, Nutrition Services and Pharmacy Services  Weekly team conferences will be held on Tuesdays to discuss your progress.  Your Social Worker will talk with you frequently to get your input and to update you on team discussions.  Team conferences with you and your family in attendance may also be held.  Expected length of stay: 10-14 days  Overall anticipated outcome: modified independent  Depending on your progress and recovery, your program may change. Your Social Worker will coordinate services and will keep you informed of any changes. Your Social Worker's name and contact numbers are listed  below.  The following services may also be recommended but are not provided by the [facility]:   Driving Evaluations  Home Health Rehabiltiation Services  Outpatient Rehabilitatation Servives    Arrangements will be made to provide these services after discharge if needed.  Arrangements include referral to agencies that provide these services.  Your insurance has been verified to be:  Medicare Your primary doctor is:  Dr. Clovis Riley  Pertinent information will be shared with your doctor and your insurance company.  Social Worker:  [place], [place]  [phone] or ([phone]  Information discussed with and copy given to patient by: Amada Jupiter, 12/05/2012, 3:58 PM

## 2012-12-05 NOTE — Progress Notes (Signed)
Occupational Therapy Session Note  Patient Details  Name: Renee Adams MRN: [id number] Date of Birth: Sep 02, 1933  Today's Date: 12/05/2012 Time: 0805-0900 Time Calculation (min): 55 min  Short Term Goals: Week 1:  OT Short Term Goal 1 (Week 1): Transfer to BSC/ commode with mod A  OT Short Term Goal 2 (Week 1): Perform sit to stand with mod A OT Short Term Goal 3 (Week 1): Perform standing balance with mod A OT Short Term Goal 4 (Week 1): Perform clothing management with mod A for dressing and toileting  Skilled Therapeutic Interventions/Progress Updates:    Pt resting in bed upon arrival and greeted therapist appropriately and stated that she was looking for her pants.  Pt sat EOB with supervision to don pants using reacher to thread BLE into pants.  Pt required assistance to thread pants and pull them up over hips.  I asked patient if she needed a shirt and she stated she already had a shirt on.  When I told patient that all she had on was a hospital gown she commented that she thought she had already put her shirt on.  Pt donned shirt while seated EOB.  Pt performed stand pivot transfer with RW from EOB->w/c with min A and extra time.  Pt is hesitant to place weight through RLE and performs transfer with hops and toe touch only.  Pt rolled to sink and completed grooming tasks with supervision.  Pt returned to bedside in w/c with quick release belt and call bell in place.  RN notified.  Therapy Documentation Precautions:  Precautions Precautions: Posterior Hip;Fall Precaution Comments: Bilateral Posterior Hip precautions Restrictions Weight Bearing Restrictions: No RLE Weight Bearing: Weight bearing as tolerated Pain: Pain Assessment Pain Assessment: 0-10 Pain Score:   3 Faces Pain Scale: No hurt Pain Type: Acute pain;Surgical pain Pain Location: Hip Pain Orientation: Right Pain Descriptors / Indicators: Aching;Constant Pain Frequency: Constant Pain Onset: On-going Patients  Stated Pain Goal: 1 Pain Intervention(s): RN made aware;Repositioned Multiple Pain Sites: No   Other Treatments:    See FIM for current functional status  Therapy/Group: Individual Therapy  Rich Brave 12/05/2012, 9:06 AM

## 2012-12-05 NOTE — Progress Notes (Signed)
Patient having increased confusion.  Patient speech slightly slurred and patient continues to have visual hallucinations.  Patient trying to get out of bed and is unable to reorient.  Deatra Ina, PA aware.  New orders received.  Will continue to monitor.

## 2012-12-05 NOTE — Progress Notes (Signed)
Per Renee Adams, OT and [place], [place], patient has been experiencing visual and auditory hallucinations.  Patient currently alert and able to state name, location, time, and situation properly.  Renee Ina, PA notified of visual hallucinations and patient's complaints of pain in right hip.  Advised to give tylenol only due to the hallucinations.  Will continue to monitor.

## 2012-12-05 NOTE — Progress Notes (Signed)
Social Work  Social Work Assessment and Plan  Patient Details  Name: Renee Adams MRN: [id number] Date of Birth: [birth year]  Today's Date: 12/05/2012  Problem List:  Patient Active Problem List   Diagnosis Date Noted  . Fracture of femoral neck, right 12/03/2012  . Fall 11/26/2012  . Fracture of femoral shaft, right, closed 11/26/2012  . UTI (urinary tract infection) 11/26/2012  . Essential hypertension, benign 08/18/2012  . Status post left hip replacement 04/18/2012  . Osteomyelitis of left hip 03/31/2012  . Breast cancer, left breast 09/12/2011   Past Medical History:  Past Medical History  Diagnosis Date  . GERD (gastroesophageal reflux disease)   . Glaucoma   . Cancer   . Hypertension   . Depression    Past Surgical History:  Past Surgical History  Procedure Laterality Date  . Total knee arthroplasty Bilateral   . Total hip arthroplasty Bilateral   . Mastectomy  1996    left breast lumpectomy   . Incision and drainage hip  03/31/2012    Procedure: IRRIGATION AND DEBRIDEMENT HIP WITH POLY EXCHANGE;  Surgeon: Raymon Mutton, MD;  Location: [facility] OR;  Service: Orthopedics;  Laterality: Left;  . Orif femur fracture Right 11/30/2012    Procedure: OPEN REDUCTION INTERNAL FIXATION (ORIF) DISTAL FEMUR FRACTURE;  Surgeon: Dannielle Huh, MD;  Location: [facility] OR;  Service: Orthopedics;  Laterality: Right;   Social History:  reports that she has never smoked. She has never used smokeless tobacco. She reports that she does not drink alcohol or use illicit drugs.  Family / Support Systems Marital Status: Widow/Widower How Long?: 1997 Patient Roles:  (Widowed) Children: son, Phil Corti @ [phone] or (C) [phone] ([place], [place]) and daughter, Julianne Rice @ [phone]. [place], [place]) Anticipated Caregiver: Goal is mod independent. Daughter to come stay with pt after d.c as needed. Ability/Limitations of Caregiver: none Caregiver Availability: 24/7 (for 1-2 weeks if needed by  daughter) Family Dynamics: son and daughter very supportive and planning to take time from work to assist as needed.  Social History Preferred language: English Religion: Baptist Cultural Background: NA Education: HS Read: Yes Write: Yes Employment Status: Retired Fish farm manager Issues: none Guardian/Conservator: none   Abuse/Neglect Physical Abuse: Denies Verbal Abuse: Denies Sexual Abuse: Denies Exploitation of patient/patient's resources: Denies Self-Neglect: Denies  Emotional Status Pt's affect, behavior adn adjustment status: Pt very pleasant, talkative and motivated for CIR.  Denies any significant emotional distress - will monitor throughout stay. Recent Psychosocial Issues: None Pyschiatric History: none Substance Abuse History: none  Patient / Family Perceptions, Expectations & Goals Pt/Family understanding of illness & functional limitations: Pt and family with basic understanding of injury, surgery and precautions to be followed. Premorbid pt/family roles/activities: Pt completely indpendent PTA and "doing everything" per son. Anticipated changes in roles/activities/participation: Little change anticipated if reaching mod i goals. Pt/family expectations/goals: mod i goals  [employer]: None Premorbid Home Care/DME Agencies: None Transportation available at discharge: yes  Discharge Planning Living Arrangements: Alone Support Systems: Children Type of Residence: Private residence Insurance Resources: Electrical engineer Resources: Restaurant manager, fast food Screen Referred: No Living Expenses: Own Money Management: Patient Do you have any problems obtaining your medications?: No Home Management: patient Patient/Family Preliminary Plans: home with assist from daughter initially Social Work Anticipated Follow Up Needs: HH/OP Expected length of stay: 10-12 days  Clinical Impression Pleasant woman here after fall with  fx.  Good family support and goals of mod independent.  No emotional  distress noted - will monitor through stay.  Natashia Roseman 12/05/2012, 4:50 PM

## 2012-12-05 NOTE — Progress Notes (Signed)
Physical Therapy Session Note  Patient Details  Name: [name] MRN: [id number] Date of Birth: 1934/06/21  Today's Date: 12/05/2012 Time: 3086-5784 Time Calculation (min): 56 min  Short Term Goals: Week 1:  PT Short Term Goal 1 (Week 1): Pt will perform functional transfers with min A PT Short Term Goal 2 (Week 1): Pt will gait in controlled environment with min A 50'  Skilled Therapeutic Interventions/Progress Updates:    Confusion this session. Hallucinations of a spider on therapists shoulder, thought other pts in gym were getting their hair washed, thought a therapist had followed her in a wheelchair this morning then disappeared, randomly answered questions in response to someone she thought she heard. RN and PA made aware.  Wheelchair mobility with supervision x 60' with cues for direction and continuation of task. Reaching task with one UE support, practicing balance and improving Rt. LE weight bearing tolerance. Pt had to return to room to go to the bathroom, min assist bathroom ambulation (4',4') with RW (however pt tearful from Rt. LE pain), mod assist from toilet with use of grab bars, cues for precautions however pt generally does well maintaining these. Max verbal cues for problem solving in bathroom in addition to simple tasks such as noticing Lt. Hand still on wall bar and moving hand back to RW etc. Max cues for brakes on wheelchair with each sit > stand.    Therapy Documentation Precautions:  Precautions Precautions: Posterior Hip;Fall Precaution Comments: Bilateral Posterior Hip precautions Restrictions Weight Bearing Restrictions: No RLE Weight Bearing: Weight bearing as tolerated Vital Signs: Therapy Vitals BP: 98/67 mmHg Patient Position, if appropriate: Sitting Pain: 4-5/10, RN called about pain however discussed current confusion as limiting factor for more pain medication.  See FIM for current functional status  Therapy/Group: Individual  Therapy  Wilhemina Bonito 12/05/2012, 11:49 AM

## 2012-12-05 NOTE — Progress Notes (Signed)
ANTICOAGULATION CONSULT NOTE - Follow Up Consult  Pharmacy Consult for Coumadin Indication: History ASD, TIA  Allergies  Allergen Reactions  . Dilaudid (Hydromorphone Hcl) Shortness Of Breath  . Morphine And Related Shortness Of Breath  . Sulfa Antibiotics Other (See Comments)    "Made me drunk";  wobbly  . Keflex (Cephalexin) Nausea Only  . Levaquin (Levofloxacin) Hives and Rash    Patient Measurements: Weight: 158 lb (71.668 kg)   Vital Signs: Temp: 98.3 F (36.8 C) (05/30 0500) Temp src: Oral (05/30 0500) BP: 98/67 mmHg (05/30 1000) Pulse Rate: 95 (05/30 0500)  Labs:  Recent Labs  12/03/12 0548 12/04/12 0557 12/05/12 0700  HGB 8.7*  --   --   HCT 26.7*  --   --   PLT 200  --   --   LABPROT 23.1* 24.3* 25.4*  INR 2.15* 2.30* 2.44*  CREATININE 0.83  --   --     The CrCl is unknown because both a height and weight (above a minimum accepted value) are required for this calculation.   Medications:  Scheduled:  . ALPRAZolam  1 mg Oral QAC breakfast  . amLODipine  5 mg Oral Daily  . atorvastatin  10 mg Oral Daily  . buPROPion  300 mg Oral Daily  . calcium carbonate  1,250 mg Oral BID WC  . HYDROcodone-acetaminophen  1 tablet Oral BID  . lactose free nutrition  120 mL Oral TID WC & HS  . latanoprost  1 drop Both Eyes QHS  . sertraline  150 mg Oral Daily  . warfarin  2 mg Oral Q48H  . warfarin  3 mg Oral Q48H  . Warfarin - Pharmacist Dosing Inpatient   Does not apply q1800    Assessment: 79yo female with history of ASD, TIA with Coumadin  resumed s/p repair of femur fracture.  Today the INR is 2.44, therapeutic on her PTA dose regimen of  3mg  alternating with 2mg  every other day. No bleeding  Noted.  Last CBC on 5/28 was stable with H/H 8.7/26.7 and PLTC 200K.  Goal of Therapy:  INR 2-3 Monitor platelets by anticoagulation protocol: Yes   Plan:  Continue home dose of Coumadin alternating 3mg  and 2 mg every other day. Today's dose due is 2mg . Continue  daily INR.  Noah Delaine, RPh Clinical Pharmacist Pager: [phone]

## 2012-12-05 NOTE — Progress Notes (Signed)
Spoke with Hosie Poisson, patient's son, regarding the new order for the CT and lab work.  Patient's son is at the bedside.  Deatra Ina, PA discussed the results of the CT with the son.  Will continue to monitor.

## 2012-12-05 NOTE — Progress Notes (Signed)
Occupational Therapy Session Note  Patient Details  Name: Renee Adams MRN: [id number] Date of Birth: [birth year]  Today's Date: 12/05/2012 Time: 1135-1200 Time Calculation (min): 25 min  Short Term Goals: Week 1:  OT Short Term Goal 1 (Week 1): Transfer to BSC/ commode with mod A  OT Short Term Goal 2 (Week 1): Perform sit to stand with mod A OT Short Term Goal 3 (Week 1): Perform standing balance with mod A OT Short Term Goal 4 (Week 1): Perform clothing management with mod A for dressing and toileting  Skilled Therapeutic Interventions/Progress Updates:   Patient received in w/c and sitting at nursing station.  When pushing patient to her room she did not know where her room was, her room number despite cues provided.  When taken to her room, she was unsure if it was truly her room even after she read her name on the door.  Patient declined need to toilet stating that she just did that and agreed to stand at sink to brush teeth.  Once supplies were obtained, patient  declined to brush teeth.  She was given her tube of lip balm and she required min assist and min vcs to put the lid back on.  Assisted patient back to bed with stand pivot and mod assist then patient able to perform 4 lateral scoots to the right with supervision and assist with LLE for sit>supine.  Patient hallucinations while seated at sink and talking to children and wondering why they were not talking back.  Once in bed, patient quickly drifted off to sleep then began to [facility] as though talking to someone while her eyes were closed.  Therapy Documentation Precautions:  Precautions Precautions: Posterior Hip;Fall Precaution Comments: Bilateral Posterior Hip precautions Restrictions Weight Bearing Restrictions: No RLE Weight Bearing: Weight bearing as tolerated Pain: No c/o pain unless engaging in mobility/movement during functional mobility. Not rated yet very limiting with mobility. Repositioned and  rest. Therapy/Group: Individual Therapy  Wendelin Reader 12/05/2012, 12:03 PM

## 2012-12-05 NOTE — Progress Notes (Signed)
Physical Therapy Note  Patient Details  Name: CHEE DIMON MRN: [id number] Date of Birth: 11-05-1933 Today's Date: 12/05/2012  Pt missed 45 min skilled PT due to increased confusion from morning session. Pt received resting in bed. Pt demonstrated less intelligible speech and was unable to determine location or time of day. Pt having difficulty keeping eyes open.  RN and PA made aware.     Sherrine Maples Cheek 12/05/2012, 1:21 PM

## 2012-12-06 ENCOUNTER — Inpatient Hospital Stay (HOSPITAL_COMMUNITY): Payer: Medicare Other | Admitting: *Deleted

## 2012-12-06 DIAGNOSIS — S72009D Fracture of unspecified part of neck of unspecified femur, subsequent encounter for closed fracture with routine healing: Secondary | ICD-10-CM

## 2012-12-06 LAB — PROTIME-INR
INR: 2.65 — ABNORMAL HIGH (ref 0.00–1.49)
Prothrombin Time: 27 seconds — ABNORMAL HIGH (ref 11.6–15.2)

## 2012-12-06 NOTE — Progress Notes (Signed)
Occupational Therapy Note  Patient Details  Name: Renee Adams MRN: [id number] Date of Birth: 10-Feb-1934 Today's Date: 12/06/2012  1430-1525  (55 min) Pain:2/10 Right hip and knee Individual session  Pt lying in bed asleep.  Aroused pt.  Treatment focused on bed mobility, sit to stand, transfers to wc and toilet.   Pt. Transferred with mod assist and was minimal assist with bed mobility.  While pt on toilet, one of the legs dropped to the floor and pt yelled. Toilet seat laterally leaning to left.  Pt maintained balance by holding to rail.  Urine spilled on floor.  Called nursing.  Cleaned up urine.   OT assisted pt to stand and transfer to wc.  Sat at sink and brushed teeth.   Pt. Oriented to place, time, vice pres, governor, but forgot OT's name.  Left in wc with safety belt on and call bell in reach.       Humberto Seals 12/06/2012, 2:41 PM

## 2012-12-06 NOTE — Progress Notes (Signed)
ANTICOAGULATION CONSULT NOTE - Follow Up Consult  Pharmacy Consult:  Coumadin Indication: History ASD, TIA  Allergies  Allergen Reactions  . Dilaudid (Hydromorphone Hcl) Shortness Of Breath  . Morphine And Related Shortness Of Breath  . Sulfa Antibiotics Other (See Comments)    "Made me drunk";  wobbly  . Keflex (Cephalexin) Nausea Only  . Levaquin (Levofloxacin) Hives and Rash    Patient Measurements: Weight: 158 lb (71.668 kg)   Vital Signs: Temp: 97.5 F (36.4 C) (05/31 0635) Temp src: Oral (05/31 0635) BP: 103/65 mmHg (05/31 0635) Pulse Rate: 73 (05/31 0635)  Labs:  Recent Labs  12/04/12 0557 12/05/12 0700 12/05/12 1423 12/05/12 1557 12/06/12 0525  HGB  --   --  8.7*  --   --   HCT  --   --  26.7*  --   --   PLT  --   --  282  --   --   LABPROT 24.3* 25.4*  --   --  27.0*  INR 2.30* 2.44*  --   --  2.65*  CREATININE  --   --   --  0.80  --     The CrCl is unknown because both a height and weight (above a minimum accepted value) are required for this calculation.   Medications:  Scheduled:  . ALPRAZolam  1 mg Oral QAC breakfast  . amLODipine  5 mg Oral Daily  . atorvastatin  10 mg Oral Daily  . buPROPion  300 mg Oral Daily  . calcium carbonate  1,250 mg Oral BID WC  . lactose free nutrition  120 mL Oral TID WC & HS  . latanoprost  1 drop Both Eyes QHS  . sertraline  150 mg Oral Daily  . warfarin  2 mg Oral Q48H  . warfarin  3 mg Oral Q48H  . Warfarin - Pharmacist Dosing Inpatient   Does not apply q1800      Assessment: 79yo female with history of ASD and TIA resumed on Coumadin s/p repair of femur fracture.  INR remains therapeutic but is trending up; no bleeding reported.   Goal of Therapy:  INR 2-3 Monitor platelets by anticoagulation protocol: Yes    Plan:  - Continue home Coumadin: alternating 3mg  and 2 mg PO every other day - Daily PT / INR for now     Locklan Canoy D. Laney Potash, PharmD, BCPS Pager:  [phone] 12/06/2012, 10:23 AM

## 2012-12-06 NOTE — Progress Notes (Signed)
Renee Adams is a 79 y.o. female 1933/08/24 [id number]  Subjective: No new complaints. No new problems. Slept well. Feeling OK.  Objective: Vital signs in last 24 hours: Temp:  [97.5 F (36.4 C)-97.6 F (36.4 C)] 97.5 F (36.4 C) (05/31 0635) Pulse Rate:  [73-98] 73 (05/31 0635) BP: (98-108)/(62-67) 103/65 mmHg (05/31 0635) SpO2:  [94 %-96 %] 94 % (05/31 7846) Weight change:  Last BM Date: 12/04/12  Intake/Output from previous day: 05/30 0701 - 05/31 0700 In: 240 [P.O.:240] Out: 2 [Urine:1; Stool:1] Last cbgs: CBG (last 3)  No results found for this basename: GLUCAP,  in the last 72 hours   Physical Exam General: No apparent distress    HEENT: moist mucosa Lungs: Normal effort. Lungs clear to auscultation, no crackles or wheezes. Cardiovascular: Regular rate and rhythm, no edema Musculoskeletal:  No change from before Neurological: No new neurological deficits Wounds: N/A    Skin: clear Alert, cooperative   Lab Results: BMET    Component Value Date/Time   NA 142 12/05/2012 1557   NA 143 09/10/2012 1315   K 3.7 12/05/2012 1557   K 4.3 09/10/2012 1315   CL 107 12/05/2012 1557   CL 109* 09/10/2012 1315   CO2 26 12/05/2012 1557   CO2 24 09/10/2012 1315   GLUCOSE 85 12/05/2012 1557   GLUCOSE 94 09/10/2012 1315   BUN 16 12/05/2012 1557   BUN 24.2 09/10/2012 1315   CREATININE 0.80 12/05/2012 1557   CREATININE 1.0 09/10/2012 1315   CALCIUM 9.2 12/05/2012 1557   CALCIUM 9.9 09/10/2012 1315   GFRNONAA 70* 12/05/2012 1557   GFRAA 81* 12/05/2012 1557   CBC    Component Value Date/Time   WBC 7.2 12/05/2012 1423   WBC 6.2 09/10/2012 1315   RBC 3.05* 12/05/2012 1423   RBC 5.00 09/10/2012 1315   HGB 8.7* 12/05/2012 1423   HGB 13.8 09/10/2012 1315   HCT 26.7* 12/05/2012 1423   HCT 41.4 09/10/2012 1315   PLT 282 12/05/2012 1423   PLT 201 09/10/2012 1315   MCV 87.5 12/05/2012 1423   MCV 82.8 09/10/2012 1315   MCH 28.5 12/05/2012 1423   MCH 27.7 09/10/2012 1315   MCHC 32.6 12/05/2012 1423   MCHC 33.4  09/10/2012 1315   RDW 14.9 12/05/2012 1423   RDW 16.8* 09/10/2012 1315   LYMPHSABS 0.9 12/03/2012 0548   LYMPHSABS 1.7 09/10/2012 1315   MONOABS 0.4 12/03/2012 0548   MONOABS 0.4 09/10/2012 1315   EOSABS 0.4 12/03/2012 0548   EOSABS 0.2 09/10/2012 1315   BASOSABS 0.1 12/03/2012 0548   BASOSABS 0.2* 09/10/2012 1315    Studies/Results: Ct Head Wo Contrast  12/05/2012   *RADIOLOGY REPORT*  Clinical Data: Confusion  CT HEAD WITHOUT CONTRAST  Technique:  Contiguous axial images were obtained from the base of the skull through the vertex without contrast.  Comparison: CT 11/26/2008  Findings: Image quality degraded by motion.  Multiple images were repeated secondary to motion.  Generalized atrophy.  Chronic left parietal infarct unchanged. Chronic microvascular ischemia in the white matter, unchanged.  No acute infarct.  Negative for hemorrhage or mass.  Atherosclerotic disease.  No skull abnormality is detected.  IMPRESSION: Atrophy and chronic ischemic change.  No acute abnormality.   Original Report Authenticated By: Janeece Riggers, M.D.    Medications: I have reviewed the patient's current medications.  Assessment/Plan:   1. Right periprosthetic femoral shaft fracture. Status post ORIF 12/01/2012.  2. DVT Prophylaxis/Anticoagulation: Chronic Coumadin therapy. INR 1.40. Subcutaneous Lovenox  for INR greater than 2.00  3. Pain Management: Hydrocodone and Robaxin as needed.  -scheduled hydrocodone before am and pm therapy  -would like to avoid long acting narc if possible  -utilize local remedies for pain also  4. Mood: Xanax 1 mg every 4 hours as needed. Wellbutrin 300 mg daily, Zoloft 150 mg daily. Provide emotional support  -will schedule am xanax as well  5. Neuropsych: This patient is capable of making decisions on her own behalf.  6. Hypertension. Norvasc 5 mg daily. Monitor for the effects of increased mobility and pain upon her BP.  7. Hyperlipidemia. Lipitor  8. Contact precautions for MRSA positive  nasal nares  9. Urinary incontinence.  -is seen by urology as an outpt-- UDS was planned  -PVR 177-249 so far  -ua neg, culture pending 10. Anemia - will watch    Length of stay, days: 4  Sonda Primes , MD 12/06/2012, 8:39 AM

## 2012-12-07 ENCOUNTER — Inpatient Hospital Stay (HOSPITAL_COMMUNITY): Payer: Medicare Other | Admitting: *Deleted

## 2012-12-07 ENCOUNTER — Inpatient Hospital Stay (HOSPITAL_COMMUNITY): Payer: Medicare Other

## 2012-12-07 DIAGNOSIS — I10 Essential (primary) hypertension: Secondary | ICD-10-CM

## 2012-12-07 DIAGNOSIS — D649 Anemia, unspecified: Secondary | ICD-10-CM

## 2012-12-07 DIAGNOSIS — S8290XS Unspecified fracture of unspecified lower leg, sequela: Secondary | ICD-10-CM

## 2012-12-07 LAB — PROTIME-INR
INR: 2.42 — ABNORMAL HIGH (ref 0.00–1.49)
Prothrombin Time: 25.2 seconds — ABNORMAL HIGH (ref 11.6–15.2)

## 2012-12-07 NOTE — Progress Notes (Signed)
ANTICOAGULATION CONSULT NOTE - Follow Up Consult  Pharmacy Consult:  Coumadin Indication: History ASD, TIA  Allergies  Allergen Reactions  . Dilaudid (Hydromorphone Hcl) Shortness Of Breath  . Morphine And Related Shortness Of Breath  . Sulfa Antibiotics Other (See Comments)    "Made me drunk";  wobbly  . Keflex (Cephalexin) Nausea Only  . Levaquin (Levofloxacin) Hives and Rash    Patient Measurements: Weight: 158 lb (71.668 kg)   Vital Signs: Temp: 98.1 F (36.7 C) (06/01 0404) Temp src: Oral (06/01 0404) BP: 135/79 mmHg (06/01 0404) Pulse Rate: 87 (06/01 0404)  Labs:  Recent Labs  12/05/12 0700 12/05/12 1423 12/05/12 1557 12/06/12 0525 12/07/12 0510  HGB  --  8.7*  --   --   --   HCT  --  26.7*  --   --   --   PLT  --  282  --   --   --   LABPROT 25.4*  --   --  27.0* 25.2*  INR 2.44*  --   --  2.65* 2.42*  CREATININE  --   --  0.80  --   --     The CrCl is unknown because both a height and weight (above a minimum accepted value) are required for this calculation.       Assessment: 79yo female with history of ASD and TIA resumed on Coumadin s/p repair of femur fracture.  INR remains therapeutic and appears stable; no bleeding reported.   Goal of Therapy:  INR 2-3 Monitor platelets by anticoagulation protocol: Yes    Plan:  - Continue home Coumadin: alternating 3mg  and 2 mg PO every other day - Change PT / INR to MWF only      Sharise Lippy D. Laney Potash, PharmD, BCPS Pager:  [phone] 12/07/2012, 7:47 AM

## 2012-12-07 NOTE — Progress Notes (Signed)
Subjective: No new complaints. No new problems. Asleep.  Objective: Vital signs in last 24 hours: Temp:  [97.9 F (36.6 C)-98.1 F (36.7 C)] 98.1 F (36.7 C) (06/01 0404) Pulse Rate:  [74-87] 87 (06/01 0404) Resp:  [17-18] 17 (06/01 0404) BP: (78-135)/(46-79) 135/79 mmHg (06/01 0404) SpO2:  [91 %-95 %] 95 % (06/01 0404) Weight change:  Last BM Date: 12/05/12  Intake/Output from previous day: 05/31 0701 - 06/01 0700 In: 360 [P.O.:360] Out: 149 [Urine:149] Last cbgs: CBG (last 3)  No results found for this basename: GLUCAP,  in the last 72 hours   Physical Exam General: No apparent distress    HEENT: moist mucosa Lungs: Normal effort. Lungs clear to auscultation, no crackles or wheezes. Cardiovascular: Regular rate and rhythm, no edema Musculoskeletal:  No change from before Neurological: No new neurological deficits Wounds: N/A    Skin: clear Asleep   Lab Results: BMET    Component Value Date/Time   NA 142 12/05/2012 1557   NA 143 09/10/2012 1315   K 3.7 12/05/2012 1557   K 4.3 09/10/2012 1315   CL 107 12/05/2012 1557   CL 109* 09/10/2012 1315   CO2 26 12/05/2012 1557   CO2 24 09/10/2012 1315   GLUCOSE 85 12/05/2012 1557   GLUCOSE 94 09/10/2012 1315   BUN 16 12/05/2012 1557   BUN 24.2 09/10/2012 1315   CREATININE 0.80 12/05/2012 1557   CREATININE 1.0 09/10/2012 1315   CALCIUM 9.2 12/05/2012 1557   CALCIUM 9.9 09/10/2012 1315   GFRNONAA 70* 12/05/2012 1557   GFRAA 81* 12/05/2012 1557   CBC    Component Value Date/Time   WBC 7.2 12/05/2012 1423   WBC 6.2 09/10/2012 1315   RBC 3.05* 12/05/2012 1423   RBC 5.00 09/10/2012 1315   HGB 8.7* 12/05/2012 1423   HGB 13.8 09/10/2012 1315   HCT 26.7* 12/05/2012 1423   HCT 41.4 09/10/2012 1315   PLT 282 12/05/2012 1423   PLT 201 09/10/2012 1315   MCV 87.5 12/05/2012 1423   MCV 82.8 09/10/2012 1315   MCH 28.5 12/05/2012 1423   MCH 27.7 09/10/2012 1315   MCHC 32.6 12/05/2012 1423   MCHC 33.4 09/10/2012 1315   RDW 14.9 12/05/2012 1423   RDW 16.8*  09/10/2012 1315   LYMPHSABS 0.9 12/03/2012 0548   LYMPHSABS 1.7 09/10/2012 1315   MONOABS 0.4 12/03/2012 0548   MONOABS 0.4 09/10/2012 1315   EOSABS 0.4 12/03/2012 0548   EOSABS 0.2 09/10/2012 1315   BASOSABS 0.1 12/03/2012 0548   BASOSABS 0.2* 09/10/2012 1315    Studies/Results: Ct Head Wo Contrast  12/05/2012   *RADIOLOGY REPORT*  Clinical Data: Confusion  CT HEAD WITHOUT CONTRAST  Technique:  Contiguous axial images were obtained from the base of the skull through the vertex without contrast.  Comparison: CT 11/26/2008  Findings: Image quality degraded by motion.  Multiple images were repeated secondary to motion.  Generalized atrophy.  Chronic left parietal infarct unchanged. Chronic microvascular ischemia in the white matter, unchanged.  No acute infarct.  Negative for hemorrhage or mass.  Atherosclerotic disease.  No skull abnormality is detected.  IMPRESSION: Atrophy and chronic ischemic change.  No acute abnormality.   Original Report Authenticated By: Janeece Riggers, M.D.    Medications: I have reviewed the patient's current medications.  Assessment/Plan:   1. Right periprosthetic femoral shaft fracture. Status post ORIF 12/01/2012.  2. DVT Prophylaxis/Anticoagulation: Chronic Coumadin therapy. INR 1.40. Subcutaneous Lovenox for INR greater than 2.00  3. Pain Management: Hydrocodone  and Robaxin as needed.  -scheduled hydrocodone before am and pm therapy  -would like to avoid long acting narc if possible  -utilize local remedies for pain also  4. Mood: Xanax 1 mg every 4 hours as needed. Wellbutrin 300 mg daily, Zoloft 150 mg daily. Provide emotional support  -will schedule am xanax as well  5. Neuropsych: This patient is capable of making decisions on her own behalf.  6. Hypertension. Norvasc 5 mg daily. Monitor for the effects of increased mobility and pain upon her BP.  7. Hyperlipidemia. Lipitor  8. 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 Urinary incontinence.  -is seen by  urology as an outpt-- UDS was planned  -PVR 177-249 so far  -ua neg, culture pending 10. Anemia - will watch. Ordered CBC    Length of stay, days: 5  Sonda Primes , MD 12/07/2012, 8:25 AM

## 2012-12-07 NOTE — Progress Notes (Signed)
Physical Therapy Session Note  Patient Details  Name: Renee Adams MRN: [id number] Date of Birth: 1933-10-15  Today's Date: 12/07/2012 Time: 4098-1191 Time Calculation (min): 60 min  Short Term Goals: Week 1:  PT Short Term Goal 1 (Week 1): Pt will perform functional transfers with min A PT Short Term Goal 2 (Week 1): Pt will gait in controlled environment with min A 50'  Skilled Therapeutic Interventions/Progress Updates:   Initial part of session focused on bed mobility and functional sitting balance and activity tolerance seated EOB to eat breakfast. 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 Sit to stand from EOB with RW with min A and gait into the bathroom with steady A; cues for posture, step length, and encouragement. Very slow cadence noted. Sit to stands and dynamic standing balance for hygiene and toileting with overall min A. Gait back out of bathroom with improved cadence and set up in w/c with call bell in reach. RN present to administer medication.  Therapy Documentation Precautions:  Precautions Precautions: Posterior Hip;Fall Precaution Comments: Bilateral Posterior Hip precautions Restrictions Weight Bearing Restrictions: No RLE Weight Bearing: Weight bearing as tolerated Pain: Premedicated for pain in RLE at 4am per pt report but called RN to check on time for next dose as pain increases with mobility.  See FIM for current functional status  Therapy/Group: Individual Therapy  Karolee Stamps [facility] 12/07/2012, 8:59 AM

## 2012-12-07 NOTE — Progress Notes (Signed)
Physical Therapy Session Note  Patient Details  Name: Renee Adams MRN: [id number] Date of Birth: 05-12-1934  Today's Date: 12/07/2012 Time: 4098-1191 Time Calculation (min): 58 min  Short Term Goals: Week 1:  PT Short Term Goal 1 (Week 1): Pt will perform functional transfers with min A PT Short Term Goal 2 (Week 1): Pt will gait in controlled environment with min A 50'  Skilled Therapeutic Interventions/Progress Updates:    Standing and seated therex for functional RLE strengthening including hip abduction, LAQ, hamstring curls, and heel/toe raises (bilaterally) x 10 reps x 2 sets each. Min to mod A needed for sit stands with cues for hand placement. Reviewed precautions for hip; able to verbalize 2/3.  Gait with RW x 20' with steady A with step-to pattern noted and decreased gait velocity. Pt with need for BM; returned to room and transferred to toilet with min A; pt able to manage pants with steady A but needed assist with brief; pulling back up required A to maintain precautions. Transferred back to bed end of session with mod A for sit to stand and steady A for stand step part of transfer. Assist needed with RLE for sit to supine and repositioned. Notified RN at end of session pt requesting pain medication.  Therapy Documentation Precautions:  Precautions Precautions: Posterior Hip;Fall Precaution Comments: Bilateral Posterior Hip precautions Restrictions Weight Bearing Restrictions: No RLE Weight Bearing: Weight bearing as tolerated  Pain: Premedicated. Reports 4/10 pain in RLE  See FIM for current functional status  Therapy/Group: Individual Therapy  Karolee Stamps [facility] 12/07/2012, 2:41 PM

## 2012-12-07 NOTE — Progress Notes (Signed)
Physical Therapy Note  Patient Details  Name: Renee Adams MRN: [id number] Date of Birth: [birth year] Today's Date: 12/07/2012 Time:  1030-1130  (60 min)  1st session Pain:  5/10 Right knee and hip Individual session  (1st session)  1st session:  Engaged in bathing and dressing at shower level.  Treatment focused on transfers, standing balance, activity tolerance.  Pt ambulated to shower with RW and minimal assist.  Needed increased time for functional  Mobility.  Pt bathed self with assistance with feet.  Stood with minimal assist for peri area.   2nd session:  1430-1500  (30 min)  Pain 2/10 with no movement; 8/10 with movement; Individual session Treatment focus:  Bed mobility= Supervision plus increased time; sat EOB and brushed teeth with set up provided.  Returned to bed.  Pt. Able to lift RLE in bed w/o assistance.       Humberto Seals 12/07/2012, 2:38 PM

## 2012-12-08 ENCOUNTER — Inpatient Hospital Stay (HOSPITAL_COMMUNITY): Payer: Medicare Other | Admitting: Physical Therapy

## 2012-12-08 ENCOUNTER — Inpatient Hospital Stay (HOSPITAL_COMMUNITY): Payer: Medicare Other | Admitting: *Deleted

## 2012-12-08 ENCOUNTER — Inpatient Hospital Stay (HOSPITAL_COMMUNITY): Payer: Medicare Other

## 2012-12-08 ENCOUNTER — Inpatient Hospital Stay (HOSPITAL_COMMUNITY): Payer: Medicare Other | Admitting: Occupational Therapy

## 2012-12-08 ENCOUNTER — Ambulatory Visit: Payer: Medicare Other | Admitting: Infectious Diseases

## 2012-12-08 LAB — CBC
HCT: 32.9 % — ABNORMAL LOW (ref 36.0–46.0)
MCH: 29 pg (ref 26.0–34.0)
MCV: 88.4 fL (ref 78.0–100.0)
RDW: 15.7 % — ABNORMAL HIGH (ref 11.5–15.5)
WBC: 6.9 10*3/uL (ref 4.0–10.5)

## 2012-12-08 MED ORDER — ACETAMINOPHEN 500 MG PO TABS
500.0000 mg | ORAL_TABLET | Freq: Two times a day (BID) | ORAL | Status: DC
  Administered 2012-12-08 – 2012-12-17 (×18): 500 mg via ORAL
  Filled 2012-12-08 (×24): qty 1

## 2012-12-08 NOTE — Plan of Care (Signed)
Problem: RH PAIN MANAGEMENT Goal: RH STG PAIN MANAGED AT OR BELOW PT'S PAIN GOAL Pt will rate pain less than 3 on scale of 0-10 with min assist  Outcome: Not Progressing Currently reports 6 pain scale

## 2012-12-08 NOTE — Progress Notes (Signed)
Physical Therapy Session Note  Patient Details  Name: Renee Adams MRN: [id number] Date of Birth: Mar 19, 1934  Today's Date: 12/08/2012 Time: 1430-1500 Time Calculation (min): 30 min  Short Term Goals: Week 1:  PT Short Term Goal 1 (Week 1): Pt will perform functional transfers with min A PT Short Term Goal 2 (Week 1): Pt will gait in controlled environment with min A 50'  Skilled Therapeutic Interventions/Progress Updates:    Patient received sitting in wheelchair, c/o pain and fatigue. Patient reports she has been up since 7am and is very tired. Session focused on sit<>stand transfers and gait training in parallel bars. Patient performed several sit<>stand transfers in parallel bars with use of B UEs to push up from armrests. Patient performed 2 bouts x8' of gait training in parallel bars with min assist. Patient performed 2 sets x5 reps sit<>stand transfers in parallel bars with use of B UEs to push up from armrests. Patient returned to room and transferred wheelchair to bed with RW and min assist. Patient requires min assist for sit>supine. Patient left supine in bed with all needs within reach.  Therapy Documentation Precautions:  Precautions Precautions: Posterior Hip;Fall Precaution Comments: Bilateral Posterior Hip precautions Restrictions Weight Bearing Restrictions: No RLE Weight Bearing: Weight bearing as tolerated Other Position/Activity Restrictions: OK for Hip ROM (within post prec), and no restrictions on knee ROM Pain: Pain Assessment Pain Assessment: 0-10 Pain Score:   4 Pain Type: Acute pain;Surgical pain Pain Location: Leg Pain Orientation: Right Pain Descriptors / Indicators: Aching Pain Onset: With Activity Pain Intervention(s): Repositioned;Ambulation/increased activity Multiple Pain Sites: No Locomotion : Ambulation Ambulation/Gait Assistance: 4: Min assist   See FIM for current functional status  Therapy/Group: Individual Therapy  Chipper Herb. Masayoshi Couzens, PT, DPT  12/08/2012, 3:25 PM

## 2012-12-08 NOTE — Progress Notes (Signed)
Physical Therapy Session Note  Patient Details  Name: Renee Adams MRN: [id number] Date of Birth: 16-Oct-1933  Today's Date: 12/08/2012 Time: 4098-1191 Time Calculation (min): 55 min  Short Term Goals: Week 1:  PT Short Term Goal 1 (Week 1): Pt will perform functional transfers with min A PT Short Term Goal 2 (Week 1): Pt will gait in controlled environment with min A 50'  Skilled Therapeutic Interventions/Progress Updates:    Much better alertness and cognition as compared to Friday, still with impaired memory.   Finishing breakfast upon entry. Ambulation to/from bathroom ~30' total with RW and up to min assist particularly with uneven floor. Cues for safety and RW negotiation, positioning appropriately prior to sitting to 3n1 and wheelchair. Pt moves at very slow pace. Min assist for sit <> stands from bed, 3n1, wheelchair. Gait x 45' with RW and min assist, limited by endurance and pain. Attempted small step up without moving RW up/down, pt only able to tolerate 1 rep due to pain. Wheelchair propulsion x 150' with supervision and encouragement to continue. Reviewed posterior hip precautions, pt remembered 2/3, needed cues for avoiding internal rotation. Pt needs min/mod verbal cues during session to adhere to precautions.   Therapy Documentation Precautions:  Precautions Precautions: Posterior Hip;Fall Precaution Comments: Bilateral Posterior Hip precautions Restrictions Weight Bearing Restrictions: No RLE Weight Bearing: Weight bearing as tolerated Pain:  5/10 Rt. LE, declines medication; rested as needed  See FIM for current functional status  Therapy/Group: Individual Therapy  Wilhemina Bonito 12/08/2012, 12:28 PM

## 2012-12-08 NOTE — Progress Notes (Signed)
Note reviewed and accurately reflects treatment session.   

## 2012-12-08 NOTE — Progress Notes (Signed)
ANTICOAGULATION CONSULT NOTE - Follow Up Consult  Pharmacy Consult:  Coumadin Indication: History ASD, TIA  Allergies  Allergen Reactions  . Dilaudid (Hydromorphone Hcl) Shortness Of Breath  . Morphine And Related Shortness Of Breath  . Sulfa Antibiotics Other (See Comments)    "Made me drunk";  wobbly  . Keflex (Cephalexin) Nausea Only  . Levaquin (Levofloxacin) Hives and Rash    Patient Measurements: Weight: 158 lb (71.668 kg)   Vital Signs: Temp: 97.8 F (36.6 C) (06/02 0446) Temp src: Oral (06/02 0446) BP: 110/60 mmHg (06/02 0845) Pulse Rate: 80 (06/02 0446)  Labs:  Recent Labs  12/05/12 1423 12/05/12 1557 12/06/12 0525 12/07/12 0510 12/08/12 0555 12/08/12 0819  HGB 8.7*  --   --   --   --  10.8*  HCT 26.7*  --   --   --   --  32.9*  PLT 282  --   --   --   --  388  LABPROT  --   --  27.0* 25.2* 27.1*  --   INR  --   --  2.65* 2.42* 2.67*  --   CREATININE  --  0.80  --   --   --   --     The CrCl is unknown because both a height and weight (above a minimum accepted value) are required for this calculation.    Assessment: 79yo female with history of ASD and TIA resumed on Coumadin s/p repair of femur fracture.  INR (2.67) remains therapeutic and appears stable; Hgb 10.8, improving, plt 338, no bleeding reported.   Goal of Therapy:  INR 2-3 Monitor platelets by anticoagulation protocol: Yes    Plan:  - Continue home Coumadin: alternating 3mg  and 2 mg PO every other day - Continue PT / INR to MWF only  Bayard Hugger, PharmD, BCPS  Clinical Pharmacist  Pager: [phone]   12/08/2012, 10:59 AM

## 2012-12-08 NOTE — Progress Notes (Signed)
Occupational Therapy Session Note  Patient Details  Name: Renee Adams MRN: [id number] Date of Birth: 1933/07/27  Today's Date: 12/08/2012 Time: 4098-1191 Time Calculation (min): 45 min  Short Term Goals: Week 1:  OT Short Term Goal 1 (Week 1): Transfer to BSC/ commode with mod A  OT Short Term Goal 2 (Week 1): Perform sit to stand with mod A OT Short Term Goal 3 (Week 1): Perform standing balance with mod A OT Short Term Goal 4 (Week 1): Perform clothing management with mod A for dressing and toileting  Skilled Therapeutic Interventions/Progress Updates:    Therapy session focused on safety with hip precautions, standing balance/tolerance, and activity tolerance during ADL retraining. Pt request to wash UB only on this date. Completed oral care in standing (approx 2 min) before requiring rest break. Pt slow to move when completing sit<>stand transfers as she is fearful of pain. Engaged in table top activity in standing approx 2-3 min at a time before requiring rest break. Pt completed sit<>stand transfers with min-CGA. Pt left with call light in reach and quick release belt on.   Therapy Documentation Precautions:  Precautions Precautions: Posterior Hip;Fall Precaution Comments: Bilateral Posterior Hip precautions Restrictions Weight Bearing Restrictions: No RLE Weight Bearing: Weight bearing as tolerated General:   Vital Signs:   Pain: 2/10 in RLE and hip  See FIM for current functional status  Therapy/Group: Individual Therapy  Daneil Dan 12/08/2012, 2:30 PM

## 2012-12-08 NOTE — Progress Notes (Signed)
Occupational Therapy Session Note  Patient Details  Name: DAYNA ALIA MRN: [id number] Date of Birth: 06-27-1934  Today's Date: 12/08/2012  Short Term Goals: Week 1:  OT Short Term Goal 1 (Week 1): Transfer to BSC/ commode with mod A  OT Short Term Goal 2 (Week 1): Perform sit to stand with mod A OT Short Term Goal 3 (Week 1): Perform standing balance with mod A OT Short Term Goal 4 (Week 1): Perform clothing management with mod A for dressing and toileting  Skilled Therapeutic Interventions/Progress Updates:  Session Note: Time: 1330-1400 (30 mins) Pt reported 6/10 pain during standing activity. RN notified & administered pain meds during tx session.  Upon entering room, pt seated in w/c. OT pushed pt to rehab gym and engaged in dynamic standing activity. Skilled OT intervention focused on dynamic standing balance/endurance without UE support, overall activity tolerance, UE strengthening/coordination, fxal use of BUE's and sit->stand from w/c level. At end of tx session, pt seated in w/c with call bell and phone within reach.  Therapy Documentation Precautions:  Precautions Precautions: Posterior Hip;Fall Precaution Comments: Bilateral Posterior Hip precautions Restrictions Weight Bearing Restrictions: No RLE Weight Bearing: Weight bearing as tolerated  See FIM for current functional status  Therapy/Group: Individual Therapy  Tresea Heine 12/08/2012, 7:19 AM

## 2012-12-08 NOTE — Progress Notes (Signed)
Subjective/Complaints: Right hip sore. More alert today.  A 12 point review of systems has been performed and if not noted above is otherwise negative.   Objective: Vital Signs: Blood pressure 110/60, pulse 80, temperature 97.8 F (36.6 C), temperature source Oral, resp. rate 18, weight 71.668 kg (158 lb), SpO2 96.00%. No results found.  Recent Labs  12/05/12 1423 12/08/12 0819  WBC 7.2 6.9  HGB 8.7* 10.8*  HCT 26.7* 32.9*  PLT 282 388    Recent Labs  12/05/12 1557  NA 142  K 3.7  CL 107  GLUCOSE 85  BUN 16  CREATININE 0.80  CALCIUM 9.2   CBG (last 3)  No results found for this basename: GLUCAP,  in the last 72 hours  Wt Readings from Last 3 Encounters:  12/04/12 71.668 kg (158 lb)  11/28/12 63.504 kg (140 lb)  11/28/12 63.504 kg (140 lb)    Physical Exam:  Constitutional: She is oriented to person, place, and time. She appears well-developed and well-nourished.  HENT:  Head: Normocephalic and atraumatic.  Right Ear: External ear normal.  Left Ear: External ear normal.  Eyes: Conjunctivae and EOM are normal. Pupils are equal, round, and reactive to light. Right eye exhibits no discharge. Left eye exhibits no discharge. No scleral icterus.  Neck: Normal range of motion. Neck supple. No JVD present. No tracheal deviation present. No thyromegaly present.  Cardiovascular: Normal rate and regular rhythm. Exam reveals no friction rub.  No murmur heard.  Pulmonary/Chest: Effort normal and breath sounds normal. No respiratory distress. She has no wheezes.  Abdominal: Soft. Bowel sounds are normal. She exhibits no distension and no mass. There is no tenderness. There is no rebound and no guarding.  Musculoskeletal:    Joint deformities in the hands.  Lymphadenopathy:  She has no cervical adenopathy.  Neurological: She is alert and oriented to person, place, and time. UE is 4/5. RLE is 2-3/5 (pain) proximally to 4-/5 distally. LLE is 4/5 proximally to distally.  Skin:  No rash noted. No erythema.  Surgical site clean and dry with staples. Swelling noted with bruising around incision site. Right thigh is tender. Psychiatric: She has a normal mood and affect. Her behavior is normal. Judgment and thought content normal    Assessment/Plan: 1. Functional deficits secondary to right peri-prosthetic femoral shaft fx which require 3+ hours per day of interdisciplinary therapy in a comprehensive inpatient rehab setting. Physiatrist is providing close team supervision and 24 hour management of active medical problems listed below. Physiatrist and rehab team continue to assess barriers to discharge/monitor patient progress toward functional and medical goals. FIM: FIM - Bathing Bathing Steps Patient Completed: Chest;Right Arm;Left Arm;Abdomen;Front perineal area;Buttocks;Left upper leg;Right upper leg Bathing: 4: Min-Patient completes 8-9 40f 10 parts or 75+ percent  FIM - Upper Body Dressing/Undressing Upper body dressing/undressing steps patient completed: Thread/unthread right bra strap;Thread/unthread left bra strap;Hook/unhook bra;Thread/unthread right sleeve of pullover shirt/dresss;Thread/unthread left sleeve of pullover shirt/dress;Put head through opening of pull over shirt/dress;Pull shirt over trunk Upper body dressing/undressing: 5: Supervision: Safety issues/verbal cues FIM - Lower Body Dressing/Undressing Lower body dressing/undressing steps patient completed: Pull underwear up/down;Pull pants up/down Lower body dressing/undressing: 2: Max-Patient completed 25-49% of tasks  FIM - Toileting Toileting: 0: Activity did not occur  FIM - Diplomatic Services operational officer Devices: Elevated toilet seat;Grab bars;Walker Toilet Transfers: 4-To toilet/BSC: Min A (steadying Pt. > 75%);4-From toilet/BSC: Min A (steadying Pt. > 75%)  FIM - Bed/Chair Transfer Bed/Chair Transfer Assistive Devices: Arm rests;Bed rails;Dan Humphreys  Bed/Chair Transfer: 4: Supine  > Sit: Min A (steadying Pt. > 75%/lift 1 leg);4: Bed > Chair or W/C: Min A (steadying Pt. > 75%);4: Sit > Supine: Min A (steadying pt. > 75%/lift 1 leg);3: Chair or W/C > Bed: Mod A (lift or lower assist)  FIM - Locomotion: Wheelchair Distance: 40 Locomotion: Wheelchair:  (went to bathroom with wheelchair) FIM - Locomotion: Ambulation Locomotion: Ambulation Assistive Devices: Designer, industrial/product Ambulation/Gait Assistance: 4: Min guard Locomotion: Ambulation: 1: Travels less than 50 ft with minimal assistance (Pt.>75%)  Comprehension Comprehension Mode: Auditory Comprehension: 5-Follows basic conversation/direction: With no assist  Expression Expression Mode: Verbal Expression: 5-Expresses basic 90% of the time/requires cueing < 10% of the time.  Social Interaction Social Interaction: 6-Interacts appropriately with others with medication or extra time (anti-anxiety, antidepressant).  Problem Solving Problem Solving: 5-Solves complex 90% of the time/cues < 10% of the time  Memory Memory: 5-Recognizes or recalls 90% of the time/requires cueing < 10% of the time  Medical Problem List and Plan:  1. Right periprosthetic femoral shaft fracture. Status post ORIF 12/01/2012.  2. DVT Prophylaxis/Anticoagulation: Chronic Coumadin therapy. INR 1.40. Subcutaneous Lovenox for INR greater than 2.00  3. Pain Management: Hydrocodone and Robaxin as needed.  -schedule tylenol  -will need to minimize narcotic use  -utilize local remedies for pain also 4. Mood: Xanax 1 mg every 4 hours as needed. Wellbutrin 300 mg daily, Zoloft 150 mg daily. Provide emotional support  -scheduled xanax qd  5. Neuropsych: This patient is capable of making decisions on her own behalf.  6. Hypertension. Norvasc 5 mg daily. Monitor for the effects of increased mobility and pain upon her BP.  7. Hyperlipidemia. Lipitor  8. 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 Urinary incontinence.   -is seen by urology  as an outpt-- UDS was planned  -ua neg, culture negative  LOS (Days) 6 A FACE TO FACE EVALUATION WAS PERFORMED  SWARTZ,ZACHARY T 12/08/2012 8:57 AM

## 2012-12-08 NOTE — Plan of Care (Signed)
Problem: RH SAFETY Goal: RH STG ADHERE TO SAFETY PRECAUTIONS W/ASSISTANCE/DEVICE STG Adhere to Safety Precautions With Assistance/Device. Supervision  Outcome: Progressing No unsafe behavior reported

## 2012-12-08 NOTE — Progress Notes (Signed)
Physical Therapy Note  Patient Details  Name: Renee Adams MRN: [id number] Date of Birth: 1934-04-30 Today's Date: 12/08/2012  4098-1191 (45 minutes) individual Pain: 2/10 RT hip/premedicated Precautions: WBAT RT LE/posterior hip precautions Focus of treatment: therapeutic exercise focused on bilateral LE strengthening Treatment: wc mobility- 120 feet unit SBA; transfers- stand /turn RW SBA ; sit to supine (mat) SBA with difficulty RT LE; supine to sit SBA ; Therapeutic exercise bilateral X 20- ankle pumps, heel slides (AA on right), SAQs, quad sets, hip abduction (AA on right); standing hip flexion on RT X 20 with RW support.   Gjon Letarte,JIM 12/08/2012, 9:40 AM

## 2012-12-09 ENCOUNTER — Inpatient Hospital Stay (HOSPITAL_COMMUNITY): Payer: Medicare Other | Admitting: Physical Therapy

## 2012-12-09 ENCOUNTER — Inpatient Hospital Stay (HOSPITAL_COMMUNITY): Payer: Medicare Other | Admitting: Occupational Therapy

## 2012-12-09 NOTE — Progress Notes (Signed)
Occupational Therapy Session Note  Patient Details  Name: Renee Adams MRN: [id number] Date of Birth: 1934-02-05  Today's Date: 12/09/2012 Time: 4098-1191 Time Calculation (min): 44 min  Skilled Therapeutic Interventions/Progress Updates:    Patient scheduled for 45 minute OT session following OT ADL this am.  Patient in process of dressing herself after shower this am upon arrival.  Took over this session to allow patient to complete dressing and hygiene.  Patient stood with increased time and minimal contact (minimal assistance) initially, subsequent stands required only supervision and verbal cueing.  Patient able to effectively use reacher to don pants.  Patient walked to sink 5 feet, then stood to apply deodorant, brush teeth, and comb hair.  Patient with limited ability to weight shift forward over her feet, but frequently able to completely lift upper extremities off of surface support to complete functional task.  Patient stood for more than 5 minutes without difficulty.  Patient assisted back to bed for brief rest before lunch per her request.    Therapy Documentation Precautions:  Precautions Precautions: Posterior Hip;Fall Precaution Comments: Bilateral Posterior Hip precautions Restrictions Weight Bearing Restrictions: No RLE Weight Bearing: Weight bearing as tolerated Other Position/Activity Restrictions: OK for Hip ROM (within post prec), and no restrictions on knee ROM   Pain:  2/10 right femur and knee  See FIM for current functional status  Therapy/Group: Individual Therapy  [name] 12/09/2012, 11:22 AM

## 2012-12-09 NOTE — Progress Notes (Signed)
Physical Therapy Weekly Progress Note  Patient Details  Name: Renee Adams MRN: [id number] Date of Birth: 04-29-1934  Today's Date: 12/09/2012 Time: 1306-1400 Time Calculation (min): 54 min  Patient has met 2 of 2 short term goals.  Pt able to transfer sit <> stand with min assist and is able to ambulate a distance of 91' with RW with min assist. She has had slightly slower progress than expected due to impaired cognition with a few days worse than others (felt to be medication related). Pt is progressing however is now expected to need 24 hour supervision due to delayed processing and decreased cognition which elevate her risk of falls.   Patient continues to demonstrate the following deficits: decreased standing balance and endurance, pain, decreased mobility, delayed processing and overall cognition and therefore will continue to benefit from skilled PT intervention to enhance overall performance with activity tolerance, balance, postural control, ability to compensate for deficits, awareness and knowledge of precautions.  Patient progressing toward long term goals..  Plan of care revisions: long term goals updated due to expected need for supervision at D/C.  PT Short Term Goals Week 1:  PT Short Term Goal 1 (Week 1): Pt will perform functional transfers with min A PT Short Term Goal 1 - Progress (Week 1): Met PT Short Term Goal 2 (Week 1): Pt will gait in controlled environment with min A 50' PT Short Term Goal 2 - Progress (Week 1): Not met  Skilled Therapeutic Interventions/Progress Updates:    Pt able to verbalize 3/3 posterior hip precautions without cues however needed verbal cues during functional activity to maintain.     Pt just received lunch as therapist entered. Pt missed  6 min skilled PT time due to finishing lunch. Min to min-guard assist for sit <> stand transitions from bed and wheelchair, cues needed for maintaining precautions. Ambulation x 50', 30' with RW and  min-guard assist with step-to gait and very reliant on UEs. Cues for glute contraction during stance on Rt. Practiced heel Lt. LE heel taps to short dot with RW support working on rt. LE weight bearing and to get pt ready for step up in house. Stand pivot to bed with min assist and sit > supine with min assist and cues for precautions. Pt in bed for dressing change per nursing request.  Therapy Documentation Precautions:  Precautions Precautions: Posterior Hip;Fall Precaution Comments: Bilateral Posterior Hip precautions Restrictions Weight Bearing Restrictions: No RLE Weight Bearing: Weight bearing as tolerated Other Position/Activity Restrictions: OK for Hip ROM (within post prec), and no restrictions on knee ROM General: Amount of Missed PT Time (min): 6 Minutes Missed Time Reason: Other (comment) Pain: Pain Assessment Pain Assessment: No/denies pain Pain Score: 0-No pain  See FIM for current functional status  Therapy/Group: Individual Therapy  Renee Adams 12/09/2012, 2:45 PM

## 2012-12-09 NOTE — Progress Notes (Signed)
Subjective/Complaints: Right hip still tender. Scheduled tylenol not making much of a difference A 12 point review of systems has been performed and if not noted above is otherwise negative.   Objective: Vital Signs: Blood pressure 103/64, pulse 84, temperature 98 F (36.7 C), temperature source Oral, resp. rate 18, weight 71.668 kg (158 lb), SpO2 94.00%. No results found.  Recent Labs  12/08/12 0819  WBC 6.9  HGB 10.8*  HCT 32.9*  PLT 388   No results found for this basename: NA, K, CL, CO, GLUCOSE, BUN, CREATININE, CALCIUM,  in the last 72 hours CBG (last 3)  No results found for this basename: GLUCAP,  in the last 72 hours  Wt Readings from Last 3 Encounters:  12/04/12 71.668 kg (158 lb)  11/28/12 63.504 kg (140 lb)  11/28/12 63.504 kg (140 lb)    Physical Exam:  Constitutional: She is oriented to person, place, and time. She appears well-developed and well-nourished.  HENT:  Head: Normocephalic and atraumatic.  Right Ear: External ear normal.  Left Ear: External ear normal.  Eyes: Conjunctivae and EOM are normal. Pupils are equal, round, and reactive to light. Right eye exhibits no discharge. Left eye exhibits no discharge. No scleral icterus.  Neck: Normal range of motion. Neck supple. No JVD present. No tracheal deviation present. No thyromegaly present.  Cardiovascular: Normal rate and regular rhythm. Exam reveals no friction rub.  No murmur heard.  Pulmonary/Chest: Effort normal and breath sounds normal. No respiratory distress. She has no wheezes.  Abdominal: Soft. Bowel sounds are normal. She exhibits no distension and no mass. There is no tenderness. There is no rebound and no guarding.  Musculoskeletal:    Joint deformities in the hands.  Lymphadenopathy:  She has no cervical adenopathy.  Neurological: She is alert and oriented to person, place, and time. UE is 4/5. RLE is 2-3/5 (pain) proximally to 4-/5 distally. LLE is 4/5 proximally to distally.  Skin: No  rash noted. No erythema.  Surgical site clean and dry with staples. Swelling noted with bruising around incision site. Right thigh is tender. Psychiatric: She has a normal mood and affect. Her behavior is normal. Judgment and thought content normal    Assessment/Plan: 1. Functional deficits secondary to right peri-prosthetic femoral shaft fx which require 3+ hours per day of interdisciplinary therapy in a comprehensive inpatient rehab setting. Physiatrist is providing close team supervision and 24 hour management of active medical problems listed below. Physiatrist and rehab team continue to assess barriers to discharge/monitor patient progress toward functional and medical goals. FIM: FIM - Bathing Bathing Steps Patient Completed: Right Arm;Left Arm;Chest;Abdomen (requested to wash UB only ) Bathing: 5: Set-up assist to: Obtain items  FIM - Upper Body Dressing/Undressing Upper body dressing/undressing steps patient completed: Thread/unthread right bra strap;Thread/unthread left bra strap;Hook/unhook bra;Thread/unthread right sleeve of pullover shirt/dresss;Thread/unthread left sleeve of pullover shirt/dress;Put head through opening of pull over shirt/dress;Pull shirt over trunk Upper body dressing/undressing: 5: Set-up assist to: Obtain clothing/put away FIM - Lower Body Dressing/Undressing Lower body dressing/undressing steps patient completed: Pull underwear up/down;Pull pants up/down Lower body dressing/undressing: 0: Activity did not occur  FIM - Toileting Toileting: 0: Activity did not occur  FIM - Diplomatic Services operational officer Devices: Bedside commode;Grab bars;Walker Pensions consultant Transfers: 0-Activity did not occur  FIM - Midwife: Environmental consultant;Bed rails Bed/Chair Transfer: 4: Bed > Chair or W/C: Min A (steadying Pt. > 75%);4: Chair or W/C > Bed: Min A (steadying Pt. > 75%);4: Sit >  Supine: Min A (steadying pt. > 75%/lift 1  leg)  FIM - Locomotion: Wheelchair Distance: 40 Locomotion: Wheelchair: 0: Activity did not occur FIM - Locomotion: Ambulation Locomotion: Ambulation Assistive Devices: Parallel bars Ambulation/Gait Assistance: 4: Min assist Locomotion: Ambulation: 1: Travels less than 50 ft with minimal assistance (Pt.>75%)  Comprehension Comprehension Mode: Auditory Comprehension: 5-Understands complex 90% of the time/Cues < 10% of the time  Expression Expression Mode: Verbal Expression: 5-Expresses complex 90% of the time/cues < 10% of the time  Social Interaction Social Interaction: 6-Interacts appropriately with others with medication or extra time (anti-anxiety, antidepressant).  Problem Solving Problem Solving: 5-Solves complex 90% of the time/cues < 10% of the time  Memory Memory: 5-Recognizes or recalls 90% of the time/requires cueing < 10% of the time  Medical Problem List and Plan:  1. Right periprosthetic femoral shaft fracture. Status post ORIF 12/01/2012.  2. DVT Prophylaxis/Anticoagulation: Chronic Coumadin therapy. INR 1.40. Subcutaneous Lovenox for INR greater than 2.00  3. Pain Management: Hydrocodone and Robaxin as needed.  -schedule tylenol for now  -will need to minimize narcotic use  -utilize local remedies for pain also 4. Mood: Xanax 1 mg every 4 hours as needed. Wellbutrin 300 mg daily, Zoloft 150 mg daily. Provide emotional support  -scheduled xanax qd  5. Neuropsych: This patient is capable of making decisions on her own behalf.  6. Hypertension. Norvasc 5 mg daily. Monitor for the effects of increased mobility and pain upon her BP.  7. Hyperlipidemia. Lipitor  8. 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 Urinary incontinence.   -is seen by urology as an outpt-- UDS was planned  -ua neg, culture negative  LOS (Days) 7 A FACE TO FACE EVALUATION WAS PERFORMED  SWARTZ,ZACHARY T 12/09/2012 7:21 AM

## 2012-12-09 NOTE — Patient Care Conference (Signed)
Inpatient RehabilitationTeam Conference and Plan of Care Update Date: 12/09/2012   Time: 2:05 PM    Patient Name: Renee Adams      Medical Record Number: [id number]  Date of Birth: 1934/04/21 Sex: Female         Room/Bed: 4003/4003-01 Payor Info: Payor: MEDICARE / Plan: MEDICARE PART A AND B / Product Type: *No Product type* /    Admitting Diagnosis: fem fx  Admit Date/Time:  12/02/2012  5:57 PM Admission Comments: No comment available   Primary Diagnosis:  Fracture of femoral neck, right Principal Problem: Fracture of femoral neck, right  Patient Active Problem List   Diagnosis Date Noted  . Fracture of femoral neck, right 12/03/2012  . Fall 11/26/2012  . Fracture of femoral shaft, right, closed 11/26/2012  . UTI (urinary tract infection) 11/26/2012  . Essential hypertension, benign 08/18/2012  . Status post left hip replacement 04/18/2012  . Osteomyelitis of left hip 03/31/2012  . Breast cancer, left breast 09/12/2011    Expected Discharge Date: Expected Discharge Date: 12/16/12  Team Members Present: Physician leading conference: Dr. Faith Rogue Social Worker Present: Amada Jupiter, LCSW Nurse Present: Other (comment) Keturah Barre, RN) PT Present: Karolee Stamps, PT;Other (comment) Sherrine Maples, PT) OT Present: Edwin Cap, Loistine Chance, OT Other (Discipline and Name): Ottie Glazier, RN;  Tora Duck, PPS Coordinator     Current Status/Progress Goal Weekly Team Focus  Medical   right per-prosthetic femur fx. anxiety better  pain control, wound care  see above, mental status   Bowel/Bladder   Continent of bowel and bladder. LBM 12/07/12. Experiences urinary urgency, per report  Pt to remain continent of bowel and bladder.  Monitor and adjust accordingly   Swallow/Nutrition/ Hydration             ADL's   UB dressing (min A), LB dressing (total A), toileting (max A), bathing (min A), tub transfers (steady A)  UB dressing (independent), LB dressing  (supervision), toileting (Mod I), bathing (supervision), tub transfers (min A), dynamic standing (superivision)  dynamic standing balance/endurance without UE support, fxal mobility/transfers, BADL's   Mobility   min assist   supervision  safety with mobility, endurance, strength, pain management   Communication             Safety/Cognition/ Behavioral Observations            Pain   Scheduled Tylenol 500mg  at breakfast and lunch. Vicodin 1-2 tab q 6hrs prn  <3  Offer pain medication 1 hour prior to initial therapy   Skin   R hip fx incision with miraplex dressing and steri intact  No additional skin breakdown  Monitor incision for appropriate healing    Rehab Goals Patient on target to meet rehab goals: Yes *See Care Plan and progress notes for long and short-term goals.  Barriers to Discharge: safety, cognition    Possible Resolutions to Barriers:  supervision at home. supervision goals    Discharge Planning/Teaching Needs:  Home with daughter to stay and provide 24/7 assistance      Team Discussion:  Have decreased pain meds with improved alertness and improving cognition. Still showing some impaired safety awareness and plan to change to supervision.  SW to follow up with family about likely need for about a month of supervision at home.  Revisions to Treatment Plan:  Overall goals changed to supervision.   Continued Need for Acute Rehabilitation Level of Care: The patient requires daily medical management by a physician with specialized training  in physical medicine and rehabilitation for the following conditions: Daily direction of a multidisciplinary physical rehabilitation program to ensure safe treatment while eliciting the highest outcome that is of practical value to the patient.: Yes Daily medical management of patient stability for increased activity during participation in an intensive rehabilitation regime.: Yes Daily analysis of laboratory values and/or radiology  reports with any subsequent need for medication adjustment of medical intervention for : Neurological problems;Other;Post surgical problems  Mcguire Gasparyan 12/09/2012, 2:36 PM

## 2012-12-09 NOTE — Progress Notes (Signed)
Notes reviewed and accurately reflect treatment sessions.   

## 2012-12-09 NOTE — Progress Notes (Signed)
Occupational Therapy Session Notes  Patient Details  Name: Renee Adams MRN: [id number] Date of Birth: 07/12/1933  Today's Date: 12/09/2012  Short Term Goals: Week 1:  OT Short Term Goal 1 (Week 1): Transfer to BSC/ commode with mod A  OT Short Term Goal 2 (Week 1): Perform sit to stand with mod A OT Short Term Goal 3 (Week 1): Perform standing balance with mod A OT Short Term Goal 4 (Week 1): Perform clothing management with mod A for dressing and toileting  Skilled Therapeutic Interventions/Progress Updates:  Session Note #1: Time: 0930-1030 (60 mins) Pt with no c/o pain.  Upon entering room, pt supine in bed. Pt able to recall 3/3 hip precautions. Pt supine->EOB and ambulated to BR using R/W. Prior to ambulation and shower, OT covered dressing to prevent it from getting wet. Pt completed toileting task using R/W and transferred to tub bench. Pt completed UB/LB bathing while seated with sit<>stand for peri care. OT introduced long-handled sponge for bathing. OT applied TEDS and socks then pt ambulated->room using R/W and was in progress of UB dressing @ W/C level at end of tx session. All fxal mobility & transfers were @ a steady assist level. At end of tx session, next OT came in and started session from here.  Session Note #2: Time: 1445-1530 (45 mins) Pt with no c/o pain.  Upon entering room, pt supine in bed and nrsg entered to change dressing. Pt supine->EOB and ambulated->W/C using R/W. From here, OT pushed pt in W/C to day room. Skilled intervention focused on dynamic standing balance/endurance without UE support, overall activity tolerance, fxal use of BUE's, and dynamic sitting balance. At end of session, pt pushed->room in W/C and transferred->bed using R/W. Call bell and phone within reach. Bed alarm turned on.  Therapy Documentation Precautions:  Precautions Precautions: Posterior Hip;Fall Precaution Comments: Bilateral Posterior Hip precautions Restrictions Weight  Bearing Restrictions: No RLE Weight Bearing: Weight bearing as tolerated Other Position/Activity Restrictions: OK for Hip ROM (within post prec), and no restrictions on knee ROM  See FIM for current functional status  Therapy/Group: Individual Therapy  Lakena Sparlin 12/09/2012, 7:17 AM

## 2012-12-09 NOTE — Progress Notes (Signed)
Social Work Patient ID: Renee Adams, female   DOB: [birth year], 79 y.o.   MRN: [id number]  Inpatient RehabilitationTeam Conference and Plan of Care Update Date: 12/09/2012   Time: 2:05 PM     Patient Name: Renee Adams       Medical Record Number: [id number]   Date of Birth: 03-14-1934 Sex: Female         Room/Bed: 4003/4003-01 Payor Info: Payor: MEDICARE / Plan: MEDICARE PART A AND B / Product Type: *No Product type* /   Admitting Diagnosis: fem fx   Admit Date/Time:  12/02/2012  5:57 PM Admission Comments: No comment available   Primary Diagnosis:  Fracture of femoral neck, right Principal Problem: Fracture of femoral neck, right    Patient Active Problem List     Diagnosis  Date Noted   .  Fracture of femoral neck, right  12/03/2012   .  Fall  11/26/2012   .  Fracture of femoral shaft, right, closed  11/26/2012   .  UTI (urinary tract infection)  11/26/2012   .  Essential hypertension, benign  08/18/2012   .  Status post left hip replacement  04/18/2012   .  Osteomyelitis of left hip  03/31/2012   .  Breast cancer, left breast  09/12/2011     Expected Discharge Date: Expected Discharge Date: 12/16/12  Team Members Present: Physician leading conference: Dr. Faith Rogue Social Worker Present: Amada Jupiter, LCSW Nurse Present: Other (comment) Keturah Barre, RN) PT Present: Karolee Stamps, PT;Other (comment) Sherrine Maples, PT) OT Present: Edwin Cap, Loistine Chance, OT Other (Discipline and Name): Ottie Glazier, RN;  Tora Duck, PPS Coordinator        Current Status/Progress  Goal  Weekly Team Focus   Medical     right per-prosthetic femur fx. anxiety better  pain control, wound care  see above, mental status   Bowel/Bladder     Continent of bowel and bladder. LBM 12/07/12. Experiences urinary urgency, per report  Pt to remain continent of bowel and bladder.  Monitor and adjust accordingly   Swallow/Nutrition/ Hydration            ADL's     UB dressing (min  A), LB dressing (total A), toileting (max A), bathing (min A), tub transfers (steady A)  UB dressing (independent), LB dressing (supervision), toileting (Mod I), bathing (supervision), tub transfers (min A), dynamic standing (superivision)  dynamic standing balance/endurance without UE support, fxal mobility/transfers, BADL's   Mobility     min assist  supervision  safety with mobility, endurance, strength, pain management   Communication            Safety/Cognition/ Behavioral Observations           Pain     Scheduled Tylenol 500mg  at breakfast and lunch. Vicodin 1-2 tab q 6hrs prn  <3  Offer pain medication 1 hour prior to initial therapy   Skin     R hip fx incision with miraplex dressing and steri intact  No additional skin breakdown  Monitor incision for appropriate healing    Rehab Goals Patient on target to meet rehab goals: Yes *See Care Plan and progress notes for long and short-term goals.    Barriers to Discharge:  safety, cognition      Possible Resolutions to Barriers:    supervision at home. supervision goals      Discharge Planning/Teaching Needs:    Home with daughter to stay and provide 24/7 assistance  Team Discussion:    Have decreased pain meds with improved alertness and improving cognition. Still showing some impaired safety awareness and plan to change to supervision.  SW to follow up with family about likely need for about a month of supervision at home.   Revisions to Treatment Plan:    Overall goals changed to supervision.    Continued Need for Acute Rehabilitation Level of Care: The patient requires daily medical management by a physician with specialized training in physical medicine and rehabilitation for the following conditions: Daily direction of a multidisciplinary physical rehabilitation program to ensure safe treatment while eliciting the highest outcome that is of practical value to the patient.: Yes Daily medical management of patient  stability for increased activity during participation in an intensive rehabilitation regime.: Yes Daily analysis of laboratory values and/or radiology reports with any subsequent need for medication adjustment of medical intervention for : Neurological problems;Other;Post surgical problems  Renee Adams 12/09/2012, 2:36 PM

## 2012-12-10 ENCOUNTER — Inpatient Hospital Stay (HOSPITAL_COMMUNITY): Payer: Medicare Other

## 2012-12-10 ENCOUNTER — Inpatient Hospital Stay (HOSPITAL_COMMUNITY): Payer: Medicare Other | Admitting: Occupational Therapy

## 2012-12-10 ENCOUNTER — Inpatient Hospital Stay (HOSPITAL_COMMUNITY): Payer: Medicare Other | Admitting: Physical Therapy

## 2012-12-10 DIAGNOSIS — I10 Essential (primary) hypertension: Secondary | ICD-10-CM

## 2012-12-10 DIAGNOSIS — W19XXXA Unspecified fall, initial encounter: Secondary | ICD-10-CM

## 2012-12-10 DIAGNOSIS — S72309A Unspecified fracture of shaft of unspecified femur, initial encounter for closed fracture: Secondary | ICD-10-CM

## 2012-12-10 DIAGNOSIS — F341 Dysthymic disorder: Secondary | ICD-10-CM

## 2012-12-10 MED ORDER — WARFARIN SODIUM 3 MG PO TABS
3.0000 mg | ORAL_TABLET | ORAL | Status: DC
  Administered 2012-12-12: 3 mg via ORAL
  Filled 2012-12-10 (×2): qty 1

## 2012-12-10 NOTE — Progress Notes (Signed)
Subjective/Complaints: Improving activity tolerance. Pain tolerable. Still some safety issues per staff A 12 point review of systems has been performed and if not noted above is otherwise negative.   Objective: Vital Signs: Blood pressure 105/63, pulse 81, temperature 98.1 F (36.7 C), temperature source Oral, resp. rate 18, weight 71.668 kg (158 lb), SpO2 92.00%. No results found.  Recent Labs  12/08/12 0819  WBC 6.9  HGB 10.8*  HCT 32.9*  PLT 388   No results found for this basename: NA, K, CL, CO, GLUCOSE, BUN, CREATININE, CALCIUM,  in the last 72 hours CBG (last 3)  No results found for this basename: GLUCAP,  in the last 72 hours  Wt Readings from Last 3 Encounters:  12/04/12 71.668 kg (158 lb)  11/28/12 63.504 kg (140 lb)  11/28/12 63.504 kg (140 lb)    Physical Exam:  Constitutional: She is oriented to person, place, and time. She appears well-developed and well-nourished.  HENT:  Head: Normocephalic and atraumatic.  Right Ear: External ear normal.  Left Ear: External ear normal.  Eyes: Conjunctivae and EOM are normal. Pupils are equal, round, and reactive to light. Right eye exhibits no discharge. Left eye exhibits no discharge. No scleral icterus.  Neck: Normal range of motion. Neck supple. No JVD present. No tracheal deviation present. No thyromegaly present.  Cardiovascular: Normal rate and regular rhythm. Exam reveals no friction rub.  No murmur heard.  Pulmonary/Chest: Effort normal and breath sounds normal. No respiratory distress. She has no wheezes.  Abdominal: Soft. Bowel sounds are normal. She exhibits no distension and no mass. There is no tenderness. There is no rebound and no guarding.  Musculoskeletal:    Joint deformities in the hands.  Lymphadenopathy:  She has no cervical adenopathy.  Neurological: She is alert and oriented to person, place, and time. UE is 4/5. RLE is 2-3/5 (pain) proximally to 4-/5 distally. LLE is 4/5 proximally to distally.   Skin: No rash noted. No erythema.  Surgical site clean and dry with staples. Decreased Swelling noted with bruising around incision site. Right thigh is tender. Psychiatric: She has a normal mood and affect. Her behavior is normal. Judgment and thought content normal    Assessment/Plan: 1. Functional deficits secondary to right peri-prosthetic femoral shaft fx which require 3+ hours per day of interdisciplinary therapy in a comprehensive inpatient rehab setting. Physiatrist is providing close team supervision and 24 hour management of active medical problems listed below. Physiatrist and rehab team continue to assess barriers to discharge/monitor patient progress toward functional and medical goals. FIM: FIM - Bathing Bathing Steps Patient Completed: Chest;Right Arm;Left Arm;Abdomen;Front perineal area;Buttocks;Right upper leg;Left upper leg Bathing: 4: Min-Patient completes 8-9 46f 10 parts or 75+ percent  FIM - Upper Body Dressing/Undressing Upper body dressing/undressing steps patient completed: Hook/unhook bra Upper body dressing/undressing: 4: Min-Patient completed 75 plus % of tasks FIM - Lower Body Dressing/Undressing Lower body dressing/undressing steps patient completed: Thread/unthread right pants leg;Thread/unthread left pants leg;Pull pants up/down Lower body dressing/undressing:  (used a reacher to don pants)  FIM - Toileting Toileting steps completed by patient: Performs perineal hygiene Toileting Assistive Devices: Grab bar or rail for support Toileting: 2: Max-Patient completed 1 of 3 steps (needed assist with brief & hospital gown)  FIM - Archivist Transfers Assistive Devices: Elevated toilet seat;Grab bars;Walker Toilet Transfers: 4-To toilet/BSC: Min A (steadying Pt. > 75%) (steady assist)  FIM - Banker Devices: Walker;Bed rails Bed/Chair Transfer: 5: Supine > Sit: Supervision (verbal  cues/safety issues);4: Sit  > Supine: Min A (steadying pt. > 75%/lift 1 leg)  FIM - Locomotion: Wheelchair Distance: 40 Locomotion: Wheelchair: 2: Travels 50 - 149 ft with minimal assistance (Pt.>75%) FIM - Locomotion: Ambulation Locomotion: Ambulation Assistive Devices: Parallel bars Ambulation/Gait Assistance: 4: Min guard Locomotion: Ambulation: 2: Travels 50 - 149 ft with minimal assistance (Pt.>75%)  Comprehension Comprehension Mode: Auditory Comprehension: 5-Understands complex 90% of the time/Cues < 10% of the time  Expression Expression Mode: Verbal Expression: 6-Expresses complex ideas: With extra time/assistive device  Social Interaction Social Interaction: 5-Interacts appropriately 90% of the time - Needs monitoring or encouragement for participation or interaction.  Problem Solving Problem Solving: 5-Solves complex 90% of the time/cues < 10% of the time  Memory Memory: 5-Recognizes or recalls 90% of the time/requires cueing < 10% of the time  Medical Problem List and Plan:  1. Right periprosthetic femoral shaft fracture. Status post ORIF 12/01/2012.  2. DVT Prophylaxis/Anticoagulation: Chronic Coumadin therapy. INR 1.40. Subcutaneous Lovenox for INR greater than 2.00  3. Pain Management: Hydrocodone and Robaxin as needed.  -schedule tylenol for now  -will need to minimize narcotic use  -utilize local remedies for pain also 4. Mood: Xanax 1 mg every 4 hours as needed. Wellbutrin 300 mg daily, Zoloft 150 mg daily. Provide emotional support  -scheduled xanax qd  5. Neuropsych: This patient is capable of making decisions on her own behalf.  6. Hypertension. Norvasc 5 mg daily. Monitor for the effects of increased mobility and pain upon her BP.  7. Hyperlipidemia. Lipitor  8. 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 Urinary incontinence.   -is seen by urology as an outpt-- UDS was planned  -ua neg, culture negative  LOS (Days) 8 A FACE TO FACE EVALUATION WAS  PERFORMED  SWARTZ,ZACHARY T 12/10/2012 7:17 AM

## 2012-12-10 NOTE — Progress Notes (Signed)
Occupational Therapy Session Note  Patient Details  Name: Renee Adams MRN: [id number] Date of Birth: 1934/03/03  Today's Date: 12/10/2012 Time: 1300-1330 Time Calculation (min): 30 min  Short Term Goals: Week 1:  OT Short Term Goal 1 (Week 1): Transfer to BSC/ commode with mod A  OT Short Term Goal 2 (Week 1): Perform sit to stand with mod A OT Short Term Goal 3 (Week 1): Perform standing balance with mod A OT Short Term Goal 4 (Week 1): Perform clothing management with mod A for dressing and toileting  Skilled Therapeutic Interventions/Progress Updates:    1:1 focus on simple home making tasks in ADL apartment including obtaining items from dresser drawers, hanging clothes in closet, changing pillow cases and sitting on different furniture. Pt was able to maintain hip precautions with all activities. Pt engaged in d/c planning. Also practiced shower stall transfer (over a threshold) using a RW and entering the shower backwards.  Therapy Documentation Precautions:  Precautions Precautions: Posterior Hip;Fall Precaution Comments: Bilateral Posterior Hip precautions Restrictions Weight Bearing Restrictions: No RLE Weight Bearing: Weight bearing as tolerated Other Position/Activity Restrictions: OK for Hip ROM (within post prec), and no restrictions on knee ROM    Pain: Pain Assessment Pain Assessment: No/denies pain Pain Score: 0-No pain  See FIM for current functional status  Therapy/Group: Individual Therapy  Roney Mans [facility] 12/10/2012, 2:15 PM

## 2012-12-10 NOTE — Progress Notes (Signed)
ANTICOAGULATION CONSULT NOTE - Follow Up Consult  Pharmacy Consult:  Coumadin Indication: History ASD, TIA  Allergies  Allergen Reactions  . Dilaudid (Hydromorphone Hcl) Shortness Of Breath  . Morphine And Related Shortness Of Breath  . Sulfa Antibiotics Other (See Comments)    "Made me drunk";  wobbly  . Keflex (Cephalexin) Nausea Only  . Levaquin (Levofloxacin) Hives and Rash    Patient Measurements: Weight: 158 lb (71.668 kg)   Vital Signs: Temp: 98.1 F (36.7 C) (06/04 0554) Temp src: Oral (06/04 0554) BP: 105/63 mmHg (06/04 0554) Pulse Rate: 81 (06/04 0554)  Labs:  Recent Labs  12/08/12 0555 12/08/12 0819 12/10/12 0535  HGB  --  10.8*  --   HCT  --  32.9*  --   PLT  --  388  --   LABPROT 27.1*  --  31.2*  INR 2.67*  --  3.23*    The CrCl is unknown because both a height and weight (above a minimum accepted value) are required for this calculation.    Assessment: 79yo female with history of ASD and TIA resumed on Coumadin s/p repair of femur fracture.  INR (3.23) supratherapeutic; No new cbc since 6/2, no bleeding reported.  Home Coumadin: alternating 3mg  and 2 mg PO every other day  Goal of Therapy:  INR 2-3 Monitor platelets by anticoagulation protocol: Yes    Plan:  - Hold coumadin dose today - f/u INR tomorrow AM  Bayard Hugger, PharmD, BCPS  Clinical Pharmacist  Pager: [phone]   12/10/2012, 8:26 AM

## 2012-12-10 NOTE — Progress Notes (Signed)
Physical Therapy Session Note  Patient Details  Name: Renee Adams MRN: [id number] Date of Birth: Feb 06, 1934  Today's Date: 12/10/2012 Time: 1100-1156 Time Calculation (min): 56 min  Short Term Goals: Week 2:  PT Short Term Goal 1 (Week 2): Pt will ambulate x 50' with min assist PT Short Term Goal 2 (Week 2): Pt will perform transfers sit <> stand with supervision PT Short Term Goal 3 (Week 2): Pt will verbalize 3/3 hip precautions with no verbal cues  Skilled Therapeutic Interventions/Progress Updates:   Initially worked on bed mobility transfer OOB with overall close S to steady; cues for technique intermittently. W/c propulsion down to therapy gym with S for endurance and strengthening. Dynamic gait through obstacle course navigating turns and obstacles with close S 25' x 2; improved step length and cadence noted. Toe taps to prepare for stair negotiation using RW and 4" step x 5 reps each side with steady A. Progressed to curb step with RW up/down 2 reps repeated twice with min A; pt able to verbalize which foot to lead up/down with without cues needed initially but last time pt required cues. Standing therex for functional strengthening including hip abduction, hamstring curls, LAQ, and mini squats x 10 reps x 2 sets each.   Therapy Documentation Precautions:  Precautions Precautions: Posterior Hip;Fall Precaution Comments: Bilateral Posterior Hip precautions Restrictions Weight Bearing Restrictions: No RLE Weight Bearing: Weight bearing as tolerated Other Position/Activity Restrictions: OK for Hip ROM (within post prec), and no restrictions on knee ROM Pain: Pain Assessment Pain Assessment: No/denies pain Pain Score: 0-No pain Premedicated.  See FIM for current functional status  Therapy/Group: Individual Therapy  Karolee Stamps [facility] 12/10/2012, 11:19 AM

## 2012-12-10 NOTE — Progress Notes (Signed)
Occupational Therapy Session Note  Patient Details  Name: Renee Adams MRN: [id number] Date of Birth: [birth year]  Today's Date: 12/10/2012 Time: 0900-1000 Time Calculation (min):  Short Term Goals: Week 1:  OT Short Term Goal 1 (Week 1): Transfer to BSC/ commode with mod A  OT Short Term Goal 2 (Week 1): Perform sit to stand with mod A OT Short Term Goal 3 (Week 1): Perform standing balance with mod A OT Short Term Goal 4 (Week 1): Perform clothing management with mod A for dressing and toileting  Skilled Therapeutic Interventions/Progress Updates:    Pt in bed upon arrival and stated she wanted to bathe at sink this morning.  Pt requested to use toilet and amb with RW to bathroom with supervision.  Pt completed required assistance to bathe feet without AE and don socks/shoes without AE.  Pt stood at sink to bathe buttocks and to complete all grooming tasks.  Pt requested to return to bed at end of session.  Focus on activity tolerance, functional ambulation, dynamic standing balance, and safety awareness.  Pt stated 3/3 hip precautions.  Therapy Documentation Precautions:  Precautions Precautions: Posterior Hip;Fall Precaution Comments: Bilateral Posterior Hip precautions Restrictions Weight Bearing Restrictions: No RLE Weight Bearing: Weight bearing as tolerated Other Position/Activity Restrictions: OK for Hip ROM (within post prec), and no restrictions on knee ROM General:   Vital Signs: Therapy Vitals Temp: 98.2 F (36.8 C) Temp src: Oral Pulse Rate: 83 Resp: 18 BP: 102/66 mmHg Patient Position, if appropriate: Sitting Oxygen Therapy SpO2: 93 % O2 Device: None (Room air) Pain: Pain Assessment Pain Assessment: No/denies pain Pain Score: 0-No pain  See FIM for current functional status  Therapy/Group: Individual Therapy  Rich Brave 12/10/2012, 3:27 PM

## 2012-12-10 NOTE — Progress Notes (Signed)
Social Work Patient ID: Renee Adams, female   DOB: [birth year], 79 y.o.   MRN: [id number]  Have reviewed team conference today with patient and son (via phone) with both agreed with targeted d/c date of 6/10 with supervision goals overall.  Son reports that his sister does plan to stay with after d/c, however, he is unsure for how long.  He asked that I contact her - I have left two messages for her and am await contact to address if any concerns or questions about her ability to assist.  Continue to follow.  [name], LCSW

## 2012-12-10 NOTE — Progress Notes (Signed)
Physical Therapy Note  Patient Details  Name: Renee Adams MRN: [id number] Date of Birth: April 11, 1934 Today's Date: 12/10/2012  1400-1440 (40 minutes) individual Pain: no reported pain Focus of treatment : Therapeutic exercise focused on bilateral LE AROM/strengthening Treatment: wc setup- pt requires max vcs to lock brakes and assist with footrests; transfer stand/turn RW SBA ; sit >< supine (mat) SBA; therapeutic exercises X 20 - ankle pumps, heel slides (with difficulty RT LE) , hip abduction, SAQs, sit >< stand X 5 SBA ; wc mobility 120 feet SBA with increased time for endurance.     Renee Adams,JIM 12/10/2012, 2:16 PM

## 2012-12-11 ENCOUNTER — Inpatient Hospital Stay (HOSPITAL_COMMUNITY): Payer: Medicare Other

## 2012-12-11 ENCOUNTER — Inpatient Hospital Stay (HOSPITAL_COMMUNITY): Payer: Medicare Other | Admitting: Physical Therapy

## 2012-12-11 LAB — PROTIME-INR: Prothrombin Time: 27.1 seconds — ABNORMAL HIGH (ref 11.6–15.2)

## 2012-12-11 NOTE — Plan of Care (Signed)
Problem: RH SAFETY Goal: RH STG ADHERE TO SAFETY PRECAUTIONS W/ASSISTANCE/DEVICE STG Adhere to Safety Precautions With Assistance/Device. Supervision  Outcome: Progressing No unsafe behavior noted. Pt calls appropriately.

## 2012-12-11 NOTE — Progress Notes (Signed)
Occupational Therapy Session Note  Patient Details  Name: Renee Adams MRN: [id number] Date of Birth: 1934/05/19  Today's Date: 12/11/2012  Session 1 Time: 0800-0855 Time Calculation (min): 55 min  Short Term Goals: Week 2:  OT Short Term Goal 1 (Week 2):  (STG=LTG secondary to ELOS)  Skilled Therapeutic Interventions/Progress Updates:    Pt seated EOB eating breakfast upon arrival.  Pt initially requested to lie back down and "do therapy" later.  Pt stated her leg was hurting more today than yesterday.  I explained to patient that unfortunately I wasn't able to rearrange her schedule this morning.  Pt stated she would go ahead and change clothes.  After selecting clothing patient decided to wash up at sink.  Pt completed bathing and dressing sit<>stand at sink using AE appropriately to assist with LB dressing.  Pt requested to return to bed at end of session.  Focus on activity tolerance, dynamic standing balance, transfers, and safety awareness.  Therapy Documentation Precautions:  Precautions Precautions: Posterior Hip;Fall Precaution Comments: Bilateral Posterior Hip precautions Restrictions Weight Bearing Restrictions: No RLE Weight Bearing: Weight bearing as tolerated Other Position/Activity Restrictions: OK for Hip ROM (within post prec), and no restrictions on knee ROM Pain: Pain Assessment Pain Assessment: 0-10 Pain Score:   4 Pain Type: Acute pain Pain Location: Leg Pain Orientation: Right;Upper;Anterior RN aware; repositioned  See FIM for current functional status  Therapy/Group: Individual Therapy  Session 2 Time: 1300-1330 Pt c/o 4/10 pain; right upper leg; RN aware and repositionedIndividual therapy  Pt seated in w/c upon arrival and stated that she was still sore from morning therapy and didn't think she could do any walking.  Pt agreeable to going to gym for BUE therex on UE ergometer to increase endurance.  Pt agreed to ambulate approx 20' from gym door to  exercise machine and again at end of session back to w/c.  Pt completed 3 reps X 3 min at level 3 on ergometer.  Lavone Neri [facility] 12/11/2012, 8:57 AM

## 2012-12-11 NOTE — Progress Notes (Signed)
Social Work Patient ID: Renee Adams, female   DOB: 07-29-1933, 79 y.o.   MRN: [id number]  Spoke with pt's daughter today about d/c plans.  Daughter confirms that she will be here on Tuesday for d/c and plans to stay with pt at least a couple of weeks.  She has other family (74 yo daughter and spouse) who can also stay if longer time is needed.  Have planned family ed for day d/c 11-12.  No other concerns/ questions.  Kimra Kantor, LCSW

## 2012-12-11 NOTE — Progress Notes (Signed)
Occupational Therapy Weekly Progress Note  Patient Details  Name: Renee Adams MRN: [id number] Date of Birth: 1933-11-14  Today's Date: 12/11/2012  Patient has met 4 of 4 short term goals. Pt has made steady progress over the past few days with bathing, dressing, toilet transfers, shower transfers, and toileting.  Pt is supervision for toilet transfers, toileting, and UB dressing and min A for bathing, and mod A for LB dressing.  Pt can state 3/3 hip precautions and uses AE appropriately for LB dressing and bathing.    Patient continues to demonstrate the following deficits: decreased activity tolerance/endurance, decreased independence with BADLs & IADLs, decreased independence with functional mobility, decreased independence with transfers. Therefore, patient will continue to benefit from skilled OT intervention to enhance overall performance with BADL, iADL and Reduce care partner burden.  Patient progressing toward long term goals..  Continue plan of care.  OT Short Term Goals Week 1:  OT Short Term Goal 1 (Week 1): Transfer to BSC/ commode with mod A  OT Short Term Goal 1 - Progress (Week 1): Met OT Short Term Goal 2 (Week 1): Perform sit to stand with mod A OT Short Term Goal 2 - Progress (Week 1): Met OT Short Term Goal 3 (Week 1): Perform standing balance with mod A OT Short Term Goal 3 - Progress (Week 1): Met OT Short Term Goal 4 (Week 1): Perform clothing management with mod A for dressing and toileting OT Short Term Goal 4 - Progress (Week 1): Met  Week 2:    (STG=LTG secondary to ELOS)  Skilled Therapeutic Interventions/Progress Updates:  Balance/vestibular training;Cognitive remediation/compensation;Discharge planning;Community reintegration;DME/adaptive equipment instruction;Functional mobility training;Pain management;Patient/family education;Self Care/advanced ADL retraining;Therapeutic Activities;UE/LE Strength taining/ROM;UE/LE Coordination activities;Therapeutic  Exercise;Neuromuscular re-education;Psychosocial support;Skin care/wound managment;Wheelchair propulsion/positioning   Precautions:  Precautions Precautions: Posterior Hip;Fall Precaution Comments: Bilateral Posterior Hip precautions Restrictions Weight Bearing Restrictions: No RLE Weight Bearing: Weight bearing as tolerated Other Position/Activity Restrictions: OK for Hip ROM (within post prec), and no restrictions on knee ROM  See FIM for current functional status  Therapy/Group: Individual Therapy  Aliyha Fornes 12/11/2012, 12:30 PM

## 2012-12-11 NOTE — Progress Notes (Signed)
ANTICOAGULATION CONSULT NOTE - Follow Up Consult  Pharmacy Consult:  Coumadin Indication: History ASD, TIA  Allergies  Allergen Reactions  . Dilaudid (Hydromorphone Hcl) Shortness Of Breath  . Morphine And Related Shortness Of Breath  . Sulfa Antibiotics Other (See Comments)    "Made me drunk";  wobbly  . Keflex (Cephalexin) Nausea Only  . Levaquin (Levofloxacin) Hives and Rash    Patient Measurements: Weight: 152 lb 6.4 oz (69.128 kg)   Vital Signs: Temp: 98.5 F (36.9 C) (06/05 0529) Temp src: Oral (06/05 0529) BP: 110/60 mmHg (06/05 0935) Pulse Rate: 92 (06/05 0529)  Labs:  Recent Labs  12/10/12 0535 12/11/12 0530  LABPROT 31.2* 27.1*  INR 3.23* 2.67*    The CrCl is unknown because both a height and weight (above a minimum accepted value) are required for this calculation.    Assessment: 79yo female with history of ASD and TIA resumed on Coumadin s/p repair of femur fracture.  INR (2.67) back down to therapeutic range after holding coumadin yesterday; No new cbc since 6/2, no bleeding reported.  Home Coumadin: alternating 3mg  and 2 mg PO every other day  Goal of Therapy:  INR 2-3 Monitor platelets by anticoagulation protocol: Yes    Plan:  - Continue current scheduled coumadin dose, with 2mg  tonight - f/u INR tomorrow AM  Bayard Hugger, PharmD, BCPS  Clinical Pharmacist  Pager: [phone]   12/11/2012, 10:39 AM

## 2012-12-11 NOTE — Plan of Care (Signed)
Problem: RH SKIN INTEGRITY Goal: RH STG MAINTAIN SKIN INTEGRITY WITH ASSISTANCE STG Maintain Skin Integrity With Min Assistance.  Outcome: Progressing Surgical incision healing appropriately.

## 2012-12-11 NOTE — Plan of Care (Signed)
Problem: RH PAIN MANAGEMENT Goal: RH STG PAIN MANAGED AT OR BELOW PT'S PAIN GOAL Pt will rate pain less than 3 on scale of 0-10 with min assist  Outcome: Progressing Rates pain between 2-4

## 2012-12-11 NOTE — Progress Notes (Signed)
Physical Therapy Session Note  Patient Details  Name: Renee Adams MRN: [id number] Date of Birth: 1934/01/01  Today's Date: 12/11/2012 Time: 1445-1530 Time Calculation (min): 45 min  Short Term Goals: Week 1:  PT Short Term Goal 1 (Week 1): Pt will perform functional transfers with min A PT Short Term Goal 1 - Progress (Week 1): Met PT Short Term Goal 2 (Week 1): Pt will gait in controlled environment with min A 50' PT Short Term Goal 2 - Progress (Week 1): Not met  Skilled Therapeutic Interventions/Progress Updates:   Education provided to pt on reason for feeling more "sore" today in regards to increased activity/use of muscles in RLE and pt verbalized understanding. Focused on functional strengthening seated edge of mat, basic transfers with S, standing tolerance, dynamic balance for alternating LE's to kick ball using RW for support (S to light steady A) x 10 reps x 2 sets each LE, and bed mobility to return to bed end of session (and ice applied to R hip).Though pt reporting increased soreness and pain today, able to work through it and completed session.  Therapy Documentation Precautions:  Precautions Precautions: Posterior Hip;Fall Precaution Comments: Bilateral Posterior Hip precautions Restrictions Weight Bearing Restrictions: No RLE Weight Bearing: Weight bearing as tolerated Other Position/Activity Restrictions: OK for Hip ROM (within post prec), and no restrictions on knee ROM  Pain: Reports increased soreness in RLE today; ice applied at end of session as pain medication not due yet.    See FIM for current functional status  Therapy/Group: Individual Therapy  Karolee Stamps [facility] 12/11/2012, 4:10 PM

## 2012-12-11 NOTE — Progress Notes (Signed)
Subjective/Complaints: Right hip was more painful yesterday after therapy. Feels better this am. Pain more in inguinal area A 12 point review of systems has been performed and if not noted above is otherwise negative.   Objective: Vital Signs: Blood pressure 112/66, pulse 92, temperature 98.5 F (36.9 C), temperature source Oral, resp. rate 17, weight 69.128 kg (152 lb 6.4 oz), SpO2 92.00%. No results found.  Recent Labs  12/08/12 0819  WBC 6.9  HGB 10.8*  HCT 32.9*  PLT 388   No results found for this basename: NA, K, CL, CO, GLUCOSE, BUN, CREATININE, CALCIUM,  in the last 72 hours CBG (last 3)  No results found for this basename: GLUCAP,  in the last 72 hours  Wt Readings from Last 3 Encounters:  12/10/12 69.128 kg (152 lb 6.4 oz)  11/28/12 63.504 kg (140 lb)  11/28/12 63.504 kg (140 lb)    Physical Exam:  Constitutional: She is oriented to person, place, and time. She appears well-developed and well-nourished.  HENT:  Head: Normocephalic and atraumatic.  Right Ear: External ear normal.  Left Ear: External ear normal.  Eyes: Conjunctivae and EOM are normal. Pupils are equal, round, and reactive to light. Right eye exhibits no discharge. Left eye exhibits no discharge. No scleral icterus.  Neck: Normal range of motion. Neck supple. No JVD present. No tracheal deviation present. No thyromegaly present.  Cardiovascular: Normal rate and regular rhythm. Exam reveals no friction rub.  No murmur heard.  Pulmonary/Chest: Effort normal and breath sounds normal. No respiratory distress. She has no wheezes.  Abdominal: Soft. Bowel sounds are normal. She exhibits no distension and no mass. There is no tenderness. There is no rebound and no guarding.  Musculoskeletal:    Joint deformities in the hands.  Lymphadenopathy:  She has no cervical adenopathy.  Neurological: She is alert and oriented to person, place, and time. UE is 4/5. RLE is 2-3/5 (pain) proximally to 4-/5 distally. LLE  is 4/5 proximally to distally.  Skin: No rash noted. No erythema.  Surgical site clean and dry with staples. Decreased Swelling noted with bruising around incision site. Right thigh is tender. Psychiatric: She has a normal mood and affect. Her behavior is normal. Judgment and thought content normal    Assessment/Plan: 1. Functional deficits secondary to right peri-prosthetic femoral shaft fx which require 3+ hours per day of interdisciplinary therapy in a comprehensive inpatient rehab setting. Physiatrist is providing close team supervision and 24 hour management of active medical problems listed below. Physiatrist and rehab team continue to assess barriers to discharge/monitor patient progress toward functional and medical goals. FIM: FIM - Bathing Bathing Steps Patient Completed: Chest;Right Arm;Left Arm;Abdomen;Front perineal area;Buttocks;Right upper leg;Left upper leg Bathing: 4: Min-Patient completes 8-9 11f 10 parts or 75+ percent  FIM - Upper Body Dressing/Undressing Upper body dressing/undressing steps patient completed: Thread/unthread right bra strap;Thread/unthread left bra strap;Hook/unhook bra;Thread/unthread right sleeve of pullover shirt/dresss;Thread/unthread left sleeve of pullover shirt/dress;Put head through opening of pull over shirt/dress;Pull shirt over trunk Upper body dressing/undressing: 5: Supervision: Safety issues/verbal cues FIM - Lower Body Dressing/Undressing Lower body dressing/undressing steps patient completed: Thread/unthread right pants leg;Thread/unthread left pants leg;Pull pants up/down Lower body dressing/undressing: 3: Mod-Patient completed 50-74% of tasks  FIM - Toileting Toileting steps completed by patient: Adjust clothing prior to toileting;Performs perineal hygiene;Adjust clothing after toileting Toileting Assistive Devices: Grab bar or rail for support Toileting: 4: Steadying assist  FIM - Diplomatic Services operational officer Devices:  Environmental consultant;Bedside commode Toilet Transfers: 5-To toilet/BSC:  Supervision (verbal cues/safety issues);5-From toilet/BSC: Supervision (verbal cues/safety issues)  FIM - Banker Devices: Walker;Bed rails Bed/Chair Transfer: 5: Supine > Sit: Supervision (verbal cues/safety issues);5: Sit > Supine: Supervision (verbal cues/safety issues);4: Bed > Chair or W/C: Min A (steadying Pt. > 75%);4: Chair or W/C > Bed: Min A (steadying Pt. > 75%)  FIM - Locomotion: Wheelchair Distance: 40 Locomotion: Wheelchair: 2: Travels 50 - 149 ft with supervision, cueing or coaxing FIM - Locomotion: Ambulation Locomotion: Ambulation Assistive Devices: Designer, industrial/product Ambulation/Gait Assistance: 4: Min guard Locomotion: Ambulation: 2: Travels 50 - 149 ft with minimal assistance (Pt.>75%)  Comprehension Comprehension Mode: Auditory Comprehension: 5-Understands complex 90% of the time/Cues < 10% of the time  Expression Expression Mode: Verbal Expression: 6-Expresses complex ideas: With extra time/assistive device  Social Interaction Social Interaction: 5-Interacts appropriately 90% of the time - Needs monitoring or encouragement for participation or interaction.  Problem Solving Problem Solving: 5-Solves complex 90% of the time/cues < 10% of the time  Memory Memory: 5-Recognizes or recalls 90% of the time/requires cueing < 10% of the time  Medical Problem List and Plan:  1. Right periprosthetic femoral shaft fracture. Status post ORIF 12/01/2012.  2. DVT Prophylaxis/Anticoagulation: Chronic Coumadin therapy. INR therapeutic. 3. Pain Management: Hydrocodone and Robaxin as needed.  -schedule tylenol for now  -will need to minimize narcotic use  -utilize local remedies for pain also including heat and ice which we reviewed today  -if hip continues to increase in pain intensity today, we'll check xrays---likely spasms though last night 4. Mood: Xanax 1 mg every 4 hours as  needed. Wellbutrin 300 mg daily, Zoloft 150 mg daily. Provide emotional support  -scheduled xanax qd  5. Neuropsych: This patient is capable of making decisions on her own behalf.  6. Hypertension. Norvasc 5 mg daily. Monitor for the effects of increased mobility and pain upon her BP.  7. Hyperlipidemia. Lipitor  8. 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 Urinary incontinence.   -is seen by urology as an outpt-- UDS was planned  -ua neg, culture negative  LOS (Days) 9 A FACE TO FACE EVALUATION WAS PERFORMED  Giannis Corpuz T 12/11/2012 7:33 AM

## 2012-12-11 NOTE — Progress Notes (Signed)
Physical Therapy Session Note  Patient Details  Name: Renee Adams MRN: [id number] Date of Birth: [birth year]  Today's Date: 12/11/2012 Time: 1100-1200 Time Calculation (min): 60 min  Short Term Goals: Week 2:  PT Short Term Goal 1 (Week 2): Pt will ambulate x 50' with min assist PT Short Term Goal 2 (Week 2): Pt will perform transfers sit <> stand with supervision PT Short Term Goal 3 (Week 2): Pt will verbalize 3/3 hip precautions with no verbal cues  Skilled Therapeutic Interventions/Progress Updates:    Pt reports varying pain score depending upon activity, at rest 2-3/10 and with WB or mobility increases to 4-5/10, states she was very sore last night and had some difficulty sleeping per pt report pt did receive pain medications from nursing with some relief. Pt performed supine to sit with EOB elevated with supervision assist and requires increased time, mat table supine to sit mod assist and requires assistance with R LE.  Toilet transfer with supervision assist and sit to stand from w/c level with supervision assist, pt requires increased assistance from lower surfaces. Pt demonstrates pain behaviors of grimacing and moaning with some mobility > with supine to sit from mat table. Pt performed there ex with physical assist from PTA to facilitate and to perform with correct technique for knee presses x 10 , heel slides x 10 and SLR 5 reps x 3 with pt able to initiate increased quad control after 2 reps and therapist assist, able to raise R LE approximately 3-4 inches off the mat table, seen for gait training with RW with supervision assist WBAT 50 feet x 3 with intermittent vcs to improve upright posture and gait pattern, wheelchair mobility with use of B UE and B LE with supervision assist x 30 feet with pt reporting slight fatigue. Pt very motivated and pleasant and displays steady progress towards stated goals.  Therapy Documentation Precautions:  Precautions Precautions: Posterior  Hip;Fall Precaution Comments: Bilateral Posterior Hip precautions Restrictions Weight Bearing Restrictions: No RLE Weight Bearing: Weight bearing as tolerated Other Position/Activity Restrictions: OK for Hip ROM (within post prec), and no restrictions on knee ROM       Pain: Pain Assessment Pain Score:  2- 4/10 varies Pain Type: Surgical pain Pain Location: Leg Pain Orientation: Right Pain Descriptors / Indicators: Sore (Recent therapy session) Pain Frequency: Intermittent Pain Onset: With Activity Pain Intervention(s): Medication (See eMAR)    Locomotion : Ambulation Ambulation/Gait Assistance: 4: Min guard;5: Supervision Wheelchair Mobility Distance: 30 feet             See FIM for current functional status  Therapy/Group: Individual Therapy  Jackelyn Knife 12/11/2012, 3:34 PM

## 2012-12-12 ENCOUNTER — Inpatient Hospital Stay (HOSPITAL_COMMUNITY): Payer: Medicare Other | Admitting: Physical Therapy

## 2012-12-12 ENCOUNTER — Inpatient Hospital Stay (HOSPITAL_COMMUNITY): Payer: Medicare Other | Admitting: Occupational Therapy

## 2012-12-12 ENCOUNTER — Inpatient Hospital Stay (HOSPITAL_COMMUNITY): Payer: Medicare Other

## 2012-12-12 DIAGNOSIS — W19XXXA Unspecified fall, initial encounter: Secondary | ICD-10-CM

## 2012-12-12 DIAGNOSIS — S72009A Fracture of unspecified part of neck of unspecified femur, initial encounter for closed fracture: Secondary | ICD-10-CM | POA: Diagnosis not present

## 2012-12-12 DIAGNOSIS — I10 Essential (primary) hypertension: Secondary | ICD-10-CM

## 2012-12-12 DIAGNOSIS — S72309A Unspecified fracture of shaft of unspecified femur, initial encounter for closed fracture: Secondary | ICD-10-CM

## 2012-12-12 DIAGNOSIS — F341 Dysthymic disorder: Secondary | ICD-10-CM

## 2012-12-12 LAB — PROTIME-INR: INR: 1.83 — ABNORMAL HIGH (ref 0.00–1.49)

## 2012-12-12 MED ORDER — TRAMADOL HCL 50 MG PO TABS
50.0000 mg | ORAL_TABLET | Freq: Every day | ORAL | Status: DC
  Administered 2012-12-12: 50 mg via ORAL
  Filled 2012-12-12 (×2): qty 1

## 2012-12-12 NOTE — Progress Notes (Signed)
ANTICOAGULATION CONSULT NOTE - Follow Up Consult  Pharmacy Consult:  Coumadin Indication: History ASD, TIA  Allergies  Allergen Reactions  . Dilaudid (Hydromorphone Hcl) Shortness Of Breath    Tolerates tramadol  . Morphine And Related Shortness Of Breath    Tolerates tramadol  . Sulfa Antibiotics Other (See Comments)    "Made me drunk";  wobbly  . Keflex (Cephalexin) Nausea Only  . Levaquin (Levofloxacin) Hives and Rash    Patient Measurements: Weight: 152 lb 6.4 oz (69.128 kg)   Vital Signs: Temp: 98.5 F (36.9 C) (06/06 0500) Temp src: Oral (06/06 0500) BP: 122/75 mmHg (06/06 1025) Pulse Rate: 80 (06/06 0500)  Labs:  Recent Labs  12/10/12 0535 12/11/12 0530 12/12/12 0615  LABPROT 31.2* 27.1* 20.5*  INR 3.23* 2.67* 1.83*    The CrCl is unknown because both a height and weight (above a minimum accepted value) are required for this calculation.    Assessment: 79yo female with history of ASD and TIA resumed on Coumadin s/p repair of femur fracture.  INR (1.83) trending down and slightly subtherapeutic today; No new cbc since 6/2, no bleeding reported.  Home Coumadin: alternating 3mg  and 2 mg PO every other day  Goal of Therapy:  INR 2-3 Monitor platelets by anticoagulation protocol: Yes    Plan:  - Continue current scheduled coumadin dose, with 3mg  tonight - Check INR tomorrow Am, if back to therapeutic range, continue INR MWF  Bayard Hugger, PharmD, BCPS  Clinical Pharmacist  Pager: [phone]   12/12/2012, 11:20 AM

## 2012-12-12 NOTE — Progress Notes (Signed)
Return call from orthopedic services in regards to latest film x-rays of the hip of Renee Adams. Plans currently to resume therapies and maintain touchdown weightbearing with followup plan per orthopedic services no surgical intervention at this time

## 2012-12-12 NOTE — Progress Notes (Signed)
Occupational Therapy Session Note  Patient Details  Name: Renee Adams MRN: [id number] Date of Birth: 11/14/1933  Today's Date: 12/12/2012  Short Term Goals: Week 1:  OT Short Term Goal 1 (Week 1): Transfer to BSC/ commode with mod A  OT Short Term Goal 1 - Progress (Week 1): Met OT Short Term Goal 2 (Week 1): Perform sit to stand with mod A OT Short Term Goal 2 - Progress (Week 1): Met OT Short Term Goal 3 (Week 1): Perform standing balance with mod A OT Short Term Goal 3 - Progress (Week 1): Met OT Short Term Goal 4 (Week 1): Perform clothing management with mod A for dressing and toileting OT Short Term Goal 4 - Progress (Week 1): Met Week 2:  OT Short Term Goal 1 (Week 2):  (STG=LTG secondary to ELOS)  Skilled Therapeutic Interventions/Progress Updates:  Session Note: Time: 1345-1430 (45 mins)  Pt with no report of pain.  Upon entering room, pt supine in bed asleep. Pt sit->stand with R/W for covering dressing. Pt ambulated->BR using R/W and completed toileting task (BSC over raised toilet). Next pt completed bathing task from tub bench (walk-in shower) using AE with sit->stand for peri care. OT assisted in LB dressing while seated and fastening of bra. Pt ambulated->room where OT donned pants d/t decreased time. Pt recalled 2/3 hip precautions independently and needed min v.c.'s for recalling third precaution. Pt needed min v.c's for adhering to hip precautions during fxal tasks. Pt reported she felt sleepy throughout entire tx session. At end of tx session, pt seated in w/c with PT present for next tx session.  Therapy Documentation Precautions:  Precautions Precautions: Posterior Hip;Fall Precaution Comments: Bilateral Posterior Hip precautions Restrictions Weight Bearing Restrictions: No RLE Weight Bearing: Weight bearing as tolerated Other Position/Activity Restrictions: OK for Hip ROM (within post prec), and no restrictions on knee ROM  See FIM for current functional  status  Therapy/Group: Individual Therapy  Rosangelica Pevehouse 12/12/2012, 7:26 AM

## 2012-12-12 NOTE — Progress Notes (Signed)
Physical Therapy Session Note  Patient Details  Name: Renee Adams MRN: [id number] Date of Birth: 12/20/1933  Today's Date: 12/12/2012 Time: 1430-1500 30 minutes   Short Term Goals: Week 1:  PT Short Term Goal 1 (Week 1): Pt will perform functional transfers with min A PT Short Term Goal 1 - Progress (Week 1): Met PT Short Term Goal 2 (Week 1): Pt will gait in controlled environment with min A 50' PT Short Term Goal 2 - Progress (Week 1): Not met   Therapy Documentation Precautions:  Precautions Precautions: Posterior Hip;Fall Precaution Comments: Bilateral Posterior Hip precautions Restrictions Weight Bearing Restrictions: Yes RLE Weight Bearing: Non weight bearing Other Position/Activity Restrictions: OK for Hip ROM (within post prec), and no restrictions on knee ROM General: Session focused on cardiovascular endurance and gait training. The pt was able to propel w/c 118 feet using UE's for propulsion with supervision level of assist. The pt was able to perform gait training with an obsetical course that focused on threshold step overs and 4 inch platform step with min A. The pt requires minimal VC's for stair negotiation for leading with the unaffected limb and coming down with the affected limb. The pt completed the 25 foot obstacle course four times with one seated rest break required after completing the course 2x. The patient reported a 2/10 pain in the groin area at the start of the session and denied pain medication.       Locomotion : Ambulation Ambulation/Gait Assistance: 5: Supervision Wheelchair Mobility Distance: 118   See FIM for current functional status  Therapy/Group: Individual Therapy  [place], Rackley 12/12/2012, 3:48 PM

## 2012-12-12 NOTE — Progress Notes (Signed)
Physical Therapy Session Note  Patient Details  Name: Renee Adams MRN: [id number] Date of Birth: Nov 30, 1933  Today's Date: 12/12/2012 Time: 0800-0900 Time Calculation (min): 60 min  Short Term Goals: Week 2:  PT Short Term Goal 1 (Week 2): Pt will ambulate x 50' with min assist PT Short Term Goal 2 (Week 2): Pt will perform transfers sit <> stand with supervision PT Short Term Goal 3 (Week 2): Pt will verbalize 3/3 hip precautions with no verbal cues  Skilled Therapeutic Interventions/Progress Updates:    Pt seen for gait training with RW 70 feet and 35 feet x 2 with SBA with intermittent vcs to improve upright posture and gait pattern, standing balance with weight shifting for reaching activities with pt demonstrating mild pain behaviors of grimacing with increased WB to R LE, standing there ex at walker for B HIp flexion, mini squats and heel raises x 20 reps, seated ther ex  Including pillow squeezes and LAQs x 20 reps,  Toe touch partial step ups with R LE to improve hip flexion. Pt unable to tolerate with L LE due to decreased single leg stance with R LE, pt was however to tolerate B LE marching in place. Wheelchair mobility 40 feet with supervision assist, pt demonstrates good carryover with wheelchair mobility. Pt continues with steady progress towards all stated goals and continues to be very motivated.    Therapy Documentation Precautions:  Precautions Precautions: Posterior Hip;Fall Precaution Comments: Bilateral Posterior Hip precautions Restrictions Weight Bearing Restrictions: Yes RLE Weight Bearing: Non weight bearing Other Position/Activity Restrictions: OK for Hip ROM (within post prec), and no restrictions on knee ROM       Pain: Pt reports 1-2/10 R LE at rest increases to 3-4/10 with mobility and WB.      Locomotion : Ambulation Ambulation/Gait Assistance: 5: Supervision Wheelchair Mobility Distance: 40 feet             See FIM for current functional  status  Therapy/Group: Individual Therapy  Jackelyn Knife 12/12/2012, 3:24 PM

## 2012-12-12 NOTE — Progress Notes (Signed)
Note reviewed and accurately reflects treatment session.   

## 2012-12-12 NOTE — Progress Notes (Signed)
Subjective/Complaints: Right hip is "substantially" more sore than it had been a couple days ago.---more in anterior thigh/inguinal area A 12 point review of systems has been performed and if not noted above is otherwise negative.   Objective: Vital Signs: Blood pressure 113/70, pulse 80, temperature 98.5 F (36.9 C), temperature source Oral, resp. rate 19, weight 69.128 kg (152 lb 6.4 oz), SpO2 98.00%. No results found. No results found for this basename: WBC, HGB, HCT, PLT,  in the last 72 hours No results found for this basename: NA, K, CL, CO, GLUCOSE, BUN, CREATININE, CALCIUM,  in the last 72 hours CBG (last 3)  No results found for this basename: GLUCAP,  in the last 72 hours  Wt Readings from Last 3 Encounters:  12/10/12 69.128 kg (152 lb 6.4 oz)  11/28/12 63.504 kg (140 lb)  11/28/12 63.504 kg (140 lb)    Physical Exam:  Constitutional: She is oriented to person, place, and time. She appears well-developed and well-nourished.  HENT:  Head: Normocephalic and atraumatic.  Right Ear: External ear normal.  Left Ear: External ear normal.  Eyes: Conjunctivae and EOM are normal. Pupils are equal, round, and reactive to light. Right eye exhibits no discharge. Left eye exhibits no discharge. No scleral icterus.  Neck: Normal range of motion. Neck supple. No JVD present. No tracheal deviation present. No thyromegaly present.  Cardiovascular: Normal rate and regular rhythm. Exam reveals no friction rub.  No murmur heard.  Pulmonary/Chest: Effort normal and breath sounds normal. No respiratory distress. She has no wheezes.  Abdominal: Soft. Bowel sounds are normal. She exhibits no distension and no mass. There is no tenderness. There is no rebound and no guarding.  Musculoskeletal:    Joint deformities in the hands.  Lymphadenopathy:  She has no cervical adenopathy.  Neurological: She is alert and oriented to person, place, and time. UE is 4/5. RLE is 2-3/5 (pain) proximally to 4-/5  distally. LLE is 4/5 proximally to distally.  Skin: No rash noted. No erythema.  Surgical site clean and dry with staples. Decreased bruising and swelling around incision site. Right thigh is tender. Psychiatric: She has a normal mood and affect. Her behavior is normal. Judgment and thought content normal    Assessment/Plan: 1. Functional deficits secondary to right peri-prosthetic femoral shaft fx which require 3+ hours per day of interdisciplinary therapy in a comprehensive inpatient rehab setting. Physiatrist is providing close team supervision and 24 hour management of active medical problems listed below. Physiatrist and rehab team continue to assess barriers to discharge/monitor patient progress toward functional and medical goals. FIM: FIM - Bathing Bathing Steps Patient Completed: Chest;Right Arm;Left Arm;Abdomen;Front perineal area;Buttocks;Right upper leg;Left upper leg Bathing: 4: Min-Patient completes 8-9 61f 10 parts or 75+ percent  FIM - Upper Body Dressing/Undressing Upper body dressing/undressing steps patient completed: Thread/unthread right bra strap;Thread/unthread left bra strap;Hook/unhook bra;Thread/unthread right sleeve of pullover shirt/dresss;Thread/unthread left sleeve of pullover shirt/dress;Put head through opening of pull over shirt/dress;Pull shirt over trunk Upper body dressing/undressing: 5: Supervision: Safety issues/verbal cues FIM - Lower Body Dressing/Undressing Lower body dressing/undressing steps patient completed: Thread/unthread right pants leg;Thread/unthread left pants leg;Pull pants up/down;Don/Doff right sock;Don/Doff left sock Lower body dressing/undressing: 4: Min-Patient completed 75 plus % of tasks  FIM - Toileting Toileting steps completed by patient: Adjust clothing prior to toileting;Performs perineal hygiene;Adjust clothing after toileting Toileting Assistive Devices: Grab bar or rail for support Toileting: 4: Steadying assist  FIM -  Diplomatic Services operational officer Devices: Environmental consultant;Bedside commode Toilet Transfers:  6-More than reasonable amt of time;5-To toilet/BSC: Supervision (verbal cues/safety issues)  FIM - Banker Devices: Walker;Arm rests Bed/Chair Transfer: 5: Chair or W/C > Bed: Supervision (verbal cues/safety issues);5: Sit > Supine: Supervision (verbal cues/safety issues)  FIM - Locomotion: Wheelchair Distance: 30 feet Locomotion: Wheelchair: 1: Travels less than 50 ft with supervision, cueing or coaxing FIM - Locomotion: Ambulation Locomotion: Ambulation Assistive Devices: Designer, industrial/product Ambulation/Gait Assistance: 4: Min guard;5: Supervision Locomotion: Ambulation: 2: Travels 50 - 149 ft with supervision/safety issues  Comprehension Comprehension Mode: Auditory Comprehension: 5-Understands complex 90% of the time/Cues < 10% of the time  Expression Expression Mode: Verbal Expression: 6-Expresses complex ideas: With extra time/assistive device  Social Interaction Social Interaction: 5-Interacts appropriately 90% of the time - Needs monitoring or encouragement for participation or interaction.  Problem Solving Problem Solving: 5-Solves complex 90% of the time/cues < 10% of the time  Memory Memory: 5-Recognizes or recalls 90% of the time/requires cueing < 10% of the time  Medical Problem List and Plan:  1. Right periprosthetic femoral shaft fracture. Status post ORIF 12/01/2012.  2. DVT Prophylaxis/Anticoagulation: Chronic Coumadin therapy. INR therapeutic. 3. Pain Management: Hydrocodone and Robaxin as needed.  -schedule tylenol for now  -will need to minimize narcotic use  -utilize local remedies for pain also including heat and ice which we reviewed today  -will check hip xrays today although I suspect this is muscular  -try one scheduled ultram qam 4. Mood: Xanax 1 mg every 4 hours as needed. Wellbutrin 300 mg daily, Zoloft 150 mg daily.  Provide emotional support  -scheduled xanax qd  5. Neuropsych: This patient is capable of making decisions on her own behalf.  6. Hypertension. Norvasc 5 mg daily. Monitor for the effects of increased mobility and pain upon her BP.  7. Hyperlipidemia. Lipitor  8. 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 Urinary incontinence.   -is seen by urology as an outpt-- UDS was planned  -ua neg, culture negative  LOS (Days) 10 A FACE TO FACE EVALUATION WAS PERFORMED  Mykeal Carrick T 12/12/2012 7:27 AM

## 2012-12-12 NOTE — Progress Notes (Signed)
Followup x-rays of right hip shows new proximal medial femoral fracture. Spoke with Dr. Fernande Boyden office and films to be reviewed and await further recommendations. Patient is currently placed on bed rest. Contacts are made to the son in reference to x-ray report and updated

## 2012-12-12 NOTE — Progress Notes (Signed)
Occupational Therapy Session Note  Patient Details  Name: Renee Adams MRN: [id number] Date of Birth: 11-02-1933  Today's Date: 12/12/2012 Time: 0900-0915 Time Calculation (min): 15 min  Short Term Goals: Week 2:  OT Short Term Goal 1 (Week 2):  (STG=LTG secondary to ELOS)  Skilled Therapeutic Interventions/Progress Updates:    Initiated ADLs this morning and was sitting at sink for grooming.  Hospital transporters arrived to take patient to Xray.  Pt amb with RW from sink to bed to be transported.  Pt missed 45 mins skilled OT services secondary to Xray.  Therapy Documentation Precautions:  Precautions Precautions: Posterior Hip;Fall Precaution Comments: Bilateral Posterior Hip precautions Restrictions Weight Bearing Restrictions: No RLE Weight Bearing: Weight bearing as tolerated Other Position/Activity Restrictions: OK for Hip ROM (within post prec), and no restrictions on knee ROM General: General Amount of Missed OT Time (min): 45 Minutes Pain: Pain Assessment Pain Assessment: 0-10 Pain Score:   3 Pain Location: Leg Pain Orientation: Right Pain Descriptors / Indicators: Sore Pain Onset: On-going Patients Stated Pain Goal: 1  See FIM for current functional status  Therapy/Group: Individual Therapy  Rich Brave 12/12/2012, 9:36 AM

## 2012-12-13 ENCOUNTER — Inpatient Hospital Stay (HOSPITAL_COMMUNITY): Payer: Medicare Other | Admitting: Physical Therapy

## 2012-12-13 DIAGNOSIS — I10 Essential (primary) hypertension: Secondary | ICD-10-CM

## 2012-12-13 DIAGNOSIS — F341 Dysthymic disorder: Secondary | ICD-10-CM

## 2012-12-13 DIAGNOSIS — W19XXXA Unspecified fall, initial encounter: Secondary | ICD-10-CM

## 2012-12-13 DIAGNOSIS — S72309A Unspecified fracture of shaft of unspecified femur, initial encounter for closed fracture: Secondary | ICD-10-CM

## 2012-12-13 LAB — PROTIME-INR: Prothrombin Time: 19.1 seconds — ABNORMAL HIGH (ref 11.6–15.2)

## 2012-12-13 MED ORDER — TRAMADOL HCL 50 MG PO TABS
50.0000 mg | ORAL_TABLET | Freq: Once | ORAL | Status: AC
  Administered 2012-12-13: 50 mg via ORAL

## 2012-12-13 MED ORDER — TRAMADOL HCL 50 MG PO TABS
50.0000 mg | ORAL_TABLET | Freq: Two times a day (BID) | ORAL | Status: DC
  Administered 2012-12-13 – 2012-12-17 (×8): 50 mg via ORAL
  Filled 2012-12-13 (×9): qty 1

## 2012-12-13 MED ORDER — WARFARIN SODIUM 5 MG PO TABS
5.0000 mg | ORAL_TABLET | Freq: Once | ORAL | Status: AC
  Administered 2012-12-13: 5 mg via ORAL
  Filled 2012-12-13: qty 1

## 2012-12-13 MED ORDER — WARFARIN SODIUM 2 MG PO TABS
2.0000 mg | ORAL_TABLET | ORAL | Status: DC
  Filled 2012-12-13: qty 1

## 2012-12-13 NOTE — Progress Notes (Signed)
Physical Therapy Note  Patient Details  Name: Renee Adams MRN: [id number] Date of Birth: 1934/04/02 Today's Date: 12/13/2012  1300-1355 (55 minutes) group Pain: no reported pain Pt participated in PT group session . Pt unable to stand and step on left and maintain TDWB RT LE using RW . Pt propelled wc 100 feet X 3 on unit for activity tolerance and UE strengthening.    Shondra Capps,JIM 12/13/2012, 2:01 PM

## 2012-12-13 NOTE — Progress Notes (Signed)
Patient ID: Renee Adams, female   DOB: [birth year], 79 y.o.   MRN: [id number] Subjective/Complaints: Right hip is "substantially" more sore than it had been a couple days ago.---more in anterior thigh/inguinal area, slept poorly last noc  A 12 point review of systems has been performed and if not noted above is otherwise negative.   Objective: Vital Signs: Blood pressure 103/61, pulse 86, temperature 98 F (36.7 C), temperature source Oral, resp. rate 17, weight 69.128 kg (152 lb 6.4 oz), SpO2 93.00%. Dg Hip Complete Right  12/12/2012   *RADIOLOGY REPORT*  Clinical Data: Increased right inguinal pain  RIGHT HIP - COMPLETE 2+ VIEW  Comparison: 11/25/2012  Findings: Bilateral total hip arthroplasty is noted.  The patient has undergone screw plate and wire fixation of a femoral fracture since the previous exam with that fracture line not fully visualized on this film.  There is a new fracture line identified involving the medial proximal femur extending from below the lesser trochanter and extending to the underlying intramedullary rod at the level of the lesser trochanter. Mild medial displacement of the proximal fracture fragment is seen.  No other acute fracture is noted about the pelvic ring.  The sacral white lines and pubic symphysis appear maintained.  No adverse features are seen associated with the acetabular component of either hip replacement and the visualized portion of the left femoral prothesis.  Surgical staples are seen in the soft tissues of the lateral right thigh.  IMPRESSION: New proximal medial femoral fracture as described above.  This report was discussed with Deatra Ina, PA.   Original Report Authenticated By: Rhodia Albright, M.D.   No results found for this basename: WBC, HGB, HCT, PLT,  in the last 72 hours No results found for this basename: NA, K, CL, CO, GLUCOSE, BUN, CREATININE, CALCIUM,  in the last 72 hours CBG (last 3)  No results found for this basename: GLUCAP,  in  the last 72 hours  Wt Readings from Last 3 Encounters:  12/10/12 69.128 kg (152 lb 6.4 oz)  11/28/12 63.504 kg (140 lb)  11/28/12 63.504 kg (140 lb)    Physical Exam:  Constitutional: She is oriented to person, place, and time. She appears well-developed and well-nourished. Hard of hearing HENT:  Head: Normocephalic and atraumatic.  Right Ear: External ear normal.  Left Ear: External ear normal.  Eyes: Conjunctivae and EOM are normal. Pupils are equal, round, and reactive to light. Right eye exhibits no discharge. Left eye exhibits no discharge. No scleral icterus.  Neck: Normal range of motion. Neck supple. No JVD present. No tracheal deviation present. No thyromegaly present.  Cardiovascular: Normal rate and regular rhythm. Exam reveals no friction rub.  No murmur heard.  Pulmonary/Chest: Effort normal and breath sounds normal. No respiratory distress. She has no wheezes.  Abdominal: Soft. Bowel sounds are normal. She exhibits no distension and no mass. There is no tenderness. There is no rebound and no guarding.  Musculoskeletal:    Joint deformities in the hands.  Lymphadenopathy:  She has no cervical adenopathy.  Neurological: She is alert and oriented to person, place, and time. UE is 4/5. RLE is 2-3/5 (pain) proximally to 4-/5 distally. LLE is 4/5 proximally to distally.  Skin: No rash noted. No erythema.  Surgical site clean and dry with staples. Decreased bruising and swelling around incision site. Right thigh is not tender or swollen Psychiatric: She has a normal mood and affect. Her behavior is normal. Judgment and thought content normal  Assessment/Plan: 1. Functional deficits secondary to right peri-prosthetic femoral shaft fx which require 3+ hours per day of interdisciplinary therapy in a comprehensive inpatient rehab setting. Physiatrist is providing close team supervision and 24 hour management of active medical problems listed below. Physiatrist and rehab team  continue to assess barriers to discharge/monitor patient progress toward functional and medical goals. FIM: FIM - Bathing Bathing Steps Patient Completed: Chest;Right Arm;Left Arm;Abdomen;Front perineal area;Buttocks;Right lower leg (including foot);Left upper leg;Right upper leg;Left lower leg (including foot) Bathing: 4: Min-Patient completes 8-9 35f 10 parts or 75+ percent (for sit->stand for peri care)  FIM - Upper Body Dressing/Undressing Upper body dressing/undressing steps patient completed: Thread/unthread right bra strap;Thread/unthread left bra strap;Thread/unthread right sleeve of pullover shirt/dresss;Thread/unthread left sleeve of pullover shirt/dress;Put head through opening of pull over shirt/dress;Pull shirt over trunk (needed assist for hooking bra) Upper body dressing/undressing: 4: Min-Patient completed 75 plus % of tasks FIM - Lower Body Dressing/Undressing Lower body dressing/undressing steps patient completed: Thread/unthread right pants leg;Thread/unthread left pants leg;Pull pants up/down;Don/Doff right sock;Don/Doff left sock Lower body dressing/undressing: 1: Total-Patient completed less than 25% of tasks (secondary to decreased time)  FIM - Toileting Toileting steps completed by patient: Adjust clothing prior to toileting;Adjust clothing after toileting;Performs perineal hygiene Toileting Assistive Devices: Grab bar or rail for support Toileting: 4: Steadying assist  FIM - Diplomatic Services operational officer Devices: Grab bars Toilet Transfers: 5-To toilet/BSC: Supervision (verbal cues/safety issues)  FIM - Banker Devices: Therapist, occupational: 4: Supine > Sit: Min A (steadying Pt. > 75%/lift 1 leg)  FIM - Locomotion: Wheelchair Distance: 118 Locomotion: Wheelchair: 2: Travels 50 - 149 ft with supervision, cueing or coaxing FIM - Locomotion: Ambulation Locomotion: Ambulation Assistive Devices: Dealer Ambulation/Gait Assistance: 5: Supervision Locomotion: Ambulation: 2: Travels 50 - 149 ft with supervision/safety issues  Comprehension Comprehension Mode: Auditory Comprehension: 5-Understands complex 90% of the time/Cues < 10% of the time  Expression Expression Mode: Verbal Expression: 6-Expresses complex ideas: With extra time/assistive device  Social Interaction Social Interaction: 5-Interacts appropriately 90% of the time - Needs monitoring or encouragement for participation or interaction.  Problem Solving Problem Solving: 5-Solves basic 90% of the time/requires cueing < 10% of the time  Memory Memory: 5-Recognizes or recalls 90% of the time/requires cueing < 10% of the time  Medical Problem List and Plan:  1. Right periprosthetic femoral shaft fracture. Medial prox femur fx, TDWB Status post ORIF 12/01/2012.  2. DVT Prophylaxis/Anticoagulation: Chronic Coumadin therapy. INR therapeutic. 3. Pain Management: Hydrocodone and Robaxin as needed.  -schedule tylenol for now  -will need to minimize narcotic use, increase tramadol to BID  -utilize local remedies for pain also including heat and ice which we reviewed today  -  -try one scheduled ultram qam 4. Mood: Xanax 1 mg every 4 hours as needed. Wellbutrin 300 mg daily, Zoloft 150 mg daily. Provide emotional support  -scheduled xanax qd  5. Neuropsych: This patient is capable of making decisions on her own behalf.  6. Hypertension. Norvasc 5 mg daily. Monitor for the effects of increased mobility and pain upon her BP.  7. Hyperlipidemia. Lipitor  8. 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 Urinary incontinence.   -is seen by urology as an outpt-- UDS was planned  -ua neg, culture negative  LOS (Days) 11 A FACE TO FACE EVALUATION WAS PERFORMED  Dorena Dorfman E 12/13/2012 8:30 AM

## 2012-12-13 NOTE — Progress Notes (Signed)
ANTICOAGULATION CONSULT NOTE - Follow Up Consult  Pharmacy Consult for Coumadin Indication: ASD/TIA  Allergies  Allergen Reactions  . Dilaudid (Hydromorphone Hcl) Shortness Of Breath    Tolerates tramadol  . Morphine And Related Shortness Of Breath    Tolerates tramadol  . Sulfa Antibiotics Other (See Comments)    "Made me drunk";  wobbly  . Keflex (Cephalexin) Nausea Only  . Levaquin (Levofloxacin) Hives and Rash    Patient Measurements: Weight: 152 lb 6.4 oz (69.128 kg) Heparin Dosing Weight:   Vital Signs: Temp: 98 F (36.7 C) (06/07 0547) Temp src: Oral (06/07 0547) BP: 103/61 mmHg (06/07 0547) Pulse Rate: 86 (06/07 0547)  Labs:  Recent Labs  12/11/12 0530 12/12/12 0615 12/13/12 0420  LABPROT 27.1* 20.5* 19.1*  INR 2.67* 1.83* 1.66*    The CrCl is unknown because both a height and weight (above a minimum accepted value) are required for this calculation.   Medications:  Scheduled:  . acetaminophen  500 mg Oral BID WC  . amLODipine  5 mg Oral Daily  . atorvastatin  10 mg Oral Daily  . buPROPion  300 mg Oral Daily  . calcium carbonate  1,250 mg Oral BID WC  . lactose free nutrition  120 mL Oral TID WC & HS  . latanoprost  1 drop Both Eyes QHS  . sertraline  150 mg Oral Daily  . traMADol  50 mg Oral Q12H  . warfarin  2 mg Oral Q48H  . warfarin  3 mg Oral Q48H  . Warfarin - Pharmacist Dosing Inpatient   Does not apply q1800    Assessment: 79yo female on Coumadin for hx ASD/TIA.  INR down again to 1.64 today, expect reflecting missed dose of 6/4.  No bleeding problems noted.  Will give larger dose to boost today and recheck INR in AM  Goal of Therapy:  INR 2-3 Monitor platelets by anticoagulation protocol: Yes   Plan:  1.  Coumadin 5mg  today 2.  INR in AM  Marisue Humble, PharmD Clinical Pharmacist [facility]

## 2012-12-14 ENCOUNTER — Inpatient Hospital Stay (HOSPITAL_COMMUNITY): Payer: Medicare Other | Admitting: Occupational Therapy

## 2012-12-14 LAB — PROTIME-INR: INR: 1.82 — ABNORMAL HIGH (ref 0.00–1.49)

## 2012-12-14 MED ORDER — WARFARIN SODIUM 2 MG PO TABS
2.0000 mg | ORAL_TABLET | ORAL | Status: DC
  Filled 2012-12-14: qty 1

## 2012-12-14 MED ORDER — WARFARIN SODIUM 3 MG PO TABS
3.0000 mg | ORAL_TABLET | ORAL | Status: DC
  Administered 2012-12-15: 3 mg via ORAL
  Filled 2012-12-14: qty 1

## 2012-12-14 MED ORDER — WARFARIN SODIUM 5 MG PO TABS
5.0000 mg | ORAL_TABLET | Freq: Once | ORAL | Status: AC
  Administered 2012-12-14: 5 mg via ORAL
  Filled 2012-12-14: qty 1

## 2012-12-14 NOTE — Progress Notes (Signed)
Patient ID: Renee Adams, female   DOB: 08-16-1933, 79 y.o.   MRN: [id number] Subjective/Complaints: Right hip is less painful today, tolerated PT/OT yesterday A 12 point review of systems has been performed and if not noted above is otherwise negative.   Objective: Vital Signs: Blood pressure 127/75, pulse 79, temperature 98 F (36.7 C), temperature source Oral, resp. rate 17, weight 69.128 kg (152 lb 6.4 oz), SpO2 96.00%. No results found. No results found for this basename: WBC, HGB, HCT, PLT,  in the last 72 hours No results found for this basename: NA, K, CL, CO, GLUCOSE, BUN, CREATININE, CALCIUM,  in the last 72 hours CBG (last 3)  No results found for this basename: GLUCAP,  in the last 72 hours  Wt Readings from Last 3 Encounters:  12/10/12 69.128 kg (152 lb 6.4 oz)  11/28/12 63.504 kg (140 lb)  11/28/12 63.504 kg (140 lb)    Physical Exam:  Constitutional: She is oriented to person, place, and time. She appears well-developed and well-nourished. Hard of hearing HENT:  Head: Normocephalic and atraumatic.  Right Ear: External ear normal.  Left Ear: External ear normal.  Eyes: Conjunctivae and EOM are normal. Pupils are equal, round, and reactive to light. Right eye exhibits no discharge. Left eye exhibits no discharge. No scleral icterus.  Neck: Normal range of motion. Neck supple. No JVD present. No tracheal deviation present. No thyromegaly present.  Cardiovascular: Normal rate and regular rhythm. Exam reveals no friction rub.  No murmur heard.  Pulmonary/Chest: Effort normal and breath sounds normal. No respiratory distress. She has no wheezes.  Abdominal: Soft. Bowel sounds are normal. She exhibits no distension and no mass. There is no tenderness. There is no rebound and no guarding.  Musculoskeletal:    Joint deformities in the hands.  Lymphadenopathy:  She has no cervical adenopathy.  Neurological: She is alert and oriented to person, place, and time. UE is 4/5.  RLE is 2-3/5 (pain) proximally to 4-/5 distally. LLE is 4/5 proximally to distally.  Skin: No rash noted. No erythema.  Surgical site clean and dry with staples. Decreased bruising and swelling around incision site. Right thigh is not tender or swollen Psychiatric: She has a normal mood and affect. Her behavior is normal. Judgment and thought content normal    Assessment/Plan: 1. Functional deficits secondary to right peri-prosthetic femoral shaft fx which require 3+ hours per day of interdisciplinary therapy in a comprehensive inpatient rehab setting. Physiatrist is providing close team supervision and 24 hour management of active medical problems listed below. Physiatrist and rehab team continue to assess barriers to discharge/monitor patient progress toward functional and medical goals. FIM: FIM - Bathing Bathing Steps Patient Completed: Chest;Right Arm;Left Arm;Abdomen;Front perineal area;Buttocks;Right upper leg;Left upper leg Bathing: 4: Min-Patient completes 8-9 22f 10 parts or 75+ percent  FIM - Upper Body Dressing/Undressing Upper body dressing/undressing steps patient completed: Thread/unthread right bra strap;Thread/unthread left bra strap;Hook/unhook bra;Thread/unthread right sleeve of pullover shirt/dresss;Thread/unthread left sleeve of pullover shirt/dress;Put head through opening of pull over shirt/dress;Pull shirt over trunk Upper body dressing/undressing: 5: Set-up assist to: Obtain clothing/put away FIM - Lower Body Dressing/Undressing Lower body dressing/undressing steps patient completed: Thread/unthread right pants leg;Thread/unthread left pants leg;Pull pants up/down Lower body dressing/undressing: 2: Max-Patient completed 25-49% of tasks  FIM - Toileting Toileting steps completed by patient: Adjust clothing prior to toileting;Performs perineal hygiene Toileting Assistive Devices: Grab bar or rail for support Toileting: 3: Mod-Patient completed 2 of 3 steps (doffed  clothing in preparation  for bathing )  FIM - Archivist Transfers Assistive Devices: Bedside commode;Elevated toilet seat;Grab bars;Walker Toilet Transfers: 4-To toilet/BSC: Min A (steadying Pt. > 75%);4-From toilet/BSC: Min A (steadying Pt. > 75%)  FIM - Bed/Chair Transfer Bed/Chair Transfer Assistive Devices: Therapist, occupational: 0: Activity did not occur  FIM - Locomotion: Wheelchair Distance: 118 Locomotion: Wheelchair: 2: Travels 50 - 149 ft with supervision, cueing or coaxing FIM - Locomotion: Ambulation Locomotion: Ambulation Assistive Devices: Designer, industrial/product Ambulation/Gait Assistance: 5: Supervision Locomotion: Ambulation: 2: Travels 50 - 149 ft with supervision/safety issues  Comprehension Comprehension Mode: Auditory Comprehension: 5-Understands complex 90% of the time/Cues < 10% of the time  Expression Expression Mode: Verbal Expression: 6-Expresses complex ideas: With extra time/assistive device  Social Interaction Social Interaction: 5-Interacts appropriately 90% of the time - Needs monitoring or encouragement for participation or interaction.  Problem Solving Problem Solving: 5-Solves complex 90% of the time/cues < 10% of the time  Memory Memory: 5-Recognizes or recalls 90% of the time/requires cueing < 10% of the time  Medical Problem List and Plan:  1. Right periprosthetic femoral shaft fracture. Medial prox femur fx, TDWB Status post ORIF 12/01/2012.  2. DVT Prophylaxis/Anticoagulation: Chronic Coumadin therapy. INR therapeutic. 3. Pain Management: Hydrocodone and Robaxin as needed.  -schedule tylenol for now  -will need to minimize narcotic use, increase tramadol to BID  -utilize local remedies for pain also including heat and ice which we reviewed today  -  -try one scheduled ultram qam 4. Mood: Xanax 1 mg every 4 hours as needed. Wellbutrin 300 mg daily, Zoloft 150 mg daily. Provide emotional support  -scheduled xanax qd  5.  Neuropsych: This patient is capable of making decisions on her own behalf.  6. Hypertension. Norvasc 5 mg daily. Monitor for the effects of increased mobility and pain upon her BP.  7. Hyperlipidemia. Lipitor  8. 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 Urinary incontinence.   -is seen by urology as an outpt-- UDS was planned  -ua neg, culture negative  LOS (Days) 12 A FACE TO FACE EVALUATION WAS PERFORMED  Sophiamarie Nease E 12/14/2012 10:02 AM

## 2012-12-14 NOTE — Progress Notes (Signed)
Note reviewed and accurately reflects treatment session.   

## 2012-12-14 NOTE — Progress Notes (Signed)
ANTICOAGULATION CONSULT NOTE - Follow Up Consult  Pharmacy Consult for Coumadin Indication: ASD/TIA, s/p repair femur fracture  Allergies  Allergen Reactions  . Dilaudid (Hydromorphone Hcl) Shortness Of Breath    Tolerates tramadol  . Morphine And Related Shortness Of Breath    Tolerates tramadol  . Sulfa Antibiotics Other (See Comments)    "Made me drunk";  wobbly  . Keflex (Cephalexin) Nausea Only  . Levaquin (Levofloxacin) Hives and Rash    Patient Measurements: Weight: 152 lb 6.4 oz (69.128 kg) Heparin Dosing Weight:   Vital Signs: Temp: 98 F (36.7 C) (06/08 0520) Temp src: Oral (06/08 0520) BP: 127/75 mmHg (06/08 0520) Pulse Rate: 79 (06/08 0520)  Labs:  Recent Labs  12/12/12 0615 12/13/12 0420 12/14/12 0600  LABPROT 20.5* 19.1* 20.4*  INR 1.83* 1.66* 1.82*    The CrCl is unknown because both a height and weight (above a minimum accepted value) are required for this calculation.   Medications:  Scheduled:  . acetaminophen  500 mg Oral BID WC  . amLODipine  5 mg Oral Daily  . atorvastatin  10 mg Oral Daily  . buPROPion  300 mg Oral Daily  . calcium carbonate  1,250 mg Oral BID WC  . lactose free nutrition  120 mL Oral TID WC & HS  . latanoprost  1 drop Both Eyes QHS  . sertraline  150 mg Oral Daily  . traMADol  50 mg Oral Q12H  . [START ON 12/15/2012] warfarin  2 mg Oral Q48H  . warfarin  3 mg Oral Q48H  . Warfarin - Pharmacist Dosing Inpatient   Does not apply q1800    Assessment: 79yo female with history of ASD and TIA.  INR improved today to 1.82.  No bleeding problems noted.    Goal of Therapy:  INR 2-3 Monitor platelets by anticoagulation protocol: Yes   Plan:  1.  Coumadin 5mg  today, to achieve therapeutic INR 2.  Resume home dose 3mg  alt with 2mg  qoday, start 6/9. 3.  Continue MWF inr  Marisue Humble, PharmD Clinical Pharmacist [facility]

## 2012-12-14 NOTE — Progress Notes (Signed)
Occupational Therapy Session Note  Patient Details  Name: Renee Adams MRN: [id number] Date of Birth: [birth year]  Today's Date: 12/14/2012  Short Term Goals: Week 1:  OT Short Term Goal 1 (Week 1): Transfer to BSC/ commode with mod A  OT Short Term Goal 1 - Progress (Week 1): Met OT Short Term Goal 2 (Week 1): Perform sit to stand with mod A OT Short Term Goal 2 - Progress (Week 1): Met OT Short Term Goal 3 (Week 1): Perform standing balance with mod A OT Short Term Goal 3 - Progress (Week 1): Met OT Short Term Goal 4 (Week 1): Perform clothing management with mod A for dressing and toileting OT Short Term Goal 4 - Progress (Week 1): Met Week 2:  OT Short Term Goal 1 (Week 2):  (STG=LTG secondary to ELOS)  Skilled Therapeutic Interventions/Progress Updates:  Session Note #1: Time: 1478-2956 (45 mins) Pt with no report of pain.  Upon entering room, pt seated EOB eating breakfast. Pt reported soreness in BUE's. Pt educated on WB status in RLE during sit<>stand and when ambulating. OT donned pt's L shoe and R sock only to serve as a tactile/visual cue for WB precautions. Pt ambulated->W/C using R/W. Pt transferred from W/C<>3-in-1 over elevated toilet seat with steady assist. Pt completed bathing/dressing task from w/c level with sit<>stand for pulling up pants & donning diaper. At this time OT recommends elastic shoe laces to increase independence with donning/fastening of shoes. At end of tx session, pt seated in w/c brushing teeth with call bell within reach. NT notified of this.  Therapy Documentation Precautions:  Precautions Precautions: Posterior Hip;Fall Precaution Comments: Bilateral Posterior Hip precautions Restrictions Weight Bearing Restrictions: Yes RLE Weight Bearing: Weight bearing as tolerated Other Position/Activity Restrictions: OK for Hip ROM (within post prec), and no restrictions on knee ROM  See FIM for current functional status  Therapy/Group: Individual  Therapy  Lyonel Morejon 12/14/2012, 7:40 AM

## 2012-12-15 ENCOUNTER — Inpatient Hospital Stay (HOSPITAL_COMMUNITY): Payer: Medicare Other | Admitting: Physical Therapy

## 2012-12-15 ENCOUNTER — Inpatient Hospital Stay (HOSPITAL_COMMUNITY): Payer: Medicare Other

## 2012-12-15 DIAGNOSIS — W19XXXA Unspecified fall, initial encounter: Secondary | ICD-10-CM

## 2012-12-15 DIAGNOSIS — S72309A Unspecified fracture of shaft of unspecified femur, initial encounter for closed fracture: Secondary | ICD-10-CM

## 2012-12-15 DIAGNOSIS — I10 Essential (primary) hypertension: Secondary | ICD-10-CM

## 2012-12-15 DIAGNOSIS — F341 Dysthymic disorder: Secondary | ICD-10-CM

## 2012-12-15 NOTE — Progress Notes (Signed)
Assisted to BR, rectal prolapse observed. Patient reports looking into getting repaired before fall. Decreased sensation in relation to stools, felt she had loose stool, but no stool noted.Alfredo Martinez A

## 2012-12-15 NOTE — Progress Notes (Signed)
Patient ID: Renee Adams, female   DOB: 03-30-1934, 79 y.o.   MRN: [id number] Subjective/Complaints: Right hip is less painful today, tolerated PT/OT yesterday Asking about D/C A 12 point review of systems has been performed and if not noted above is otherwise negative.   Objective: Vital Signs: Blood pressure 136/72, pulse 82, temperature 98.2 F (36.8 C), temperature source Oral, resp. rate 19, weight 69.128 kg (152 lb 6.4 oz), SpO2 93.00%. No results found. No results found for this basename: WBC, HGB, HCT, PLT,  in the last 72 hours No results found for this basename: NA, K, CL, CO, GLUCOSE, BUN, CREATININE, CALCIUM,  in the last 72 hours CBG (last 3)  No results found for this basename: GLUCAP,  in the last 72 hours  Wt Readings from Last 3 Encounters:  12/10/12 69.128 kg (152 lb 6.4 oz)  11/28/12 63.504 kg (140 lb)  11/28/12 63.504 kg (140 lb)    Physical Exam:  Constitutional: She is oriented to person, place, and time. She appears well-developed and well-nourished. Hard of hearing HENT:  Head: Normocephalic and atraumatic.  Right Ear: External ear normal.  Left Ear: External ear normal.  Eyes: Conjunctivae and EOM are normal. Pupils are equal, round, and reactive to light. Right eye exhibits no discharge. Left eye exhibits no discharge. No scleral icterus.  Neck: Normal range of motion. Neck supple. No JVD present. No tracheal deviation present. No thyromegaly present.  Cardiovascular: Normal rate and regular rhythm. Exam reveals no friction rub.  No murmur heard.  Pulmonary/Chest: Effort normal and breath sounds normal. No respiratory distress. She has no wheezes.  Abdominal: Soft. Bowel sounds are normal. She exhibits no distension and no mass. There is no tenderness. There is no rebound and no guarding.  Musculoskeletal:    Joint deformities in the hands.  Lymphadenopathy:  She has no cervical adenopathy.  Neurological: She is alert and oriented to person, place,  and time. UE is 4/5. RLE is 2-3/5 (pain) proximally to 4-/5 distally. LLE is 4/5 proximally to distally.  Skin: No rash noted. No erythema.  Surgical site clean and dry with staples. Decreased bruising and swelling around incision site. Right thigh is not tender or swollen Psychiatric: She has a normal mood and affect. Her behavior is normal. Judgment and thought content normal    Assessment/Plan: 1. Functional deficits secondary to right peri-prosthetic femoral shaft fx which require 3+ hours per day of interdisciplinary therapy in a comprehensive inpatient rehab setting. D/C staples Ready for D/C in am. FIM: FIM - Bathing Bathing Steps Patient Completed: Chest;Right Arm;Left Arm;Abdomen;Front perineal area;Buttocks;Right upper leg;Left upper leg Bathing: 4: Min-Patient completes 8-9 84f 10 parts or 75+ percent  FIM - Upper Body Dressing/Undressing Upper body dressing/undressing steps patient completed: Thread/unthread right bra strap;Thread/unthread left bra strap;Hook/unhook bra;Thread/unthread right sleeve of pullover shirt/dresss;Thread/unthread left sleeve of pullover shirt/dress;Put head through opening of pull over shirt/dress;Pull shirt over trunk Upper body dressing/undressing: 5: Set-up assist to: Obtain clothing/put away FIM - Lower Body Dressing/Undressing Lower body dressing/undressing steps patient completed: Thread/unthread right pants leg;Thread/unthread left pants leg;Pull pants up/down Lower body dressing/undressing: 2: Max-Patient completed 25-49% of tasks  FIM - Toileting Toileting steps completed by patient: Adjust clothing prior to toileting;Performs perineal hygiene;Adjust clothing after toileting Toileting Assistive Devices: Grab bar or rail for support Toileting: 4: Steadying assist  FIM - Diplomatic Services operational officer Devices: Bedside commode;Elevated toilet seat;Grab bars;Walker Toilet Transfers: 4-To toilet/BSC: Min A (steadying Pt. > 75%);4-From  toilet/BSC: Min A (steadying  Pt. > 75%)  FIM - Bed/Chair Transfer Bed/Chair Transfer Assistive Devices: Therapist, occupational: 4: Bed > Chair or W/C: Min A (steadying Pt. > 75%);4: Chair or W/C > Bed: Min A (steadying Pt. > 75%)  FIM - Locomotion: Wheelchair Distance: 118 Locomotion: Wheelchair: 2: Travels 50 - 149 ft with supervision, cueing or coaxing FIM - Locomotion: Ambulation Locomotion: Ambulation Assistive Devices: Designer, industrial/product Ambulation/Gait Assistance: 5: Supervision Locomotion: Ambulation: 2: Travels 50 - 149 ft with supervision/safety issues  Comprehension Comprehension Mode: Auditory Comprehension: 5-Understands complex 90% of the time/Cues < 10% of the time  Expression Expression Mode: Verbal Expression: 6-Expresses complex ideas: With extra time/assistive device  Social Interaction Social Interaction: 5-Interacts appropriately 90% of the time - Needs monitoring or encouragement for participation or interaction.  Problem Solving Problem Solving: 5-Solves complex 90% of the time/cues < 10% of the time  Memory Memory: 5-Recognizes or recalls 90% of the time/requires cueing < 10% of the time  Medical Problem List and Plan:  1. Right periprosthetic femoral shaft fracture. Medial prox femur fx, TDWB Status post ORIF 12/01/2012.  2. DVT Prophylaxis/Anticoagulation: Chronic Coumadin therapy. INR therapeutic. 3. Pain Management: Hydrocodone and Robaxin as needed.  -schedule tylenol for now  -will need to minimize narcotic use, increase tramadol to BID  -utilize local remedies for pain also including heat and ice which we reviewed today  -  -try one scheduled ultram qam 4. Mood: Xanax 1 mg every 4 hours as needed. Wellbutrin 300 mg daily, Zoloft 150 mg daily. Provide emotional support  -scheduled xanax qd  5. Neuropsych: This patient is capable of making decisions on her own behalf.  6. Hypertension. Norvasc 5 mg daily. Monitor for the effects of increased  mobility and pain upon her BP.  7. Hyperlipidemia. Lipitor  8. 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 Urinary incontinence.   -is seen by urology as an outpt-- UDS was planned  -ua neg, culture negative  LOS (Days) 13 A FACE TO FACE EVALUATION WAS PERFORMED  Claudette Laws E 12/15/2012 8:49 AM

## 2012-12-15 NOTE — Progress Notes (Signed)
ANTICOAGULATION CONSULT NOTE - Follow Up Consult  Pharmacy Consult for coumadin Indication: ASD/TIA, s/p repair femur fracture  Allergies  Allergen Reactions  . Dilaudid (Hydromorphone Hcl) Shortness Of Breath    Tolerates tramadol  . Morphine And Related Shortness Of Breath    Tolerates tramadol  . Sulfa Antibiotics Other (See Comments)    "Made me drunk";  wobbly  . Keflex (Cephalexin) Nausea Only  . Levaquin (Levofloxacin) Hives and Rash    Patient Measurements: Weight: 152 lb 6.4 oz (69.128 kg) Heparin Dosing Weight:   Vital Signs: Temp: 98.2 F (36.8 C) (06/09 0708) Temp src: Oral (06/09 0708) BP: 136/72 mmHg (06/09 0708) Pulse Rate: 82 (06/09 0708)  Labs:  Recent Labs  12/13/12 0420 12/14/12 0600 12/15/12 0520  LABPROT 19.1* 20.4* 24.5*  INR 1.66* 1.82* 2.33*    The CrCl is unknown because both a height and weight (above a minimum accepted value) are required for this calculation.   Medications:  Scheduled:  . acetaminophen  500 mg Oral BID WC  . amLODipine  5 mg Oral Daily  . atorvastatin  10 mg Oral Daily  . buPROPion  300 mg Oral Daily  . calcium carbonate  1,250 mg Oral BID WC  . lactose free nutrition  120 mL Oral TID WC & HS  . latanoprost  1 drop Both Eyes QHS  . sertraline  150 mg Oral Daily  . traMADol  50 mg Oral Q12H  . [START ON 12/16/2012] warfarin  2 mg Oral Q48H  . warfarin  3 mg Oral Q48H  . Warfarin - Pharmacist Dosing Inpatient   Does not apply q1800   Infusions:    Assessment: 79 yo female with ASD/TIA, s/p repair femur fracture is currently on therapeutic coumadin.  INR jumped from 1.82 to 2.33 most likely from 5mg  dose of coumadin yesterday.   Goal of Therapy:  INR 2-3    Plan:  1) Resume coumadin 3mg  alt with 2mg  every other day 2) Check an extra INR tomorrow due to jump in INR.  Earle Burson, Tsz-Yin 12/15/2012,8:17 AM

## 2012-12-15 NOTE — Progress Notes (Signed)
Occupational Therapy Note  Patient Details  Name: DANAIJA ESKRIDGE MRN: [id number] Date of Birth: [birth year] Today's Date: 12/15/2012  Time: 1400-1445 Pt denies pain Individual Therapy  Pt seated in w/c upon arrival.  Pt practiced stepping over 4" ledge to simulate stepping into walk-in shower at home.  Pt required min verbal cues to maintain TDWBing when pushing up through RW to place LLE into "shower."  Pt issued elastic laces and practiced doffing/donning shoe while maintaining hip precautions.  Pt stated her daughter will be in tomorrow for education and practice.   Lavone Neri [facility] 12/15/2012, 3:39 PM

## 2012-12-15 NOTE — Progress Notes (Signed)
Occupational Therapy Note  Patient Details  Name: Renee Adams MRN: [id number] Date of Birth: 1934-02-10 Today's Date: 12/15/2012  Time: 10:40-11:40am ( .) Pt seen for 1:1 OT session focusing on overall safety and adhering to precautions during ADL activities. Pt sitting in wheelchair upon arrival, ready to bathe and dress. Pt reported pain in RLE of 2/10. Pt's weight bearing status recently upgraded to TTWB. Went over what TTWB means and also went over precautions with pt, with pt able to verbalize and demonstrate. Pt showered, sitting for most, standing at GB to wash perineal and buttock area with steadying assist. Pt takes increased time to complete activities. Pt dressed UB sitting in wheelchair and sat to donn underwear and pants, standing up at sink with steadying assist to pull up. Pt seemed a little hesitant during transfers and standing, although took her time and was able to do it. Pt utilized LH sponge and reacher during bathing and dressing and stated she may need more practice with the sock aide. Secondary to time constraints, was not able to practice sock aide this morning.    Jenayah Antu Hessie Diener 12/15/2012, 11:49 AM

## 2012-12-15 NOTE — Progress Notes (Signed)
Physical Therapy Session Note  Patient Details  Name: Renee Adams MRN: [id number] Date of Birth: Sep 07, 1933  Today's Date: 12/15/2012 Time: 2952-8413 Time Calculation (min): 55 min  Skilled Therapeutic Interventions/Progress Updates:    Pt getting staples taken out by nursing initially, discussed D/C and safety with mobility upon return home. Discussed new weight bearing status, pt reports she feels uncomfortable with this and after the session reported she would feel safer with extension of one day to allow her more time to practice her weight bearing status. Social worker and team made aware.  Ran trial of more narrow wheelchair which would allow pt to negotiate house better, pt did well and is currently supervision/modified independent with wheelchair with some difficulty with turns and backing. Curb step x 1 with RW and close supervision, 1 step x 1 reps backwards with RW to maintain TDWB precautions (close supervision). PT demonstrated both prior to pt performance. Ambulation x 25' with RW through tight spaces working on side stepping/side stepping through narrow obstacles.   Pt continues with c/o pain in Rt. Hip consistent with Rt. Hip flexor soreness.  Therapy Documentation Precautions:  Precautions Precautions: Posterior Hip;Fall Precaution Comments: Bilateral Posterior Hip precautions Restrictions Weight Bearing Restrictions: Yes RLE Weight Bearing: Touchdown weight bearing Other Position/Activity Restrictions: OK for Hip ROM (within post prec), and no restrictions on knee ROM Pain: Pain Assessment Pain Assessment: 0-10 Pain Score:   3 Pain Type: Acute pain Pain Location: Head Pain Orientation: Right Pain Descriptors / Indicators: Headache Pain Frequency: Intermittent Pain Onset: Gradual Patients Stated Pain Goal: 3 Pain Intervention(s): Medication (See eMAR) Multiple Pain Sites: No  See FIM for current functional status  Therapy/Group: Individual  Therapy  Wilhemina Bonito 12/15/2012, 12:05 PM

## 2012-12-15 NOTE — Progress Notes (Signed)
Physical Therapy Session Note  Patient Details  Name: Renee Adams MRN: [id number] Date of Birth: [birth year]  Today's Date: 12/15/2012 Time: 1135-1205 Time Calculation (min): 30 min  Short Term Goals: Week 1:  PT Short Term Goal 1 (Week 1): Pt will perform functional transfers with min A PT Short Term Goal 1 - Progress (Week 1): Met PT Short Term Goal 2 (Week 1): Pt will gait in controlled environment with min A 50' PT Short Term Goal 2 - Progress (Week 1): Not met  Skilled Therapeutic Interventions/Progress Updates:   Pt reporting pain in hip; RN notified for pain medication.  While awaiting pain medication assisted pt with donning L shoe to assist with maintaining TDWB; also discussed new WB status, how that may affect her ability to perform ambulation longer distances with RW and use of w/c in the home for UE joint and energy conservation.  Pt verbalized understanding.  Patient performed w/c mobility in controlled environment x 40' with supervision.  Pt also verbalized sequence for ascending one step with RW backwards to maintain WB status but could not recall how to descend with RW; verbalized and demonstrated sequence again to patient but did not practice again secondary to shoulder/UE fatigue.  Also verbalized and demonstrated to pt how her daughter could bump her up the one step in the w/c; pt also reports that she may have a ramp built by tomorrow or Wednesday.  Reviewed with patient how to perform stand pivot with RW for simulated car transfer and maintain WB status and hip precautions by reclining back of seat.  Pt gave repeat demonstration with supervision and verbal cues for toe touch WB when returning to w/c.  Will also need to practice with daughter.    Therapy Documentation Precautions:  Precautions Precautions: Posterior Hip;Fall Precaution Comments: Bilateral Posterior Hip precautions Restrictions Weight Bearing Restrictions: Yes RLE Weight Bearing: Touchdown weight  bearing Other Position/Activity Restrictions: OK for Hip ROM (within post prec), and no restrictions on knee ROM Pain: Pain Assessment Pain Assessment: 0-10 Pain Score:   3 Pain Type: Acute pain Pain Location: Head Pain Orientation: Right Pain Descriptors / Indicators: Headache Pain Frequency: Intermittent Pain Onset: Gradual Patients Stated Pain Goal: 3 Pain Intervention(s): Medication (See eMAR) Multiple Pain Sites: No   See FIM for current functional status  Therapy/Group: Individual Therapy  Edman Circle [facility] 12/15/2012, 12:43 PM

## 2012-12-16 ENCOUNTER — Inpatient Hospital Stay (HOSPITAL_COMMUNITY): Payer: Medicare Other | Admitting: Physical Therapy

## 2012-12-16 ENCOUNTER — Inpatient Hospital Stay (HOSPITAL_COMMUNITY): Payer: Medicare Other | Admitting: Occupational Therapy

## 2012-12-16 LAB — PROTIME-INR: INR: 3.23 — ABNORMAL HIGH (ref 0.00–1.49)

## 2012-12-16 MED ORDER — WARFARIN 0.5 MG HALF TABLET
0.5000 mg | ORAL_TABLET | Freq: Once | ORAL | Status: AC
  Administered 2012-12-16: 0.5 mg via ORAL
  Filled 2012-12-16: qty 1

## 2012-12-16 NOTE — Progress Notes (Signed)
Social Work Patient ID: Renee Adams, female   DOB: Mar 29, 1934, 79 y.o.   MRN: [id number]   Of note, pt's d/c date was recommended by team to be changed to 6/11 due to changes in WB status which require further training with patient. Plan to d/c in morning after family education completed.  Tessi Eustache, LCSW

## 2012-12-16 NOTE — Discharge Summary (Signed)
Renee Adams, CAMPOY               ACCOUNT NO.:  [id number]  MEDICAL RECORD NO.:  [id number]  LOCATION:  4003                         FACILITY:  [facility]  PHYSICIAN:  Ranelle Oyster, M.D.DATE OF BIRTH:  1934-05-14  DATE OF ADMISSION:  12/02/2012 DATE OF DISCHARGE:                              DISCHARGE SUMMARY   DISCHARGE DIAGNOSES: 1. Right periprosthetic femoral shaft fracture.  New right proximal     medial femoral fracture.  Status post open reduction and internal     fixation, Dec 01, 2012. 2. Chronic Coumadin therapy. 3. Pain management. 4. Anxiety. 5. Hypertension. 6. Hyperlipidemia. 7. Contact precautions for MRSA nasal nares. 8. History of urinary incontinence.  HISTORY OF PRESENT ILLNESS:  This is a 79 year old right-handed female with history of ASD and TIA maintained on chronic Coumadin as well as right total hip arthroplasty years ago, and a revision of total knee in the last couple of years.  The patient lives alone, was independent prior to admission.  Admitted Nov 26, 2012, after a fall sustaining a small spiral fracture at the tip of the total hip stem.  INR upon admission of 1.94.  The patient underwent ORIF of periprosthetic femur fracture Dec 01, 2012, per Dr. [name].  The patient was weightbearing as tolerated with posterior hip precautions.  Chronic Coumadin resumed with subcutaneous Lovenox to INR greater than 2.00.  Postoperative pain control.  Contact precautions for MRSA nasal nares.  Physical and occupational therapy ongoing.  The patient was admitted for a comprehensive rehab program.  PAST MEDICAL HISTORY:  See discharge diagnoses.  SOCIAL HISTORY:  Lives alone, 1 level home.  FUNCTIONAL HISTORY:  Prior to admission, independent driving.  She is retired.  FUNCTIONAL STATUS:  Upon admission to rehab services was +2 total assist for sit to stand.  PHYSICAL EXAMINATION:  VITAL SIGNS:  Blood pressure 120/62, pulse 75, temperature 98.4,  respirations 16. GENERAL:  This was an alert female, oriented x3.  Pupils round and reactive to light. LUNGS:  Clear to auscultation. CARDIAC:  Rate controlled. ABDOMEN:  Soft, nontender.  Good bowel sounds. MUSCULOSKELETAL:  Right hip, appropriately tender.  Rheumatoid arthritis deformities in the hands.  REHABILITATION HOSPITAL COURSE:  The patient was admitted to inpatient rehab services with therapies initiated on a 3-hour daily basis consisting of physical therapy, occupational therapy, and rehabilitation nursing.  The following issues were addressed during the patient's rehabilitation stay.  Pertaining to Ms. Stanforth's right periprosthetic femoral shaft fracture.  She had undergone ORIF Dec 01, 2012, per Dr. [name] was weightbearing as tolerated with posterior hip precautions. Followup x-rays completed December 12, 2012, secondary to some increasing pain showed a new proximal medial femoral fracture.  Contacts were made to Dr. Tobin Chad office in reference to latest x-ray reports advised to change weightbearing to touchdown weightbearing.  No surgical intervention was recommended at this time.  Neurovascular sensation intact and the patient was to continue with therapies as advised.  She remained on chronic Coumadin for history of ASD, TIA, latest INR of 3.23 with no bleeding episodes.  She was followed by Dr. Eldridge Dace which would continue to follow at time of discharge.  Pain management monitored closely with hydrocodone,  Robaxin as needed, minimizing of narcotics as much as possible due to bouts of lethargy.  A cranial CT scan was completed due to some increased lethargy that showed no acute changes, felt related to narcotics and monitored closely.  She did have a history of anxiety, depression, and she continued on Wellbutrin and Zoloft as well as Xanax with emotional support provided.  Her blood pressures were well controlled on Norvasc.  She had no chest pain or shortness of breath.   She remained on contact precautions for MRSA nasal nares.  The patient received weekly collaborative interdisciplinary team conferences to discuss estimated length of stay, family teaching, and any barriers to her discharge.  She was minimal assist upper body, total assist lower body dressing.  She was touchdown weightbearing to right lower extremity and some cues.  Ambulation remained limited due to downgrade in weightbearing status.  The family was considering building a ramp to the entrance of the once step entrance.  Full family teaching was completed. Plan will be discharged to home with family.  Ongoing therapies dictated as per rehab services.  DISCHARGE MEDICATIONS: 1. Tylenol as needed. 2. Xanax 0.5 mg every 4 hours as needed for anxiety. 3. Norvasc 5 mg p.o. daily. 4. Lipitor 10 mg p.o. daily. 5. Wellbutrin 300 mg p.o. daily. 6. Os-Cal 1250 mg twice daily. 7. Hydrocodone 1 or 2 tablets every 6 hours as needed for pain. 8. Xalatan ophthalmic solution 1 drop both eyes at bedtime. 9. Robaxin 500 mg every 6 hours as needed for muscle spasms. 10.Requip 1 mg p.o. at bedtime as needed. 11.Zoloft 150 mg p.o. daily. 12.Coumadin latest dose of 2 mg adjusted accordingly for an INR of 2.0     to 3.0.  DIET:  Regular.  SPECIAL INSTRUCTIONS:  Were touchdown weightbearing right lower extremity with posterior hip precautions.  The patient would follow up with Dr. Eldridge Dace in relation to Coumadin therapy with a home health nurse provided.     Mariam Dollar, P.A.   ______________________________ Ranelle Oyster, M.D.    DA/MEDQ  D:  12/16/2012  T:  12/16/2012  Job:  [id number]  cc:   Ranelle Oyster, M.D. Mila Homer. [name], M.D. Corky Crafts, MD

## 2012-12-16 NOTE — Discharge Summary (Signed)
  Discharge summary job # [id number]

## 2012-12-16 NOTE — Progress Notes (Signed)
Physical Therapy Session Note  Patient Details  Name: Renee Adams MRN: [id number] Date of Birth: 15-Jan-1934  Today's Date: 12/16/2012 Time: 0908-1000 Time Calculation (min): 52 min  Short Term Goals: Week 2:  PT Short Term Goal 1 (Week 2): Pt will ambulate x 50' with min assist PT Short Term Goal 2 (Week 2): Pt will perform transfers sit <> stand with supervision PT Short Term Goal 3 (Week 2): Pt will verbalize 3/3 hip precautions with no verbal cues  Skilled Therapeutic Interventions/Progress Updates:    This session focused on bed mobility supine<->sit with modified independence (extra time needed, but bed flat an no railing).  Transfers bed <-> WC supervision with RW.  WC mobility very slow, but 150' with mod I over controlled indoor and carpeted surfaces.  Gait with RW over carpeted and tiled surfaces max distance 43' with RW TDWB R leg is limiting her ability to go further as her arms fatigue quickly. Car transfer supervision with RW.  Verbal cues for strategies to make it easier and to keep her hip precautions.    Therapy Documentation Precautions:  Precautions Precautions: Posterior Hip;Fall Precaution Booklet Issued: No Precaution Comments: bil posterior hip precautions.  Pt able to report 3/3 hip precautions with min cues.  Pt able to report TDWB status independently.   Restrictions Weight Bearing Restrictions: Yes RLE Weight Bearing: Touchdown weight bearing Other Position/Activity Restrictions: OK for Hip ROM (within post prec), and no restrictions on knee ROM    Pain: Pain Assessment Pain Assessment: No/denies pain  See FIM for current functional status  Therapy/Group: Individual Therapy  Lurena Joiner B. Angelis Gates, PT, DPT [phone]   12/16/2012, 4:48 PM

## 2012-12-16 NOTE — Progress Notes (Signed)
Social Work Patient ID: Renee Adams, female   DOB: 24-Feb-1934, 79 y.o.   MRN: [id number]  Have discussed team conference with pt and daughter.  Feeling ready for d/c tomorrow after family ed completed.  Jerimah Witucki, LCSW

## 2012-12-16 NOTE — Progress Notes (Signed)
ANTICOAGULATION CONSULT NOTE - Follow Up Consult  Pharmacy Consult for coumadin Indication: ASD/TIA s/p repair of femur fx  Allergies  Allergen Reactions  . Dilaudid (Hydromorphone Hcl) Shortness Of Breath    Tolerates tramadol  . Morphine And Related Shortness Of Breath    Tolerates tramadol  . Sulfa Antibiotics Other (See Comments)    "Made me drunk";  wobbly  . Keflex (Cephalexin) Nausea Only  . Levaquin (Levofloxacin) Hives and Rash    Patient Measurements: Weight: 152 lb 6.4 oz (69.128 kg) Heparin Dosing Weight:   Vital Signs: Temp: 98.3 F (36.8 C) (06/10 0552) Temp src: Oral (06/10 0552) BP: 117/73 mmHg (06/10 0552) Pulse Rate: 92 (06/10 0552)  Labs:  Recent Labs  12/14/12 0600 12/15/12 0520 12/16/12 0538  LABPROT 20.4* 24.5* 31.2*  INR 1.82* 2.33* 3.23*    The CrCl is unknown because both a height and weight (above a minimum accepted value) are required for this calculation.   Medications:  Scheduled:  . acetaminophen  500 mg Oral BID WC  . amLODipine  5 mg Oral Daily  . atorvastatin  10 mg Oral Daily  . buPROPion  300 mg Oral Daily  . calcium carbonate  1,250 mg Oral BID WC  . lactose free nutrition  120 mL Oral TID WC & HS  . latanoprost  1 drop Both Eyes QHS  . sertraline  150 mg Oral Daily  . traMADol  50 mg Oral Q12H  . Warfarin - Pharmacist Dosing Inpatient   Does not apply q1800   Infusions:    Assessment: 79 yo female with ASD/TIA s/p repair of femur fx is currently on supratherapeutic coumadin.  INR jumped from 2.33 to 3.23 probably due tot he 5mg  she received few days ago. Goal of Therapy:  INR 2-3 Monitor platelets by anticoagulation protocol: Yes   Plan:  1) d/c standing order of coumadin.  Coumadin 0.5 mg po x1 tonight 2) Change INR to daily. 3) If get discharge today, rec to check INR before dosing coumadin tomorrow.  Ryheem Jay, Tsz-Yin 12/16/2012,8:05 AM

## 2012-12-16 NOTE — Progress Notes (Signed)
Physical Therapy Session Note  Patient Details  Name: ADDYLIN MANKE MRN: [id number] Date of Birth: 1933-12-31  Today's Date: 12/16/2012 Time: 1445-1500 Time Calculation (min): 15 min  Skilled Therapeutic Interventions/Progress Updates:    Pt resting in bed at start. Pt able to transfer supine>sit with modified independence and maintaining hip precautions. Discussed D/C tomorrow and family education tomorrow. Scoot transfer bed to wheelchair with supervision, cues for sequencing and pt able to maintain TDWB precautions without need for cues during this transfer. Wheelchair propulsion in room negotiating obstacles with increased time but able to perform without cues, controlled environment propulsion x 150' with modified independence and increased time. Pt ready for next therapist in gym.   Therapy Documentation Precautions:  Precautions Precautions: Posterior Hip;Fall Precaution Booklet Issued: No Precaution Comments: bil posterior hip precautions.  Pt able to report 3/3 hip precautions with min cues.  Pt able to report TDWB status independently.   Restrictions Weight Bearing Restrictions: Yes RLE Weight Bearing: Touchdown weight bearing Other Position/Activity Restrictions: OK for Hip ROM (within post prec), and no restrictions on knee ROM Pain: Pain Assessment Pain Assessment: No/denies pain  See FIM for current functional status  Therapy/Group: Individual Therapy  Wilhemina Bonito 12/16/2012, 3:53 PM

## 2012-12-16 NOTE — Patient Care Conference (Signed)
Inpatient RehabilitationTeam Conference and Plan of Care Update Date: 12/16/2012   Time: 2:00 PM    Patient Name: Renee Adams      Medical Record Number: [id number]  Date of Birth: [birth year] Sex: Female         Room/Bed: 4003/4003-01 Payor Info: Payor: MEDICARE / Plan: MEDICARE PART A AND B / Product Type: *No Product type* /    Admitting Diagnosis: fem fx  Admit Date/Time:  12/02/2012  5:57 PM Admission Comments: No comment available   Primary Diagnosis:  Fracture of femoral neck, right Principal Problem: Fracture of femoral neck, right  Patient Active Problem List   Diagnosis Date Noted  . Fracture of femoral neck, right 12/03/2012  . Fall 11/26/2012  . Fracture of femoral shaft, right, closed 11/26/2012  . UTI (urinary tract infection) 11/26/2012  . Essential hypertension, benign 08/18/2012  . Status post left hip replacement 04/18/2012  . Osteomyelitis of left hip 03/31/2012  . Breast cancer, left breast 09/12/2011    Expected Discharge Date: Expected Discharge Date: 12/17/12  Team Members Present: Physician leading conference: Dr. Claudette Laws Social Worker Present: Amada Jupiter, LCSW Nurse Present: Carmie End, RN PT Present: Other (comment) Sherrine Maples, PT) OT Present: Mackie Pai, OT;Patricia Mat Carne, OT Other (Discipline and Name): Tora Duck, PPS Coordinator and Ottie Glazier, RN     Current Status/Progress Goal Weekly Team Focus  Medical   medial femoral fracture, anxiety  improve mobuility with new R side WB restrictions  see above   Bowel/Bladder   continent bowel and bladder llbm 6/9  remain continent  monitor   Swallow/Nutrition/ Hydration             ADL's   UB dressigin - supervision, LB dressing min A, toilet transfers/toileting - supervision, bathing supervision/min A  UB dressing (independent), LB dressing (min A), toileting (Mod I), bathing (supervision), tub transfers (min A), dynamic standing (superivision)  dynamic standing  balance, functional ambulation/transfers, activity tolerance   Mobility   supevision  supervision  family education, precautions, ambulation   Communication             Safety/Cognition/ Behavioral Observations            Pain   scheduled tylenol and ultram, prn vicodin for pain  3 or less 1-10 scale  monitor effectviness of pain meds   Skin   r hip incision with steris, staples removed today  no new breakdown   monitor incision and for skin breakdown, use protective skin care products      *See Care Plan and progress notes for long and short-term goals.  Barriers to Discharge: new WB restrictions    Possible Resolutions to Barriers:  extend stay    Discharge Planning/Teaching Needs:  home with daughter who plans to stay and provide 24/7 assistance      Team Discussion:  LOS extended x one day due to downgrade in weight-bearing status and need for further instruction. Ready for d/c tomorrow following family education.  Revisions to Treatment Plan:  Downgrade of WB to TDWB which changed overall goals to some minimal assistance.  Also extended LOS x 1 day.   Continued Need for Acute Rehabilitation Level of Care: The patient requires daily medical management by a physician with specialized training in physical medicine and rehabilitation for the following conditions: Daily direction of a multidisciplinary physical rehabilitation program to ensure safe treatment while eliciting the highest outcome that is of practical value to the patient.: Yes Daily medical management of  patient stability for increased activity during participation in an intensive rehabilitation regime.: Yes Daily analysis of laboratory values and/or radiology reports with any subsequent need for medication adjustment of medical intervention for : Post surgical problems  Miria Cappelli 12/16/2012, 4:28 PM

## 2012-12-16 NOTE — Progress Notes (Signed)
Physical Therapy Session Note  Patient Details  Name: Renee Adams MRN: [id number] Date of Birth: 23-Mar-1934  Today's Date: 12/16/2012 Time: 1330-1400             1500-1530 Time Calculation (min): 30 min                                               30 min  Short Term Goals: Week 2:  PT Short Term Goal 1 (Week 2): Pt will ambulate x 50' with min assist PT Short Term Goal 2 (Week 2): Pt will perform transfers sit <> stand with supervision PT Short Term Goal 3 (Week 2): Pt will verbalize 3/3 hip precautions with no verbal cues  Skilled Therapeutic Interventions/Progress Updates:   First session:  Pt performed wheelchair mobility with min vcs to improve locomotion x 50 feet, sit<>stand from wheelchair to enforce and practice maintaining TDWB status, pt conts to display with toe touch and seems to be able to maintain WB status by keeping foot off the floor. Pt seen for gait training with 2ww TDWB wheelchair behind and CGA, pt able to tolerate approximately 10-12 feet with hopping with use of RW and totalled 45 feet, pt does suddenly fatigue and received pt education on safety with all mobility.   Second session: Pt seen for transfer training w/c<>mat table and w/c<>bed with scoot pivot technique to improve pt performance when pt is fatigued and unable to tolerate transfers with RW. Pt displays good understanding of technique, pt refused gait this session due to fatigue.  Pt performed supine there ex for heel slides and abduction 2 x 10 reps. Per PT pt is to be discharged home tomorrow with dtr and will receive pt and family education prior to discharge.  Therapy Documentation Precautions:  Precautions Precautions: Posterior Hip;Fall Precaution Booklet Issued: No Precaution Comments: bil posterior hip precautions.  Pt able to report 3/3 hip precautions with min cues.  Pt able to report TDWB status independently.   Restrictions Weight Bearing Restrictions: Yes RLE Weight Bearing: Touchdown  weight bearing Other Position/Activity Restrictions: OK for Hip ROM (within post prec), and no restrictions on knee ROM      Pain: Pt reports 1-2/10 painscore stating she had pain meds prior to therapy first session.  Second session pt reported no pain but c/o fatigue.         :           See FIM for current functional status  Therapy/Group: Individual Therapy session 1 and session 2   Jackelyn Knife 12/16/2012, 4:19 PM

## 2012-12-16 NOTE — Progress Notes (Signed)
Occupational Therapy Session Note  Patient Details  Name: Renee Adams MRN: [id number] Date of Birth: Oct 24, 1933  Today's Date: 12/16/2012 Time: 1030-1130 Time Calculation (min): 60 min  Short Term Goals: Week 2:  OT Short Term Goal 1 (Week 2):  (STG=LTG secondary to ELOS)  Skilled Therapeutic Interventions/Progress Updates:  Self care retraining to include shower, dress and groom.  Focused session on walker safety during functional mobility, gather clothes prior to shower, shower transfer, sit><stands, standing tolerance and balance, adhering to hip precautions and TDWB precautions.  Anticipate patient may have to sponge bathe at discharge if unable to back into shower with a one legged hop over shower ledge then hop back out.  Goal is Min assist yet unsure patient has strength and endurance to perform this maneuver.  Patient reports that daughter arrives in town this afternoon around 3pm.  Therapy Documentation Precautions:  Precautions Precautions: Posterior Hip;Fall Precaution Booklet Issued: No Precaution Comments: bil posterior hip precautions.  Pt able to report 3/3 hip precautions with min cues.  Pt able to report TDWB status independently.   Restrictions Weight Bearing Restrictions: Yes RLE Weight Bearing: Touchdown weight bearing Other Position/Activity Restrictions: OK for Hip ROM (within post prec), and no restrictions on knee ROM Pain: 4/10 left groin, rest, repositioned, RN notified and medication provided ADL: See FIM for current functional status  Therapy/Group: Individual Therapy  Ival Pacer 12/16/2012, 12:29 PM

## 2012-12-16 NOTE — Progress Notes (Signed)
Social Work Patient ID: Louann Sjogren, female   DOB: 01-May-1934, 79 y.o.   MRN: [id number]   Amada Jupiter, LCSW Social Worker Signed  Patient Care Conference Service date: 12/16/2012 4:28 PM  Inpatient RehabilitationTeam Conference and Plan of Care Update Date: 12/16/2012   Time: 2:00 PM     Patient Name: Renee Adams       Medical Record Number: [id number]   Date of Birth: 1934-06-17 Sex: Female         Room/Bed: 4003/4003-01 Payor Info: Payor: MEDICARE / Plan: MEDICARE PART A AND B / Product Type: *No Product type* /   Admitting Diagnosis: fem fx   Admit Date/Time:  12/02/2012  5:57 PM Admission Comments: No comment available   Primary Diagnosis:  Fracture of femoral neck, right Principal Problem: Fracture of femoral neck, right    Patient Active Problem List     Diagnosis  Date Noted   .  Fracture of femoral neck, right  12/03/2012   .  Fall  11/26/2012   .  Fracture of femoral shaft, right, closed  11/26/2012   .  UTI (urinary tract infection)  11/26/2012   .  Essential hypertension, benign  08/18/2012   .  Status post left hip replacement  04/18/2012   .  Osteomyelitis of left hip  03/31/2012   .  Breast cancer, left breast  09/12/2011     Expected Discharge Date: Expected Discharge Date: 12/17/12  Team Members Present: Physician leading conference: Dr. Claudette Laws Social Worker Present: Amada Jupiter, LCSW Nurse Present: Carmie End, RN PT Present: Other (comment) Sherrine Maples, PT) OT Present: Mackie Pai, OT;Patricia Mat Carne, OT Other (Discipline and Name): Tora Duck, PPS Coordinator and Ottie Glazier, RN        Current Status/Progress  Goal  Weekly Team Focus   Medical     medial femoral fracture, anxiety  improve mobuility with new R side WB restrictions  see above   Bowel/Bladder     continent bowel and bladder llbm 6/9  remain continent  monitor   Swallow/Nutrition/ Hydration            ADL's     UB dressigin - supervision, LB dressing min A,  toilet transfers/toileting - supervision, bathing supervision/min A  UB dressing (independent), LB dressing (min A), toileting (Mod I), bathing (supervision), tub transfers (min A), dynamic standing (superivision)  dynamic standing balance, functional ambulation/transfers, activity tolerance   Mobility     supevision  supervision  family education, precautions, ambulation   Communication            Safety/Cognition/ Behavioral Observations           Pain     scheduled tylenol and ultram, prn vicodin for pain  3 or less 1-10 scale  monitor effectviness of pain meds   Skin     r hip incision with steris, staples removed today  no new breakdown   monitor incision and for skin breakdown, use protective skin care products     *See Care Plan and progress notes for long and short-term goals.    Barriers to Discharge:  new WB restrictions      Possible Resolutions to Barriers:    extend stay      Discharge Planning/Teaching Needs:    home with daughter who plans to stay and provide 24/7 assistance      Team Discussion:    LOS extended x one day due to downgrade in weight-bearing status and need for  further instruction. Ready for d/c tomorrow following family education.   Revisions to Treatment Plan:    Downgrade of WB to TDWB which changed overall goals to some minimal assistance.  Also extended LOS x 1 day.    Continued Need for Acute Rehabilitation Level of Care: The patient requires daily medical management by a physician with specialized training in physical medicine and rehabilitation for the following conditions: Daily direction of a multidisciplinary physical rehabilitation program to ensure safe treatment while eliciting the highest outcome that is of practical value to the patient.: Yes Daily medical management of patient stability for increased activity during participation in an intensive rehabilitation regime.: Yes Daily analysis of laboratory values and/or radiology reports  with any subsequent need for medication adjustment of medical intervention for : Post surgical problems  Ronee Ranganathan 12/16/2012, 4:28 PM         Inpatient RehabilitationTeam Conference and Plan of Care Update Date: 12/09/2012   Time: 2:05 PM     Patient Name: Renee Adams       Medical Record Number: [id number]   Date of Birth: 07/19/1933 Sex: Female         Room/Bed: 4003/4003-01 Payor Info: Payor: MEDICARE / Plan: MEDICARE PART A AND B / Product Type: *No Product type* /   Admitting Diagnosis: fem fx   Admit Date/Time:  12/02/2012  5:57 PM Admission Comments: No comment available   Primary Diagnosis:  Fracture of femoral neck, right Principal Problem: Fracture of femoral neck, right    Patient Active Problem List     Diagnosis  Date Noted   .  Fracture of femoral neck, right  12/03/2012   .  Fall  11/26/2012   .  Fracture of femoral shaft, right, closed  11/26/2012   .  UTI (urinary tract infection)  11/26/2012   .  Essential hypertension, benign  08/18/2012   .  Status post left hip replacement  04/18/2012   .  Osteomyelitis of left hip  03/31/2012   .  Breast cancer, left breast  09/12/2011     Expected Discharge Date: Expected Discharge Date: 12/16/12  Team Members Present: Physician leading conference: Dr. Faith Rogue Social Worker Present: Amada Jupiter, LCSW Nurse Present: Other (comment) Keturah Barre, RN) PT Present: Karolee Stamps, PT;Other (comment) Sherrine Maples, PT) OT Present: Edwin Cap, Loistine Chance, OT Other (Discipline and Name): Ottie Glazier, RN;  Tora Duck, PPS Coordinator        Current Status/Progress  Goal  Weekly Team Focus   Medical     right per-prosthetic femur fx. anxiety better  pain control, wound care  see above, mental status   Bowel/Bladder     Continent of bowel and bladder. LBM 12/07/12. Experiences urinary urgency, per report  Pt to remain continent of bowel and bladder.  Monitor and adjust accordingly    Swallow/Nutrition/ Hydration            ADL's     UB dressing (min A), LB dressing (total A), toileting (max A), bathing (min A), tub transfers (steady A)  UB dressing (independent), LB dressing (supervision), toileting (Mod I), bathing (supervision), tub transfers (min A), dynamic standing (superivision)  dynamic standing balance/endurance without UE support, fxal mobility/transfers, BADL's   Mobility     min assist  supervision  safety with mobility, endurance, strength, pain management   Communication            Safety/Cognition/ Behavioral Observations  Pain     Scheduled Tylenol 500mg  at breakfast and lunch. Vicodin 1-2 tab q 6hrs prn  <3  Offer pain medication 1 hour prior to initial therapy   Skin     R hip fx incision with miraplex dressing and steri intact  No additional skin breakdown  Monitor incision for appropriate healing    Rehab Goals Patient on target to meet rehab goals: Yes *See Care Plan and progress notes for long and short-term goals.    Barriers to Discharge:  safety, cognition      Possible Resolutions to Barriers:    supervision at home. supervision goals      Discharge Planning/Teaching Needs:    Home with daughter to stay and provide 24/7 assistance      Team Discussion:    Have decreased pain meds with improved alertness and improving cognition. Still showing some impaired safety awareness and plan to change to supervision.  SW to follow up with family about likely need for about a month of supervision at home.   Revisions to Treatment Plan:    Overall goals changed to supervision.    Continued Need for Acute Rehabilitation Level of Care: The patient requires daily medical management by a physician with specialized training in physical medicine and rehabilitation for the following conditions: Daily direction of a multidisciplinary physical rehabilitation program to ensure safe treatment while eliciting the highest outcome that is of  practical value to the patient.: Yes Daily medical management of patient stability for increased activity during participation in an intensive rehabilitation regime.: Yes Daily analysis of laboratory values and/or radiology reports with any subsequent need for medication adjustment of medical intervention for : Neurological problems;Other;Post surgical problems  Krishiv Sandler 12/09/2012, 2:36 PM

## 2012-12-16 NOTE — Progress Notes (Signed)
Patient ID: Renee Adams, female   DOB: [birth year], 79 y.o.   MRN: [id number] Subjective/Complaints: No new c/os A 12 point review of systems has been performed and if not noted above is otherwise negative.   Objective: Vital Signs: Blood pressure 117/73, pulse 92, temperature 98.3 F (36.8 C), temperature source Oral, resp. rate 17, weight 69.128 kg (152 lb 6.4 oz), SpO2 92.00%. No results found. No results found for this basename: WBC, HGB, HCT, PLT,  in the last 72 hours No results found for this basename: NA, K, CL, CO, GLUCOSE, BUN, CREATININE, CALCIUM,  in the last 72 hours CBG (last 3)  No results found for this basename: GLUCAP,  in the last 72 hours  Wt Readings from Last 3 Encounters:  12/10/12 69.128 kg (152 lb 6.4 oz)  11/28/12 63.504 kg (140 lb)  11/28/12 63.504 kg (140 lb)    Physical Exam:  Constitutional: She is oriented to person, place, and time. She appears well-developed and well-nourished. Hard of hearing HENT:  Head: Normocephalic and atraumatic.  Right Ear: External ear normal.  Left Ear: External ear normal.  Eyes: Conjunctivae and EOM are normal. Pupils are equal, round, and reactive to light. Right eye exhibits no discharge. Left eye exhibits no discharge. No scleral icterus.  Neck: Normal range of motion. Neck supple. No JVD present. No tracheal deviation present. No thyromegaly present.  Cardiovascular: Normal rate and regular rhythm. Exam reveals no friction rub.  No murmur heard.  Pulmonary/Chest: Effort normal and breath sounds normal. No respiratory distress. She has no wheezes.  Abdominal: Soft. Bowel sounds are normal. She exhibits no distension and no mass. There is no tenderness. There is no rebound and no guarding.  Musculoskeletal:    Joint deformities in the hands.  Lymphadenopathy:  She has no cervical adenopathy.  Neurological: She is alert and oriented to person, place, and time. UE is 4/5. RLE is 2-3/5 (pain) proximally to 4-/5  distally. LLE is 4/5 proximally to distally.  Skin: No rash noted. No erythema.  Surgical site clean and dry with staples. Decreased bruising and swelling around incision site. Right thigh is not tender or swollen Psychiatric: She has a normal mood and affect. Her behavior is normal. Judgment and thought content normal    Assessment/Plan: 1. Functional deficits secondary to right peri-prosthetic femoral shaft fx which require 3+ hours per day of interdisciplinary therapy in a comprehensive inpatient rehab setting. D/C staples Ready for D/C in am. FIM: FIM - Bathing Bathing Steps Patient Completed: Chest;Right Arm;Left Arm;Abdomen;Front perineal area;Buttocks;Right upper leg;Left upper leg;Right lower leg (including foot);Left lower leg (including foot) Bathing: 6: Assistive device (Comment) (LH sponge)  FIM - Upper Body Dressing/Undressing Upper body dressing/undressing steps patient completed: Thread/unthread right bra strap;Thread/unthread left bra strap;Thread/unthread right sleeve of pullover shirt/dresss;Thread/unthread left sleeve of pullover shirt/dress;Put head through opening of pull over shirt/dress;Pull shirt over trunk Upper body dressing/undressing: 4: Min-Patient completed 75 plus % of tasks FIM - Lower Body Dressing/Undressing Lower body dressing/undressing steps patient completed: Thread/unthread right underwear leg;Thread/unthread left underwear leg;Pull underwear up/down;Thread/unthread right pants leg;Thread/unthread left pants leg;Pull pants up/down Lower body dressing/undressing: 4: Min-Patient completed 75 plus % of tasks (used reacher for LB dressing)  FIM - Toileting Toileting steps completed by patient: Adjust clothing prior to toileting;Performs perineal hygiene;Adjust clothing after toileting Toileting Assistive Devices: Grab bar or rail for support Toileting: 0: Activity did not occur  FIM - Diplomatic Services operational officer Devices: Elevated toilet  seat;Grab bars Toilet Transfers: 0-Activity  did not occur  FIM - Press photographer Assistive Devices: Arm rests Bed/Chair Transfer: 6: Supine > Sit: No assist;5: Sit > Supine: Supervision (verbal cues/safety issues);5: Bed > Chair or W/C: Supervision (verbal cues/safety issues);5: Chair or W/C > Bed: Supervision (verbal cues/safety issues)  FIM - Locomotion: Wheelchair Distance: 118 Locomotion: Wheelchair: 5: Travels 150 ft or more: maneuvers on rugs and over door sills with supervision, cueing or coaxing FIM - Locomotion: Ambulation Locomotion: Ambulation Assistive Devices: Designer, industrial/product Ambulation/Gait Assistance: 5: Supervision Locomotion: Ambulation: 1: Travels less than 50 ft with supervision/safety issues  Comprehension Comprehension Mode: Auditory Comprehension: 5-Understands basic 90% of the time/requires cueing < 10% of the time  Expression Expression Mode: Verbal Expression: 6-Expresses complex ideas: With extra time/assistive device  Social Interaction Social Interaction: 5-Interacts appropriately 90% of the time - Needs monitoring or encouragement for participation or interaction.  Problem Solving Problem Solving: 5-Solves basic 90% of the time/requires cueing < 10% of the time  Memory Memory: 5-Recognizes or recalls 90% of the time/requires cueing < 10% of the time  Medical Problem List and Plan:  1. Right periprosthetic femoral shaft fracture. Medial prox femur fx, TDWB Status post ORIF 12/01/2012.  2. DVT Prophylaxis/Anticoagulation: Chronic Coumadin therapy. INR therapeutic. 3. Pain Management: Hydrocodone and Robaxin as needed.  -schedule tylenol for now  -will need to minimize narcotic use, increase tramadol to BID  -utilize local remedies for pain also including heat and ice which we reviewed today  -  -try one scheduled ultram qam 4. Mood: Xanax 1 mg every 4 hours as needed. Wellbutrin 300 mg daily, Zoloft 150 mg daily. Provide  emotional support  -scheduled xanax qd  5. Neuropsych: This patient is capable of making decisions on her own behalf.  6. Hypertension. Norvasc 5 mg daily. Monitor for the effects of increased mobility and pain upon her BP.  7. Hyperlipidemia. Lipitor  8. 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 Urinary incontinence.   -is seen by urology as an outpt-- UDS was planned  -ua neg, culture negative  LOS (Days) 14 A FACE TO FACE EVALUATION WAS PERFORMED  Claudette Laws E 12/16/2012 9:32 AM

## 2012-12-17 ENCOUNTER — Inpatient Hospital Stay (HOSPITAL_COMMUNITY): Payer: Medicare Other | Admitting: Physical Therapy

## 2012-12-17 ENCOUNTER — Encounter (HOSPITAL_COMMUNITY): Payer: Medicare Other | Admitting: Occupational Therapy

## 2012-12-17 LAB — PROTIME-INR: Prothrombin Time: 29.6 seconds — ABNORMAL HIGH (ref 11.6–15.2)

## 2012-12-17 MED ORDER — METHOCARBAMOL 500 MG PO TABS: 500.0000 mg | ORAL_TABLET | Freq: Four times a day (QID) | ORAL | Status: DC | PRN

## 2012-12-17 MED ORDER — CALCIUM CARBONATE 200 MG PO CAPS: 600.0000 mg | ORAL_CAPSULE | Freq: Two times a day (BID) | ORAL | Status: DC

## 2012-12-17 MED ORDER — ATORVASTATIN CALCIUM 10 MG PO TABS: 10.0000 mg | ORAL_TABLET | Freq: Every day | ORAL | Status: DC

## 2012-12-17 MED ORDER — ROPINIROLE HCL 1 MG PO TABS: 1.0000 mg | ORAL_TABLET | Freq: Every evening | ORAL | Status: AC | PRN

## 2012-12-17 MED ORDER — ALPRAZOLAM 0.25 MG PO TABS: 1.0000 mg | ORAL_TABLET | ORAL | Status: DC | PRN

## 2012-12-17 MED ORDER — SERTRALINE HCL 100 MG PO TABS: 150.0000 mg | ORAL_TABLET | Freq: Every day | ORAL | Status: AC

## 2012-12-17 MED ORDER — AMLODIPINE BESYLATE 5 MG PO TABS: 5.0000 mg | ORAL_TABLET | Freq: Every day | ORAL | Status: DC

## 2012-12-17 MED ORDER — HYDROCODONE-ACETAMINOPHEN 5-325 MG PO TABS
1.0000 | ORAL_TABLET | Freq: Four times a day (QID) | ORAL | Status: DC | PRN
Start: 2012-12-17 — End: 2014-09-23

## 2012-12-17 MED ORDER — LATANOPROST 0.005 % OP SOLN: 1.0000 [drp] | Freq: Every day | OPHTHALMIC | Status: DC

## 2012-12-17 MED ORDER — BUPROPION HCL ER (XL) 300 MG PO TB24: 300.0000 mg | ORAL_TABLET | Freq: Every day | ORAL | Status: DC

## 2012-12-17 MED ORDER — WARFARIN SODIUM 2 MG PO TABS
2.0000 mg | ORAL_TABLET | Freq: Once | ORAL | Status: DC
  Filled 2012-12-17: qty 1

## 2012-12-17 MED ORDER — WARFARIN SODIUM 2 MG PO TABS: 2.0000 mg | ORAL_TABLET | Freq: Once | ORAL | Status: DC

## 2012-12-17 NOTE — Progress Notes (Signed)
Social Work  Discharge Note  The overall goal for the admission was met for:   Discharge location: Yes  Length of Stay: No - extended LOS by 1 day to allow education on change in WB status  Discharge activity level: Yes - minimal assistance  Home/community participation: Yes  Services provided included: MD, RD, PT, OT, RN, TR, Pharmacy and SW  Financial Services: Medicare  Follow-up services arranged: Home Health: RN, PT, OT via [facility], DME: [phone] [place] w/c with ELRs and basic cushion via [facility] and Patient/Family has no preference for HH/DME agencies  Comments (or additional information):  Patient/Family verbalized understanding of follow-up arrangements: Yes  Individual responsible for coordination of the follow-up plan: patient  Confirmed correct DME delivered: Bernita Beckstrom 12/17/2012    Massa Pe

## 2012-12-17 NOTE — Progress Notes (Signed)
Physical Therapy Session Note  Patient Details  Name: Renee Adams MRN: [id number] Date of Birth: 06-09-1934  Today's Date: 12/17/2012 Time: 1000-1045 Time Calculation (min): 45 min  Short Term Goals: Week 2:  PT Short Term Goal 1 (Week 2): Pt will ambulate x 50' with min assist PT Short Term Goal 2 (Week 2): Pt will perform transfers sit <> stand with supervision PT Short Term Goal 3 (Week 2): Pt will verbalize 3/3 hip precautions with no verbal cues  Skilled Therapeutic Interventions/Progress Updates:   Pt seen for pt and family education, pt dtr present to prepare for discharge as she will be pt's caretaker upon return to home. Pt able to recall posterior hip precautions 3 out of 3 trials and dtr verbally acknowledged understanding.  Pt appeared anxious regarding discharge and required encouragement and reassurance to complete education.Marland Kitchen Pt performed gait training  RW TDWB with therapist assist with demonstration of technique to dtr x 12 feet and with dtr then performing return demo with pt 10 feet, sit to stand and transfers to various surfaces with maintenance of TDWB, step up backwards with use of RW, ramp training with RW and w/c, car transfers and stand and squat pivot transfers with wheelchair.  Dtr and pt acknowledged understanding of education provided with therapist utilizing teach back method with pt and dtr verbalizing instructions provided for safe transfers and all functional mobility with use of RW and wheelchair. Pt will return to home with dtr and will receive home health therapy.    Therapy Documentation Precautions:  Precautions Precautions: Posterior Hip;Fall Precaution Booklet Issued: No Precaution Comments: bil posterior hip precautions.  Pt able to report 3/3 hip precautions with min cues.  Pt able to report TDWB status independently.   Restrictions Weight Bearing Restrictions: Yes RLE Weight Bearing: Touchdown weight bearing Other Position/Activity Restrictions:  OK for Hip ROM (within post prec), and no restrictions on knee ROM       Pain: Pt reports 2/10 painscore R LE                     See FIM for current functional status  Therapy/Group: Individual Therapy  Jackelyn Knife 12/17/2012, 4:52 PM

## 2012-12-17 NOTE — Progress Notes (Signed)
Pt discharged home with daughter, Dan Finland PA provided discharge instructions, pt and daughter both verbalize an understanding and denied any questions or concerns

## 2012-12-17 NOTE — Progress Notes (Signed)
ANTICOAGULATION CONSULT NOTE - Follow Up Consult  Pharmacy Consult for coumadin Indication: ASD/TIA s/p repair of femur fx  Allergies  Allergen Reactions  . Dilaudid (Hydromorphone Hcl) Shortness Of Breath    Tolerates tramadol  . Morphine And Related Shortness Of Breath    Tolerates tramadol  . Sulfa Antibiotics Other (See Comments)    "Made me drunk";  wobbly  . Keflex (Cephalexin) Nausea Only  . Levaquin (Levofloxacin) Hives and Rash    Patient Measurements: Weight: 152 lb 6.4 oz (69.128 kg) Heparin Dosing Weight:   Vital Signs: Temp: 98 F (36.7 C) (06/11 0538) Temp src: Oral (06/11 0538) BP: 132/67 mmHg (06/11 0538) Pulse Rate: 87 (06/11 0538)  Labs:  Recent Labs  12/15/12 0520 12/16/12 0538 12/17/12 0605  LABPROT 24.5* 31.2* 29.6*  INR 2.33* 3.23* 3.01*    The CrCl is unknown because both a height and weight (above a minimum accepted value) are required for this calculation.   Medications:  Scheduled:  . acetaminophen  500 mg Oral BID WC  . amLODipine  5 mg Oral Daily  . atorvastatin  10 mg Oral Daily  . buPROPion  300 mg Oral Daily  . calcium carbonate  1,250 mg Oral BID WC  . lactose free nutrition  120 mL Oral TID WC & HS  . latanoprost  1 drop Both Eyes QHS  . sertraline  150 mg Oral Daily  . traMADol  50 mg Oral Q12H  . Warfarin - Pharmacist Dosing Inpatient   Does not apply q1800   Infusions:    Assessment: 79 yo female with ASD/TIA s/p repair of femur fx is currently on supratherapeutic coumadin.  INR down nice to 3.01 from 3.23 after coumadin 0.5mg  last night. Goal of Therapy:  INR 2-3 Monitor platelets by anticoagulation protocol: Yes   Plan:  1. Coumadin 2mg  po x1 2. Daily INR 3 If get discharge today, rec to check INR before dosing coumadin tomorrow.    Kameryn Davern, Tsz-Yin 12/17/2012,8:16 AM

## 2012-12-17 NOTE — Plan of Care (Signed)
Problem: RH Dressing Goal: LTG Patient will perform upper body dressing (OT) LTG Patient will perform upper body dressing with assist, with/without cues (OT).  Outcome: Not Met (add Reason) Patient requires set up or assist to set herself up with UB dressing task.  Problem: RH Toileting Goal: LTG Patient will perform toileting w/assist, cues/equip (OT) LTG: Patient will perform toiletiing (clothes management/hygiene) with assist, with/without cues using equipment (OT)  Outcome: Not Met (add Reason) Patient requires supervision-min assist for toileting tasks.  Patient's level varies due to change in WB status to TDWB, fatigue, and level of anxiety.  Problem: RH Toilet Transfers Goal: LTG Patient will perform toilet transfers w/assist (OT) LTG: Patient will perform toilet transfers with assist, with/without cues using equipment (OT)  Outcome: Not Met (add Reason) Patient requires supervision - min assist with stand step or with ambulate with RW to use the toilet due to change in WB status to TDWB, fatigue and level of anxiety.  Problem: RH Tub/Shower Transfers Goal: LTG Patient will perform tub/shower transfers w/assist (OT) LTG: Patient will perform tub/shower transfers with assist, with/without cues using equipment (OT)  Outcome: Not Applicable Date Met:  12/17/12 Patient and daughter agree that due to change in WB status to TDWB, sponge bath will be safer than hopping with one leg over a 6-8"shower ledge!

## 2012-12-17 NOTE — Progress Notes (Signed)
Patient ID: Renee Adams, female   DOB: August 23, 1933, 79 y.o.   MRN: [id number] Subjective/Complaints: No new c/os A 12 point review of systems has been performed and if not noted above is otherwise negative.   Objective: Vital Signs: Blood pressure 132/67, pulse 87, temperature 98 F (36.7 C), temperature source Oral, resp. rate 20, weight 69.128 kg (152 lb 6.4 oz), SpO2 95.00%. No results found. No results found for this basename: WBC, HGB, HCT, PLT,  in the last 72 hours No results found for this basename: NA, K, CL, CO, GLUCOSE, BUN, CREATININE, CALCIUM,  in the last 72 hours CBG (last 3)  No results found for this basename: GLUCAP,  in the last 72 hours  Wt Readings from Last 3 Encounters:  12/10/12 69.128 kg (152 lb 6.4 oz)  11/28/12 63.504 kg (140 lb)  11/28/12 63.504 kg (140 lb)    Physical Exam:  Constitutional: She is oriented to person, place, and time. She appears well-developed and well-nourished. Hard of hearing HENT:  Head: Normocephalic and atraumatic.  Right Ear: External ear normal.  Left Ear: External ear normal.  Eyes: Conjunctivae and EOM are normal. Pupils are equal, round, and reactive to light. Right eye exhibits no discharge. Left eye exhibits no discharge. No scleral icterus.  Neck: Normal range of motion. Neck supple. No JVD present. No tracheal deviation present. No thyromegaly present.  Cardiovascular: Normal rate and regular rhythm. Exam reveals no friction rub.  No murmur heard.  Pulmonary/Chest: Effort normal and breath sounds normal. No respiratory distress. She has no wheezes.  Abdominal: Soft. Bowel sounds are normal. She exhibits no distension and no mass. There is no tenderness. There is no rebound and no guarding.  Musculoskeletal:    Joint deformities in the hands.  Lymphadenopathy:  She has no cervical adenopathy.  Neurological: She is alert and oriented to person, place, and time. UE is 4/5. RLE is 2-3/5 (pain) proximally to 4-/5 distally.  LLE is 4/5 proximally to distally.  Skin: No rash noted. No erythema.  Surgical site clean and dry with staples. Decreased bruising and swelling around incision site. Right thigh is not tender or swollen Psychiatric: She has a normal mood and affect. Her behavior is normal. Judgment and thought content normal    Assessment/Plan: 1. Functional deficits secondary to right peri-prosthetic femoral shaft fx which require 3+ hours per day of interdisciplinary therapy in a comprehensive inpatient rehab setting.  Staples out without issues Home today with HH follow up. i will see her back prn. FIM: FIM - Bathing Bathing Steps Patient Completed: Chest;Right Arm;Left Arm;Abdomen;Front perineal area;Buttocks;Right upper leg;Left upper leg;Right lower leg (including foot);Left lower leg (including foot) Bathing: 6: Assistive device (Comment) (LH sponge)  FIM - Upper Body Dressing/Undressing Upper body dressing/undressing steps patient completed: Thread/unthread right bra strap;Thread/unthread left bra strap;Thread/unthread right sleeve of pullover shirt/dresss;Thread/unthread left sleeve of pullover shirt/dress;Put head through opening of pull over shirt/dress;Pull shirt over trunk Upper body dressing/undressing: 5: Set-up assist to: Obtain clothing/put away FIM - Lower Body Dressing/Undressing Lower body dressing/undressing steps patient completed: Thread/unthread right underwear leg;Thread/unthread left underwear leg;Pull underwear up/down;Thread/unthread right pants leg;Thread/unthread left pants leg;Pull pants up/down;Don/Doff right sock;Don/Doff left sock;Don/Doff right shoe;Don/Doff left shoe Lower body dressing/undressing: 5: Set-up assist to: Don/Doff TED stocking  FIM - Toileting Toileting steps completed by patient: Adjust clothing prior to toileting;Performs perineal hygiene;Adjust clothing after toileting Toileting Assistive Devices: Grab bar or rail for support Toileting: 0: Activity did  not occur  FIM - Toilet Transfers  Museum/gallery curator Devices: Elevated toilet seat;Grab bars Toilet Transfers: 0-Activity did not occur  FIM - Banker Devices: Therapist, occupational: 6: Supine > Sit: No assist;6: Sit > Supine: No assist;5: Bed > Chair or W/C: Supervision (verbal cues/safety issues);5: Chair or W/C > Bed: Supervision (verbal cues/safety issues)  FIM - Locomotion: Wheelchair Distance: 150 Locomotion: Wheelchair: 6: Travels 150 ft or more, turns around, maneuvers to table, bed or toilet, negotiates 3% grade: maneuvers on rugs and over door sills independently FIM - Locomotion: Ambulation Locomotion: Ambulation Assistive Devices: Designer, industrial/product Ambulation/Gait Assistance: 5: Supervision Locomotion: Ambulation: 1: Travels less than 50 ft with supervision/safety issues  Comprehension Comprehension Mode: Auditory Comprehension: 5-Understands complex 90% of the time/Cues < 10% of the time  Expression Expression Mode: Verbal Expression: 6-Expresses complex ideas: With extra time/assistive device  Social Interaction Social Interaction: 6-Interacts appropriately with others with medication or extra time (anti-anxiety, antidepressant).  Problem Solving Problem Solving: 5-Solves complex 90% of the time/cues < 10% of the time  Memory Memory: 6-More than reasonable amt of time  Medical Problem List and Plan:  1. Right periprosthetic femoral shaft fracture. Medial prox femur fx, TDWB Status post ORIF 12/01/2012.   -follow up after dc with ortho 2. DVT Prophylaxis/Anticoagulation: Chronic Coumadin therapy. INR therapeutic. 3. Pain Management: Hydrocodone and Robaxin as needed.---pain better  -schedule tylenol for now  -will need to minimize narcotic use, increased tramadol to BID  -utilize local remedies for pain also including heat and ice which we reviewed today    4. Mood: Xanax 1 mg every 4 hours as needed.  Wellbutrin 300 mg daily, Zoloft 150 mg daily. Provide emotional support  -scheduled xanax qd has helped 5. Neuropsych: This patient is capable of making decisions on her own behalf.  6. Hypertension. Norvasc 5 mg daily. Monitor for the effects of increased mobility and pain upon her BP.  7. Hyperlipidemia. Lipitor  8. 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 Urinary incontinence.   -is seen by urology as an outpt-- UDS was planned  -ua neg, culture negative  LOS (Days) 15 A FACE TO FACE EVALUATION WAS PERFORMED  Thelton Graca T 12/17/2012 9:39 AM

## 2012-12-17 NOTE — Progress Notes (Signed)
Occupational Therapy Session Note  Patient Details  Name: Renee Adams MRN: [id number] Date of Birth: Jun 23, 1934  Today's Date: 12/17/2012 Time: 0900-1000 Time Calculation (min): 60 min  Short Term Goals: Week 2:  OT Short Term Goal 1 (Week 2):  (STG=LTG secondary to ELOS)  Skilled Therapeutic Interventions/Progress Updates:  Focus of session was self care retraining and caregiver education with patient's daughter [name].  Patient demonstrated gathering clothes using walker and ambulating to zero entry shower, shower transfer using grab bar and tub bench, bathing using LH sponge and stand to wash peri area and buttocks.  Patient transferred to toilet to get dressed stating that the toilet is where she will sit at home to get dressed.  Patient became very anxious and almost tearful when preparing to bathe stating that she did not like to be watched and remarked that she got really anxious yesterday when so many people were watching her in physical therapy yesterday.  Daughter reminded her that she was present to understand how her mom was doing and to find out what she needed to know to safley assist at home.  Patient stated that she understood.  Patient continued to be anxious and presented with less confidence for the remainder of the session which required more assistance.  Daughter stated that the shower stall at home has a 6-8" ledge to maneuver.  Daughter, patient and this OT feel that a sponge bath may be the best plan initially.  Therapy Documentation Precautions:  Precautions Precautions: Posterior Hip;Fall Precaution Booklet Issued: No Precaution Comments: bil posterior hip precautions.  Pt able to report 3/3 hip precautions with min cues.  Pt able to report TDWB status independently.   Restrictions Weight Bearing Restrictions: Yes RLE Weight Bearing: Touchdown weight bearing Other Position/Activity Restrictions: OK for Hip ROM (within post prec), and no restrictions on knee  ROM Pain: 2/10, premedicated, rest PRN ADL: See FIM for current functional status  Therapy/Group: Individual Therapy  Godric Lavell 12/17/2012, 12:32 PM

## 2012-12-17 NOTE — Progress Notes (Signed)
Physical Therapy Discharge Summary  Patient Details  Name: Renee Adams MRN: [id number] Date of Birth: 01/28/1934  Today's Date: 12/18/12 (late entry entered 12/18/12)    Patient has met 6 of 8 long term goals due to improved activity tolerance, improved balance, increased strength, increased range of motion and decreased pain.  Patient to discharge at an ambulatory level Supervision.   Patient's care partner is independent to provide the necessary physical assistance at discharge.  Reasons goals not met: The two gait goals were not met due to change in her WB status to TDWB.  She did not have the upper extremity strength or endurance to make it as far as the goals indicated.    Recommendation:  Patient will benefit from ongoing skilled PT services in home health setting to continue to advance safe functional mobility, address ongoing impairments in right leg strength, activity tolerance, safe mobility, ROM, balance, and minimize fall risk.  Equipment: WC  Reasons for discharge: discharge from hospital  Patient/family agrees with progress made and goals achieved: Yes  PT Discharge Precautions/Restrictions Precautions Precautions: Posterior Hip;Fall Precaution Booklet Issued: No Precaution Comments: bil posterior hip precautions.  Pt able to report 3/3 hip precautions with min cues.  Pt able to report TDWB status independently.   Restrictions Weight Bearing Restrictions: Yes RLE Weight Bearing: Touchdown weight bearing Other Position/Activity Restrictions: OK for Hip ROM (within post prec), and no restrictions on knee ROM   Vision/Perception  Vision - History Baseline Vision: Wears glasses all the time Patient Visual Report: No change from baseline Perception Perception: Within Functional Limits  Cognition Orientation Level: Oriented X4 Sensation Sensation Light Touch: Appears Intact (per pt repot no numbness and tingling in her extremities) Coordination Gross Motor  Movements are Fluid and Coordinated: Yes Fine Motor Movements are Fluid and Coordinated: Yes Motor  Motor Motor - Skilled Clinical Observations: generalized weakness Motor - Discharge Observations: continued generalized weakness, but improved from admission  Mobility Bed Mobility Supine to Sit: HOB flat;6: Modified independent (Device/Increase time) (no rails) Sitting - Scoot to Edge of Bed: 6: Modified independent (Device/Increase time) Sit to Supine: 6: Modified independent (Device/Increase time);HOB flat (no rail) Transfers Sit to Stand: 5: Supervision Stand to Sit: 5: Supervision Stand Pivot Transfers: 5: Supervision Locomotion  Ambulation Ambulation: Yes Ambulation/Gait Assistance: 5: Supervision Ambulation Distance (Feet): 36 Feet Assistive device: Rolling walker Ambulation/Gait Assistance Details: due to change in WB status pt unable to walk as far.  Her upper extremities fatigue and she needs to sit and rest.  Gait across carpeted and tiled surfaces.   Corporate treasurer: Yes Wheelchair Assistance: 5: Financial planner Details: Verbal cues for precautions/safety;Verbal cues for technique;Verbal cues for Copy: Both upper extremities Wheelchair Parts Management: Needs assistance (due to hip precautions) Distance: 150   Balance Static Sitting Balance Static Sitting - Balance Support: Feet supported;No upper extremity supported Static Sitting - Level of Assistance: 7: Independent Static Standing Balance Static Standing - Balance Support: Bilateral upper extremity supported Static Standing - Level of Assistance: 6: Modified independent (Device/Increase time) Dynamic Standing Balance Dynamic Standing - Balance Support: Right upper extremity supported;During functional activity Dynamic Standing - Level of Assistance: 5: Stand by assistance (while pulling pants up in standing) Extremity Assessment      RLE  Assessment RLE Assessment: Exceptions to [facility] RLE Strength RLE Overall Strength Comments: ankle 3+/5 DF, 4/5 PF, 3-/5 knee extension, 3+/5 knee flexion, 3-/5 hip flexion.   LLE Assessment LLE Assessment: Exceptions to [facility]  LLE Strength LLE Overall Strength Comments: 4/5 throughout  See FIM for current functional status  Lurena Joiner B. Shirlyn Savin, PT, DPT [phone]   12/17/2012, 12:15 PM

## 2012-12-18 DIAGNOSIS — I10 Essential (primary) hypertension: Secondary | ICD-10-CM | POA: Diagnosis not present

## 2012-12-18 DIAGNOSIS — F329 Major depressive disorder, single episode, unspecified: Secondary | ICD-10-CM | POA: Diagnosis not present

## 2012-12-18 DIAGNOSIS — Z5181 Encounter for therapeutic drug level monitoring: Secondary | ICD-10-CM | POA: Diagnosis not present

## 2012-12-18 DIAGNOSIS — I4891 Unspecified atrial fibrillation: Secondary | ICD-10-CM | POA: Diagnosis not present

## 2012-12-18 DIAGNOSIS — Z48816 Encounter for surgical aftercare following surgery on the genitourinary system: Secondary | ICD-10-CM | POA: Diagnosis not present

## 2012-12-18 NOTE — Progress Notes (Signed)
Occupational Therapy Discharge Summary  Patient Details  Name: Renee Adams MRN: [id number] Date of Birth: 12/28/1933  Today's Date: 12/18/2012  Patient has met 4 of 9 long term goals due to improved activity tolerance, improved balance and increased strength, and decreased pain.  Pt progress with bathing, dressing, toilet transfers, shower transfers, and toileting was inconsistent during this admission.  Pt's weight bearing status for RLE was changed from WBAT to TDWB.  Pt continues to fatigue easily and becomes anxions which directly impacts pt's ability to perform BADLs without assistance.  Pt's daughter has been present for education and will providing assistance after discharge. Patient to discharge at overall Supervision-Min assist level.  Patient's care partner is independent to provide the necessary physical and cognitive assistance at discharge.    Reasons goals not met: Patient's assist level varies with fatigue, pain, and anxiety.  Patient was progressing nicely toward LTG's until RLE weight bearing status was downgraded to TDWB.  Patient did not have the UE strength or endurance to maintain TDWB for any length of time.  Recommendation:  Patient will benefit from ongoing skilled OT services in home health setting to continue to advance functional skills and mobility during BADL, IADL tasks, activity tolerance, balance, safe mobility to minimize fall risk, and reduce care partner burden.  Equipment: No equipment providedPt owns shower seat and BSC  Reasons for discharge: discharge from hospital  Patient/family agrees with progress made and goals achieved: Yes  OT Discharge ADL ADL Eating: Independent Grooming: Independent Upper Body Bathing: Supervision/safety Lower Body Bathing: Supervision/safety Upper Body Dressing: Supervision/safety Lower Body Dressing: Minimal assistance Toileting: Contact guard Toilet Transfer: Close supervision Walk-In Shower Transfer: Close  supervision Vision/Perception  Vision - History Baseline Vision: Wears glasses all the time Patient Visual Report: No change from baseline Vision - Assessment Eye Alignment: Within Functional Limits Vision Assessment: Vision not tested Perception Perception: Within Functional Limits Praxis Praxis: Intact  Cognition Overall Cognitive Status: Within Functional Limits for tasks assessed Arousal/Alertness: Awake/alert Orientation Level: Oriented X4 Sensation Sensation Light Touch: Appears Intact Hot/Cold: Appears Intact Proprioception: Appears Intact Coordination Gross Motor Movements are Fluid and Coordinated: Yes Fine Motor Movements are Fluid and Coordinated: Yes Motor  Motor Motor - Skilled Clinical Observations: generalized weakness Motor - Discharge Observations: continued generalized weakness, but improved from admission Trunk/Postural Assessment  Cervical Assessment Cervical Assessment: Within Functional Limits Thoracic Assessment Thoracic Assessment: Within Functional Limits Lumbar Assessment Lumbar Assessment: Within Functional Limits Postural Control Postural Control: Within Functional Limits  Balance Static Sitting Balance Static Sitting - Balance Support: Feet supported;No upper extremity supported Static Sitting - Level of Assistance: 7: Independent Extremity/Trunk Assessment RUE Assessment RUE Assessment: Within Functional Limits LUE Assessment LUE Assessment: Within Functional Limits  See FIM for current functional status  Rich Brave 12/18/2012, 6:49 AM

## 2012-12-22 DIAGNOSIS — F329 Major depressive disorder, single episode, unspecified: Secondary | ICD-10-CM | POA: Diagnosis not present

## 2012-12-22 DIAGNOSIS — I10 Essential (primary) hypertension: Secondary | ICD-10-CM | POA: Diagnosis not present

## 2012-12-22 DIAGNOSIS — Z48816 Encounter for surgical aftercare following surgery on the genitourinary system: Secondary | ICD-10-CM | POA: Diagnosis not present

## 2012-12-22 DIAGNOSIS — I4891 Unspecified atrial fibrillation: Secondary | ICD-10-CM | POA: Diagnosis not present

## 2012-12-22 DIAGNOSIS — Z5181 Encounter for therapeutic drug level monitoring: Secondary | ICD-10-CM | POA: Diagnosis not present

## 2012-12-23 DIAGNOSIS — M84453D Pathological fracture, unspecified femur, subsequent encounter for fracture with routine healing: Secondary | ICD-10-CM | POA: Diagnosis not present

## 2012-12-24 DIAGNOSIS — F329 Major depressive disorder, single episode, unspecified: Secondary | ICD-10-CM | POA: Diagnosis not present

## 2012-12-24 DIAGNOSIS — Z48816 Encounter for surgical aftercare following surgery on the genitourinary system: Secondary | ICD-10-CM | POA: Diagnosis not present

## 2012-12-24 DIAGNOSIS — I10 Essential (primary) hypertension: Secondary | ICD-10-CM | POA: Diagnosis not present

## 2012-12-24 DIAGNOSIS — Z5181 Encounter for therapeutic drug level monitoring: Secondary | ICD-10-CM | POA: Diagnosis not present

## 2012-12-24 DIAGNOSIS — I4891 Unspecified atrial fibrillation: Secondary | ICD-10-CM | POA: Diagnosis not present

## 2012-12-25 DIAGNOSIS — I10 Essential (primary) hypertension: Secondary | ICD-10-CM | POA: Diagnosis not present

## 2012-12-25 DIAGNOSIS — K589 Irritable bowel syndrome without diarrhea: Secondary | ICD-10-CM | POA: Diagnosis not present

## 2012-12-25 DIAGNOSIS — G2589 Other specified extrapyramidal and movement disorders: Secondary | ICD-10-CM | POA: Diagnosis not present

## 2012-12-25 DIAGNOSIS — F329 Major depressive disorder, single episode, unspecified: Secondary | ICD-10-CM | POA: Diagnosis not present

## 2012-12-25 DIAGNOSIS — E559 Vitamin D deficiency, unspecified: Secondary | ICD-10-CM | POA: Diagnosis not present

## 2012-12-26 DIAGNOSIS — Z5181 Encounter for therapeutic drug level monitoring: Secondary | ICD-10-CM | POA: Diagnosis not present

## 2012-12-26 DIAGNOSIS — I4891 Unspecified atrial fibrillation: Secondary | ICD-10-CM | POA: Diagnosis not present

## 2012-12-26 DIAGNOSIS — I10 Essential (primary) hypertension: Secondary | ICD-10-CM | POA: Diagnosis not present

## 2012-12-26 DIAGNOSIS — Z48816 Encounter for surgical aftercare following surgery on the genitourinary system: Secondary | ICD-10-CM | POA: Diagnosis not present

## 2012-12-26 DIAGNOSIS — F329 Major depressive disorder, single episode, unspecified: Secondary | ICD-10-CM | POA: Diagnosis not present

## 2012-12-29 DIAGNOSIS — I10 Essential (primary) hypertension: Secondary | ICD-10-CM | POA: Diagnosis not present

## 2012-12-29 DIAGNOSIS — F329 Major depressive disorder, single episode, unspecified: Secondary | ICD-10-CM | POA: Diagnosis not present

## 2012-12-29 DIAGNOSIS — I4891 Unspecified atrial fibrillation: Secondary | ICD-10-CM | POA: Diagnosis not present

## 2012-12-29 DIAGNOSIS — Z5181 Encounter for therapeutic drug level monitoring: Secondary | ICD-10-CM | POA: Diagnosis not present

## 2012-12-29 DIAGNOSIS — Z48816 Encounter for surgical aftercare following surgery on the genitourinary system: Secondary | ICD-10-CM | POA: Diagnosis not present

## 2012-12-30 DIAGNOSIS — I4891 Unspecified atrial fibrillation: Secondary | ICD-10-CM | POA: Diagnosis not present

## 2012-12-30 DIAGNOSIS — E7800 Pure hypercholesterolemia, unspecified: Secondary | ICD-10-CM | POA: Diagnosis not present

## 2012-12-30 DIAGNOSIS — I10 Essential (primary) hypertension: Secondary | ICD-10-CM | POA: Diagnosis not present

## 2012-12-31 DIAGNOSIS — Z5181 Encounter for therapeutic drug level monitoring: Secondary | ICD-10-CM | POA: Diagnosis not present

## 2012-12-31 DIAGNOSIS — F329 Major depressive disorder, single episode, unspecified: Secondary | ICD-10-CM | POA: Diagnosis not present

## 2012-12-31 DIAGNOSIS — Z48816 Encounter for surgical aftercare following surgery on the genitourinary system: Secondary | ICD-10-CM | POA: Diagnosis not present

## 2012-12-31 DIAGNOSIS — H4011X0 Primary open-angle glaucoma, stage unspecified: Secondary | ICD-10-CM | POA: Diagnosis not present

## 2012-12-31 DIAGNOSIS — I10 Essential (primary) hypertension: Secondary | ICD-10-CM | POA: Diagnosis not present

## 2012-12-31 DIAGNOSIS — I4891 Unspecified atrial fibrillation: Secondary | ICD-10-CM | POA: Diagnosis not present

## 2013-01-01 DIAGNOSIS — F329 Major depressive disorder, single episode, unspecified: Secondary | ICD-10-CM | POA: Diagnosis not present

## 2013-01-01 DIAGNOSIS — I10 Essential (primary) hypertension: Secondary | ICD-10-CM | POA: Diagnosis not present

## 2013-01-01 DIAGNOSIS — Z48816 Encounter for surgical aftercare following surgery on the genitourinary system: Secondary | ICD-10-CM | POA: Diagnosis not present

## 2013-01-01 DIAGNOSIS — Z5181 Encounter for therapeutic drug level monitoring: Secondary | ICD-10-CM | POA: Diagnosis not present

## 2013-01-01 DIAGNOSIS — I4891 Unspecified atrial fibrillation: Secondary | ICD-10-CM | POA: Diagnosis not present

## 2013-01-02 DIAGNOSIS — I10 Essential (primary) hypertension: Secondary | ICD-10-CM | POA: Diagnosis not present

## 2013-01-02 DIAGNOSIS — F329 Major depressive disorder, single episode, unspecified: Secondary | ICD-10-CM | POA: Diagnosis not present

## 2013-01-02 DIAGNOSIS — Z5181 Encounter for therapeutic drug level monitoring: Secondary | ICD-10-CM | POA: Diagnosis not present

## 2013-01-02 DIAGNOSIS — I4891 Unspecified atrial fibrillation: Secondary | ICD-10-CM | POA: Diagnosis not present

## 2013-01-02 DIAGNOSIS — Z48816 Encounter for surgical aftercare following surgery on the genitourinary system: Secondary | ICD-10-CM | POA: Diagnosis not present

## 2013-01-05 DIAGNOSIS — Z48816 Encounter for surgical aftercare following surgery on the genitourinary system: Secondary | ICD-10-CM | POA: Diagnosis not present

## 2013-01-05 DIAGNOSIS — Z5181 Encounter for therapeutic drug level monitoring: Secondary | ICD-10-CM | POA: Diagnosis not present

## 2013-01-05 DIAGNOSIS — I10 Essential (primary) hypertension: Secondary | ICD-10-CM | POA: Diagnosis not present

## 2013-01-05 DIAGNOSIS — F329 Major depressive disorder, single episode, unspecified: Secondary | ICD-10-CM | POA: Diagnosis not present

## 2013-01-05 DIAGNOSIS — I4891 Unspecified atrial fibrillation: Secondary | ICD-10-CM | POA: Diagnosis not present

## 2013-01-06 DIAGNOSIS — Z5181 Encounter for therapeutic drug level monitoring: Secondary | ICD-10-CM | POA: Diagnosis not present

## 2013-01-06 DIAGNOSIS — I10 Essential (primary) hypertension: Secondary | ICD-10-CM | POA: Diagnosis not present

## 2013-01-06 DIAGNOSIS — Z48816 Encounter for surgical aftercare following surgery on the genitourinary system: Secondary | ICD-10-CM | POA: Diagnosis not present

## 2013-01-06 DIAGNOSIS — I4891 Unspecified atrial fibrillation: Secondary | ICD-10-CM | POA: Diagnosis not present

## 2013-01-06 DIAGNOSIS — F329 Major depressive disorder, single episode, unspecified: Secondary | ICD-10-CM | POA: Diagnosis not present

## 2013-01-07 DIAGNOSIS — I4891 Unspecified atrial fibrillation: Secondary | ICD-10-CM | POA: Diagnosis not present

## 2013-01-07 DIAGNOSIS — F329 Major depressive disorder, single episode, unspecified: Secondary | ICD-10-CM | POA: Diagnosis not present

## 2013-01-07 DIAGNOSIS — Z48816 Encounter for surgical aftercare following surgery on the genitourinary system: Secondary | ICD-10-CM | POA: Diagnosis not present

## 2013-01-07 DIAGNOSIS — I10 Essential (primary) hypertension: Secondary | ICD-10-CM | POA: Diagnosis not present

## 2013-01-07 DIAGNOSIS — Z5181 Encounter for therapeutic drug level monitoring: Secondary | ICD-10-CM | POA: Diagnosis not present

## 2013-01-08 DIAGNOSIS — F329 Major depressive disorder, single episode, unspecified: Secondary | ICD-10-CM | POA: Diagnosis not present

## 2013-01-08 DIAGNOSIS — I10 Essential (primary) hypertension: Secondary | ICD-10-CM | POA: Diagnosis not present

## 2013-01-08 DIAGNOSIS — Z48816 Encounter for surgical aftercare following surgery on the genitourinary system: Secondary | ICD-10-CM | POA: Diagnosis not present

## 2013-01-08 DIAGNOSIS — Z5181 Encounter for therapeutic drug level monitoring: Secondary | ICD-10-CM | POA: Diagnosis not present

## 2013-01-08 DIAGNOSIS — I4891 Unspecified atrial fibrillation: Secondary | ICD-10-CM | POA: Diagnosis not present

## 2013-01-12 DIAGNOSIS — F329 Major depressive disorder, single episode, unspecified: Secondary | ICD-10-CM | POA: Diagnosis not present

## 2013-01-12 DIAGNOSIS — I10 Essential (primary) hypertension: Secondary | ICD-10-CM | POA: Diagnosis not present

## 2013-01-12 DIAGNOSIS — Z5181 Encounter for therapeutic drug level monitoring: Secondary | ICD-10-CM | POA: Diagnosis not present

## 2013-01-12 DIAGNOSIS — Z48816 Encounter for surgical aftercare following surgery on the genitourinary system: Secondary | ICD-10-CM | POA: Diagnosis not present

## 2013-01-12 DIAGNOSIS — I4891 Unspecified atrial fibrillation: Secondary | ICD-10-CM | POA: Diagnosis not present

## 2013-01-14 DIAGNOSIS — Z5181 Encounter for therapeutic drug level monitoring: Secondary | ICD-10-CM | POA: Diagnosis not present

## 2013-01-14 DIAGNOSIS — I10 Essential (primary) hypertension: Secondary | ICD-10-CM | POA: Diagnosis not present

## 2013-01-14 DIAGNOSIS — I4891 Unspecified atrial fibrillation: Secondary | ICD-10-CM | POA: Diagnosis not present

## 2013-01-14 DIAGNOSIS — Z48816 Encounter for surgical aftercare following surgery on the genitourinary system: Secondary | ICD-10-CM | POA: Diagnosis not present

## 2013-01-14 DIAGNOSIS — F329 Major depressive disorder, single episode, unspecified: Secondary | ICD-10-CM | POA: Diagnosis not present

## 2013-01-16 DIAGNOSIS — I4891 Unspecified atrial fibrillation: Secondary | ICD-10-CM | POA: Diagnosis not present

## 2013-01-16 DIAGNOSIS — F329 Major depressive disorder, single episode, unspecified: Secondary | ICD-10-CM | POA: Diagnosis not present

## 2013-01-16 DIAGNOSIS — Z48816 Encounter for surgical aftercare following surgery on the genitourinary system: Secondary | ICD-10-CM | POA: Diagnosis not present

## 2013-01-16 DIAGNOSIS — Z5181 Encounter for therapeutic drug level monitoring: Secondary | ICD-10-CM | POA: Diagnosis not present

## 2013-01-16 DIAGNOSIS — I10 Essential (primary) hypertension: Secondary | ICD-10-CM | POA: Diagnosis not present

## 2013-01-19 DIAGNOSIS — F329 Major depressive disorder, single episode, unspecified: Secondary | ICD-10-CM | POA: Diagnosis not present

## 2013-01-19 DIAGNOSIS — I10 Essential (primary) hypertension: Secondary | ICD-10-CM | POA: Diagnosis not present

## 2013-01-19 DIAGNOSIS — Z5181 Encounter for therapeutic drug level monitoring: Secondary | ICD-10-CM | POA: Diagnosis not present

## 2013-01-19 DIAGNOSIS — Z48816 Encounter for surgical aftercare following surgery on the genitourinary system: Secondary | ICD-10-CM | POA: Diagnosis not present

## 2013-01-19 DIAGNOSIS — I4891 Unspecified atrial fibrillation: Secondary | ICD-10-CM | POA: Diagnosis not present

## 2013-01-21 DIAGNOSIS — M84459D Pathological fracture, hip, unspecified, subsequent encounter for fracture with routine healing: Secondary | ICD-10-CM | POA: Diagnosis not present

## 2013-01-22 DIAGNOSIS — Z5181 Encounter for therapeutic drug level monitoring: Secondary | ICD-10-CM | POA: Diagnosis not present

## 2013-01-22 DIAGNOSIS — Z48816 Encounter for surgical aftercare following surgery on the genitourinary system: Secondary | ICD-10-CM | POA: Diagnosis not present

## 2013-01-22 DIAGNOSIS — I4891 Unspecified atrial fibrillation: Secondary | ICD-10-CM | POA: Diagnosis not present

## 2013-01-22 DIAGNOSIS — F329 Major depressive disorder, single episode, unspecified: Secondary | ICD-10-CM | POA: Diagnosis not present

## 2013-01-22 DIAGNOSIS — I10 Essential (primary) hypertension: Secondary | ICD-10-CM | POA: Diagnosis not present

## 2013-01-27 DIAGNOSIS — I4891 Unspecified atrial fibrillation: Secondary | ICD-10-CM | POA: Diagnosis not present

## 2013-01-27 DIAGNOSIS — I10 Essential (primary) hypertension: Secondary | ICD-10-CM | POA: Diagnosis not present

## 2013-01-27 DIAGNOSIS — Z48816 Encounter for surgical aftercare following surgery on the genitourinary system: Secondary | ICD-10-CM | POA: Diagnosis not present

## 2013-01-27 DIAGNOSIS — Z5181 Encounter for therapeutic drug level monitoring: Secondary | ICD-10-CM | POA: Diagnosis not present

## 2013-01-27 DIAGNOSIS — F329 Major depressive disorder, single episode, unspecified: Secondary | ICD-10-CM | POA: Diagnosis not present

## 2013-01-29 DIAGNOSIS — I10 Essential (primary) hypertension: Secondary | ICD-10-CM | POA: Diagnosis not present

## 2013-01-29 DIAGNOSIS — F329 Major depressive disorder, single episode, unspecified: Secondary | ICD-10-CM | POA: Diagnosis not present

## 2013-01-29 DIAGNOSIS — I4891 Unspecified atrial fibrillation: Secondary | ICD-10-CM | POA: Diagnosis not present

## 2013-01-29 DIAGNOSIS — Z5181 Encounter for therapeutic drug level monitoring: Secondary | ICD-10-CM | POA: Diagnosis not present

## 2013-01-29 DIAGNOSIS — Z48816 Encounter for surgical aftercare following surgery on the genitourinary system: Secondary | ICD-10-CM | POA: Diagnosis not present

## 2013-01-30 DIAGNOSIS — I4891 Unspecified atrial fibrillation: Secondary | ICD-10-CM | POA: Diagnosis not present

## 2013-01-30 DIAGNOSIS — F329 Major depressive disorder, single episode, unspecified: Secondary | ICD-10-CM | POA: Diagnosis not present

## 2013-01-30 DIAGNOSIS — Z5181 Encounter for therapeutic drug level monitoring: Secondary | ICD-10-CM | POA: Diagnosis not present

## 2013-01-30 DIAGNOSIS — Z48816 Encounter for surgical aftercare following surgery on the genitourinary system: Secondary | ICD-10-CM | POA: Diagnosis not present

## 2013-01-30 DIAGNOSIS — I10 Essential (primary) hypertension: Secondary | ICD-10-CM | POA: Diagnosis not present

## 2013-02-03 DIAGNOSIS — I10 Essential (primary) hypertension: Secondary | ICD-10-CM | POA: Diagnosis not present

## 2013-02-03 DIAGNOSIS — Z5181 Encounter for therapeutic drug level monitoring: Secondary | ICD-10-CM | POA: Diagnosis not present

## 2013-02-03 DIAGNOSIS — F329 Major depressive disorder, single episode, unspecified: Secondary | ICD-10-CM | POA: Diagnosis not present

## 2013-02-03 DIAGNOSIS — Z48816 Encounter for surgical aftercare following surgery on the genitourinary system: Secondary | ICD-10-CM | POA: Diagnosis not present

## 2013-02-03 DIAGNOSIS — I4891 Unspecified atrial fibrillation: Secondary | ICD-10-CM | POA: Diagnosis not present

## 2013-02-04 DIAGNOSIS — I10 Essential (primary) hypertension: Secondary | ICD-10-CM | POA: Diagnosis not present

## 2013-02-04 DIAGNOSIS — F329 Major depressive disorder, single episode, unspecified: Secondary | ICD-10-CM | POA: Diagnosis not present

## 2013-02-04 DIAGNOSIS — Z48816 Encounter for surgical aftercare following surgery on the genitourinary system: Secondary | ICD-10-CM | POA: Diagnosis not present

## 2013-02-04 DIAGNOSIS — I4891 Unspecified atrial fibrillation: Secondary | ICD-10-CM | POA: Diagnosis not present

## 2013-02-04 DIAGNOSIS — Z5181 Encounter for therapeutic drug level monitoring: Secondary | ICD-10-CM | POA: Diagnosis not present

## 2013-02-05 DIAGNOSIS — Z5181 Encounter for therapeutic drug level monitoring: Secondary | ICD-10-CM | POA: Diagnosis not present

## 2013-02-05 DIAGNOSIS — Z48816 Encounter for surgical aftercare following surgery on the genitourinary system: Secondary | ICD-10-CM | POA: Diagnosis not present

## 2013-02-05 DIAGNOSIS — I10 Essential (primary) hypertension: Secondary | ICD-10-CM | POA: Diagnosis not present

## 2013-02-05 DIAGNOSIS — I4891 Unspecified atrial fibrillation: Secondary | ICD-10-CM | POA: Diagnosis not present

## 2013-02-05 DIAGNOSIS — F329 Major depressive disorder, single episode, unspecified: Secondary | ICD-10-CM | POA: Diagnosis not present

## 2013-02-11 DIAGNOSIS — I4891 Unspecified atrial fibrillation: Secondary | ICD-10-CM | POA: Diagnosis not present

## 2013-02-11 DIAGNOSIS — Z48816 Encounter for surgical aftercare following surgery on the genitourinary system: Secondary | ICD-10-CM | POA: Diagnosis not present

## 2013-02-11 DIAGNOSIS — F329 Major depressive disorder, single episode, unspecified: Secondary | ICD-10-CM | POA: Diagnosis not present

## 2013-02-11 DIAGNOSIS — Z5181 Encounter for therapeutic drug level monitoring: Secondary | ICD-10-CM | POA: Diagnosis not present

## 2013-02-11 DIAGNOSIS — I10 Essential (primary) hypertension: Secondary | ICD-10-CM | POA: Diagnosis not present

## 2013-02-12 DIAGNOSIS — M84459D Pathological fracture, hip, unspecified, subsequent encounter for fracture with routine healing: Secondary | ICD-10-CM | POA: Diagnosis not present

## 2013-02-13 DIAGNOSIS — Z5181 Encounter for therapeutic drug level monitoring: Secondary | ICD-10-CM | POA: Diagnosis not present

## 2013-02-13 DIAGNOSIS — I4891 Unspecified atrial fibrillation: Secondary | ICD-10-CM | POA: Diagnosis not present

## 2013-02-13 DIAGNOSIS — Z48816 Encounter for surgical aftercare following surgery on the genitourinary system: Secondary | ICD-10-CM | POA: Diagnosis not present

## 2013-02-13 DIAGNOSIS — I10 Essential (primary) hypertension: Secondary | ICD-10-CM | POA: Diagnosis not present

## 2013-02-13 DIAGNOSIS — F329 Major depressive disorder, single episode, unspecified: Secondary | ICD-10-CM | POA: Diagnosis not present

## 2013-02-18 DIAGNOSIS — Z7901 Long term (current) use of anticoagulants: Secondary | ICD-10-CM | POA: Diagnosis not present

## 2013-02-18 DIAGNOSIS — I6789 Other cerebrovascular disease: Secondary | ICD-10-CM | POA: Diagnosis not present

## 2013-03-03 DIAGNOSIS — I6789 Other cerebrovascular disease: Secondary | ICD-10-CM | POA: Diagnosis not present

## 2013-03-03 DIAGNOSIS — Z7901 Long term (current) use of anticoagulants: Secondary | ICD-10-CM | POA: Diagnosis not present

## 2013-03-06 DIAGNOSIS — Z7901 Long term (current) use of anticoagulants: Secondary | ICD-10-CM | POA: Diagnosis not present

## 2013-03-06 DIAGNOSIS — I6789 Other cerebrovascular disease: Secondary | ICD-10-CM | POA: Diagnosis not present

## 2013-03-12 DIAGNOSIS — I6789 Other cerebrovascular disease: Secondary | ICD-10-CM | POA: Diagnosis not present

## 2013-03-12 DIAGNOSIS — Z7901 Long term (current) use of anticoagulants: Secondary | ICD-10-CM | POA: Diagnosis not present

## 2013-03-18 DIAGNOSIS — S72009A Fracture of unspecified part of neck of unspecified femur, initial encounter for closed fracture: Secondary | ICD-10-CM | POA: Diagnosis not present

## 2013-03-18 DIAGNOSIS — I6789 Other cerebrovascular disease: Secondary | ICD-10-CM | POA: Diagnosis not present

## 2013-03-18 DIAGNOSIS — Z7901 Long term (current) use of anticoagulants: Secondary | ICD-10-CM | POA: Diagnosis not present

## 2013-03-18 DIAGNOSIS — I4891 Unspecified atrial fibrillation: Secondary | ICD-10-CM | POA: Diagnosis not present

## 2013-04-01 DIAGNOSIS — Z7901 Long term (current) use of anticoagulants: Secondary | ICD-10-CM | POA: Diagnosis not present

## 2013-04-01 DIAGNOSIS — I6789 Other cerebrovascular disease: Secondary | ICD-10-CM | POA: Diagnosis not present

## 2013-04-16 DIAGNOSIS — S63509A Unspecified sprain of unspecified wrist, initial encounter: Secondary | ICD-10-CM | POA: Diagnosis not present

## 2013-04-24 ENCOUNTER — Ambulatory Visit (INDEPENDENT_AMBULATORY_CARE_PROVIDER_SITE_OTHER): Payer: Medicare Other | Admitting: Pharmacist

## 2013-04-24 DIAGNOSIS — I6789 Other cerebrovascular disease: Secondary | ICD-10-CM | POA: Diagnosis not present

## 2013-04-24 LAB — POCT INR: INR: 2

## 2013-04-24 MED ORDER — WARFARIN SODIUM 2 MG PO TABS: ORAL_TABLET | ORAL | Status: DC

## 2013-05-05 ENCOUNTER — Telehealth: Payer: Self-pay | Admitting: *Deleted

## 2013-05-05 NOTE — Telephone Encounter (Signed)
Pt called to this RN to state she has found a " new nodule " under her right arm " and would like to make an appointment with Dr Darnelle Catalan ". Area noted yesterday and is described has " hard and the size of a black eye pea ".  Note pt's remote history of breast cancer was on the left side.  Per further discussion pt states she had recent hip infection and right arm was used for IV access including a PICC line for home IV use.  This note will be given to MD for review for appropriate recommendation.  Last mammogram was Jan 2014.

## 2013-05-06 ENCOUNTER — Other Ambulatory Visit: Payer: Self-pay | Admitting: Oncology

## 2013-05-06 ENCOUNTER — Telehealth: Payer: Self-pay | Admitting: Oncology

## 2013-05-06 DIAGNOSIS — R2231 Localized swelling, mass and lump, right upper limb: Secondary | ICD-10-CM

## 2013-05-06 DIAGNOSIS — C50912 Malignant neoplasm of unspecified site of left female breast: Secondary | ICD-10-CM

## 2013-05-06 NOTE — Progress Notes (Signed)
Renee Adams called yesterday to report that she noted a mass in her right axilla 2 days ago. We asked her to drop in for exam.  She has a 3 mm subcutaneous movable nontender nodule up in the left axilla, which is most likely a blocked gland. However she is just about due for repeat annual mammography in any case. I'm sending her to the breast Center for right mammogram and right ultrasonography as needed. If there is anything suspicious we can consider surgical evaluation. However I reassured her my expectation is this will be benign.

## 2013-05-22 ENCOUNTER — Ambulatory Visit
Admission: RE | Admit: 2013-05-22 | Discharge: 2013-05-22 | Disposition: A | Discharge status: Home / Self Care | Payer: Medicare Other | Source: Ambulatory Visit | Attending: Oncology | Admitting: Oncology

## 2013-05-22 DIAGNOSIS — R2231 Localized swelling, mass and lump, right upper limb: Secondary | ICD-10-CM

## 2013-05-22 DIAGNOSIS — N6489 Other specified disorders of breast: Secondary | ICD-10-CM | POA: Diagnosis not present

## 2013-05-29 ENCOUNTER — Ambulatory Visit (INDEPENDENT_AMBULATORY_CARE_PROVIDER_SITE_OTHER): Payer: Medicare Other | Admitting: Pharmacist

## 2013-05-29 DIAGNOSIS — I6789 Other cerebrovascular disease: Secondary | ICD-10-CM

## 2013-05-29 LAB — POCT INR: INR: 2.2

## 2013-06-08 ENCOUNTER — Other Ambulatory Visit: Payer: Self-pay | Admitting: Physical Medicine & Rehabilitation

## 2013-06-18 DIAGNOSIS — H409 Unspecified glaucoma: Secondary | ICD-10-CM | POA: Diagnosis not present

## 2013-06-18 DIAGNOSIS — H4011X0 Primary open-angle glaucoma, stage unspecified: Secondary | ICD-10-CM | POA: Diagnosis not present

## 2013-06-26 ENCOUNTER — Ambulatory Visit (INDEPENDENT_AMBULATORY_CARE_PROVIDER_SITE_OTHER): Payer: Medicare Other | Admitting: Pharmacist

## 2013-06-26 DIAGNOSIS — Z79899 Other long term (current) drug therapy: Secondary | ICD-10-CM | POA: Diagnosis not present

## 2013-06-26 DIAGNOSIS — R350 Frequency of micturition: Secondary | ICD-10-CM | POA: Diagnosis not present

## 2013-06-26 DIAGNOSIS — I6789 Other cerebrovascular disease: Secondary | ICD-10-CM

## 2013-06-26 DIAGNOSIS — F329 Major depressive disorder, single episode, unspecified: Secondary | ICD-10-CM | POA: Diagnosis not present

## 2013-06-26 DIAGNOSIS — G2589 Other specified extrapyramidal and movement disorders: Secondary | ICD-10-CM | POA: Diagnosis not present

## 2013-06-26 DIAGNOSIS — I10 Essential (primary) hypertension: Secondary | ICD-10-CM | POA: Diagnosis not present

## 2013-06-26 DIAGNOSIS — E559 Vitamin D deficiency, unspecified: Secondary | ICD-10-CM | POA: Diagnosis not present

## 2013-06-26 DIAGNOSIS — Z23 Encounter for immunization: Secondary | ICD-10-CM | POA: Diagnosis not present

## 2013-06-26 LAB — POCT INR: INR: 2.1

## 2013-08-06 ENCOUNTER — Ambulatory Visit (INDEPENDENT_AMBULATORY_CARE_PROVIDER_SITE_OTHER): Payer: Medicare Other | Admitting: *Deleted

## 2013-08-06 DIAGNOSIS — Z5181 Encounter for therapeutic drug level monitoring: Secondary | ICD-10-CM | POA: Insufficient documentation

## 2013-08-06 DIAGNOSIS — I6789 Other cerebrovascular disease: Secondary | ICD-10-CM | POA: Diagnosis not present

## 2013-08-06 LAB — POCT INR: INR: 5.4

## 2013-08-13 ENCOUNTER — Ambulatory Visit (INDEPENDENT_AMBULATORY_CARE_PROVIDER_SITE_OTHER): Payer: Medicare Other | Admitting: *Deleted

## 2013-08-13 DIAGNOSIS — Z5181 Encounter for therapeutic drug level monitoring: Secondary | ICD-10-CM

## 2013-08-13 DIAGNOSIS — I6789 Other cerebrovascular disease: Secondary | ICD-10-CM | POA: Diagnosis not present

## 2013-08-13 LAB — POCT INR: INR: 1.4

## 2013-08-31 ENCOUNTER — Ambulatory Visit (INDEPENDENT_AMBULATORY_CARE_PROVIDER_SITE_OTHER): Payer: Medicare Other | Admitting: Pharmacist

## 2013-08-31 DIAGNOSIS — I6789 Other cerebrovascular disease: Secondary | ICD-10-CM | POA: Diagnosis not present

## 2013-08-31 DIAGNOSIS — Z5181 Encounter for therapeutic drug level monitoring: Secondary | ICD-10-CM

## 2013-08-31 LAB — POCT INR: INR: 2

## 2013-09-14 ENCOUNTER — Other Ambulatory Visit (HOSPITAL_BASED_OUTPATIENT_CLINIC_OR_DEPARTMENT_OTHER): Payer: Medicare Other

## 2013-09-14 ENCOUNTER — Encounter: Payer: Self-pay | Admitting: Physician Assistant

## 2013-09-14 ENCOUNTER — Ambulatory Visit (HOSPITAL_BASED_OUTPATIENT_CLINIC_OR_DEPARTMENT_OTHER): Payer: Medicare Other | Admitting: Physician Assistant

## 2013-09-14 VITALS — BP 123/74 | HR 90 | Temp 98.0°F | Resp 20 | Ht 64.0 in | Wt 143.5 lb

## 2013-09-14 DIAGNOSIS — Z1231 Encounter for screening mammogram for malignant neoplasm of breast: Secondary | ICD-10-CM

## 2013-09-14 DIAGNOSIS — Z170 Estrogen receptor positive status [ER+]: Secondary | ICD-10-CM

## 2013-09-14 DIAGNOSIS — C50912 Malignant neoplasm of unspecified site of left female breast: Secondary | ICD-10-CM

## 2013-09-14 DIAGNOSIS — Z8673 Personal history of transient ischemic attack (TIA), and cerebral infarction without residual deficits: Secondary | ICD-10-CM | POA: Diagnosis not present

## 2013-09-14 DIAGNOSIS — Z853 Personal history of malignant neoplasm of breast: Secondary | ICD-10-CM

## 2013-09-14 LAB — COMPREHENSIVE METABOLIC PANEL (CC13)
ALBUMIN: 4 g/dL (ref 3.5–5.0)
ALT: 14 U/L (ref 0–55)
ANION GAP: 10 meq/L (ref 3–11)
AST: 18 U/L (ref 5–34)
Alkaline Phosphatase: 94 U/L (ref 40–150)
BUN: 23.9 mg/dL (ref 7.0–26.0)
CHLORIDE: 109 meq/L (ref 98–109)
CO2: 25 meq/L (ref 22–29)
Calcium: 10 mg/dL (ref 8.4–10.4)
Creatinine: 1 mg/dL (ref 0.6–1.1)
GLUCOSE: 68 mg/dL — AB (ref 70–140)
POTASSIUM: 4.6 meq/L (ref 3.5–5.1)
SODIUM: 143 meq/L (ref 136–145)
TOTAL PROTEIN: 7.2 g/dL (ref 6.4–8.3)
Total Bilirubin: 0.48 mg/dL (ref 0.20–1.20)

## 2013-09-14 LAB — CBC WITH DIFFERENTIAL/PLATELET
BASO%: 3.9 % — ABNORMAL HIGH (ref 0.0–2.0)
Basophils Absolute: 0.2 10*3/uL — ABNORMAL HIGH (ref 0.0–0.1)
EOS ABS: 0.3 10*3/uL (ref 0.0–0.5)
EOS%: 4.4 % (ref 0.0–7.0)
HCT: 44.6 % (ref 34.8–46.6)
HGB: 14.7 g/dL (ref 11.6–15.9)
LYMPH#: 1.8 10*3/uL (ref 0.9–3.3)
LYMPH%: 28.4 % (ref 14.0–49.7)
MCH: 28.7 pg (ref 25.1–34.0)
MCHC: 33 g/dL (ref 31.5–36.0)
MCV: 87 fL (ref 79.5–101.0)
MONO#: 0.4 10*3/uL (ref 0.1–0.9)
MONO%: 6.8 % (ref 0.0–14.0)
NEUT%: 56.5 % (ref 38.4–76.8)
NEUTROS ABS: 3.6 10*3/uL (ref 1.5–6.5)
PLATELETS: 204 10*3/uL (ref 145–400)
RBC: 5.12 10*6/uL (ref 3.70–5.45)
RDW: 14.6 % — AB (ref 11.2–14.5)
WBC: 6.3 10*3/uL (ref 3.9–10.3)

## 2013-09-14 NOTE — Progress Notes (Signed)
Hematology and Oncology Follow Up Visit  ATIANNA HAIDAR [id number]   [id number]  [birth year] 80 y.o. 09/14/2013    PCP:  Donnie Coffin, MD  CHIEF COMPLAINT: Remote Hx of Left Breast Cancer    BREAST CANCER HISTORY: Jenny was originally diagnosed with a left breast carcinoma in early 1996. Tumor was T1, N0, ER and PR positive. She underwent left lumpectomy and sentinel lymph node dissection in March of 1996.  Patient was treated with tamoxifen for 5 years, then Evista for 3 years. The Evsita was then discontinued secondary to history of stroke.   Currently being followed with observation alone.  INTERIM HISTORY: Laquanna returns alone today for routine followup of her remote left breast carcinoma. She actually has a friend with her today - the mother of one of our past patients - is waiting in the lobby.    Gordie herself is doing well. In fact, with regards to her breast cancer there are no new changes. Interval history is notable for Ruthetta having noted a small nodule in the right axilla which was evaluated by diagnostic mammogram and right ultrasound in November 2014. This is generally unremarkable and was thought to be a sebaceous cyst. It has since completely resolved.  Otherwise, interval history is notable for Altagracia having fallen in may 2014, fracturing her right femur. This led to an extensive hospitalization, surgery, and then rehabilitation. She is walking well with the assistance of a cane, and was able to step up to the exam table with very little assistance today.   REVIEW OF SYSTEMS: Claretha has had no recent illnesses and denies any fevers or chills. She's had no rashes or skin changes. Her energy level is fair, and she does have some occasional insomnia and fatigue. She's eating and drinking well with no nausea and no recent change in bowel habits. She has some urinary stress incontinence which is stable. She's had no dysuria or hematuria. She denies any increased cough,  shortness of breath, chest pain, or palpitations. She has chronic joint pain associated with arthritis. She also has some postsurgical pain in the left breast and left axilla which is stable. She has a long-standing history of depression which she tells me is also stable, and she denies any problems with anxiety or suicidal ideation. She's had no abnormal headaches, dizziness, or change in vision.  A detailed review of systems is otherwise stable and noncontributory.    Medications:   Current Outpatient Prescriptions  Medication Sig Dispense Refill  . ALPRAZolam (XANAX) 0.25 MG tablet Take 4 tablets (1 mg total) by mouth every 4 (four) hours as needed for anxiety.  40 tablet  0  . amLODipine (NORVASC) 5 MG tablet Take 1 tablet (5 mg total) by mouth daily.  30 tablet  1  . atorvastatin (LIPITOR) 10 MG tablet Take 1 tablet (10 mg total) by mouth daily.  30 tablet  1  . buPROPion (WELLBUTRIN XL) 300 MG 24 hr tablet Take 1 tablet (300 mg total) by mouth daily.  30 tablet  1  . ergocalciferol (VITAMIN D2) 50000 UNITS capsule Take 50,000 Units by mouth once a week.      . latanoprost (XALATAN) 0.005 % ophthalmic solution Place 1 drop into both eyes at bedtime.  2.5 mL  12  . sertraline (ZOLOFT) 100 MG tablet Take 1.5 tablets (150 mg total) by mouth daily.  30 tablet  1  . warfarin (COUMADIN) 2 MG tablet Take 1 tablet on all days except 1 and 1/2 tablets  on Monday or as directed by [facility]in Clinic  36 tablet  5  . calcium carbonate 200 MG capsule Take 3 capsules (600 mg total) by mouth 2 (two) times daily with a meal.  60 capsule  1  . HYDROcodone-acetaminophen (NORCO/VICODIN) 5-325 MG per tablet Take 1-2 tablets by mouth every 6 (six) hours as needed for pain.  90 tablet  0  . methocarbamol (ROBAXIN) 500 MG tablet Take 1 tablet (500 mg total) by mouth every 6 (six) hours as needed.  60 tablet  0  . rOPINIRole (REQUIP) 1 MG tablet Take 1 tablet (1 mg total) by mouth at bedtime as needed. For restless  legs  30 tablet  0  . [DISCONTINUED] pramipexole (MIRAPEX) 0.125 MG tablet Take 0.125 mg by mouth 3 (three) times daily.       No current facility-administered medications for this visit.    Allergies:  Allergies  Allergen Reactions  . Dilaudid [Hydromorphone Hcl] Shortness Of Breath    Tolerates tramadol  . Morphine And Related Shortness Of Breath    Tolerates tramadol  . Sulfa Antibiotics Other (See Comments)    "Made me drunk";  wobbly  . Keflex [Cephalexin] Nausea Only  . Levaquin [Levofloxacin] Hives and Rash    Physical Exam: Elderly white woman in no acute distress, ambulating with the assistance of a cane Filed Vitals:   09/14/13 1350  BP: 123/74  Pulse: 90  Temp: 98 F (36.7 C)  Resp: 20    Body mass index is 24.62 kg/(m^2).  ECOG:  1 Filed Weights   09/14/13 1350  Weight: 143 lb 8 oz (65.091 kg)   Physical Exam: HEENT:  Sclerae anicteric.  Oropharynx clear and moist. Neck supple, trachea midline.  NODES:  No cervical or supraclavicular lymphadenopathy palpated.  BREAST EXAM: Left breast is status post lumpectomy with no suspicious nodules or skin changes, and no evidence of local recurrence. Right breast is unremarkable. Axillae are benign bilaterally, with no palpable lymphadenopathy. Specifically, there were no nodularities noted in the right axilla today. LUNGS:  Clear to auscultation bilaterally.  No wheezes or rhonchi HEART:  Regular rate and rhythm.   ABDOMEN:  Soft, nontender.  Positive bowel sounds.  MSK:  No focal spinal tenderness to palpation. Notable kyphosis on exam. Good range of motion bilaterally in the upper extremities. EXTREMITIES:  No peripheral edema.   SKIN:  Benign no visible rashes or skin changes. No excessive ecchymoses. No petechiae. No pallor. NEURO:  Nonfocal. Well oriented.  Appropriate affect.     Lab Results: Lab Results  Component Value Date   WBC 6.3 09/14/2013   HGB 14.7 09/14/2013   HCT 44.6 09/14/2013   MCV 87.0 09/14/2013    PLT 204 09/14/2013   NEUTROABS 3.6 09/14/2013     Chemistry      Component Value Date/Time   NA 143 09/14/2013 1300   NA 142 12/05/2012 1557   K 4.6 09/14/2013 1300   K 3.7 12/05/2012 1557   CL 107 12/05/2012 1557   CL 109* 09/10/2012 1315   CO2 25 09/14/2013 1300   CO2 26 12/05/2012 1557   BUN 23.9 09/14/2013 1300   BUN 16 12/05/2012 1557   CREATININE 1.0 09/14/2013 1300   CREATININE 0.80 12/05/2012 1557      Component Value Date/Time   CALCIUM 10.0 09/14/2013 1300   CALCIUM 9.2 12/05/2012 1557   ALKPHOS 94 09/14/2013 1300   ALKPHOS 73 12/03/2012 0548   AST 18 09/14/2013 1300   AST  37 12/03/2012 0548   ALT 14 09/14/2013 1300   ALT 16 12/03/2012 0548   BILITOT 0.48 09/14/2013 1300   BILITOT 0.4 12/03/2012 0548      STUDIES:  Most recent bilateral screening mammogram on 07/11/2012 was unremarkable.   MM Digital Diagnostic Unilat R & US Breast Right 05/22/2014  CLINICAL DATA: Palpable abnormality in the right axilla  EXAM:  DIGITAL DIAGNOSTIC RIGHT MAMMOGRAM  DIGITAL BREAST TOMOSYNTHESIS  Digital breast tomosynthesis images are acquired in two projections.  These images are reviewed in combination with the digital mammogram,  confirming the findings below.  RIGHT BREAST ULTRASOUND  COMPARISON: Previous exams.  ACR Breast Density Category b: There are scattered areas of  fibroglandular density.  FINDINGS:  No suspicious masses or calcifications are seen in the right breast.  Spot compression tangential view was performed over the palpable  site of concern in the right axilla, with no mammographic  abnormalities seen in this location. Mammographic images were  processed with CAD.  Physical examination at site of palpable concern in the right axilla  reveals a small firm nodule just beneath the skin surface.  Targeted ultrasound at site of palpable concern in the right axilla  was performed demonstrating subtle area of focal mild skin  thickening measuring 0.3 x 0.9 x 0.2 cm, possibly related to a  small  sebaceous cyst.  IMPRESSION:  1. Subtle area of focal mild skin thickening at site of palpable  concern in the right axilla, possibly related to a small sebaceous  cyst.  2. There is no mammographic evidence of malignancy in either breast.  RECOMMENDATION:  1. Recommend further management of the palpable abnormality in the  right axilla be based on clinical grounds.  2. Screening mammogram in one year.(Code:SM-B-01Y)  I have discussed the findings and recommendations with the patient.  Results were also provided in writing at the conclusion of the  visit. If applicable, a reminder letter will be sent to the patient  regarding the next appointment.  BI-RADS CATEGORY 2: Benign Finding(s)  Electronically Signed  By: Everlean Alstrom M.D.  On: 05/22/2013 15:03     Assessment:  80 y.o. [place] woman   (1)  status post left lumpectomy and sentinel lymph node dissection March 1996 for a T1, N0, ER/PR positive breast carcinoma.    (2)  Treated with tamoxifen for five years, then Evista for three years with the Evista discontinued secondary to stroke.    (3)  Currently being followed with observation alone.    Plan:  Annella is doing very well with regards to her breast cancer, and there is no clinical evidence of disease recurrence at this time. She is slightly past due for her bilateral mammogram, and we will get that scheduled for her as soon as possible.  Otherwise, I once again offered to allow her to "graduate" from followup, but she tells me she feels much more comfortable if she is seen at least once annually. Accordingly, she will see [place] again next March, but will call prior that time with any changes or problems.   Pansie Guggisberg  PA-C 09/14/2013

## 2013-09-17 ENCOUNTER — Encounter: Payer: Self-pay | Admitting: Interventional Cardiology

## 2013-09-21 ENCOUNTER — Ambulatory Visit (INDEPENDENT_AMBULATORY_CARE_PROVIDER_SITE_OTHER): Payer: Medicare Other | Admitting: Pharmacist

## 2013-09-21 DIAGNOSIS — I6789 Other cerebrovascular disease: Secondary | ICD-10-CM

## 2013-09-21 DIAGNOSIS — Z5181 Encounter for therapeutic drug level monitoring: Secondary | ICD-10-CM

## 2013-09-21 LAB — POCT INR: INR: 1.6

## 2013-10-02 ENCOUNTER — Ambulatory Visit
Admission: RE | Admit: 2013-10-02 | Discharge: 2013-10-02 | Disposition: A | Discharge status: Home / Self Care | Payer: Medicare Other | Source: Ambulatory Visit | Attending: Physician Assistant | Admitting: Physician Assistant

## 2013-10-02 DIAGNOSIS — Z1231 Encounter for screening mammogram for malignant neoplasm of breast: Secondary | ICD-10-CM

## 2013-10-05 ENCOUNTER — Ambulatory Visit (INDEPENDENT_AMBULATORY_CARE_PROVIDER_SITE_OTHER): Payer: Medicare Other | Admitting: *Deleted

## 2013-10-05 DIAGNOSIS — I6789 Other cerebrovascular disease: Secondary | ICD-10-CM | POA: Diagnosis not present

## 2013-10-05 DIAGNOSIS — Z5181 Encounter for therapeutic drug level monitoring: Secondary | ICD-10-CM | POA: Diagnosis not present

## 2013-10-05 LAB — POCT INR: INR: 2.5

## 2013-10-07 ENCOUNTER — Other Ambulatory Visit: Payer: Self-pay | Admitting: Physician Assistant

## 2013-10-07 DIAGNOSIS — R928 Other abnormal and inconclusive findings on diagnostic imaging of breast: Secondary | ICD-10-CM

## 2013-10-16 ENCOUNTER — Ambulatory Visit
Admission: RE | Admit: 2013-10-16 | Discharge: 2013-10-16 | Disposition: A | Discharge status: Home / Self Care | Payer: Medicare Other | Source: Ambulatory Visit | Attending: Physician Assistant | Admitting: Physician Assistant

## 2013-10-16 DIAGNOSIS — R928 Other abnormal and inconclusive findings on diagnostic imaging of breast: Secondary | ICD-10-CM

## 2013-10-20 ENCOUNTER — Ambulatory Visit: Payer: Medicare Other | Admitting: Interventional Cardiology

## 2013-10-31 ENCOUNTER — Encounter: Payer: Self-pay | Admitting: Interventional Cardiology

## 2013-11-03 ENCOUNTER — Ambulatory Visit (INDEPENDENT_AMBULATORY_CARE_PROVIDER_SITE_OTHER): Payer: Medicare Other

## 2013-11-03 DIAGNOSIS — Z5181 Encounter for therapeutic drug level monitoring: Secondary | ICD-10-CM | POA: Diagnosis not present

## 2013-11-03 DIAGNOSIS — I6789 Other cerebrovascular disease: Secondary | ICD-10-CM

## 2013-11-03 LAB — POCT INR: INR: 3.7

## 2013-11-20 ENCOUNTER — Ambulatory Visit: Payer: Medicare Other | Admitting: Interventional Cardiology

## 2013-11-27 ENCOUNTER — Ambulatory Visit (INDEPENDENT_AMBULATORY_CARE_PROVIDER_SITE_OTHER): Payer: Medicare Other

## 2013-11-27 DIAGNOSIS — I6789 Other cerebrovascular disease: Secondary | ICD-10-CM

## 2013-11-27 DIAGNOSIS — Z5181 Encounter for therapeutic drug level monitoring: Secondary | ICD-10-CM

## 2013-11-27 LAB — POCT INR: INR: 2.6

## 2013-12-09 ENCOUNTER — Other Ambulatory Visit: Payer: Self-pay | Admitting: Interventional Cardiology

## 2013-12-23 ENCOUNTER — Telehealth: Payer: Self-pay | Admitting: Interventional Cardiology

## 2013-12-23 NOTE — Telephone Encounter (Signed)
Patient having trouble with transportation.  She is actually due to see Dr. Irish Lack next week for office visit, so will get PT/INR done at that time.  Patient aware.

## 2013-12-23 NOTE — Telephone Encounter (Signed)
New Prob   Pt calling to see if she is able to have her Coumadin checked at another location this Friday. Please call.

## 2013-12-25 ENCOUNTER — Ambulatory Visit (INDEPENDENT_AMBULATORY_CARE_PROVIDER_SITE_OTHER): Payer: Medicare Other | Admitting: Pharmacist

## 2013-12-25 DIAGNOSIS — M899 Disorder of bone, unspecified: Secondary | ICD-10-CM | POA: Diagnosis not present

## 2013-12-25 DIAGNOSIS — E7800 Pure hypercholesterolemia, unspecified: Secondary | ICD-10-CM | POA: Diagnosis not present

## 2013-12-25 DIAGNOSIS — G2589 Other specified extrapyramidal and movement disorders: Secondary | ICD-10-CM | POA: Diagnosis not present

## 2013-12-25 DIAGNOSIS — F329 Major depressive disorder, single episode, unspecified: Secondary | ICD-10-CM | POA: Diagnosis not present

## 2013-12-25 DIAGNOSIS — M159 Polyosteoarthritis, unspecified: Secondary | ICD-10-CM | POA: Diagnosis not present

## 2013-12-25 DIAGNOSIS — Z23 Encounter for immunization: Secondary | ICD-10-CM | POA: Diagnosis not present

## 2013-12-25 DIAGNOSIS — F3289 Other specified depressive episodes: Secondary | ICD-10-CM | POA: Diagnosis not present

## 2013-12-25 DIAGNOSIS — E559 Vitamin D deficiency, unspecified: Secondary | ICD-10-CM | POA: Diagnosis not present

## 2013-12-25 DIAGNOSIS — I6789 Other cerebrovascular disease: Secondary | ICD-10-CM

## 2013-12-25 DIAGNOSIS — Z5181 Encounter for therapeutic drug level monitoring: Secondary | ICD-10-CM

## 2013-12-25 DIAGNOSIS — M949 Disorder of cartilage, unspecified: Secondary | ICD-10-CM | POA: Diagnosis not present

## 2013-12-25 DIAGNOSIS — I4891 Unspecified atrial fibrillation: Secondary | ICD-10-CM | POA: Diagnosis not present

## 2013-12-25 DIAGNOSIS — I10 Essential (primary) hypertension: Secondary | ICD-10-CM | POA: Diagnosis not present

## 2013-12-25 LAB — PROTIME-INR: INR: 1.9 — AB (ref 0.9–1.1)

## 2014-01-01 ENCOUNTER — Ambulatory Visit (INDEPENDENT_AMBULATORY_CARE_PROVIDER_SITE_OTHER): Payer: Medicare Other

## 2014-01-01 ENCOUNTER — Encounter: Payer: Self-pay | Admitting: Interventional Cardiology

## 2014-01-01 ENCOUNTER — Ambulatory Visit (INDEPENDENT_AMBULATORY_CARE_PROVIDER_SITE_OTHER): Payer: Medicare Other | Admitting: Interventional Cardiology

## 2014-01-01 VITALS — BP 126/75 | HR 75 | Ht 64.0 in | Wt 140.0 lb

## 2014-01-01 DIAGNOSIS — I6789 Other cerebrovascular disease: Secondary | ICD-10-CM

## 2014-01-01 DIAGNOSIS — I480 Paroxysmal atrial fibrillation: Secondary | ICD-10-CM

## 2014-01-01 DIAGNOSIS — I10 Essential (primary) hypertension: Secondary | ICD-10-CM | POA: Diagnosis not present

## 2014-01-01 DIAGNOSIS — E7800 Pure hypercholesterolemia, unspecified: Secondary | ICD-10-CM

## 2014-01-01 DIAGNOSIS — I4891 Unspecified atrial fibrillation: Secondary | ICD-10-CM | POA: Diagnosis not present

## 2014-01-01 DIAGNOSIS — Z5181 Encounter for therapeutic drug level monitoring: Secondary | ICD-10-CM

## 2014-01-01 DIAGNOSIS — R0602 Shortness of breath: Secondary | ICD-10-CM

## 2014-01-01 DIAGNOSIS — E782 Mixed hyperlipidemia: Secondary | ICD-10-CM | POA: Insufficient documentation

## 2014-01-01 LAB — POCT INR: INR: 1.4

## 2014-01-01 NOTE — Progress Notes (Signed)
Patient ID: Renee Adams, female   DOB: 07-29-1933, 80 y.o.   MRN: [id number]    [address] [place], [place]  [address] Phone: [phone] Fax:  [phone]  Date:  01/01/2014   ID:  Renee Adams, DOB 1933-09-05, MRN [id number]  PCP:  Donnie Coffin, MD      History of Present Illness: Renee Adams is a 80 y.o. female who had a strep infection of a hip replacement in 2013.  She had a hip fx after a fall in 5/14.   Atrial Fibrillation F/U:   c/o Shortness of breath exacerbated by activity.  Denies : Chest pain.  Dizziness.  Leg edema.  Orthopnea.  Palpitations.  Syncope.   Minimal exercise due to foot everting..   Wt Readings from Last 3 Encounters:  01/01/14 140 lb (63.504 kg)  09/14/13 143 lb 8 oz (65.091 kg)  12/10/12 152 lb 6.4 oz (69.128 kg)     Past Medical History  Diagnosis Date  . GERD (gastroesophageal reflux disease)   . Glaucoma   . Cancer   . Hypertension   . Depression     Current Outpatient Prescriptions  Medication Sig Dispense Refill  . ALPRAZolam (XANAX) 0.25 MG tablet Take 4 tablets (1 mg total) by mouth every 4 (four) hours as needed for anxiety.  40 tablet  0  . amLODipine (NORVASC) 5 MG tablet Take 1 tablet (5 mg total) by mouth daily.  30 tablet  1  . atorvastatin (LIPITOR) 10 MG tablet Take 1 tablet (10 mg total) by mouth daily.  30 tablet  1  . buPROPion (WELLBUTRIN XL) 300 MG 24 hr tablet Take 1 tablet (300 mg total) by mouth daily.  30 tablet  1  . calcium carbonate 200 MG capsule Take 3 capsules (600 mg total) by mouth 2 (two) times daily with a meal.  60 capsule  1  . ergocalciferol (VITAMIN D2) 50000 UNITS capsule Take 50,000 Units by mouth once a week.      Marland Kitchen HYDROcodone-acetaminophen (NORCO/VICODIN) 5-325 MG per tablet Take 1-2 tablets by mouth every 6 (six) hours as needed for pain.  90 tablet  0  . latanoprost (XALATAN) 0.005 % ophthalmic solution Place 1 drop into both eyes at bedtime.  2.5 mL  12  .  methocarbamol (ROBAXIN) 500 MG tablet Take 1 tablet (500 mg total) by mouth every 6 (six) hours as needed.  60 tablet  0  . rOPINIRole (REQUIP) 1 MG tablet Take 1 tablet (1 mg total) by mouth at bedtime as needed. For restless legs  30 tablet  0  . sertraline (ZOLOFT) 100 MG tablet Take 1.5 tablets (150 mg total) by mouth daily.  30 tablet  1  . warfarin (COUMADIN) 2 MG tablet Take as directed by Coumadin Clinic  40 tablet  3  . [DISCONTINUED] pramipexole (MIRAPEX) 0.125 MG tablet Take 0.125 mg by mouth 3 (three) times daily.       No current facility-administered medications for this visit.    Allergies:    Allergies  Allergen Reactions  . Dilaudid [Hydromorphone Hcl] Shortness Of Breath    Tolerates tramadol  . Morphine And Related Shortness Of Breath    Tolerates tramadol  . Sulfa Antibiotics Other (See Comments)    "Made me drunk";  wobbly  . Keflex [Cephalexin] Nausea Only  . Levaquin [Levofloxacin] Hives and Rash    Social History:  The patient  reports that she has never smoked. She has  never used smokeless tobacco. She reports that she does not drink alcohol or use illicit drugs.   Family History:  The patient's family history includes Cancer in her mother; Diabetes in her sister; Heart failure in her father; Osteoarthritis in her brother.   ROS:  Please see the history of present illness.  No nausea, vomiting.  No fevers, chills.  No focal weakness.  No dysuria.   All other systems reviewed and negative.   PHYSICAL EXAM: VS:  BP 126/75  Pulse 75  Ht 5\' 4"  (1.626 m)  Wt 140 lb (63.504 kg)  BMI 24.02 kg/m2 Well nourished, well developed, in no acute distress HEENT: normal Neck: no JVD, no carotid bruits Cardiac:  normal S1, S2; RRR;  Lungs:  clear to auscultation bilaterally, no wheezing, rhonchi or rales Abd: soft, nontender, no hepatomegaly Ext: no edema Skin: warm and dry Neuro:   no focal abnormalities noted  EKG:     NSR, NSST  ASSESSMENT AND PLAN:  Atrial  fibrillation  Notes: Maintaining NSR. Coumadin for stroke prevention. 2. Hypertension, essential  Continue Amlodipine Besylate Tablet, 5 MG, 1 tablet, Orally, Once a day Notes: Controlled.  3. Pure hypercholesterolemia  Continue Atorvastatin Calcium Tablet, 10 MG, 1 tablet, Orally, Once a day Notes: LDL in 89 at last check in 2014.  Will check lipids.    4. SHOB: No obvious fluid overload.  WIll check BNP.  WOuld not pursue w/u of ASD/PFO question since her activity is limited mostly by her orthopedic issues.  If BNP increased, will add diuretic.  In 2012, echo showed normal LV function and normal valvular function.   Signed, Mina Marble, MD, [facility] 01/01/2014 11:30 AM

## 2014-01-01 NOTE — Patient Instructions (Signed)
Your physician recommends that you return for a FASTING lipid profile, hepatic and BNP at next coumadin check.  Your physician recommends that you continue on your current medications as directed. Please refer to the Current Medication list given to you today.  Your physician wants you to follow-up in: 1 year with Dr. Irish Lack. You will receive a reminder letter in the mail two months in advance. If you don't receive a letter, please call our office to schedule the follow-up appointment.

## 2014-01-11 ENCOUNTER — Ambulatory Visit (INDEPENDENT_AMBULATORY_CARE_PROVIDER_SITE_OTHER): Payer: Medicare Other

## 2014-01-11 ENCOUNTER — Other Ambulatory Visit (INDEPENDENT_AMBULATORY_CARE_PROVIDER_SITE_OTHER): Payer: Medicare Other

## 2014-01-11 DIAGNOSIS — R0602 Shortness of breath: Secondary | ICD-10-CM

## 2014-01-11 DIAGNOSIS — Z5181 Encounter for therapeutic drug level monitoring: Secondary | ICD-10-CM

## 2014-01-11 DIAGNOSIS — E7800 Pure hypercholesterolemia, unspecified: Secondary | ICD-10-CM

## 2014-01-11 DIAGNOSIS — I6789 Other cerebrovascular disease: Secondary | ICD-10-CM | POA: Diagnosis not present

## 2014-01-11 LAB — LIPID PANEL
Cholesterol: 202 mg/dL — ABNORMAL HIGH (ref 0–200)
HDL: 49.7 mg/dL (ref >39.00)
LDL Cholesterol: 121 mg/dL — ABNORMAL HIGH (ref 0–99)
NonHDL: 152.3
TRIGLYCERIDES: 156 mg/dL — AB (ref 0.0–149.0)
Total CHOL/HDL Ratio: 4
VLDL: 31.2 mg/dL (ref 0.0–40.0)

## 2014-01-11 LAB — HEPATIC FUNCTION PANEL
ALBUMIN: 4.2 g/dL (ref 3.5–5.2)
ALK PHOS: 78 U/L (ref 39–117)
ALT: 9 U/L (ref 0–35)
AST: 19 U/L (ref 0–37)
Bilirubin, Direct: 0 mg/dL (ref 0.0–0.3)
TOTAL PROTEIN: 7.3 g/dL (ref 6.0–8.3)
Total Bilirubin: 0.4 mg/dL (ref 0.2–1.2)

## 2014-01-11 LAB — BRAIN NATRIURETIC PEPTIDE: Pro B Natriuretic peptide (BNP): 43 pg/mL (ref 0.0–100.0)

## 2014-01-11 LAB — POCT INR: INR: 1.8

## 2014-01-18 ENCOUNTER — Other Ambulatory Visit: Payer: Self-pay | Admitting: Cardiology

## 2014-01-18 ENCOUNTER — Telehealth: Payer: Self-pay | Admitting: Interventional Cardiology

## 2014-01-18 DIAGNOSIS — E782 Mixed hyperlipidemia: Secondary | ICD-10-CM

## 2014-01-18 NOTE — Telephone Encounter (Signed)
New message ° ° ° °Returning Amy's call °

## 2014-01-18 NOTE — Telephone Encounter (Signed)
Returned pt's call.

## 2014-01-27 ENCOUNTER — Ambulatory Visit (INDEPENDENT_AMBULATORY_CARE_PROVIDER_SITE_OTHER): Payer: Medicare Other | Admitting: Pharmacist

## 2014-01-27 DIAGNOSIS — I6789 Other cerebrovascular disease: Secondary | ICD-10-CM | POA: Diagnosis not present

## 2014-01-27 DIAGNOSIS — Z5181 Encounter for therapeutic drug level monitoring: Secondary | ICD-10-CM | POA: Diagnosis not present

## 2014-01-27 LAB — POCT INR: INR: 3.1

## 2014-02-17 ENCOUNTER — Ambulatory Visit (INDEPENDENT_AMBULATORY_CARE_PROVIDER_SITE_OTHER): Payer: Medicare Other | Admitting: *Deleted

## 2014-02-17 DIAGNOSIS — Z5181 Encounter for therapeutic drug level monitoring: Secondary | ICD-10-CM | POA: Diagnosis not present

## 2014-02-17 DIAGNOSIS — I6789 Other cerebrovascular disease: Secondary | ICD-10-CM | POA: Diagnosis not present

## 2014-02-17 LAB — POCT INR: INR: 2.7

## 2014-03-17 ENCOUNTER — Other Ambulatory Visit: Payer: Self-pay | Admitting: Oncology

## 2014-03-17 ENCOUNTER — Ambulatory Visit (INDEPENDENT_AMBULATORY_CARE_PROVIDER_SITE_OTHER): Payer: Medicare Other | Admitting: Pharmacist

## 2014-03-17 DIAGNOSIS — I6789 Other cerebrovascular disease: Secondary | ICD-10-CM | POA: Diagnosis not present

## 2014-03-17 DIAGNOSIS — Z5181 Encounter for therapeutic drug level monitoring: Secondary | ICD-10-CM | POA: Diagnosis not present

## 2014-03-17 DIAGNOSIS — N6489 Other specified disorders of breast: Secondary | ICD-10-CM

## 2014-03-17 LAB — POCT INR: INR: 2.5

## 2014-04-13 ENCOUNTER — Other Ambulatory Visit: Payer: Self-pay | Admitting: Oncology

## 2014-04-13 ENCOUNTER — Other Ambulatory Visit: Payer: Self-pay

## 2014-04-13 DIAGNOSIS — N6489 Other specified disorders of breast: Secondary | ICD-10-CM

## 2014-04-14 ENCOUNTER — Ambulatory Visit (INDEPENDENT_AMBULATORY_CARE_PROVIDER_SITE_OTHER): Payer: Medicare Other | Admitting: Pharmacist

## 2014-04-14 DIAGNOSIS — Z5181 Encounter for therapeutic drug level monitoring: Secondary | ICD-10-CM

## 2014-04-14 DIAGNOSIS — I6789 Other cerebrovascular disease: Secondary | ICD-10-CM

## 2014-04-14 LAB — POCT INR: INR: 2.5

## 2014-04-19 ENCOUNTER — Encounter (INDEPENDENT_AMBULATORY_CARE_PROVIDER_SITE_OTHER): Payer: Self-pay

## 2014-04-19 ENCOUNTER — Other Ambulatory Visit: Payer: Medicare Other

## 2014-04-19 ENCOUNTER — Ambulatory Visit
Admission: RE | Admit: 2014-04-19 | Discharge: 2014-04-19 | Disposition: A | Discharge status: Home / Self Care | Payer: Medicare Other | Source: Ambulatory Visit | Attending: Oncology | Admitting: Oncology

## 2014-04-19 DIAGNOSIS — N6489 Other specified disorders of breast: Secondary | ICD-10-CM | POA: Diagnosis not present

## 2014-04-28 DIAGNOSIS — F329 Major depressive disorder, single episode, unspecified: Secondary | ICD-10-CM | POA: Diagnosis not present

## 2014-04-28 DIAGNOSIS — M779 Enthesopathy, unspecified: Secondary | ICD-10-CM | POA: Diagnosis not present

## 2014-05-02 ENCOUNTER — Encounter: Payer: Self-pay | Admitting: *Deleted

## 2014-05-04 ENCOUNTER — Other Ambulatory Visit: Payer: Self-pay | Admitting: Interventional Cardiology

## 2014-05-26 ENCOUNTER — Ambulatory Visit (INDEPENDENT_AMBULATORY_CARE_PROVIDER_SITE_OTHER): Payer: Medicare Other | Admitting: Pharmacist

## 2014-05-26 DIAGNOSIS — Z5181 Encounter for therapeutic drug level monitoring: Secondary | ICD-10-CM

## 2014-05-26 DIAGNOSIS — I6789 Other cerebrovascular disease: Secondary | ICD-10-CM | POA: Diagnosis not present

## 2014-05-26 LAB — POCT INR: INR: 2

## 2014-05-27 ENCOUNTER — Ambulatory Visit (INDEPENDENT_AMBULATORY_CARE_PROVIDER_SITE_OTHER): Payer: Medicare Other | Admitting: Neurology

## 2014-05-27 ENCOUNTER — Encounter: Payer: Self-pay | Admitting: Neurology

## 2014-05-27 VITALS — BP 144/89 | HR 80 | Temp 97.8°F | Resp 14 | Ht 63.0 in | Wt 142.4 lb

## 2014-05-27 DIAGNOSIS — M6289 Other specified disorders of muscle: Secondary | ICD-10-CM | POA: Diagnosis not present

## 2014-05-27 DIAGNOSIS — R29898 Other symptoms and signs involving the musculoskeletal system: Secondary | ICD-10-CM | POA: Insufficient documentation

## 2014-05-27 NOTE — Progress Notes (Signed)
Reason for visit: Left hand weakness  Renee Adams is a 79 y.o. female  History of present illness:  Renee Adams is a 80 year old right-handed white female with a history of severe degenerative arthritis affecting the arms and legs. The patient has been seen by Dr. Estanislado Pandy in the past. The patient was screened for rheumatoid arthritis, but ultimately, she was felt not to have this disease. She has not been seen by her rheumatologist in over 4 years. The patient has noted that within the last 2 months, she has noted some gradual onset of dysfunction involving the left hand more so than the right hand. The patient has had onset of Dupuytren's contracture in both palms, and she has had inability to fully extend the fingers on the left hand. The patient has had lateral deviation of the fingers of the hands bilaterally, with swelling of the MP joints of the hands bilaterally without significant discomfort. The patient does have some dull achy pains in these joints, but no significant pain. The patient reports no numbness or tingling sensations of the arms or hands, and she denies any neck pain. 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 The patient was seen by her primary care physician, and she was referred to this office for further evaluation.  Past Medical History  Diagnosis Date  . GERD (gastroesophageal reflux disease)   . Glaucoma   . Hypertension   . Depression   . Glaucoma   . Gait disorder   . Cancer     breast, left  . IBS (irritable bowel syndrome)   . History of stroke   . ASD (atrial septal defect)   . S/P bunionectomy     bilateral    Past Surgical History  Procedure Laterality Date  . Total knee arthroplasty Bilateral   . Total hip arthroplasty Bilateral   . Mastectomy  1996    left breast lumpectomy   . Incision and drainage hip  03/31/2012    Procedure: IRRIGATION AND DEBRIDEMENT HIP WITH POLY EXCHANGE;  Surgeon:  Rudean Haskell, MD;  Location: [facility];  Service: Orthopedics;  Laterality: Left;  . Orif femur fracture Right 11/30/2012    Procedure: OPEN REDUCTION INTERNAL FIXATION (ORIF) DISTAL FEMUR FRACTURE;  Surgeon: Vickey Huger, MD;  Location: [facility];  Service: Orthopedics;  Laterality: Right;  . Cataract extraction Bilateral   . Bladder resuspension    . Abdominal hysterectomy      Family History  Problem Relation Age of Onset  . Cancer Mother   . Heart failure Father   . Diabetes Sister   . Osteoarthritis Brother     Social history:  reports that she has never smoked. She has never used smokeless tobacco. She reports that she does not drink alcohol or use illicit drugs.  Medications:  Current Outpatient Prescriptions on File Prior to Visit  Medication Sig Dispense Refill  . ALPRAZolam (XANAX) 0.25 MG tablet Take 4 tablets (1 mg total) by mouth every 4 (four) hours as needed for anxiety. 40 tablet 0  . amLODipine (NORVASC) 5 MG tablet Take 1 tablet (5 mg total) by mouth daily. 30 tablet 1  . atorvastatin (LIPITOR) 10 MG tablet Take 1 tablet (10 mg total) by mouth daily. 30 tablet 1  . buPROPion (WELLBUTRIN XL) 300 MG 24 hr tablet Take 1 tablet (300 mg total) by mouth daily. 30 tablet 1  . calcium carbonate 200 MG capsule Take 3 capsules (  600 mg total) by mouth 2 (two) times daily with a meal. 60 capsule 1  . ergocalciferol (VITAMIN D2) 50000 UNITS capsule Take 50,000 Units by mouth once a week.    . latanoprost (XALATAN) 0.005 % ophthalmic solution Place 1 drop into both eyes at bedtime. 2.5 mL 12  . sertraline (ZOLOFT) 100 MG tablet Take 1.5 tablets (150 mg total) by mouth daily. 30 tablet 1  . warfarin (COUMADIN) 2 MG tablet TAKE AS DIRECTED BY COUMADIN CLINIC 40 tablet 3  . HYDROcodone-acetaminophen (NORCO/VICODIN) 5-325 MG per tablet Take 1-2 tablets by mouth every 6 (six) hours as needed for pain. 90 tablet 0  . methocarbamol (ROBAXIN) 500 MG tablet Take 1 tablet (500 mg total) by mouth  every 6 (six) hours as needed. 60 tablet 0  . rOPINIRole (REQUIP) 1 MG tablet Take 1 tablet (1 mg total) by mouth at bedtime as needed. For restless legs 30 tablet 0  . [DISCONTINUED] pramipexole (MIRAPEX) 0.125 MG tablet Take 0.125 mg by mouth 3 (three) times daily.     No current facility-administered medications on file prior to visit.      Allergies  Allergen Reactions  . Dilaudid [Hydromorphone Hcl] Shortness Of Breath    Tolerates tramadol  . Morphine And Related Shortness Of Breath    Tolerates tramadol  . Sulfa Antibiotics Other (See Comments)    "Made me drunk";  wobbly  . Keflex [Cephalexin] Nausea Only  . Levaquin [Levofloxacin] Hives and Rash    ROS:  Out of a complete 14 system review of symptoms, the patient complains only of the following symptoms, and all other reviewed systems are negative.  Hearing loss Glaucoma Joint pain, joint swelling, muscle cramps Depression, too much sleep, insomnia Sleepiness, restless legs  Blood pressure 144/89, pulse 80, temperature 97.8 F (36.6 C), temperature source Oral, resp. rate 14, height 5\' 3"  (1.6 m), weight 142 lb 6.4 oz (64.592 kg).  Physical Exam  General: The patient is alert and cooperative at the time of the examination.  Eyes: Pupils are equal, round, and reactive to light. Discs are flat bilaterally.  Neck: The neck is supple, no carotid bruits are noted.  Respiratory: The respiratory examination is clear.  Cardiovascular: The cardiovascular examination reveals a regular rate and rhythm, no obvious murmurs or rubs are noted.  Neuromuscular: Enlargement of the MP joints are seen on both hands, and there is lateral deviation of the fingers on the hands bilaterally, with evidence of Dupuytren's contracture on both palms.  Skin: Extremities are with one plus edema below the knees bilaterally.  Neurologic Exam  Mental status: The patient is alert and oriented x 3 at the time of the examination. The patient  has apparent normal recent and remote memory, with an apparently normal attention span and concentration ability.  Cranial nerves: Facial symmetry is present. There is good sensation of the face to pinprick and soft touch bilaterally. The strength of the facial muscles and the muscles to head turning and shoulder shrug are normal bilaterally. Speech is well enunciated, no aphasia or dysarthria is noted. Extraocular movements are full. Visual fields are full. The tongue is midline, and the patient has symmetric elevation of the soft palate. No obvious hearing deficits are noted.  Motor: The motor testing reveals 5 over 5 strength of all 4 extremities, with exception that there is some weakness of finger extension and with the APB muscle on the left. Good symmetric motor tone is noted throughout.  Sensory: Sensory testing is  intact to pinprick, soft touch, vibration sensation, and position sense on all 4 extremities. No evidence of extinction is noted.  Coordination: Cerebellar testing reveals good finger-nose-finger and heel-to-shin bilaterally.  Gait and station: Gait is wide based, unsteady. The right foot is externally rotated with walking. The patient uses a cane for ambulation. Tandem gait is was not attempted. Romberg is negative. No drift is seen.  Reflexes: Deep tendon reflexes are symmetric, with the exception that the left biceps reflex is elevated relative to the right. Reflexes in the legs are depressed. Toes are downgoing bilaterally.   Assessment/Plan:  1. Left hand dysfunction  2. Degenerative arthritis  The patient appears to have dysfunction of the left greater than right hand associated with rheumatologic disease. The patient has developed Dupuytren's contractures of the palms bilaterally, and she has involvement of arthritis with the MP joints of the hands bilaterally. The patient appears to have features consistent with rheumatoid arthritis. The patient be set up for nerve  conduction studies on both arms, EMG evaluation on the left arm to exclude a neuropathy that may be adding to the dysfunction of the left hand. The patient will be referred back to Dr. Estanislado Pandy for reevaluation as well. The patient will follow-up for the EMG evaluation.    Jill Alexanders MD 05/27/2014 1:05 PM  [facility] [address] [place], [place] [address]  Phone [phone] Fax [phone]

## 2014-06-10 ENCOUNTER — Ambulatory Visit (INDEPENDENT_AMBULATORY_CARE_PROVIDER_SITE_OTHER): Payer: Medicare Other | Admitting: Neurology

## 2014-06-10 ENCOUNTER — Encounter (INDEPENDENT_AMBULATORY_CARE_PROVIDER_SITE_OTHER): Payer: Self-pay | Admitting: Neurology

## 2014-06-10 ENCOUNTER — Encounter: Payer: Self-pay | Admitting: Neurology

## 2014-06-10 DIAGNOSIS — M6289 Other specified disorders of muscle: Secondary | ICD-10-CM | POA: Diagnosis not present

## 2014-06-10 DIAGNOSIS — R29898 Other symptoms and signs involving the musculoskeletal system: Secondary | ICD-10-CM

## 2014-06-10 DIAGNOSIS — Z0289 Encounter for other administrative examinations: Secondary | ICD-10-CM

## 2014-06-10 NOTE — Progress Notes (Signed)
Renee Adams is a 80 year old patient with a history of degenerative arthritis of the hands. She has recently noted inability to extend the fingers on the left hand. She is being evaluated for possible neuropathy as an etiology for this.  Nerve conduction studies show evidence of bilateral mild carpal tunnel syndrome. The EMG of the left arm was unremarkable, without evidence of an overlying neuropathy or radiculopathy.  The patient likely has a combination of Dupuytren's contracture and degenerative arthritis of the left hand as an etiology for her current symptoms. She will be seeing Dr. Estanislado Pandy in the near future.

## 2014-06-10 NOTE — Procedures (Signed)
     HISTORY:   Renee Adams is a 80 year old patient with a history of degenerative arthritis of the hands bilaterally, worse on the left. The patient has developed inability to extend the fingers on the left, she is being evaluated for possible neuropathy.  NERVE CONDUCTION STUDIES:  Nerve conduction studies were performed on both upper extremities. The distal motor latencies for the median nerves were prolonged bilaterally, with a low motor amplitude on the right, normal on the left. The distal motor latencies and motor amplitudes for the ulnar nerves were normal bilaterally. The F wave latencies and nerve conduction velocities for the median and ulnar nerves were normal bilaterally. The sensory latencies for the median nerves were borderline normal on the right, slightly prolonged on the left, and slightly prolonged for the ulnar nerves bilaterally. The radial sensory latencies were at the upper limits of normal bilaterally.  EMG STUDIES:  EMG study was performed on the left upper extremity:  The first dorsal interosseous muscle reveals 2 to 3 K units with full recruitment. No fibrillations or positive waves were noted. The abductor pollicis brevis muscle reveals 2 to 3 K units with full recruitment. No fibrillations or positive waves were noted. The extensor indicis proprius muscle reveals 1 to 3 K units with full recruitment. No fibrillations or positive waves were noted. The pronator teres muscle reveals 2 to 3 K units with full recruitment. No fibrillations or positive waves were noted. The biceps muscle reveals 1 to 2 K units with full recruitment. No fibrillations or positive waves were noted. The triceps muscle reveals 2 to 4 K units with full recruitment. No fibrillations or positive waves were noted. The anterior deltoid muscle reveals 2 to 3 K units with full recruitment. No fibrillations or positive waves were noted. The cervical paraspinal muscles were tested at 2 levels. No  abnormalities of insertional activity were seen at either level tested, with exception that complex repetitive discharges were seen at the lower level. There was good relaxation.   IMPRESSION:  Nerve conduction studies done on both upper extremities shows evidence of bilateral mild carpal tunnel syndrome. There is mild bilateral sensory latency prolongation in both upper extremities, the possibility of an underlying peripheral neuropathy should be considered. EMG evaluation of the left upper extremity does not show evidence of a radial neuropathy or evidence of a cervical radiculopathy as an explanation for the left hand dysfunction.  Jill Alexanders MD 06/10/2014 1:48 PM  [facility] [address] [place], [place] [address]  Phone [phone] Fax [phone]

## 2014-06-19 ENCOUNTER — Telehealth: Payer: Self-pay | Admitting: Oncology

## 2014-06-19 NOTE — Telephone Encounter (Signed)
moved pt to HF per GM request..Marland Kitchen

## 2014-07-05 DIAGNOSIS — M25572 Pain in left ankle and joints of left foot: Secondary | ICD-10-CM | POA: Diagnosis not present

## 2014-07-05 DIAGNOSIS — M170 Bilateral primary osteoarthritis of knee: Secondary | ICD-10-CM | POA: Diagnosis not present

## 2014-07-05 DIAGNOSIS — Z79899 Other long term (current) drug therapy: Secondary | ICD-10-CM | POA: Diagnosis not present

## 2014-07-05 DIAGNOSIS — M79641 Pain in right hand: Secondary | ICD-10-CM | POA: Diagnosis not present

## 2014-07-05 DIAGNOSIS — M2550 Pain in unspecified joint: Secondary | ICD-10-CM | POA: Diagnosis not present

## 2014-07-05 DIAGNOSIS — M79642 Pain in left hand: Secondary | ICD-10-CM | POA: Diagnosis not present

## 2014-07-05 DIAGNOSIS — R5381 Other malaise: Secondary | ICD-10-CM | POA: Diagnosis not present

## 2014-07-05 DIAGNOSIS — M25571 Pain in right ankle and joints of right foot: Secondary | ICD-10-CM | POA: Diagnosis not present

## 2014-07-05 DIAGNOSIS — M160 Bilateral primary osteoarthritis of hip: Secondary | ICD-10-CM | POA: Diagnosis not present

## 2014-07-07 ENCOUNTER — Ambulatory Visit (INDEPENDENT_AMBULATORY_CARE_PROVIDER_SITE_OTHER): Payer: Medicare Other | Admitting: *Deleted

## 2014-07-07 DIAGNOSIS — Z5181 Encounter for therapeutic drug level monitoring: Secondary | ICD-10-CM

## 2014-07-07 DIAGNOSIS — I6789 Other cerebrovascular disease: Secondary | ICD-10-CM | POA: Diagnosis not present

## 2014-07-07 LAB — POCT INR: INR: 1.4

## 2014-07-21 ENCOUNTER — Ambulatory Visit (INDEPENDENT_AMBULATORY_CARE_PROVIDER_SITE_OTHER): Payer: Medicare Other | Admitting: *Deleted

## 2014-07-21 DIAGNOSIS — I6789 Other cerebrovascular disease: Secondary | ICD-10-CM | POA: Diagnosis not present

## 2014-07-21 DIAGNOSIS — Z5181 Encounter for therapeutic drug level monitoring: Secondary | ICD-10-CM

## 2014-07-21 LAB — POCT INR: INR: 1.8

## 2014-07-28 DIAGNOSIS — M79641 Pain in right hand: Secondary | ICD-10-CM | POA: Diagnosis not present

## 2014-07-28 DIAGNOSIS — M79642 Pain in left hand: Secondary | ICD-10-CM | POA: Diagnosis not present

## 2014-07-29 DIAGNOSIS — E559 Vitamin D deficiency, unspecified: Secondary | ICD-10-CM | POA: Diagnosis not present

## 2014-07-29 DIAGNOSIS — M25551 Pain in right hip: Secondary | ICD-10-CM | POA: Diagnosis not present

## 2014-07-29 DIAGNOSIS — M2550 Pain in unspecified joint: Secondary | ICD-10-CM | POA: Diagnosis not present

## 2014-07-29 DIAGNOSIS — M19071 Primary osteoarthritis, right ankle and foot: Secondary | ICD-10-CM | POA: Diagnosis not present

## 2014-07-29 DIAGNOSIS — R899 Unspecified abnormal finding in specimens from other organs, systems and tissues: Secondary | ICD-10-CM | POA: Diagnosis not present

## 2014-07-29 DIAGNOSIS — R300 Dysuria: Secondary | ICD-10-CM | POA: Diagnosis not present

## 2014-07-29 DIAGNOSIS — M19041 Primary osteoarthritis, right hand: Secondary | ICD-10-CM | POA: Diagnosis not present

## 2014-08-04 ENCOUNTER — Ambulatory Visit (INDEPENDENT_AMBULATORY_CARE_PROVIDER_SITE_OTHER): Payer: Medicare Other | Admitting: *Deleted

## 2014-08-04 DIAGNOSIS — Z5181 Encounter for therapeutic drug level monitoring: Secondary | ICD-10-CM

## 2014-08-04 DIAGNOSIS — I6789 Other cerebrovascular disease: Secondary | ICD-10-CM | POA: Diagnosis not present

## 2014-08-04 LAB — POCT INR: INR: 2.7

## 2014-08-25 ENCOUNTER — Ambulatory Visit (INDEPENDENT_AMBULATORY_CARE_PROVIDER_SITE_OTHER): Payer: Medicare Other | Admitting: *Deleted

## 2014-08-25 DIAGNOSIS — I6789 Other cerebrovascular disease: Secondary | ICD-10-CM

## 2014-08-25 DIAGNOSIS — Z5181 Encounter for therapeutic drug level monitoring: Secondary | ICD-10-CM

## 2014-08-25 DIAGNOSIS — N390 Urinary tract infection, site not specified: Secondary | ICD-10-CM | POA: Diagnosis not present

## 2014-08-25 LAB — POCT INR: INR: 2.5

## 2014-09-10 ENCOUNTER — Other Ambulatory Visit: Payer: Self-pay | Admitting: Oncology

## 2014-09-10 DIAGNOSIS — N6489 Other specified disorders of breast: Secondary | ICD-10-CM

## 2014-09-13 DIAGNOSIS — M19041 Primary osteoarthritis, right hand: Secondary | ICD-10-CM | POA: Diagnosis not present

## 2014-09-13 DIAGNOSIS — M1611 Unilateral primary osteoarthritis, right hip: Secondary | ICD-10-CM | POA: Diagnosis not present

## 2014-09-13 DIAGNOSIS — M170 Bilateral primary osteoarthritis of knee: Secondary | ICD-10-CM | POA: Diagnosis not present

## 2014-09-14 ENCOUNTER — Telehealth: Payer: Self-pay | Admitting: Interventional Cardiology

## 2014-09-14 NOTE — Telephone Encounter (Signed)
New message      Pt is on coumadin.  Can she use voltaren gel?

## 2014-09-14 NOTE — Telephone Encounter (Signed)
Returned call to patient and advised her that the Voltaren gel that her rheumatologist prescribed for arthritis is okay for her to use.  Instructed her to continue to call us with any medication changes and she verbalized understanding.

## 2014-09-15 ENCOUNTER — Other Ambulatory Visit: Payer: Self-pay | Admitting: Emergency Medicine

## 2014-09-15 DIAGNOSIS — C50912 Malignant neoplasm of unspecified site of left female breast: Secondary | ICD-10-CM

## 2014-09-16 ENCOUNTER — Telehealth: Payer: Self-pay | Admitting: Nurse Practitioner

## 2014-09-16 ENCOUNTER — Other Ambulatory Visit: Payer: Medicare Other

## 2014-09-16 ENCOUNTER — Ambulatory Visit: Payer: Medicare Other | Admitting: Oncology

## 2014-09-16 ENCOUNTER — Ambulatory Visit: Payer: Medicare Other | Admitting: Nurse Practitioner

## 2014-09-16 NOTE — Telephone Encounter (Signed)
pt cld to r/s appt-stated sick with intestinal issues. R/s & gave pt time & date of r/s appt

## 2014-09-22 ENCOUNTER — Ambulatory Visit (INDEPENDENT_AMBULATORY_CARE_PROVIDER_SITE_OTHER): Payer: Medicare Other | Admitting: Pharmacist

## 2014-09-22 DIAGNOSIS — Z5181 Encounter for therapeutic drug level monitoring: Secondary | ICD-10-CM

## 2014-09-22 DIAGNOSIS — I6789 Other cerebrovascular disease: Secondary | ICD-10-CM

## 2014-09-22 LAB — POCT INR: INR: 3

## 2014-09-23 ENCOUNTER — Ambulatory Visit (HOSPITAL_BASED_OUTPATIENT_CLINIC_OR_DEPARTMENT_OTHER): Payer: Medicare Other | Admitting: Nurse Practitioner

## 2014-09-23 ENCOUNTER — Other Ambulatory Visit (HOSPITAL_BASED_OUTPATIENT_CLINIC_OR_DEPARTMENT_OTHER): Payer: Medicare Other

## 2014-09-23 ENCOUNTER — Encounter: Payer: Self-pay | Admitting: Nurse Practitioner

## 2014-09-23 ENCOUNTER — Telehealth: Payer: Self-pay | Admitting: Oncology

## 2014-09-23 VITALS — BP 141/72 | HR 78 | Temp 98.1°F | Resp 18 | Ht 63.0 in | Wt 147.0 lb

## 2014-09-23 DIAGNOSIS — C50912 Malignant neoplasm of unspecified site of left female breast: Secondary | ICD-10-CM

## 2014-09-23 DIAGNOSIS — Z853 Personal history of malignant neoplasm of breast: Secondary | ICD-10-CM

## 2014-09-23 LAB — CBC WITH DIFFERENTIAL/PLATELET
BASO%: 2.8 % — ABNORMAL HIGH (ref 0.0–2.0)
BASOS ABS: 0.2 10*3/uL — AB (ref 0.0–0.1)
EOS%: 4.9 % (ref 0.0–7.0)
Eosinophils Absolute: 0.3 10*3/uL (ref 0.0–0.5)
HCT: 43.6 % (ref 34.8–46.6)
HGB: 14.4 g/dL (ref 11.6–15.9)
LYMPH#: 2.1 10*3/uL (ref 0.9–3.3)
LYMPH%: 35.5 % (ref 14.0–49.7)
MCH: 28.7 pg (ref 25.1–34.0)
MCHC: 33 g/dL (ref 31.5–36.0)
MCV: 87 fL (ref 79.5–101.0)
MONO#: 0.4 10*3/uL (ref 0.1–0.9)
MONO%: 6.9 % (ref 0.0–14.0)
NEUT#: 2.9 10*3/uL (ref 1.5–6.5)
NEUT%: 49.9 % (ref 38.4–76.8)
PLATELETS: 178 10*3/uL (ref 145–400)
RBC: 5.01 10*6/uL (ref 3.70–5.45)
RDW: 15.4 % — ABNORMAL HIGH (ref 11.2–14.5)
WBC: 5.8 10*3/uL (ref 3.9–10.3)

## 2014-09-23 LAB — COMPREHENSIVE METABOLIC PANEL (CC13)
ALT: 14 U/L (ref 0–55)
AST: 21 U/L (ref 5–34)
Albumin: 3.7 g/dL (ref 3.5–5.0)
Alkaline Phosphatase: 87 U/L (ref 40–150)
Anion Gap: 9 mEq/L (ref 3–11)
BUN: 13.4 mg/dL (ref 7.0–26.0)
CALCIUM: 9.3 mg/dL (ref 8.4–10.4)
CO2: 25 meq/L (ref 22–29)
Chloride: 111 mEq/L — ABNORMAL HIGH (ref 98–109)
Creatinine: 0.9 mg/dL (ref 0.6–1.1)
EGFR: 62 mL/min/{1.73_m2} — AB (ref >90)
GLUCOSE: 83 mg/dL (ref 70–140)
POTASSIUM: 4.3 meq/L (ref 3.5–5.1)
SODIUM: 145 meq/L (ref 136–145)
Total Bilirubin: 0.65 mg/dL (ref 0.20–1.20)
Total Protein: 6.7 g/dL (ref 6.4–8.3)

## 2014-09-23 NOTE — Telephone Encounter (Signed)
Gave patient avs report and appointments for march 2017

## 2014-09-23 NOTE — Progress Notes (Signed)
Hematology and Oncology Follow Up Visit  Renee Adams [id number]   [id number]  04/27/1934 81 y.o. 09/23/2014    PCP:  Donnie Coffin, MD  CHIEF COMPLAINT: Remote Hx of Left Breast Cancer CURRENT TREATMENT: observation  BREAST CANCER HISTORY: Renee Adams was originally diagnosed with a left breast carcinoma in early 1996. Tumor was T1, N0, ER and PR positive. She underwent left lumpectomy and sentinel lymph node dissection in March of 1996.  Patient was treated with tamoxifen for 5 years, then Evista for 3 years. The Evsita was then discontinued secondary to history of stroke.   Currently being followed with observation alone.  INTERIM HISTORY: Renee Adams returns alone today for routine follow up of her remote left breast carcinoma. The interval history is generally unremarkable. She denies any falls this year. Her last mammogram showed an asymmetric parenchymal tissue in the subareolar region of the right breast. She now has mammogram every 6 months to observe, the likely benign finding.s   REVIEW OF SYSTEMS: Renee Adams denies fevers, chills, nausea, vomiting, or changes in bowel or bladder habits. He appetite is healthy. She has some shortness of breath with exertion, but denies chest pain, cough, or palpitations. She has no mouth sores or rashes. She has a history of osteoarthritis and is on voltaren for the pain to her hands, hips, and knees. She ambulates with a cane, and is remarkably steady after getting her balance for the first few steps. She has no headaches, dizziness, vision changes, or unexplained weight loss. She endorses some depression but denies anxiety or suicidal ideation. A detailed review of systems is otherwise stable.  Medications:   Current Outpatient Prescriptions  Medication Sig Dispense Refill  . ALPRAZolam (XANAX) 0.25 MG tablet Take 4 tablets (1 mg total) by mouth every 4 (four) hours as needed for anxiety. 40 tablet 0  . amLODipine (NORVASC) 5 MG tablet Take 1 tablet (5  mg total) by mouth daily. 30 tablet 1  . atorvastatin (LIPITOR) 10 MG tablet Take 1 tablet (10 mg total) by mouth daily. 30 tablet 1  . buPROPion (WELLBUTRIN XL) 300 MG 24 hr tablet Take 1 tablet (300 mg total) by mouth daily. 30 tablet 1  . ergocalciferol (VITAMIN D2) 50000 UNITS capsule Take 50,000 Units by mouth once a week.    . latanoprost (XALATAN) 0.005 % ophthalmic solution Place 1 drop into both eyes at bedtime. 2.5 mL 12  . rOPINIRole (REQUIP) 1 MG tablet Take 1 tablet (1 mg total) by mouth at bedtime as needed. For restless legs 30 tablet 0  . sertraline (ZOLOFT) 100 MG tablet Take 1.5 tablets (150 mg total) by mouth daily. 30 tablet 1  . warfarin (COUMADIN) 2 MG tablet TAKE AS DIRECTED BY COUMADIN CLINIC 40 tablet 3  . calcium carbonate 200 MG capsule Take 3 capsules (600 mg total) by mouth 2 (two) times daily with a meal. (Patient not taking: Reported on 09/23/2014) 60 capsule 1  . [DISCONTINUED] pramipexole (MIRAPEX) 0.125 MG tablet Take 0.125 mg by mouth 3 (three) times daily.     No current facility-administered medications for this visit.    Allergies:  Allergies  Allergen Reactions  . Dilaudid [Hydromorphone Hcl] Shortness Of Breath    Tolerates tramadol  . Morphine And Related Shortness Of Breath    Tolerates tramadol  . Sulfa Antibiotics Other (See Comments)    "Made me drunk";  wobbly  . Keflex [Cephalexin] Nausea Only  . Levaquin [Levofloxacin] Hives and Rash    Physical Exam: Elderly  white woman in no acute distress, ambulating with the assistance of a cane Filed Vitals:   09/23/14 1320  BP: 141/72  Pulse: 78  Temp: 98.1 F (36.7 C)  Resp: 18    Body mass index is 26.05 kg/(m^2).  ECOG:  1 Filed Weights   09/23/14 1320  Weight: 147 lb (66.679 kg)   Physical Exam: Skin: warm, dry  HEENT: sclerae anicteric, conjunctivae pink, oropharynx clear. No thrush or mucositis.  Lymph Nodes: No cervical or supraclavicular lymphadenopathy  Lungs: clear to  auscultation bilaterally, no rales, wheezes, or rhonci  Heart: regular rate and rhythm  Abdomen: round, soft, non tender, positive bowel sounds  Musculoskeletal: No focal spinal tenderness, no peripheral edema, moderate kyphosis Neuro: non focal, well oriented, positive affect  Breast: right breast unremarkable. Left breast status post lumpectomy. No evidence of recurrent disease. Bilateral axillae benign.    Lab Results: Lab Results  Component Value Date   WBC 5.8 09/23/2014   HGB 14.4 09/23/2014   HCT 43.6 09/23/2014   MCV 87.0 09/23/2014   PLT 178 09/23/2014   NEUTROABS 2.9 09/23/2014     Chemistry      Component Value Date/Time   NA 145 09/23/2014 1237   NA 142 12/05/2012 1557   K 4.3 09/23/2014 1237   K 3.7 12/05/2012 1557   CL 107 12/05/2012 1557   CL 109* 09/10/2012 1315   CO2 25 09/23/2014 1237   CO2 26 12/05/2012 1557   BUN 13.4 09/23/2014 1237   BUN 16 12/05/2012 1557   CREATININE 0.9 09/23/2014 1237   CREATININE 0.80 12/05/2012 1557      Component Value Date/Time   CALCIUM 9.3 09/23/2014 1237   CALCIUM 9.2 12/05/2012 1557   ALKPHOS 87 09/23/2014 1237   ALKPHOS 78 01/11/2014 1022   AST 21 09/23/2014 1237   AST 19 01/11/2014 1022   ALT 14 09/23/2014 1237   ALT 9 01/11/2014 1022   BILITOT 0.65 09/23/2014 1237   BILITOT 0.4 01/11/2014 1022      STUDIES:  CLINICAL DATA: Short-term interval followup of a probable benign asymmetry in the subareolar region of the right breast.  EXAM: DIGITAL DIAGNOSTIC RIGHT MAMMOGRAM WITH CAD  COMPARISON: With priors peer  ACR Breast Density Category b: There are scattered areas of fibroglandular density.  FINDINGS: Asymmetric parenchymal tissue in the subareolar region of the right breast is stable compared to prior exams dating back to 01/30/2008. No new suspicious mass or malignant type microcalcifications identified.  Mammographic images were processed with CAD.  IMPRESSION: Stable probable  benign tissue in the right breast.  RECOMMENDATION: Bilateral diagnostic mammogram in March of 2016 is recommended.  I have discussed the findings and recommendations with the patient. Results were also provided in writing at the conclusion of the visit. If applicable, a reminder letter will be sent to the patient regarding the next appointment.  BI-RADS CATEGORY 3: Probably benign finding(s) - short interval follow-up suggested.   Electronically Signed  By: Lillia Mountain M.D.  On: 04/19/2014 13:00  Assessment:  81 y.o. [place] woman   (1)  status post left lumpectomy and sentinel lymph node dissection March 1996 for a T1, N0, ER/PR positive breast carcinoma.    (2)  Treated with tamoxifen for five years, then Evista for three years with the Evista discontinued secondary to stroke.    (3)  Currently being followed with observation alone.   Plan:  Tashawnda continues to do well as far as her breast cancer is concerned. She  is now 10 years out from her definitive surgery with no evidence of recurrent disease.   She is due for a 6 month follow up of her mammogram at the end of this month to reevaluate probably benign but asymmetrical tissue to her right breast.   I offered Arena the option to "graduate" from follow up treatment, but again she declined. She feels better with yearly follow up. She will return next March for labs and a follow up visit. She understands and agrees with this plan. She has been encouraged to call with any issues that might arise before her next visit here.  Laurie Panda, NP 09/23/2014

## 2014-09-24 NOTE — Addendum Note (Signed)
Addended by: Marcelino Duster on: 09/24/2014 10:34 AM   Modules accepted: Orders

## 2014-09-30 IMAGING — CR DG HIP (WITH OR WITHOUT PELVIS) 2-3V*L*
2 series · 2 of 2 positions shown · non-contrast
Comparison: 09/15/2011.

CLINICAL DATA: Fell.  Left hip pain.

LEFT HIP - COMPLETE 2+ VIEW

[t hip ap left]
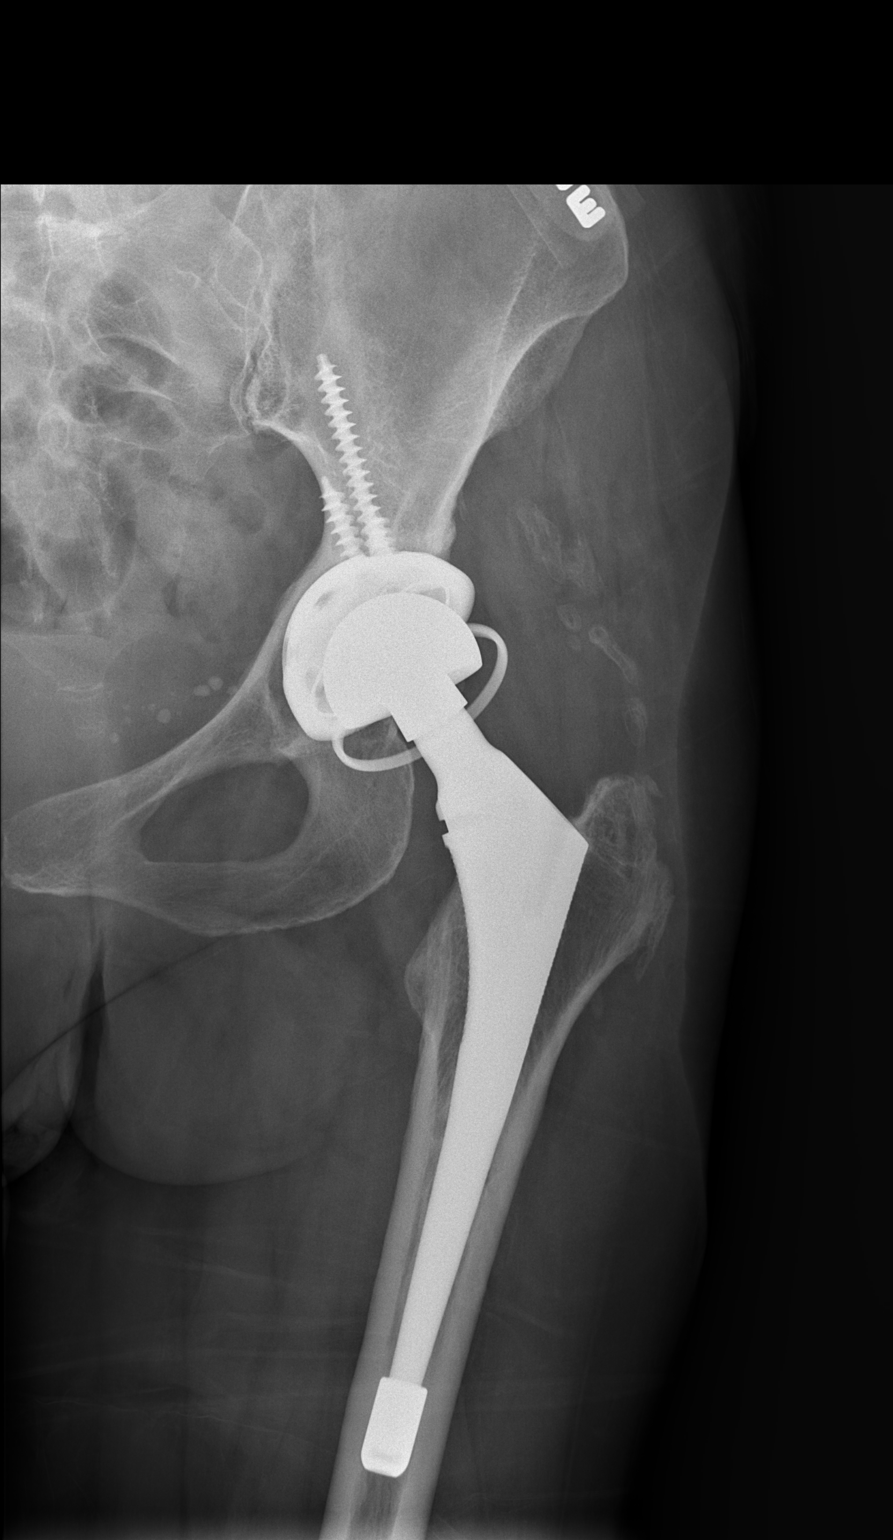

[t hip frog leg left]
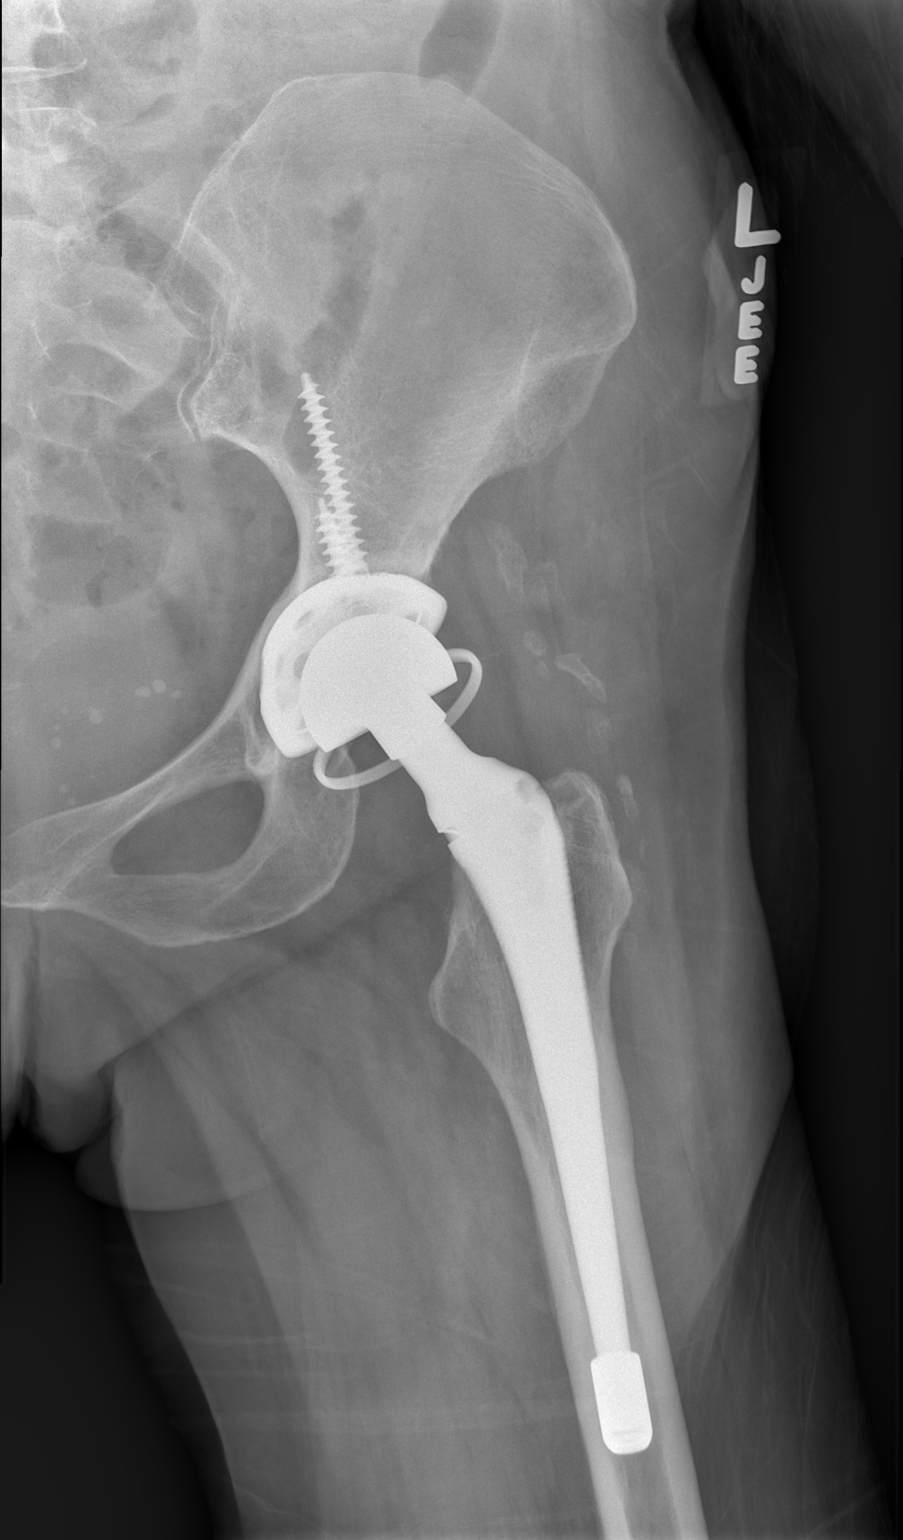

[2 of 2 positions shown; findings below may reference images not displayed]

FINDINGS: The acetabular Insert is likely loose as suggested on the
prior examination.  No acute fracture or femoral head dislocation.
IMPRESSION: No acute fracture.

## 2014-09-30 IMAGING — CT CT HEAD W/O CM
1 series · 16 of 30 positions shown, 20 images · non-contrast
Comparison: 11/26/2008.

CLINICAL DATA: Fell.  Hit head.

CT HEAD WITHOUT CONTRAST
TECHNIQUE: Contiguous axial images were obtained from the base of
the skull through the vertex without contrast.

[Series 2: head trauma 4.8 h37s · axial · 0.45mm/px · z∈[+1298,+1433]mm · 16 of 30 slices shown, 20 images]
[im 2/30  brain]
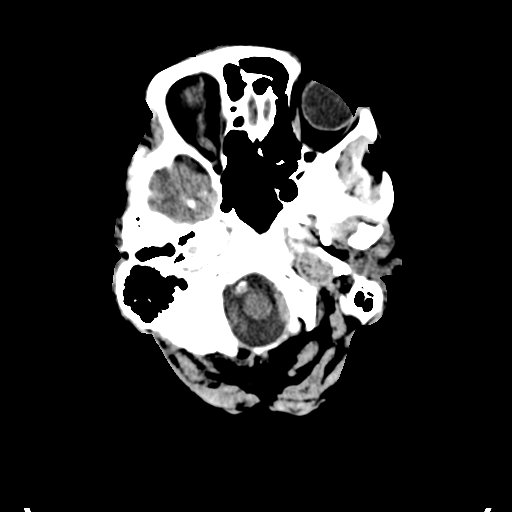
[im 2/30  bone]
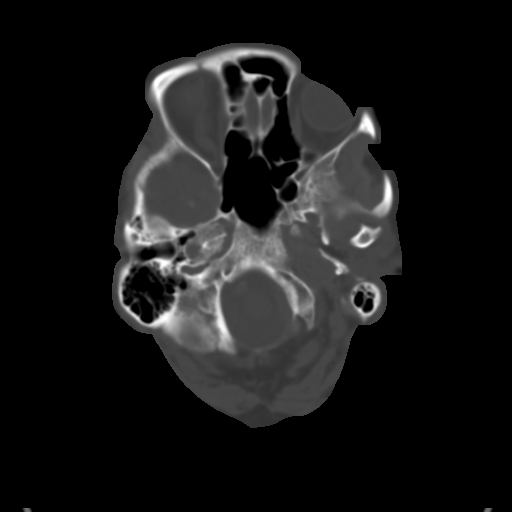
[im 4/30  brain]
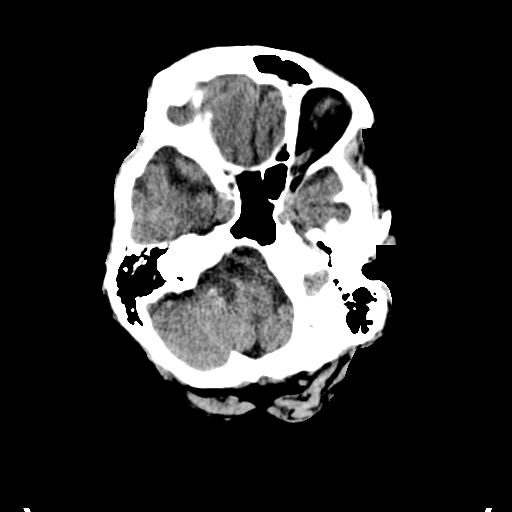
[im 6/30  brain]
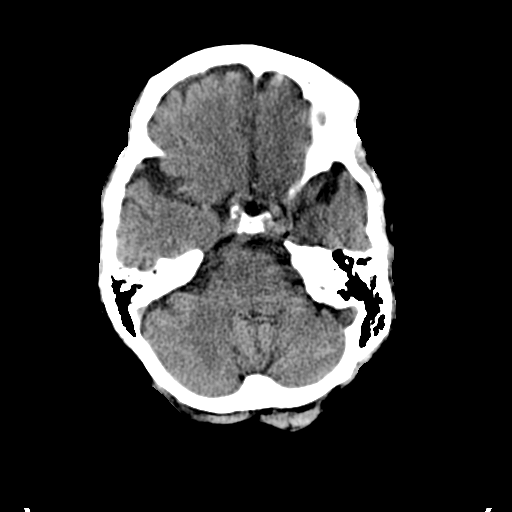
[im 8/30  brain]
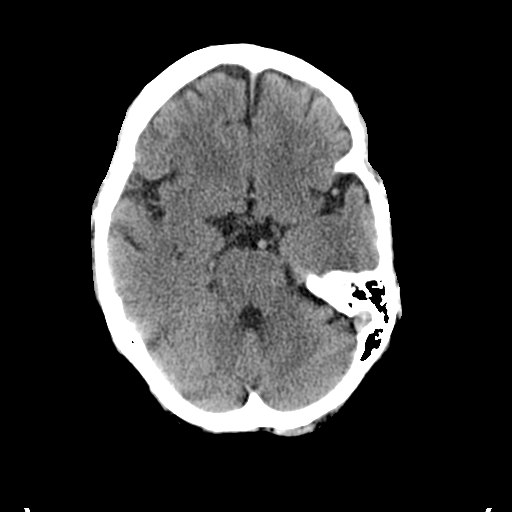
[im 9/30  brain]
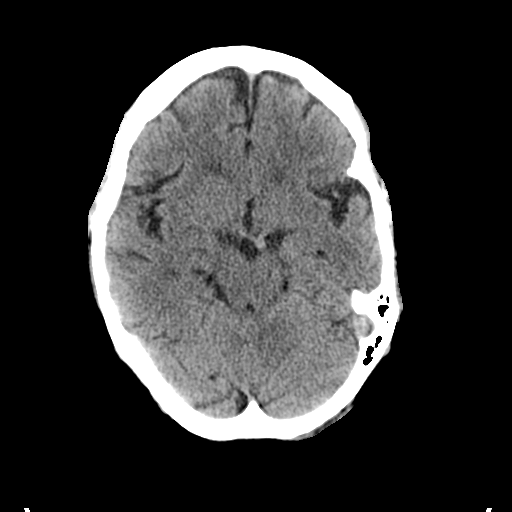
[im 9/30  bone]
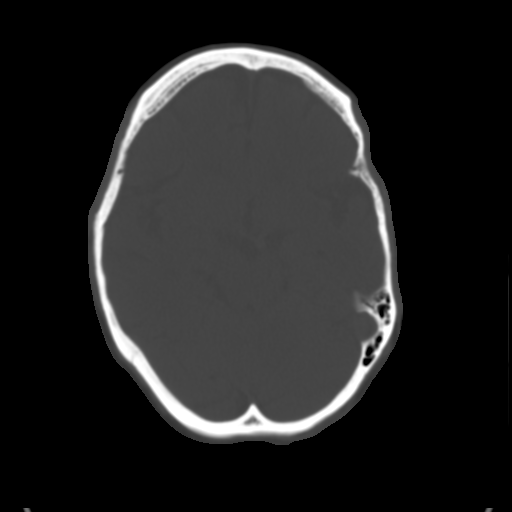
[im 11/30  brain]
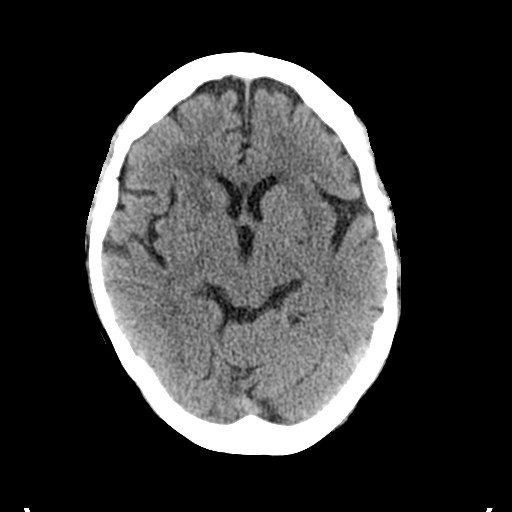
[im 13/30  brain]
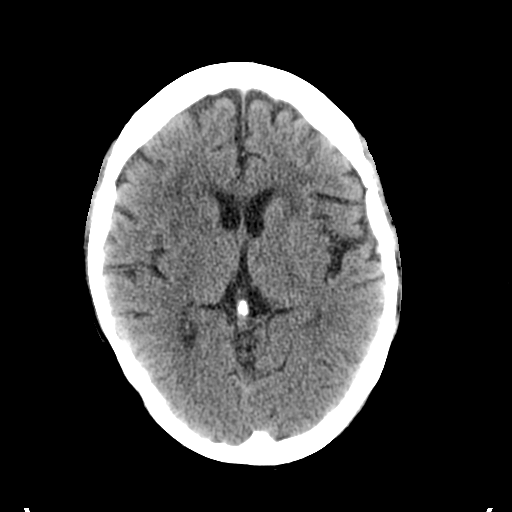
[im 15/30  brain]
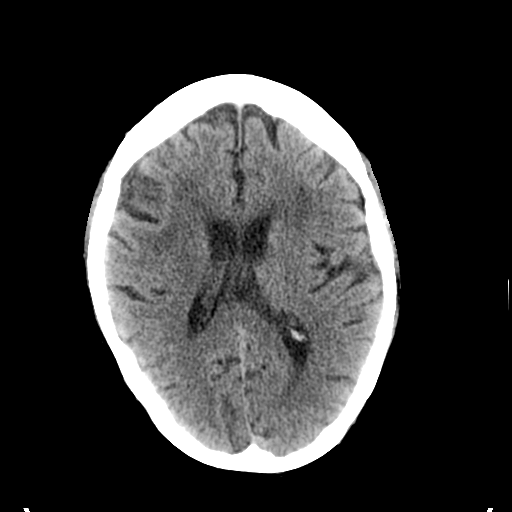
[im 16/30  brain]
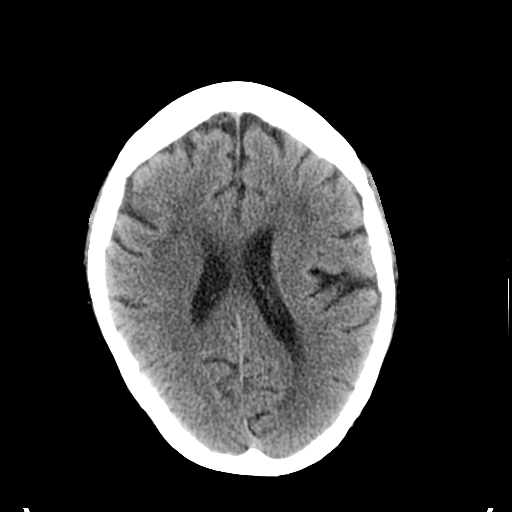
[im 16/30  bone]
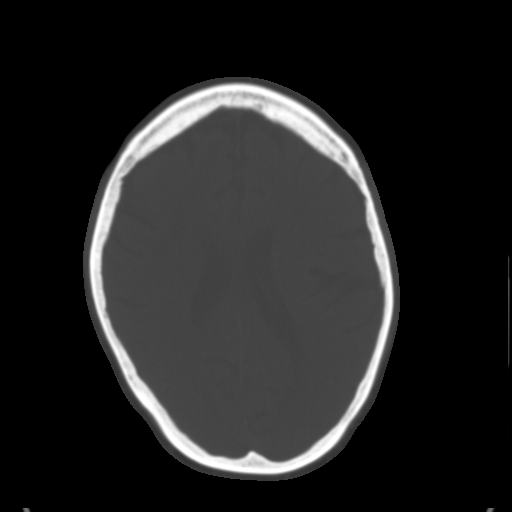
[im 18/30  brain]
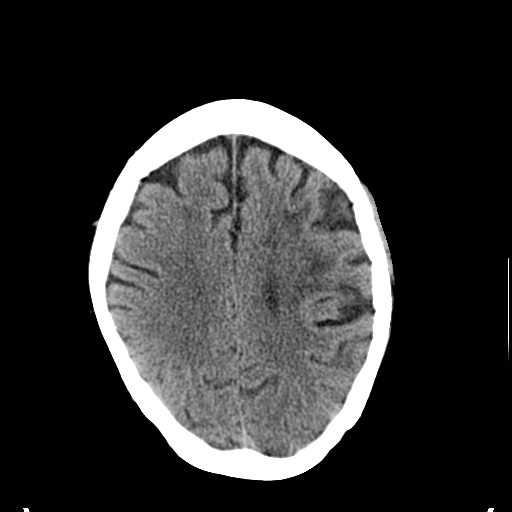
[im 20/30  brain]
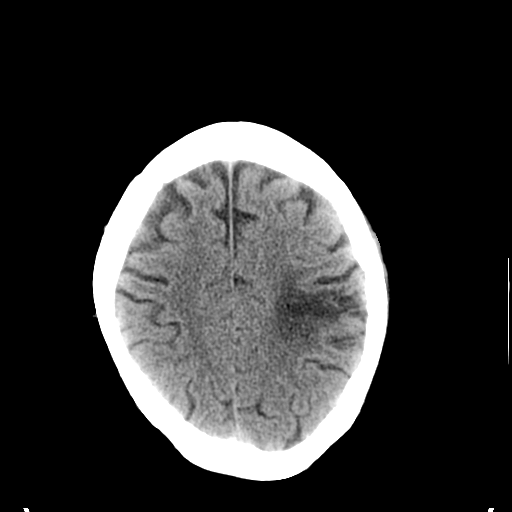
[im 22/30  brain]
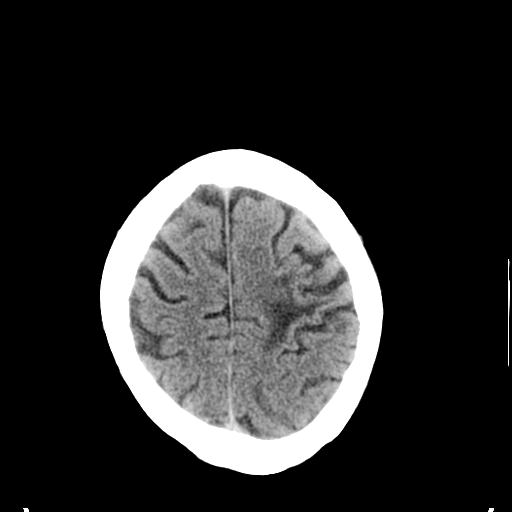
[im 23/30  brain]
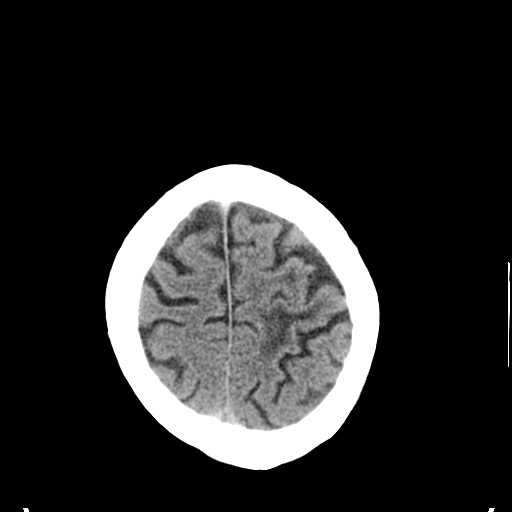
[im 23/30  bone]
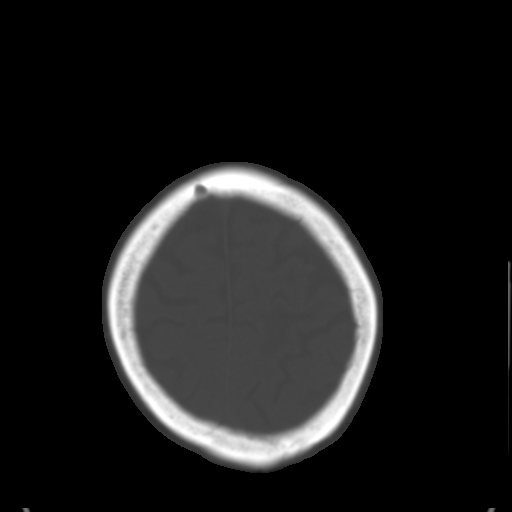
[im 25/30  brain]
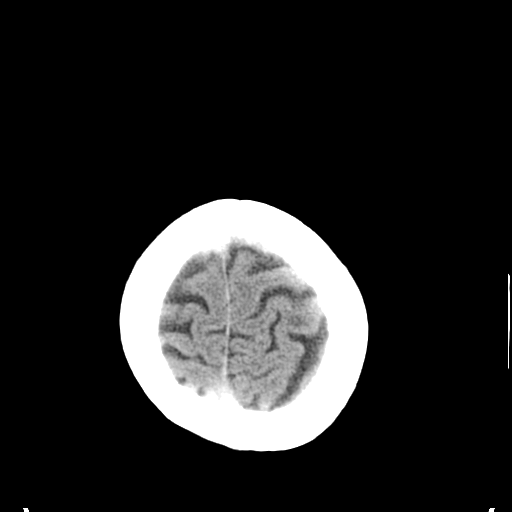
[im 27/30  brain]
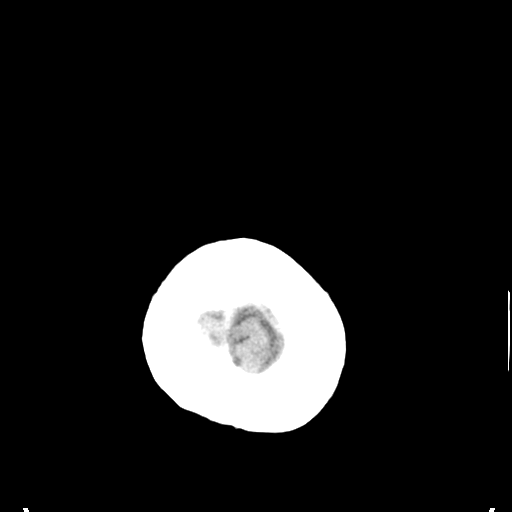
[im 29/30  brain]
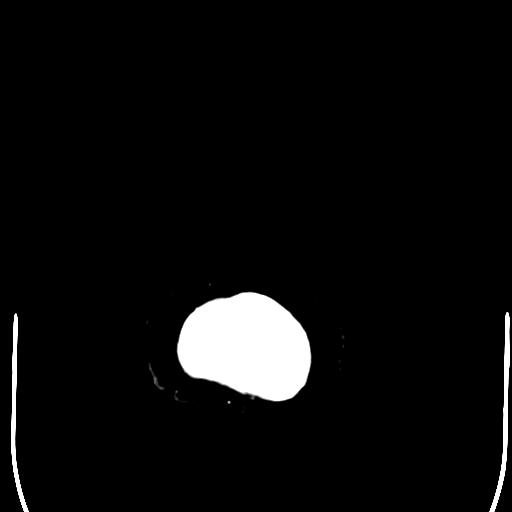

[16 of 30 positions shown; findings below may reference images not displayed]

FINDINGS: Stable area of encephalomalacia in the left high parietal
area likely due to prior infarction.  The ventricles are normal and
stable.  No extra-axial fluid collections are identified.  No CT
findings for acute hemispheric infarction and/or intracranial
hemorrhage.  No mass lesions.  The brainstem and cerebellum grossly
normal and stable.

The bony structures are intact.  No acute skull fracture.  The
paranasal sinuses and mastoid air cells are clear.  The globes are
intact.
IMPRESSION: 1.  Remote left parietal infarct.
2.  No acute intracranial findings for acute skull fracture.

## 2014-10-14 ENCOUNTER — Ambulatory Visit
Admission: RE | Admit: 2014-10-14 | Discharge: 2014-10-14 | Disposition: A | Discharge status: Home / Self Care | Payer: Medicare Other | Source: Ambulatory Visit | Attending: Oncology | Admitting: Oncology

## 2014-10-14 DIAGNOSIS — Z853 Personal history of malignant neoplasm of breast: Secondary | ICD-10-CM | POA: Diagnosis not present

## 2014-10-14 DIAGNOSIS — N6489 Other specified disorders of breast: Secondary | ICD-10-CM

## 2014-10-14 DIAGNOSIS — R928 Other abnormal and inconclusive findings on diagnostic imaging of breast: Secondary | ICD-10-CM | POA: Diagnosis not present

## 2014-10-30 ENCOUNTER — Other Ambulatory Visit: Payer: Self-pay | Admitting: Interventional Cardiology

## 2014-11-02 ENCOUNTER — Other Ambulatory Visit: Payer: Self-pay | Admitting: *Deleted

## 2014-11-02 MED ORDER — WARFARIN SODIUM 2 MG PO TABS: 2.0000 mg | ORAL_TABLET | ORAL | Status: DC

## 2014-11-03 ENCOUNTER — Ambulatory Visit (INDEPENDENT_AMBULATORY_CARE_PROVIDER_SITE_OTHER): Payer: Medicare Other | Admitting: *Deleted

## 2014-11-03 DIAGNOSIS — I6789 Other cerebrovascular disease: Secondary | ICD-10-CM

## 2014-11-03 DIAGNOSIS — Z5181 Encounter for therapeutic drug level monitoring: Secondary | ICD-10-CM | POA: Diagnosis not present

## 2014-11-03 LAB — POCT INR: INR: 4.6

## 2014-11-11 DIAGNOSIS — L309 Dermatitis, unspecified: Secondary | ICD-10-CM | POA: Diagnosis not present

## 2014-11-24 ENCOUNTER — Ambulatory Visit (INDEPENDENT_AMBULATORY_CARE_PROVIDER_SITE_OTHER): Payer: Medicare Other | Admitting: *Deleted

## 2014-11-24 DIAGNOSIS — Z5181 Encounter for therapeutic drug level monitoring: Secondary | ICD-10-CM | POA: Diagnosis not present

## 2014-11-24 DIAGNOSIS — I6789 Other cerebrovascular disease: Secondary | ICD-10-CM | POA: Diagnosis not present

## 2014-11-24 LAB — POCT INR: INR: 2.7

## 2014-12-23 ENCOUNTER — Ambulatory Visit (INDEPENDENT_AMBULATORY_CARE_PROVIDER_SITE_OTHER): Payer: Medicare Other | Admitting: *Deleted

## 2014-12-23 DIAGNOSIS — I6789 Other cerebrovascular disease: Secondary | ICD-10-CM | POA: Diagnosis not present

## 2014-12-23 DIAGNOSIS — Z5181 Encounter for therapeutic drug level monitoring: Secondary | ICD-10-CM | POA: Diagnosis not present

## 2014-12-23 LAB — POCT INR: INR: 1.3

## 2014-12-30 ENCOUNTER — Ambulatory Visit (INDEPENDENT_AMBULATORY_CARE_PROVIDER_SITE_OTHER): Payer: Medicare Other

## 2014-12-30 DIAGNOSIS — Z5181 Encounter for therapeutic drug level monitoring: Secondary | ICD-10-CM | POA: Diagnosis not present

## 2014-12-30 DIAGNOSIS — I6789 Other cerebrovascular disease: Secondary | ICD-10-CM

## 2014-12-30 LAB — POCT INR: INR: 4.2

## 2015-01-05 ENCOUNTER — Ambulatory Visit (INDEPENDENT_AMBULATORY_CARE_PROVIDER_SITE_OTHER): Payer: Medicare Other | Admitting: Interventional Cardiology

## 2015-01-05 ENCOUNTER — Encounter: Payer: Self-pay | Admitting: Interventional Cardiology

## 2015-01-05 ENCOUNTER — Ambulatory Visit (INDEPENDENT_AMBULATORY_CARE_PROVIDER_SITE_OTHER): Payer: Medicare Other | Admitting: Pharmacist

## 2015-01-05 VITALS — BP 114/72 | HR 92 | Ht 64.0 in | Wt 143.0 lb

## 2015-01-05 DIAGNOSIS — Q211 Atrial septal defect, unspecified: Secondary | ICD-10-CM

## 2015-01-05 DIAGNOSIS — E782 Mixed hyperlipidemia: Secondary | ICD-10-CM

## 2015-01-05 DIAGNOSIS — I6789 Other cerebrovascular disease: Secondary | ICD-10-CM

## 2015-01-05 DIAGNOSIS — Z5181 Encounter for therapeutic drug level monitoring: Secondary | ICD-10-CM

## 2015-01-05 DIAGNOSIS — I10 Essential (primary) hypertension: Secondary | ICD-10-CM

## 2015-01-05 LAB — POCT INR: INR: 1.7

## 2015-01-05 NOTE — Progress Notes (Signed)
Patient ID: Renee Adams, female   DOB: 09/19/1933, 81 y.o.   MRN: [id number]     Cardiology Office Note   Date:  01/05/2015   ID:  Renee Adams, DOB Jan 22, 1934, MRN [id number]  PCP:  Donnie Coffin, MD    No chief complaint on file. f/u ASD   Wt Readings from Last 3 Encounters:  01/05/15 143 lb (64.864 kg)  09/23/14 147 lb (66.679 kg)  05/27/14 142 lb 6.4 oz (64.592 kg)       History of Present Illness: Renee Adams is a 81 y.o. female  Who has osteoarthritis.  She has had both hips and knees replaced.  She now has arthritis in her hands.    She has had prior CVA and ASD was detected at that time.  She was started on Coumadin.  SHe has not had AFib recently.  She occasionally feels a pounding feeling in her chest and it lasts 2-3 minutes, about twice a month.  Coumadin for stroke prevention has been somewhat difficult to regulate.  No bleeding problems.  Palpitations are not bothersome to her.  She can only walk short distances so exercise is limited.  Her foot is everted after surgery.    Past Medical History  Diagnosis Date  . GERD (gastroesophageal reflux disease)   . Glaucoma   . Hypertension   . Depression   . Glaucoma   . Gait disorder   . Cancer     breast, left  . IBS (irritable bowel syndrome)   . History of stroke   . ASD (atrial septal defect)   . S/P bunionectomy     bilateral  . Carpal tunnel syndrome, bilateral     Past Surgical History  Procedure Laterality Date  . Total knee arthroplasty Bilateral   . Total hip arthroplasty Bilateral   . Mastectomy  1996    left breast lumpectomy   . Incision and drainage hip  03/31/2012    Procedure: IRRIGATION AND DEBRIDEMENT HIP WITH POLY EXCHANGE;  Surgeon: Rudean Haskell, MD;  Location: [facility];  Service: Orthopedics;  Laterality: Left;  . Orif femur fracture Right 11/30/2012    Procedure: OPEN REDUCTION INTERNAL FIXATION (ORIF) DISTAL FEMUR FRACTURE;  Surgeon: Vickey Huger, MD;  Location: [facility];   Service: Orthopedics;  Laterality: Right;  . Cataract extraction Bilateral   . Bladder resuspension    . Abdominal hysterectomy       Current Outpatient Prescriptions  Medication Sig Dispense Refill  . ALPRAZolam (XANAX) 0.25 MG tablet Take 4 tablets (1 mg total) by mouth every 4 (four) hours as needed for anxiety. 40 tablet 0  . amLODipine (NORVASC) 5 MG tablet Take 1 tablet (5 mg total) by mouth daily. 30 tablet 1  . atorvastatin (LIPITOR) 10 MG tablet Take 1 tablet (10 mg total) by mouth daily. 30 tablet 1  . buPROPion (WELLBUTRIN XL) 300 MG 24 hr tablet Take 1 tablet (300 mg total) by mouth daily. 30 tablet 1  . calcium carbonate 200 MG capsule Take 3 capsules (600 mg total) by mouth 2 (two) times daily with a meal. 60 capsule 1  . ergocalciferol (VITAMIN D2) 50000 UNITS capsule Take 50,000 Units by mouth once a week.    . latanoprost (XALATAN) 0.005 % ophthalmic solution Place 1 drop into both eyes at bedtime. 2.5 mL 12  . rOPINIRole (REQUIP) 1 MG tablet Take 1 tablet (1 mg total) by mouth at bedtime as needed. For restless legs 30 tablet 0  .  sertraline (ZOLOFT) 100 MG tablet Take 1.5 tablets (150 mg total) by mouth daily. 30 tablet 1  . warfarin (COUMADIN) 2 MG tablet Take 1 tablet (2 mg total) by mouth as directed. 40 tablet 3  . [DISCONTINUED] pramipexole (MIRAPEX) 0.125 MG tablet Take 0.125 mg by mouth 3 (three) times daily.     No current facility-administered medications for this visit.    Allergies:   Dilaudid; Morphine and related; Sulfa antibiotics; Keflex; and Levaquin    Social History:  The patient  reports that she has never smoked. She has never used smokeless tobacco. She reports that she does not drink alcohol or use illicit drugs.   Family History:  The patient's family history includes Cancer in her mother; Diabetes in her sister; Heart failure in her father; Osteoarthritis in her brother.    ROS:  Please see the history of present illness.   Otherwise, review  of systems are positive for joint pain.   All other systems are reviewed and negative.    PHYSICAL EXAM: VS:  BP 114/72 mmHg  Pulse 92  Ht 5\' 4"  (1.626 m)  Wt 143 lb (64.864 kg)  BMI 24.53 kg/m2 , BMI Body mass index is 24.53 kg/(m^2). GEN: Well nourished, well developed, in no acute distress HEENT: normal Neck: no JVD, carotid bruits, or masses Cardiac: RRR; no murmurs, rubs, or gallops,no edema  Respiratory:  clear to auscultation bilaterally, normal work of breathing GI: soft, nontender, nondistended, + BS MS: no deformity or atrophy, right foot everted due to hip problem Skin: warm and dry, no rash Neuro:  Strength and sensation are intact Psych: euthymic mood, full affect   EKG:   The ekg ordered today demonstrates NSR, no ST segment changes   Recent Labs: 01/11/2014: Pro B Natriuretic peptide (BNP) 43.0 09/23/2014: ALT 14; BUN 13.4; Creatinine 0.9; HGB 14.4; Platelets 178; Potassium 4.3; Sodium 145   Lipid Panel    Component Value Date/Time   CHOL 202* 01/11/2014 1022   TRIG 156.0* 01/11/2014 1022   HDL 49.70 01/11/2014 1022   CHOLHDL 4 01/11/2014 1022   VLDL 31.2 01/11/2014 1022   LDLCALC 121* 01/11/2014 1022     Other studies Reviewed: Additional studies/ records that were reviewed today with results demonstrating: Normal LV function by 2012 echo.   ASSESSMENT AND PLAN:  1. ASD: Coumadin given h/o CVA. 2. HTN: BP controlled. 3. Hyperlipidemia: CONtinue atorvastatin.  LFTs reviewed and normal.  Needs fasting lipid profile.    Current medicines are reviewed at length with the patient today.  The patient concerns regarding her medicines were addressed.  The following changes have been made:  No change  Labs/ tests ordered today include: ;lipids   Orders Placed This Encounter  Procedures  . Lipid Profile  . EKG 12-Lead    Recommend 150 minutes/week of aerobic exercise Low fat, low carb, high fiber diet recommended  Disposition:   FU in 1  year   Teresita Madura., MD  01/05/2015 1:48 PM    [facility] [address], [place], [place]  [address] Phone: [phone]; Fax: [phone]

## 2015-01-05 NOTE — Patient Instructions (Signed)
Medication Instructions:  None  Labwork: Your physician recommends that you return for lab work when you are fasting (Lipids)  Testing/Procedures: None  Follow-Up: Your physician wants you to follow-up in: 1 year with Dr. Irish Lack. You will receive a reminder letter in the mail two months in advance. If you don't receive a letter, please call our office to schedule the follow-up appointment.   Any Other Special Instructions Will Be Listed Below (If Applicable).

## 2015-01-07 ENCOUNTER — Other Ambulatory Visit: Payer: Self-pay

## 2015-01-07 MED ORDER — ATORVASTATIN CALCIUM 10 MG PO TABS: 10.0000 mg | ORAL_TABLET | Freq: Every day | ORAL | Status: DC

## 2015-01-07 MED ORDER — AMLODIPINE BESYLATE 5 MG PO TABS: 5.0000 mg | ORAL_TABLET | Freq: Every day | ORAL | Status: DC

## 2015-01-20 ENCOUNTER — Ambulatory Visit (INDEPENDENT_AMBULATORY_CARE_PROVIDER_SITE_OTHER): Payer: Medicare Other | Admitting: *Deleted

## 2015-01-20 DIAGNOSIS — Z5181 Encounter for therapeutic drug level monitoring: Secondary | ICD-10-CM

## 2015-01-20 DIAGNOSIS — I6789 Other cerebrovascular disease: Secondary | ICD-10-CM

## 2015-01-20 LAB — POCT INR: INR: 3

## 2015-02-16 ENCOUNTER — Ambulatory Visit (INDEPENDENT_AMBULATORY_CARE_PROVIDER_SITE_OTHER): Payer: Medicare Other | Admitting: *Deleted

## 2015-02-16 DIAGNOSIS — Z5181 Encounter for therapeutic drug level monitoring: Secondary | ICD-10-CM

## 2015-02-16 DIAGNOSIS — I6789 Other cerebrovascular disease: Secondary | ICD-10-CM

## 2015-02-16 LAB — POCT INR: INR: 4.2

## 2015-03-03 ENCOUNTER — Ambulatory Visit (INDEPENDENT_AMBULATORY_CARE_PROVIDER_SITE_OTHER): Payer: Medicare Other | Admitting: *Deleted

## 2015-03-03 DIAGNOSIS — Z5181 Encounter for therapeutic drug level monitoring: Secondary | ICD-10-CM

## 2015-03-03 DIAGNOSIS — I6789 Other cerebrovascular disease: Secondary | ICD-10-CM | POA: Diagnosis not present

## 2015-03-03 LAB — POCT INR: INR: 2.4

## 2015-03-09 DIAGNOSIS — L708 Other acne: Secondary | ICD-10-CM | POA: Diagnosis not present

## 2015-03-09 DIAGNOSIS — Z1283 Encounter for screening for malignant neoplasm of skin: Secondary | ICD-10-CM | POA: Diagnosis not present

## 2015-03-09 DIAGNOSIS — L219 Seborrheic dermatitis, unspecified: Secondary | ICD-10-CM | POA: Diagnosis not present

## 2015-03-24 ENCOUNTER — Ambulatory Visit (INDEPENDENT_AMBULATORY_CARE_PROVIDER_SITE_OTHER): Payer: Medicare Other | Admitting: *Deleted

## 2015-03-24 DIAGNOSIS — I6789 Other cerebrovascular disease: Secondary | ICD-10-CM | POA: Diagnosis not present

## 2015-03-24 DIAGNOSIS — Z5181 Encounter for therapeutic drug level monitoring: Secondary | ICD-10-CM | POA: Diagnosis not present

## 2015-03-24 LAB — POCT INR: INR: 2.8

## 2015-03-29 DIAGNOSIS — Z96653 Presence of artificial knee joint, bilateral: Secondary | ICD-10-CM | POA: Diagnosis not present

## 2015-03-29 DIAGNOSIS — M25452 Effusion, left hip: Secondary | ICD-10-CM | POA: Diagnosis not present

## 2015-03-29 DIAGNOSIS — M25562 Pain in left knee: Secondary | ICD-10-CM | POA: Diagnosis not present

## 2015-03-29 DIAGNOSIS — M25561 Pain in right knee: Secondary | ICD-10-CM | POA: Diagnosis not present

## 2015-03-29 DIAGNOSIS — M25451 Effusion, right hip: Secondary | ICD-10-CM | POA: Diagnosis not present

## 2015-04-08 DIAGNOSIS — M25561 Pain in right knee: Secondary | ICD-10-CM | POA: Diagnosis not present

## 2015-04-08 DIAGNOSIS — Z96641 Presence of right artificial hip joint: Secondary | ICD-10-CM | POA: Diagnosis not present

## 2015-04-08 DIAGNOSIS — Z96651 Presence of right artificial knee joint: Secondary | ICD-10-CM | POA: Diagnosis not present

## 2015-04-08 DIAGNOSIS — Z471 Aftercare following joint replacement surgery: Secondary | ICD-10-CM | POA: Diagnosis not present

## 2015-04-08 DIAGNOSIS — M25551 Pain in right hip: Secondary | ICD-10-CM | POA: Diagnosis not present

## 2015-04-11 ENCOUNTER — Other Ambulatory Visit (HOSPITAL_COMMUNITY): Payer: Self-pay | Admitting: Orthopedic Surgery

## 2015-04-11 DIAGNOSIS — Z96651 Presence of right artificial knee joint: Secondary | ICD-10-CM

## 2015-04-19 ENCOUNTER — Encounter (HOSPITAL_COMMUNITY)
Admission: RE | Admit: 2015-04-19 | Discharge: 2015-04-19 | Disposition: A | Discharge status: Home / Self Care | Payer: Medicare Other | Source: Ambulatory Visit | Attending: Orthopedic Surgery | Admitting: Orthopedic Surgery

## 2015-04-19 ENCOUNTER — Ambulatory Visit (HOSPITAL_COMMUNITY)
Admission: RE | Admit: 2015-04-19 | Discharge: 2015-04-19 | Disposition: A | Discharge status: Home / Self Care | Payer: Medicare Other | Source: Ambulatory Visit | Attending: Orthopedic Surgery | Admitting: Orthopedic Surgery

## 2015-04-19 DIAGNOSIS — M25561 Pain in right knee: Secondary | ICD-10-CM | POA: Insufficient documentation

## 2015-04-19 DIAGNOSIS — Z96643 Presence of artificial hip joint, bilateral: Secondary | ICD-10-CM | POA: Insufficient documentation

## 2015-04-19 DIAGNOSIS — Z96653 Presence of artificial knee joint, bilateral: Secondary | ICD-10-CM | POA: Diagnosis not present

## 2015-04-19 DIAGNOSIS — Z96651 Presence of right artificial knee joint: Secondary | ICD-10-CM

## 2015-04-19 MED ORDER — TECHNETIUM TC 99M MEDRONATE IV KIT
25.0000 | PACK | Freq: Once | INTRAVENOUS | Status: AC | PRN
  Administered 2015-04-19: 25 via INTRAVENOUS

## 2015-04-20 ENCOUNTER — Ambulatory Visit (INDEPENDENT_AMBULATORY_CARE_PROVIDER_SITE_OTHER): Payer: Medicare Other | Admitting: *Deleted

## 2015-04-20 DIAGNOSIS — Z5181 Encounter for therapeutic drug level monitoring: Secondary | ICD-10-CM | POA: Diagnosis not present

## 2015-04-20 DIAGNOSIS — I6789 Other cerebrovascular disease: Secondary | ICD-10-CM

## 2015-04-20 LAB — POCT INR: INR: 2.5

## 2015-05-05 DIAGNOSIS — Z471 Aftercare following joint replacement surgery: Secondary | ICD-10-CM | POA: Diagnosis not present

## 2015-05-05 DIAGNOSIS — Z96641 Presence of right artificial hip joint: Secondary | ICD-10-CM | POA: Diagnosis not present

## 2015-05-05 DIAGNOSIS — Z96651 Presence of right artificial knee joint: Secondary | ICD-10-CM | POA: Diagnosis not present

## 2015-05-05 DIAGNOSIS — M25561 Pain in right knee: Secondary | ICD-10-CM | POA: Diagnosis not present

## 2015-05-11 DIAGNOSIS — M810 Age-related osteoporosis without current pathological fracture: Secondary | ICD-10-CM | POA: Diagnosis not present

## 2015-05-11 DIAGNOSIS — I2510 Atherosclerotic heart disease of native coronary artery without angina pectoris: Secondary | ICD-10-CM | POA: Diagnosis not present

## 2015-05-11 DIAGNOSIS — M25551 Pain in right hip: Secondary | ICD-10-CM | POA: Diagnosis not present

## 2015-05-11 DIAGNOSIS — F329 Major depressive disorder, single episode, unspecified: Secondary | ICD-10-CM | POA: Diagnosis not present

## 2015-05-11 DIAGNOSIS — I10 Essential (primary) hypertension: Secondary | ICD-10-CM | POA: Diagnosis not present

## 2015-05-11 DIAGNOSIS — M1990 Unspecified osteoarthritis, unspecified site: Secondary | ICD-10-CM | POA: Diagnosis not present

## 2015-05-16 DIAGNOSIS — M1990 Unspecified osteoarthritis, unspecified site: Secondary | ICD-10-CM | POA: Diagnosis not present

## 2015-05-16 DIAGNOSIS — M810 Age-related osteoporosis without current pathological fracture: Secondary | ICD-10-CM | POA: Diagnosis not present

## 2015-05-16 DIAGNOSIS — F329 Major depressive disorder, single episode, unspecified: Secondary | ICD-10-CM | POA: Diagnosis not present

## 2015-05-16 DIAGNOSIS — M25551 Pain in right hip: Secondary | ICD-10-CM | POA: Diagnosis not present

## 2015-05-16 DIAGNOSIS — I10 Essential (primary) hypertension: Secondary | ICD-10-CM | POA: Diagnosis not present

## 2015-05-16 DIAGNOSIS — I2510 Atherosclerotic heart disease of native coronary artery without angina pectoris: Secondary | ICD-10-CM | POA: Diagnosis not present

## 2015-05-18 DIAGNOSIS — I2510 Atherosclerotic heart disease of native coronary artery without angina pectoris: Secondary | ICD-10-CM | POA: Diagnosis not present

## 2015-05-18 DIAGNOSIS — F329 Major depressive disorder, single episode, unspecified: Secondary | ICD-10-CM | POA: Diagnosis not present

## 2015-05-18 DIAGNOSIS — M1990 Unspecified osteoarthritis, unspecified site: Secondary | ICD-10-CM | POA: Diagnosis not present

## 2015-05-18 DIAGNOSIS — I10 Essential (primary) hypertension: Secondary | ICD-10-CM | POA: Diagnosis not present

## 2015-05-18 DIAGNOSIS — M810 Age-related osteoporosis without current pathological fracture: Secondary | ICD-10-CM | POA: Diagnosis not present

## 2015-05-18 DIAGNOSIS — M25551 Pain in right hip: Secondary | ICD-10-CM | POA: Diagnosis not present

## 2015-05-25 DIAGNOSIS — F329 Major depressive disorder, single episode, unspecified: Secondary | ICD-10-CM | POA: Diagnosis not present

## 2015-05-25 DIAGNOSIS — M1990 Unspecified osteoarthritis, unspecified site: Secondary | ICD-10-CM | POA: Diagnosis not present

## 2015-05-25 DIAGNOSIS — M810 Age-related osteoporosis without current pathological fracture: Secondary | ICD-10-CM | POA: Diagnosis not present

## 2015-05-25 DIAGNOSIS — M25551 Pain in right hip: Secondary | ICD-10-CM | POA: Diagnosis not present

## 2015-05-25 DIAGNOSIS — I10 Essential (primary) hypertension: Secondary | ICD-10-CM | POA: Diagnosis not present

## 2015-05-25 DIAGNOSIS — I2510 Atherosclerotic heart disease of native coronary artery without angina pectoris: Secondary | ICD-10-CM | POA: Diagnosis not present

## 2015-05-27 DIAGNOSIS — M810 Age-related osteoporosis without current pathological fracture: Secondary | ICD-10-CM | POA: Diagnosis not present

## 2015-05-27 DIAGNOSIS — F329 Major depressive disorder, single episode, unspecified: Secondary | ICD-10-CM | POA: Diagnosis not present

## 2015-05-27 DIAGNOSIS — M25551 Pain in right hip: Secondary | ICD-10-CM | POA: Diagnosis not present

## 2015-05-27 DIAGNOSIS — M1990 Unspecified osteoarthritis, unspecified site: Secondary | ICD-10-CM | POA: Diagnosis not present

## 2015-05-27 DIAGNOSIS — I10 Essential (primary) hypertension: Secondary | ICD-10-CM | POA: Diagnosis not present

## 2015-05-27 DIAGNOSIS — I2510 Atherosclerotic heart disease of native coronary artery without angina pectoris: Secondary | ICD-10-CM | POA: Diagnosis not present

## 2015-05-31 ENCOUNTER — Ambulatory Visit (INDEPENDENT_AMBULATORY_CARE_PROVIDER_SITE_OTHER): Payer: Medicare Other | Admitting: *Deleted

## 2015-05-31 DIAGNOSIS — Z5181 Encounter for therapeutic drug level monitoring: Secondary | ICD-10-CM | POA: Diagnosis not present

## 2015-05-31 DIAGNOSIS — I6789 Other cerebrovascular disease: Secondary | ICD-10-CM

## 2015-05-31 LAB — POCT INR: INR: 2.1

## 2015-06-01 DIAGNOSIS — F329 Major depressive disorder, single episode, unspecified: Secondary | ICD-10-CM | POA: Diagnosis not present

## 2015-06-01 DIAGNOSIS — M810 Age-related osteoporosis without current pathological fracture: Secondary | ICD-10-CM | POA: Diagnosis not present

## 2015-06-01 DIAGNOSIS — M25551 Pain in right hip: Secondary | ICD-10-CM | POA: Diagnosis not present

## 2015-06-01 DIAGNOSIS — M1990 Unspecified osteoarthritis, unspecified site: Secondary | ICD-10-CM | POA: Diagnosis not present

## 2015-06-01 DIAGNOSIS — I2510 Atherosclerotic heart disease of native coronary artery without angina pectoris: Secondary | ICD-10-CM | POA: Diagnosis not present

## 2015-06-01 DIAGNOSIS — I10 Essential (primary) hypertension: Secondary | ICD-10-CM | POA: Diagnosis not present

## 2015-06-06 DIAGNOSIS — I2510 Atherosclerotic heart disease of native coronary artery without angina pectoris: Secondary | ICD-10-CM | POA: Diagnosis not present

## 2015-06-06 DIAGNOSIS — M25551 Pain in right hip: Secondary | ICD-10-CM | POA: Diagnosis not present

## 2015-06-06 DIAGNOSIS — F329 Major depressive disorder, single episode, unspecified: Secondary | ICD-10-CM | POA: Diagnosis not present

## 2015-06-06 DIAGNOSIS — I10 Essential (primary) hypertension: Secondary | ICD-10-CM | POA: Diagnosis not present

## 2015-06-06 DIAGNOSIS — M810 Age-related osteoporosis without current pathological fracture: Secondary | ICD-10-CM | POA: Diagnosis not present

## 2015-06-06 DIAGNOSIS — M1990 Unspecified osteoarthritis, unspecified site: Secondary | ICD-10-CM | POA: Diagnosis not present

## 2015-06-07 DIAGNOSIS — R300 Dysuria: Secondary | ICD-10-CM | POA: Diagnosis not present

## 2015-06-07 DIAGNOSIS — E7800 Pure hypercholesterolemia, unspecified: Secondary | ICD-10-CM | POA: Diagnosis not present

## 2015-06-07 DIAGNOSIS — N760 Acute vaginitis: Secondary | ICD-10-CM | POA: Diagnosis not present

## 2015-06-10 DIAGNOSIS — I10 Essential (primary) hypertension: Secondary | ICD-10-CM | POA: Diagnosis not present

## 2015-06-10 DIAGNOSIS — M810 Age-related osteoporosis without current pathological fracture: Secondary | ICD-10-CM | POA: Diagnosis not present

## 2015-06-10 DIAGNOSIS — M25551 Pain in right hip: Secondary | ICD-10-CM | POA: Diagnosis not present

## 2015-06-10 DIAGNOSIS — F329 Major depressive disorder, single episode, unspecified: Secondary | ICD-10-CM | POA: Diagnosis not present

## 2015-06-10 DIAGNOSIS — M1990 Unspecified osteoarthritis, unspecified site: Secondary | ICD-10-CM | POA: Diagnosis not present

## 2015-06-10 DIAGNOSIS — I2510 Atherosclerotic heart disease of native coronary artery without angina pectoris: Secondary | ICD-10-CM | POA: Diagnosis not present

## 2015-07-13 ENCOUNTER — Ambulatory Visit (INDEPENDENT_AMBULATORY_CARE_PROVIDER_SITE_OTHER): Payer: Medicare Other | Admitting: Pharmacist

## 2015-07-13 DIAGNOSIS — Z5181 Encounter for therapeutic drug level monitoring: Secondary | ICD-10-CM

## 2015-07-13 DIAGNOSIS — I6789 Other cerebrovascular disease: Secondary | ICD-10-CM

## 2015-07-13 LAB — POCT INR: INR: 2.4

## 2015-07-22 DIAGNOSIS — G2581 Restless legs syndrome: Secondary | ICD-10-CM | POA: Diagnosis not present

## 2015-07-22 DIAGNOSIS — F321 Major depressive disorder, single episode, moderate: Secondary | ICD-10-CM | POA: Diagnosis not present

## 2015-07-22 DIAGNOSIS — I10 Essential (primary) hypertension: Secondary | ICD-10-CM | POA: Diagnosis not present

## 2015-07-22 DIAGNOSIS — Z23 Encounter for immunization: Secondary | ICD-10-CM | POA: Diagnosis not present

## 2015-07-22 DIAGNOSIS — E559 Vitamin D deficiency, unspecified: Secondary | ICD-10-CM | POA: Diagnosis not present

## 2015-07-22 DIAGNOSIS — L84 Corns and callosities: Secondary | ICD-10-CM | POA: Diagnosis not present

## 2015-07-22 DIAGNOSIS — E7800 Pure hypercholesterolemia, unspecified: Secondary | ICD-10-CM | POA: Diagnosis not present

## 2015-08-19 DIAGNOSIS — Z96641 Presence of right artificial hip joint: Secondary | ICD-10-CM | POA: Diagnosis not present

## 2015-08-19 DIAGNOSIS — Z96651 Presence of right artificial knee joint: Secondary | ICD-10-CM | POA: Diagnosis not present

## 2015-08-19 DIAGNOSIS — M25551 Pain in right hip: Secondary | ICD-10-CM | POA: Diagnosis not present

## 2015-08-19 DIAGNOSIS — Z471 Aftercare following joint replacement surgery: Secondary | ICD-10-CM | POA: Diagnosis not present

## 2015-08-24 ENCOUNTER — Ambulatory Visit (INDEPENDENT_AMBULATORY_CARE_PROVIDER_SITE_OTHER): Payer: Medicare Other | Admitting: *Deleted

## 2015-08-24 DIAGNOSIS — Z5181 Encounter for therapeutic drug level monitoring: Secondary | ICD-10-CM

## 2015-08-24 DIAGNOSIS — I6789 Other cerebrovascular disease: Secondary | ICD-10-CM

## 2015-08-24 LAB — POCT INR: INR: 3.1

## 2015-09-05 DIAGNOSIS — F321 Major depressive disorder, single episode, moderate: Secondary | ICD-10-CM | POA: Diagnosis not present

## 2015-09-12 ENCOUNTER — Other Ambulatory Visit: Payer: Self-pay | Admitting: Interventional Cardiology

## 2015-09-21 ENCOUNTER — Telehealth: Payer: Self-pay | Admitting: Nurse Practitioner

## 2015-09-21 NOTE — Telephone Encounter (Signed)
Patient called to r/s her 3/16 appt due to being sic. R/s for 4/5 at 145pm

## 2015-09-22 ENCOUNTER — Ambulatory Visit: Payer: Medicare Other | Admitting: Nurse Practitioner

## 2015-09-22 ENCOUNTER — Other Ambulatory Visit: Payer: Medicare Other

## 2015-10-11 ENCOUNTER — Other Ambulatory Visit: Payer: Self-pay

## 2015-10-11 DIAGNOSIS — C50912 Malignant neoplasm of unspecified site of left female breast: Secondary | ICD-10-CM

## 2015-10-12 ENCOUNTER — Ambulatory Visit (HOSPITAL_BASED_OUTPATIENT_CLINIC_OR_DEPARTMENT_OTHER): Payer: Medicare Other | Admitting: Nurse Practitioner

## 2015-10-12 ENCOUNTER — Encounter: Payer: Self-pay | Admitting: Nurse Practitioner

## 2015-10-12 ENCOUNTER — Other Ambulatory Visit (HOSPITAL_BASED_OUTPATIENT_CLINIC_OR_DEPARTMENT_OTHER): Payer: Medicare Other

## 2015-10-12 ENCOUNTER — Telehealth: Payer: Self-pay | Admitting: Nurse Practitioner

## 2015-10-12 VITALS — BP 146/88 | HR 82 | Temp 98.1°F | Resp 16 | Ht 64.0 in | Wt 144.2 lb

## 2015-10-12 DIAGNOSIS — Z853 Personal history of malignant neoplasm of breast: Secondary | ICD-10-CM | POA: Diagnosis not present

## 2015-10-12 DIAGNOSIS — C50912 Malignant neoplasm of unspecified site of left female breast: Secondary | ICD-10-CM

## 2015-10-12 LAB — COMPREHENSIVE METABOLIC PANEL
ALT: 10 U/L (ref 0–55)
AST: 19 U/L (ref 5–34)
Albumin: 4 g/dL (ref 3.5–5.0)
Alkaline Phosphatase: 77 U/L (ref 40–150)
Anion Gap: 7 mEq/L (ref 3–11)
BUN: 24.9 mg/dL (ref 7.0–26.0)
CALCIUM: 9.6 mg/dL (ref 8.4–10.4)
CHLORIDE: 109 meq/L (ref 98–109)
CO2: 26 meq/L (ref 22–29)
CREATININE: 0.9 mg/dL (ref 0.6–1.1)
EGFR: 60 mL/min/{1.73_m2} — ABNORMAL LOW (ref >90)
Glucose: 84 mg/dl (ref 70–140)
Potassium: 4.2 mEq/L (ref 3.5–5.1)
Sodium: 142 mEq/L (ref 136–145)
TOTAL PROTEIN: 7.1 g/dL (ref 6.4–8.3)
Total Bilirubin: 0.47 mg/dL (ref 0.20–1.20)

## 2015-10-12 LAB — CBC WITH DIFFERENTIAL/PLATELET
BASO%: 2.8 % — AB (ref 0.0–2.0)
Basophils Absolute: 0.2 10*3/uL — ABNORMAL HIGH (ref 0.0–0.1)
EOS%: 3.3 % (ref 0.0–7.0)
Eosinophils Absolute: 0.2 10*3/uL (ref 0.0–0.5)
HEMATOCRIT: 42.4 % (ref 34.8–46.6)
HGB: 14.2 g/dL (ref 11.6–15.9)
LYMPH#: 1.9 10*3/uL (ref 0.9–3.3)
LYMPH%: 30.2 % (ref 14.0–49.7)
MCH: 29.4 pg (ref 25.1–34.0)
MCHC: 33.5 g/dL (ref 31.5–36.0)
MCV: 87.8 fL (ref 79.5–101.0)
MONO#: 0.5 10*3/uL (ref 0.1–0.9)
MONO%: 8.8 % (ref 0.0–14.0)
NEUT%: 54.9 % (ref 38.4–76.8)
NEUTROS ABS: 3.4 10*3/uL (ref 1.5–6.5)
Platelets: 209 10*3/uL (ref 145–400)
RBC: 4.83 10*6/uL (ref 3.70–5.45)
RDW: 14.9 % — AB (ref 11.2–14.5)
WBC: 6.2 10*3/uL (ref 3.9–10.3)

## 2015-10-12 NOTE — Progress Notes (Signed)
Hematology and Oncology Follow Up Visit  Renee Adams [id number]   [id number]  October 05, 1933 82 y.o. 10/12/2015   PCP:  Donnie Coffin, MD  CHIEF COMPLAINT: Remote Hx of Left Breast Cancer  CURRENT TREATMENT: observation  BREAST CANCER HISTORY: Renee Adams was originally diagnosed with a left breast carcinoma in early 1996. Tumor was T1, N0, ER and PR positive. She underwent left lumpectomy and sentinel lymph node dissection in March of 1996.  Patient was treated with tamoxifen for 5 years, then Evista for 3 years. The Evsita was then discontinued secondary to history of stroke.   Currently being followed with observation alone.  INTERIM HISTORY: Renee Adams returns alone today for routine follow up of her remote left breast carcinoma. The interval history is remarkable for 2 falls in the past 6 months. She is becoming more unstable on her feet. She now ambulates with a walker instead of a cane. Her driving privileges were revoked due to poor vision. Her son lives close and she has friends who take turns getting her around. She lives alone and manages her ADLs by herself.  REVIEW OF SYSTEMS: Renee Adams denies fevers, chills, nausea, vomiting, or changes in bowel or bladder habits. He appetite is healthy. She has some shortness of breath with exertion, but denies chest pain, cough, or palpitations. She has a history of osteoarthritis and is on voltaren for the pain to her hands, hips. She has no headaches but does have occasional dizziness. She is on coumadin, and denies abnormal bleeding or bruising. She endorses some depression but denies anxiety or suicidal ideation. A detailed review of systems is otherwise stable.  Medications:   Current Outpatient Prescriptions  Medication Sig Dispense Refill  . acetaminophen (TYLENOL) 500 MG tablet Take 500 mg by mouth every 6 (six) hours as needed.    . ALPRAZolam (XANAX) 0.25 MG tablet Take 4 tablets (1 mg total) by mouth every 4 (four) hours as needed for  anxiety. 40 tablet 0  . amLODipine (NORVASC) 5 MG tablet Take 1 tablet (5 mg total) by mouth daily. 30 tablet 5  . atorvastatin (LIPITOR) 10 MG tablet Take 1 tablet (10 mg total) by mouth daily. 30 tablet 4  . buPROPion (WELLBUTRIN XL) 300 MG 24 hr tablet Take 1 tablet (300 mg total) by mouth daily. 30 tablet 1  . calcium carbonate 200 MG capsule Take 3 capsules (600 mg total) by mouth 2 (two) times daily with a meal. 60 capsule 1  . Diclofenac Sodium (VOLTAREN PO) Take 1 tablet by mouth as needed.    . ergocalciferol (VITAMIN D2) 50000 UNITS capsule Take 50,000 Units by mouth once a week.    . latanoprost (XALATAN) 0.005 % ophthalmic solution Place 1 drop into both eyes at bedtime. 2.5 mL 12  . rOPINIRole (REQUIP) 1 MG tablet Take 1 tablet (1 mg total) by mouth at bedtime as needed. For restless legs 30 tablet 0  . sertraline (ZOLOFT) 100 MG tablet Take 1.5 tablets (150 mg total) by mouth daily. 30 tablet 1  . warfarin (COUMADIN) 2 MG tablet TAKE 1 TABLET (2 MG TOTAL) BY MOUTH AS DIRECTED. (Patient taking differently: Pt takes 3 mg on Mon, Wed, Fri, all other days 2mg ) 45 tablet 3  . [DISCONTINUED] pramipexole (MIRAPEX) 0.125 MG tablet Take 0.125 mg by mouth 3 (three) times daily.     No current facility-administered medications for this visit.    Allergies:  Allergies  Allergen Reactions  . Dilaudid [Hydromorphone Hcl] Shortness Of Breath  Tolerates tramadol  . Morphine And Related Shortness Of Breath    Tolerates tramadol  . Sulfa Antibiotics Other (See Comments)    "Made me drunk";  wobbly  . Keflex [Cephalexin] Nausea Only  . Levaquin [Levofloxacin] Hives and Rash    Physical Exam: Elderly white woman in no acute distress, ambulating with the assistance of a cane Filed Vitals:   10/12/15 1340  BP: 146/88  Pulse: 82  Temp: 98.1 F (36.7 C)  Resp: 16    Body mass index is 24.74 kg/(m^2).  ECOG:  1 Filed Weights   10/12/15 1340  Weight: 144 lb 3.2 oz (65.409 kg)    Physical Exam: Skin: warm, dry  HEENT: sclerae anicteric, conjunctivae pink, oropharynx clear. No thrush or mucositis.  Lymph Nodes: No cervical or supraclavicular lymphadenopathy  Lungs: clear to auscultation bilaterally, no rales, wheezes, or rhonci  Heart: regular rate and rhythm  Abdomen: round, soft, non tender, positive bowel sounds  Musculoskeletal: No focal spinal tenderness, no peripheral edema, moderate kyphosis Neuro: non focal, well oriented, positive affect  Breast: right breast unremarkable. Left breast status post lumpectomy. No evidence of recurrent disease. Bilateral axillae benign.  Lab Results: Lab Results  Component Value Date   WBC 6.2 10/12/2015   HGB 14.2 10/12/2015   HCT 42.4 10/12/2015   MCV 87.8 10/12/2015   PLT 209 10/12/2015   NEUTROABS 3.4 10/12/2015     Chemistry      Component Value Date/Time   NA 142 10/12/2015 1317   NA 142 12/05/2012 1557   K 4.2 10/12/2015 1317   K 3.7 12/05/2012 1557   CL 107 12/05/2012 1557   CL 109* 09/10/2012 1315   CO2 26 10/12/2015 1317   CO2 26 12/05/2012 1557   BUN 24.9 10/12/2015 1317   BUN 16 12/05/2012 1557   CREATININE 0.9 10/12/2015 1317   CREATININE 0.80 12/05/2012 1557      Component Value Date/Time   CALCIUM 9.6 10/12/2015 1317   CALCIUM 9.2 12/05/2012 1557   ALKPHOS 77 10/12/2015 1317   ALKPHOS 78 01/11/2014 1022   AST 19 10/12/2015 1317   AST 19 01/11/2014 1022   ALT 10 10/12/2015 1317   ALT 9 01/11/2014 1022   BILITOT 0.47 10/12/2015 1317   BILITOT 0.4 01/11/2014 1022      STUDIES: No results found.  Assessment:  82 y.o. [place] woman   (1)  status post left lumpectomy and sentinel lymph node dissection March 1996 for a T1, N0, ER/PR positive breast carcinoma.    (2)  Treated with tamoxifen for five years, then Evista for three years with the Evista discontinued secondary to stroke.    (3)  Currently being followed with observation alone.   Plan:  Renee Adams is doing well as far  as her breast cancer is concerned. She is now 17(!) years out from her definitive surgery with no evidence of recurrent disease. She is due for her annual mammogram this month, so I have placed the orders for this to be performed at the [facility].   Renee Adams could easily graduate from repeat follow up here, but it is her preference that she returns yearly. She like alternating visits with Dr. Jana Hakim. She will return next April according. She understands and agrees with this. She has been encouraged to call with any issues that might arise before her next visit here.   Laurie Panda, NP 10/12/2015

## 2015-10-12 NOTE — Telephone Encounter (Signed)
appt made and avs printed °

## 2015-10-24 ENCOUNTER — Ambulatory Visit (INDEPENDENT_AMBULATORY_CARE_PROVIDER_SITE_OTHER): Payer: Medicare Other | Admitting: Pharmacist

## 2015-10-24 DIAGNOSIS — Z5181 Encounter for therapeutic drug level monitoring: Secondary | ICD-10-CM

## 2015-10-24 DIAGNOSIS — I6789 Other cerebrovascular disease: Secondary | ICD-10-CM

## 2015-10-24 LAB — POCT INR: INR: 2.5

## 2015-11-11 ENCOUNTER — Ambulatory Visit
Admission: RE | Admit: 2015-11-11 | Discharge: 2015-11-11 | Disposition: A | Discharge status: Home / Self Care | Payer: Medicare Other | Source: Ambulatory Visit | Attending: Nurse Practitioner | Admitting: Nurse Practitioner

## 2015-11-11 DIAGNOSIS — C50912 Malignant neoplasm of unspecified site of left female breast: Secondary | ICD-10-CM

## 2015-11-11 DIAGNOSIS — R928 Other abnormal and inconclusive findings on diagnostic imaging of breast: Secondary | ICD-10-CM | POA: Diagnosis not present

## 2015-11-30 ENCOUNTER — Ambulatory Visit (INDEPENDENT_AMBULATORY_CARE_PROVIDER_SITE_OTHER): Payer: Medicare Other | Admitting: *Deleted

## 2015-11-30 DIAGNOSIS — Z5181 Encounter for therapeutic drug level monitoring: Secondary | ICD-10-CM

## 2015-11-30 DIAGNOSIS — I6789 Other cerebrovascular disease: Secondary | ICD-10-CM | POA: Diagnosis not present

## 2015-11-30 LAB — POCT INR: INR: 1.4

## 2015-12-07 ENCOUNTER — Ambulatory Visit (INDEPENDENT_AMBULATORY_CARE_PROVIDER_SITE_OTHER): Payer: Medicare Other | Admitting: *Deleted

## 2015-12-07 DIAGNOSIS — I6789 Other cerebrovascular disease: Secondary | ICD-10-CM | POA: Diagnosis not present

## 2015-12-07 DIAGNOSIS — Z5181 Encounter for therapeutic drug level monitoring: Secondary | ICD-10-CM

## 2015-12-07 LAB — POCT INR: INR: 3.3

## 2015-12-21 ENCOUNTER — Ambulatory Visit (INDEPENDENT_AMBULATORY_CARE_PROVIDER_SITE_OTHER): Payer: Medicare Other | Admitting: *Deleted

## 2015-12-21 DIAGNOSIS — I6789 Other cerebrovascular disease: Secondary | ICD-10-CM

## 2015-12-21 DIAGNOSIS — Z5181 Encounter for therapeutic drug level monitoring: Secondary | ICD-10-CM

## 2015-12-21 LAB — POCT INR: INR: 2.3

## 2016-01-11 ENCOUNTER — Ambulatory Visit (INDEPENDENT_AMBULATORY_CARE_PROVIDER_SITE_OTHER): Payer: Medicare Other | Admitting: Pharmacist

## 2016-01-11 DIAGNOSIS — I6789 Other cerebrovascular disease: Secondary | ICD-10-CM

## 2016-01-11 DIAGNOSIS — Z5181 Encounter for therapeutic drug level monitoring: Secondary | ICD-10-CM

## 2016-01-11 LAB — POCT INR: INR: 1.8

## 2016-01-24 ENCOUNTER — Ambulatory Visit (INDEPENDENT_AMBULATORY_CARE_PROVIDER_SITE_OTHER): Payer: Medicare Other | Admitting: Interventional Cardiology

## 2016-01-24 ENCOUNTER — Encounter: Payer: Self-pay | Admitting: Interventional Cardiology

## 2016-01-24 VITALS — BP 112/70 | HR 78 | Ht 64.0 in | Wt 144.0 lb

## 2016-01-24 DIAGNOSIS — Q211 Atrial septal defect, unspecified: Secondary | ICD-10-CM

## 2016-01-24 DIAGNOSIS — E782 Mixed hyperlipidemia: Secondary | ICD-10-CM | POA: Diagnosis not present

## 2016-01-24 DIAGNOSIS — Z7901 Long term (current) use of anticoagulants: Secondary | ICD-10-CM | POA: Diagnosis not present

## 2016-01-24 DIAGNOSIS — I10 Essential (primary) hypertension: Secondary | ICD-10-CM

## 2016-01-24 MED ORDER — AMLODIPINE BESYLATE 5 MG PO TABS: 5.0000 mg | ORAL_TABLET | Freq: Every day | ORAL | Status: DC

## 2016-01-24 MED ORDER — ATORVASTATIN CALCIUM 10 MG PO TABS: 10.0000 mg | ORAL_TABLET | Freq: Every day | ORAL | Status: DC

## 2016-01-24 NOTE — Patient Instructions (Signed)
Medication Instructions:  Same-no changes  Labwork: None  Testing/Procedures: None  Follow-Up: 1 year     If you need a refill on your cardiac medications before your next appointment, please call your pharmacy.

## 2016-01-24 NOTE — Progress Notes (Signed)
Patient ID: Renee Adams, female   DOB: [birth year], 82 y.o.   MRN: [id number]     Cardiology Office Note   Date:  01/24/2016   ID:  ASHLEY HOBER, DOB [birth year], MRN [id number]  PCP:  Donnie Coffin, MD    No chief complaint on file. f/u ASD   Wt Readings from Last 3 Encounters:  01/24/16 144 lb (65.318 kg)  10/12/15 144 lb 3.2 oz (65.409 kg)  01/05/15 143 lb (64.864 kg)       History of Present Illness: Renee Adams is a 82 y.o. female  Who has osteoarthritis.  She has had both hips and knees replaced.  She now has arthritis in her hands.    She has had prior CVA and ASD was detected at that time in 2004.  She was started on Coumadin in 2004.  SHe has not had AFib recently.  The pounding feeling in her chest resolved.  Coumadin for stroke prevention has been somewhat difficult to regulate.  No bleeding problems.  Palpitations have not been an issue.  She can only walk short distances so exercise is limited.  Her foot is everted after surgery.  THis continues to be an issue.  She has to walk slowly.  Her knee and hip feel unstable.  She uses a cane.  SHe is trying to avoid surgery.    Past Medical History  Diagnosis Date  . GERD (gastroesophageal reflux disease)   . Glaucoma   . Hypertension   . Depression   . Glaucoma   . Gait disorder   . Cancer [facility])     breast, left  . IBS (irritable bowel syndrome)   . History of stroke   . ASD (atrial septal defect)   . S/P bunionectomy     bilateral  . Carpal tunnel syndrome, bilateral     Past Surgical History  Procedure Laterality Date  . Total knee arthroplasty Bilateral   . Total hip arthroplasty Bilateral   . Mastectomy  1996    left breast lumpectomy   . Incision and drainage hip  03/31/2012    Procedure: IRRIGATION AND DEBRIDEMENT HIP WITH POLY EXCHANGE;  Surgeon: Rudean Haskell, MD;  Location: [facility];  Service: Orthopedics;  Laterality: Left;  . Orif femur fracture Right 11/30/2012    Procedure: OPEN  REDUCTION INTERNAL FIXATION (ORIF) DISTAL FEMUR FRACTURE;  Surgeon: Vickey Huger, MD;  Location: [facility];  Service: Orthopedics;  Laterality: Right;  . Cataract extraction Bilateral   . Bladder resuspension    . Abdominal hysterectomy       Current Outpatient Prescriptions  Medication Sig Dispense Refill  . acetaminophen (TYLENOL) 500 MG tablet Take 500 mg by mouth every 6 (six) hours as needed for mild pain.     Marland Kitchen ALPRAZolam (XANAX) 0.25 MG tablet Take 4 tablets (1 mg total) by mouth every 4 (four) hours as needed for anxiety. 40 tablet 0  . amLODipine (NORVASC) 5 MG tablet Take 1 tablet (5 mg total) by mouth daily. 30 tablet 5  . atorvastatin (LIPITOR) 10 MG tablet Take 1 tablet (10 mg total) by mouth daily. 30 tablet 4  . buPROPion (WELLBUTRIN XL) 300 MG 24 hr tablet Take 1 tablet (300 mg total) by mouth daily. 30 tablet 1  . calcium carbonate 200 MG capsule Take 3 capsules (600 mg total) by mouth 2 (two) times daily with a meal. 60 capsule 1  . Cholecalciferol (VITAMIN D-1000 MAX ST) 1000 units tablet Take by  mouth.    . Diclofenac Sodium (VOLTAREN PO) Take 1 tablet by mouth as directed.     . ergocalciferol (VITAMIN D2) 50000 UNITS capsule Take 50,000 Units by mouth once a week.    . latanoprost (XALATAN) 0.005 % ophthalmic solution Place 1 drop into both eyes at bedtime. 2.5 mL 12  . rOPINIRole (REQUIP) 1 MG tablet Take 1 tablet (1 mg total) by mouth at bedtime as needed. For restless legs 30 tablet 0  . sertraline (ZOLOFT) 100 MG tablet Take 1.5 tablets (150 mg total) by mouth daily. 30 tablet 1  . warfarin (COUMADIN) 2 MG tablet Take 2 mg by mouth as needed.    . [DISCONTINUED] pramipexole (MIRAPEX) 0.125 MG tablet Take 0.125 mg by mouth 3 (three) times daily.     No current facility-administered medications for this visit.    Allergies:   Dilaudid; Hydromorphone; Morphine; Morphine and related; Sulfamethoxazole; Sulfa antibiotics; Keflex; and Levaquin    Social History:  The  patient  reports that she has never smoked. She has never used smokeless tobacco. She reports that she does not drink alcohol or use illicit drugs.   Family History:  The patient's family history includes Cancer in her mother; Diabetes in her sister; Heart failure in her father; Osteoarthritis in her brother. There is no history of Heart attack.    ROS:  Please see the history of present illness.   Otherwise, review of systems are positive for joint pain.   All other systems are reviewed and negative.    PHYSICAL EXAM: VS:  BP 112/70 mmHg  Pulse 78  Ht 5\' 4"  (1.626 m)  Wt 144 lb (65.318 kg)  BMI 24.71 kg/m2 , BMI Body mass index is 24.71 kg/(m^2). GEN: Well nourished, well developed, in no acute distress HEENT: normal Neck: no JVD, carotid bruits, or masses Cardiac: RRR; no murmurs, rubs, or gallops,no edema  Respiratory:  clear to auscultation bilaterally, normal work of breathing GI: soft, nontender, nondistended, + BS MS: no deformity or atrophy, right foot everted due to hip problem Skin: warm and dry, no rash Neuro:  Strength and sensation are intact Psych: euthymic mood, full affect   EKG:   The ekg ordered today demonstrates NSR, no ST segment changes   Recent Labs: 10/12/2015: ALT 10; BUN 24.9; Creatinine 0.9; HGB 14.2; Platelets 209; Potassium 4.2; Sodium 142   Lipid Panel    Component Value Date/Time   CHOL 202* 01/11/2014 1022   TRIG 156.0* 01/11/2014 1022   HDL 49.70 01/11/2014 1022   CHOLHDL 4 01/11/2014 1022   VLDL 31.2 01/11/2014 1022   LDLCALC 121* 01/11/2014 1022     Other studies Reviewed: Additional studies/ records that were reviewed today with results demonstrating: Normal LV function by 2012 echo.   ASSESSMENT AND PLAN:  1. ASD: Coumadin given h/o CVA.  COntinue to follow COumadin in the Coumadin clinic. No bleeding. 2. HTN: BP controlled.  COntinue current meds. 3. Hyperlipidemia: CONtinue atorvastatin.  LFTs reviewed and normal.  Needs fasting  lipid profile. She will ask Dr. Alroy Dust to have this drawn at his office. 4. She no longer drives. Water aerobics are difficult to get to.  Exercise bike bothers her knee.   Current medicines are reviewed at length with the patient today.  The patient concerns regarding her medicines were addressed.  The following changes have been made:  No change  Labs/ tests ordered today include:  Orders Placed This Encounter  Procedures  . EKG 12-Lead  Recommend 150 minutes/week of aerobic exercise Low fat, low carb, high fiber diet recommended  Disposition:   FU in 1 year   Signed, Larae Grooms, MD  01/24/2016 3:17 PM    [facility] [address], [place], [place]  [address] Phone: [phone]; Fax: [phone]

## 2016-02-08 ENCOUNTER — Ambulatory Visit (INDEPENDENT_AMBULATORY_CARE_PROVIDER_SITE_OTHER): Payer: Medicare Other | Admitting: *Deleted

## 2016-02-08 DIAGNOSIS — Z5181 Encounter for therapeutic drug level monitoring: Secondary | ICD-10-CM

## 2016-02-08 DIAGNOSIS — I6789 Other cerebrovascular disease: Secondary | ICD-10-CM | POA: Diagnosis not present

## 2016-02-08 LAB — POCT INR: INR: 3.5

## 2016-02-14 ENCOUNTER — Other Ambulatory Visit (HOSPITAL_COMMUNITY): Payer: Self-pay | Admitting: Orthopedic Surgery

## 2016-02-14 DIAGNOSIS — M25551 Pain in right hip: Secondary | ICD-10-CM

## 2016-02-20 DIAGNOSIS — F321 Major depressive disorder, single episode, moderate: Secondary | ICD-10-CM | POA: Diagnosis not present

## 2016-02-20 DIAGNOSIS — I10 Essential (primary) hypertension: Secondary | ICD-10-CM | POA: Diagnosis not present

## 2016-02-20 DIAGNOSIS — E559 Vitamin D deficiency, unspecified: Secondary | ICD-10-CM | POA: Diagnosis not present

## 2016-02-20 DIAGNOSIS — E7800 Pure hypercholesterolemia, unspecified: Secondary | ICD-10-CM | POA: Diagnosis not present

## 2016-02-20 DIAGNOSIS — M25551 Pain in right hip: Secondary | ICD-10-CM | POA: Diagnosis not present

## 2016-02-20 DIAGNOSIS — G2581 Restless legs syndrome: Secondary | ICD-10-CM | POA: Diagnosis not present

## 2016-02-21 ENCOUNTER — Encounter (HOSPITAL_COMMUNITY)
Admission: RE | Admit: 2016-02-21 | Discharge: 2016-02-21 | Disposition: A | Discharge status: Home / Self Care | Payer: Medicare Other | Source: Ambulatory Visit | Attending: Orthopedic Surgery | Admitting: Orthopedic Surgery

## 2016-02-21 ENCOUNTER — Ambulatory Visit (HOSPITAL_COMMUNITY)
Admission: RE | Admit: 2016-02-21 | Discharge: 2016-02-21 | Disposition: A | Discharge status: Home / Self Care | Payer: Medicare Other | Source: Ambulatory Visit | Attending: Orthopedic Surgery | Admitting: Orthopedic Surgery

## 2016-02-21 DIAGNOSIS — Z96641 Presence of right artificial hip joint: Secondary | ICD-10-CM | POA: Insufficient documentation

## 2016-02-21 DIAGNOSIS — R948 Abnormal results of function studies of other organs and systems: Secondary | ICD-10-CM | POA: Diagnosis not present

## 2016-02-21 DIAGNOSIS — M25551 Pain in right hip: Secondary | ICD-10-CM | POA: Insufficient documentation

## 2016-02-21 MED ORDER — TECHNETIUM TC 99M MEDRONATE IV KIT
25.0000 | PACK | Freq: Once | INTRAVENOUS | Status: AC | PRN
  Administered 2016-02-21: 25 via INTRAVENOUS

## 2016-03-07 ENCOUNTER — Telehealth (HOSPITAL_COMMUNITY): Payer: Self-pay | Admitting: Cardiology

## 2016-03-07 ENCOUNTER — Ambulatory Visit (INDEPENDENT_AMBULATORY_CARE_PROVIDER_SITE_OTHER): Payer: Medicare Other | Admitting: *Deleted

## 2016-03-07 DIAGNOSIS — Z5181 Encounter for therapeutic drug level monitoring: Secondary | ICD-10-CM | POA: Diagnosis not present

## 2016-03-07 DIAGNOSIS — I6789 Other cerebrovascular disease: Secondary | ICD-10-CM | POA: Diagnosis not present

## 2016-03-07 LAB — PROTIME-INR
INR: 4.49
Prothrombin Time: 44 seconds — ABNORMAL HIGH (ref 11.4–15.2)

## 2016-03-07 LAB — POCT INR: INR: 6.2

## 2016-03-07 NOTE — Telephone Encounter (Signed)
Have called pt on her mobile phone as requested and she did not answer but did leave message not to take coumadin today as she was instructed in  Clinic and do not take tomorrow August 31st and this nurse will call in am with further instructions

## 2016-03-07 NOTE — Telephone Encounter (Signed)
Critical labs- INR 4.49 per Gerald Stabs in the [employer]- This is a [place] coumadin patient, will forward information   Unable to reach by telephone x 2 attempts [phone] Will forward message

## 2016-03-09 DIAGNOSIS — Z96641 Presence of right artificial hip joint: Secondary | ICD-10-CM | POA: Diagnosis not present

## 2016-03-09 DIAGNOSIS — M25551 Pain in right hip: Secondary | ICD-10-CM | POA: Diagnosis not present

## 2016-03-09 DIAGNOSIS — Z471 Aftercare following joint replacement surgery: Secondary | ICD-10-CM | POA: Diagnosis not present

## 2016-03-14 ENCOUNTER — Ambulatory Visit (INDEPENDENT_AMBULATORY_CARE_PROVIDER_SITE_OTHER): Payer: Medicare Other | Admitting: *Deleted

## 2016-03-14 DIAGNOSIS — I6789 Other cerebrovascular disease: Secondary | ICD-10-CM

## 2016-03-14 DIAGNOSIS — Z5181 Encounter for therapeutic drug level monitoring: Secondary | ICD-10-CM

## 2016-03-14 LAB — POCT INR: INR: 2.2

## 2016-03-20 ENCOUNTER — Other Ambulatory Visit: Payer: Self-pay | Admitting: Interventional Cardiology

## 2016-03-30 ENCOUNTER — Ambulatory Visit (INDEPENDENT_AMBULATORY_CARE_PROVIDER_SITE_OTHER): Payer: Medicare Other | Admitting: *Deleted

## 2016-03-30 ENCOUNTER — Telehealth: Payer: Self-pay | Admitting: Internal Medicine

## 2016-03-30 DIAGNOSIS — Z5181 Encounter for therapeutic drug level monitoring: Secondary | ICD-10-CM

## 2016-03-30 DIAGNOSIS — I6789 Other cerebrovascular disease: Secondary | ICD-10-CM | POA: Diagnosis not present

## 2016-03-30 LAB — PROTIME-INR
INR: 5 — AB
PROTHROMBIN TIME: 47.8 s — AB (ref 11.4–15.2)

## 2016-03-30 LAB — POCT INR: INR: 6.7

## 2016-03-30 NOTE — Telephone Encounter (Signed)
Will forward to CV[facility]. 

## 2016-03-30 NOTE — Telephone Encounter (Signed)
Telephoned lab received INR of 5.0 from [facility].

## 2016-03-30 NOTE — Telephone Encounter (Signed)
Critical  INR Stat!!   Please call med. Tech Rachia back at  [phone]   Thanks.

## 2016-04-13 ENCOUNTER — Ambulatory Visit (INDEPENDENT_AMBULATORY_CARE_PROVIDER_SITE_OTHER): Payer: Medicare Other

## 2016-04-13 DIAGNOSIS — Z5181 Encounter for therapeutic drug level monitoring: Secondary | ICD-10-CM

## 2016-04-13 DIAGNOSIS — I6789 Other cerebrovascular disease: Secondary | ICD-10-CM | POA: Diagnosis not present

## 2016-04-13 LAB — POCT INR: INR: 1.2

## 2016-04-27 ENCOUNTER — Ambulatory Visit (INDEPENDENT_AMBULATORY_CARE_PROVIDER_SITE_OTHER): Payer: Medicare Other

## 2016-04-27 DIAGNOSIS — I6789 Other cerebrovascular disease: Secondary | ICD-10-CM

## 2016-04-27 DIAGNOSIS — Z5181 Encounter for therapeutic drug level monitoring: Secondary | ICD-10-CM

## 2016-04-27 LAB — POCT INR: INR: 2.8

## 2016-05-18 ENCOUNTER — Ambulatory Visit (INDEPENDENT_AMBULATORY_CARE_PROVIDER_SITE_OTHER): Payer: Medicare Other | Admitting: *Deleted

## 2016-05-18 DIAGNOSIS — I6789 Other cerebrovascular disease: Secondary | ICD-10-CM

## 2016-05-18 DIAGNOSIS — Z5181 Encounter for therapeutic drug level monitoring: Secondary | ICD-10-CM

## 2016-05-18 LAB — POCT INR: INR: 2.3

## 2016-06-13 ENCOUNTER — Ambulatory Visit (INDEPENDENT_AMBULATORY_CARE_PROVIDER_SITE_OTHER): Payer: Medicare Other | Admitting: *Deleted

## 2016-06-13 DIAGNOSIS — I6789 Other cerebrovascular disease: Secondary | ICD-10-CM

## 2016-06-13 DIAGNOSIS — Z5181 Encounter for therapeutic drug level monitoring: Secondary | ICD-10-CM | POA: Diagnosis not present

## 2016-06-13 LAB — POCT INR: INR: 2

## 2016-07-24 ENCOUNTER — Ambulatory Visit (INDEPENDENT_AMBULATORY_CARE_PROVIDER_SITE_OTHER): Payer: Medicare Other | Admitting: *Deleted

## 2016-07-24 DIAGNOSIS — Z5181 Encounter for therapeutic drug level monitoring: Secondary | ICD-10-CM | POA: Diagnosis not present

## 2016-07-24 DIAGNOSIS — I6789 Other cerebrovascular disease: Secondary | ICD-10-CM

## 2016-07-24 LAB — POCT INR: INR: 1.9

## 2016-08-14 ENCOUNTER — Ambulatory Visit (INDEPENDENT_AMBULATORY_CARE_PROVIDER_SITE_OTHER): Payer: Medicare Other

## 2016-08-14 DIAGNOSIS — I6789 Other cerebrovascular disease: Secondary | ICD-10-CM

## 2016-08-14 DIAGNOSIS — Z5181 Encounter for therapeutic drug level monitoring: Secondary | ICD-10-CM | POA: Diagnosis not present

## 2016-08-14 LAB — POCT INR: INR: 2

## 2016-08-22 DIAGNOSIS — I10 Essential (primary) hypertension: Secondary | ICD-10-CM | POA: Diagnosis not present

## 2016-08-22 DIAGNOSIS — F321 Major depressive disorder, single episode, moderate: Secondary | ICD-10-CM | POA: Diagnosis not present

## 2016-08-22 DIAGNOSIS — E7800 Pure hypercholesterolemia, unspecified: Secondary | ICD-10-CM | POA: Diagnosis not present

## 2016-08-22 DIAGNOSIS — M25551 Pain in right hip: Secondary | ICD-10-CM | POA: Diagnosis not present

## 2016-08-22 DIAGNOSIS — E559 Vitamin D deficiency, unspecified: Secondary | ICD-10-CM | POA: Diagnosis not present

## 2016-08-22 DIAGNOSIS — G2581 Restless legs syndrome: Secondary | ICD-10-CM | POA: Diagnosis not present

## 2016-08-22 DIAGNOSIS — R5383 Other fatigue: Secondary | ICD-10-CM | POA: Diagnosis not present

## 2016-08-22 DIAGNOSIS — Z23 Encounter for immunization: Secondary | ICD-10-CM | POA: Diagnosis not present

## 2016-09-02 ENCOUNTER — Emergency Department (HOSPITAL_COMMUNITY)
Admission: EM | Admit: 2016-09-02 | Discharge: 2016-09-02 | Disposition: A | Discharge status: Home / Self Care | Payer: Medicare Other | Attending: Emergency Medicine | Admitting: Emergency Medicine

## 2016-09-02 ENCOUNTER — Encounter (HOSPITAL_COMMUNITY): Payer: Self-pay

## 2016-09-02 DIAGNOSIS — Z853 Personal history of malignant neoplasm of breast: Secondary | ICD-10-CM | POA: Insufficient documentation

## 2016-09-02 DIAGNOSIS — Z8673 Personal history of transient ischemic attack (TIA), and cerebral infarction without residual deficits: Secondary | ICD-10-CM | POA: Diagnosis not present

## 2016-09-02 DIAGNOSIS — I10 Essential (primary) hypertension: Secondary | ICD-10-CM | POA: Insufficient documentation

## 2016-09-02 DIAGNOSIS — Z96653 Presence of artificial knee joint, bilateral: Secondary | ICD-10-CM | POA: Diagnosis not present

## 2016-09-02 DIAGNOSIS — R0602 Shortness of breath: Secondary | ICD-10-CM | POA: Diagnosis not present

## 2016-09-02 DIAGNOSIS — T7840XA Allergy, unspecified, initial encounter: Secondary | ICD-10-CM | POA: Diagnosis present

## 2016-09-02 DIAGNOSIS — Z96643 Presence of artificial hip joint, bilateral: Secondary | ICD-10-CM | POA: Insufficient documentation

## 2016-09-02 DIAGNOSIS — T782XXA Anaphylactic shock, unspecified, initial encounter: Secondary | ICD-10-CM | POA: Diagnosis not present

## 2016-09-02 DIAGNOSIS — R0902 Hypoxemia: Secondary | ICD-10-CM | POA: Diagnosis not present

## 2016-09-02 DIAGNOSIS — Z7901 Long term (current) use of anticoagulants: Secondary | ICD-10-CM | POA: Insufficient documentation

## 2016-09-02 DIAGNOSIS — R062 Wheezing: Secondary | ICD-10-CM | POA: Diagnosis not present

## 2016-09-02 MED ORDER — EPINEPHRINE 0.3 MG/0.3ML IJ SOAJ: 0.3000 mg | Freq: Once | INTRAMUSCULAR | 0 refills | Status: AC

## 2016-09-02 MED ORDER — SODIUM CHLORIDE 0.9 % IV BOLUS (SEPSIS)
1000.0000 mL | Freq: Once | INTRAVENOUS | Status: AC
  Administered 2016-09-02: 1000 mL via INTRAVENOUS

## 2016-09-02 MED ORDER — ACETAMINOPHEN 500 MG PO TABS
1000.0000 mg | ORAL_TABLET | Freq: Once | ORAL | Status: AC
  Administered 2016-09-02: 1000 mg via ORAL
  Filled 2016-09-02: qty 2

## 2016-09-02 MED ORDER — PREDNISONE 10 MG (21) PO TBPK: ORAL_TABLET | ORAL | 0 refills | Status: DC

## 2016-09-02 NOTE — ED Triage Notes (Signed)
Pt. Coming from home via [facility] for allergic rxn. Pt. Ate mexican last night and went to bed at 3am feeling fine. Pt. Awoke today at 1pm with itching and called EMS. Upon EMS arrival to the house pt. Red, hives, itching, wheezing, hypotensive, and tachycardic. Pt. Given epipen, 50mg  benadryl, 5mg  albuterol, and 4mg  zofran. Pt. Also c/o diarrhea and vomiting prior to EMS arrival. Pt. BP and HR back to baseline. EDP at bedside.

## 2016-09-02 NOTE — ED Notes (Signed)
Pt verbalized understanding discharge instructions and denies any further needs or questions at this time. VS stable, ambulatory and steady gait.   

## 2016-09-02 NOTE — ED Provider Notes (Signed)
[place] DEPT Provider Note   CSN: [id number] Arrival date & time: 09/02/16  1509     History   Chief Complaint Chief Complaint  Patient presents with  . Allergic Reaction    HPI Renee Adams is a 83 y.o. female.  Pt presents to the ED today with allergic sx.  She ate at a [employer] last night.  She went to bed around 0300 feeling fine.  She awoke today at 1300 with itching.  She called EMS pta and they found her to have hives with wheezing and hypotension.  They gave her epi, benadryl, albuterol, and zofran.  She feels back to normal now.       Past Medical History:  Diagnosis Date  . ASD (atrial septal defect)   . Cancer [facility])    breast, left  . Carpal tunnel syndrome, bilateral   . Depression   . Gait disorder   . GERD (gastroesophageal reflux disease)   . Glaucoma   . Glaucoma   . History of stroke   . Hypertension   . IBS (irritable bowel syndrome)   . S/P bunionectomy    bilateral    Patient Active Problem List   Diagnosis Date Noted  . Atrial septal defect 01/05/2015  . Hand weakness 05/27/2014  . Mixed hyperlipidemia 01/01/2014  . Atrial fibrillation ([place]) 01/01/2014  . Encounter for therapeutic drug monitoring 08/06/2013  . Acute, but ill-defined, cerebrovascular disease 04/24/2013  . Fracture of femoral neck, right ([place]) 12/03/2012  . Fall 11/26/2012  . Fracture of femoral shaft, right, closed ([place]) 11/26/2012  . UTI (urinary tract infection) 11/26/2012  . Essential hypertension, benign 08/18/2012  . Status post left hip replacement 04/18/2012  . Osteomyelitis of left hip ([place]) 03/31/2012  . Breast cancer, left breast ([place]) 09/12/2011    Past Surgical History:  Procedure Laterality Date  . ABDOMINAL HYSTERECTOMY    . bladder resuspension    . CATARACT EXTRACTION Bilateral   . INCISION AND DRAINAGE HIP  03/31/2012   Procedure: IRRIGATION AND DEBRIDEMENT HIP WITH POLY EXCHANGE;  Surgeon: Rudean Haskell, MD;  Location: [facility];   Service: Orthopedics;  Laterality: Left;  Marland Kitchen MASTECTOMY  1996   left breast lumpectomy   . ORIF FEMUR FRACTURE Right 11/30/2012   Procedure: OPEN REDUCTION INTERNAL FIXATION (ORIF) DISTAL FEMUR FRACTURE;  Surgeon: Vickey Huger, MD;  Location: [facility];  Service: Orthopedics;  Laterality: Right;  . TOTAL HIP ARTHROPLASTY Bilateral   . TOTAL KNEE ARTHROPLASTY Bilateral     OB History    No data available       Home Medications    Prior to Admission medications   Medication Sig Start Date End Date Taking? Authorizing Provider  acetaminophen (TYLENOL) 500 MG tablet Take 500 mg by mouth every 6 (six) hours as needed for mild pain.    Yes Historical Provider, MD  ALPRAZolam (XANAX) 0.25 MG tablet Take 4 tablets (1 mg total) by mouth every 4 (four) hours as needed for anxiety. 12/17/12  Yes Daniel J Angiulli, PA-C  amLODipine (NORVASC) 5 MG tablet Take 1 tablet (5 mg total) by mouth daily. 01/24/16  Yes Jettie Booze, MD  atorvastatin (LIPITOR) 10 MG tablet Take 1 tablet (10 mg total) by mouth daily. 01/24/16  Yes Jettie Booze, MD  buPROPion (WELLBUTRIN XL) 300 MG 24 hr tablet Take 1 tablet (300 mg total) by mouth daily. 12/17/12  Yes Daniel J Angiulli, PA-C  calcium carbonate 200 MG capsule Take 3 capsules (  600 mg total) by mouth 2 (two) times daily with a meal. 12/17/12  Yes Daniel J Angiulli, PA-C  Cholecalciferol (VITAMIN D-1000 MAX ST) 1000 units tablet Take by mouth.   Yes Historical Provider, MD  Diclofenac Sodium (VOLTAREN PO) Take 1 tablet by mouth as directed.    Yes Historical Provider, MD  ergocalciferol (VITAMIN D2) 50000 UNITS capsule Take 50,000 Units by mouth once a week.   Yes Historical Provider, MD  latanoprost (XALATAN) 0.005 % ophthalmic solution Place 1 drop into both eyes at bedtime. 12/17/12  Yes Daniel J Angiulli, PA-C  rOPINIRole (REQUIP) 1 MG tablet Take 1 tablet (1 mg total) by mouth at bedtime as needed. For restless legs 12/17/12  Yes Daniel J Angiulli, PA-C    sertraline (ZOLOFT) 100 MG tablet Take 1.5 tablets (150 mg total) by mouth daily. 12/17/12  Yes Daniel J Angiulli, PA-C  warfarin (COUMADIN) 2 MG tablet Take 2-3 mg by mouth See admin instructions. 2 mg every day except Monday and Friday.   Yes Historical Provider, MD  EPINEPHrine 0.3 mg/0.3 mL IJ SOAJ injection Inject 0.3 mLs (0.3 mg total) into the muscle once. 09/02/16 09/02/16  Isla Pence, MD  predniSONE (STERAPRED UNI-PAK 21 TAB) 10 MG (21) TBPK tablet Take 6 tabs by mouth daily  for 2 days, then 5 tabs for 2 days, then 4 tabs for 2 days, then 3 tabs for 2 days, 2 tabs for 2 days, then 1 tab by mouth daily for 2 days 09/02/16   Isla Pence, MD  warfarin (COUMADIN) 2 MG tablet TAKE 1 TABLET (2 MG TOTAL) BY MOUTH AS DIRECTED. Patient not taking: Reported on 09/02/2016 03/21/16   Jettie Booze, MD    Family History Family History  Problem Relation Age of Onset  . Cancer Mother   . Heart failure Father   . Diabetes Sister   . Osteoarthritis Brother   . Heart attack Neg Hx     Social History Social History  Substance Use Topics  . Smoking status: Never Smoker  . Smokeless tobacco: Never Used  . Alcohol use No     Allergies   Dilaudid [hydromorphone hcl]; Hydromorphone; Morphine; Morphine and related; Sulfamethoxazole; Sulfa antibiotics; Keflex [cephalexin]; and Levaquin [levofloxacin]   Review of Systems Review of Systems  Respiratory: Positive for shortness of breath and wheezing.   Skin: Positive for rash.  All other systems reviewed and are negative.    Physical Exam Updated Vital Signs BP (!) 90/52 (BP Location: Right Arm)   Pulse 85   Temp 98.1 F (36.7 C) (Oral)   Resp 20   Ht 5\' 3"  (1.6 m)   Wt 144 lb (65.3 kg)   SpO2 98%   BMI 25.51 kg/m   Physical Exam  Constitutional: She is oriented to person, place, and time. She appears well-developed and well-nourished.  HENT:  Head: Normocephalic and atraumatic.  Right Ear: External ear normal.  Left  Ear: External ear normal.  Nose: Nose normal.  Mouth/Throat: Oropharynx is clear and moist.  Eyes: Conjunctivae and EOM are normal. Pupils are equal, round, and reactive to light.  Neck: Normal range of motion. Neck supple.  Cardiovascular: Normal rate, regular rhythm, normal heart sounds and intact distal pulses.   Pulmonary/Chest: Effort normal and breath sounds normal.  Abdominal: Soft. Bowel sounds are normal.  Musculoskeletal: Normal range of motion.  Neurological: She is alert and oriented to person, place, and time.  Skin: Skin is warm.  Psychiatric: She has a normal mood  and affect. Her behavior is normal. Judgment and thought content normal.  Nursing note and vitals reviewed.    ED Treatments / Results  Labs (all labs ordered are listed, but only abnormal results are displayed) Labs Reviewed - No data to display  EKG  EKG Interpretation  Date/Time:  Sunday September 02 2016 15:23:04 EST Ventricular Rate:  91 PR Interval:    QRS Duration: 70 QT Interval:  393 QTC Calculation: 484 R Axis:   -23 Text Interpretation:  Sinus rhythm Borderline left axis deviation Abnormal R-wave progression, early transition Borderline T wave abnormalities Baseline wander in lead(s) V1 Confirmed by Iline Buchinger MD, Jamorris Ndiaye ([id number]) on 09/02/2016 3:27:38 PM       Radiology No results found.  Procedures Procedures (including critical care time)  Medications Ordered in ED Medications  sodium chloride 0.9 % bolus 1,000 mL (not administered)  sodium chloride 0.9 % bolus 1,000 mL (1,000 mLs Intravenous New Bag/Given 09/02/16 1559)  acetaminophen (TYLENOL) tablet 1,000 mg (1,000 mg Oral Given 09/02/16 1621)     Initial Impression / Assessment and Plan / ED Course  I have reviewed the triage vital signs and the nursing notes.  Pertinent labs & imaging results that were available during my care of the patient were reviewed by me and considered in my medical decision making (see chart for  details).    Pt still feels well.  Her bp is still a little low, so she will be given an additional 1L NS.  She will be signed out to Dr. Eulis Foster at shift change for reevaluation.  Final Clinical Impressions(s) / ED Diagnoses   Final diagnoses:  Anaphylaxis, initial encounter    New Prescriptions New Prescriptions   EPINEPHRINE 0.3 MG/0.3 ML IJ SOAJ INJECTION    Inject 0.3 mLs (0.3 mg total) into the muscle once.   PREDNISONE (STERAPRED UNI-PAK 21 TAB) 10 MG (21) TBPK TABLET    Take 6 tabs by mouth daily  for 2 days, then 5 tabs for 2 days, then 4 tabs for 2 days, then 3 tabs for 2 days, 2 tabs for 2 days, then 1 tab by mouth daily for 2 days     Isla Pence, MD 09/02/16 [phone]

## 2016-09-02 NOTE — ED Provider Notes (Signed)
19: 5 0-reevaluation, after period of observation, following treatment for anaphylaxis.  At this time the patient is alert and cooperative.  She states that she feels better.  Mouth no oral angioedema.  Lungs clear to auscultation both anteriorly and posteriorly.  Heart regular rate and rhythm without murmur.  Skin scattered redness of the face and posterior torso bilaterally.  No areas of distinct urticarial abruption.  No visible signs for stings or bites as a potential cause for anaphylaxis.  Patient has not had a similar problem in the past.  Plan-DC with prescriptions for prednisone epinephrine and recommendation to use both Benadryl and Pepcid for 5 days.  PCP follow-up recommended in 2 days and suggestion for referral to allergist, given.   Daleen Bo, MD 09/02/16 [phone]

## 2016-09-02 NOTE — Discharge Instructions (Signed)
Take the prednisone as prescribed to treat the allergic reaction.  Also take antihistamines, Benadryl 25 mg 4 times a day, and Pepcid 20 mg twice a day, for 5 days.  We are prescribing an epinephrine injection to use if needed for severe allergic reaction in the future.  Always call EMS if you have to use the epinephrine.  Ask your doctor about referring you to an allergist or dermatologist, to be evaluated for potential causes of anaphylaxis.

## 2016-09-11 ENCOUNTER — Ambulatory Visit (INDEPENDENT_AMBULATORY_CARE_PROVIDER_SITE_OTHER): Payer: Medicare Other | Admitting: *Deleted

## 2016-09-11 DIAGNOSIS — L509 Urticaria, unspecified: Secondary | ICD-10-CM | POA: Diagnosis not present

## 2016-09-11 DIAGNOSIS — Z5181 Encounter for therapeutic drug level monitoring: Secondary | ICD-10-CM | POA: Diagnosis not present

## 2016-09-11 DIAGNOSIS — I6789 Other cerebrovascular disease: Secondary | ICD-10-CM

## 2016-09-11 LAB — POCT INR: INR: 2.1

## 2016-09-28 ENCOUNTER — Ambulatory Visit (INDEPENDENT_AMBULATORY_CARE_PROVIDER_SITE_OTHER): Payer: Medicare Other

## 2016-09-28 DIAGNOSIS — I6789 Other cerebrovascular disease: Secondary | ICD-10-CM

## 2016-09-28 DIAGNOSIS — Z5181 Encounter for therapeutic drug level monitoring: Secondary | ICD-10-CM

## 2016-09-28 LAB — POCT INR: INR: 2.8

## 2016-10-10 ENCOUNTER — Other Ambulatory Visit: Payer: Self-pay | Admitting: Emergency Medicine

## 2016-10-10 DIAGNOSIS — C50912 Malignant neoplasm of unspecified site of left female breast: Secondary | ICD-10-CM

## 2016-10-11 ENCOUNTER — Other Ambulatory Visit (HOSPITAL_BASED_OUTPATIENT_CLINIC_OR_DEPARTMENT_OTHER): Payer: Medicare Other

## 2016-10-11 ENCOUNTER — Telehealth: Payer: Self-pay | Admitting: Oncology

## 2016-10-11 ENCOUNTER — Ambulatory Visit (HOSPITAL_BASED_OUTPATIENT_CLINIC_OR_DEPARTMENT_OTHER): Payer: Medicare Other | Admitting: Oncology

## 2016-10-11 DIAGNOSIS — Z853 Personal history of malignant neoplasm of breast: Secondary | ICD-10-CM | POA: Diagnosis not present

## 2016-10-11 DIAGNOSIS — Z170 Estrogen receptor positive status [ER+]: Principal | ICD-10-CM

## 2016-10-11 DIAGNOSIS — C50812 Malignant neoplasm of overlapping sites of left female breast: Secondary | ICD-10-CM | POA: Insufficient documentation

## 2016-10-11 DIAGNOSIS — C50912 Malignant neoplasm of unspecified site of left female breast: Secondary | ICD-10-CM

## 2016-10-11 LAB — COMPREHENSIVE METABOLIC PANEL
ALT: 10 U/L (ref 0–55)
ANION GAP: 10 meq/L (ref 3–11)
AST: 20 U/L (ref 5–34)
Albumin: 3.9 g/dL (ref 3.5–5.0)
Alkaline Phosphatase: 103 U/L (ref 40–150)
BUN: 15.6 mg/dL (ref 7.0–26.0)
CALCIUM: 9.8 mg/dL (ref 8.4–10.4)
CHLORIDE: 109 meq/L (ref 98–109)
CO2: 24 meq/L (ref 22–29)
CREATININE: 0.9 mg/dL (ref 0.6–1.1)
EGFR: 60 mL/min/{1.73_m2} — ABNORMAL LOW (ref >90)
Glucose: 80 mg/dl (ref 70–140)
POTASSIUM: 4.4 meq/L (ref 3.5–5.1)
Sodium: 142 mEq/L (ref 136–145)
Total Bilirubin: 0.78 mg/dL (ref 0.20–1.20)
Total Protein: 6.8 g/dL (ref 6.4–8.3)

## 2016-10-11 LAB — CBC WITH DIFFERENTIAL/PLATELET
BASO%: 2.5 % — AB (ref 0.0–2.0)
BASOS ABS: 0.2 10*3/uL — AB (ref 0.0–0.1)
EOS%: 1.7 % (ref 0.0–7.0)
Eosinophils Absolute: 0.1 10*3/uL (ref 0.0–0.5)
HEMATOCRIT: 43.2 % (ref 34.8–46.6)
HGB: 14.6 g/dL (ref 11.6–15.9)
LYMPH#: 1.6 10*3/uL (ref 0.9–3.3)
LYMPH%: 21.6 % (ref 14.0–49.7)
MCH: 29.2 pg (ref 25.1–34.0)
MCHC: 33.7 g/dL (ref 31.5–36.0)
MCV: 86.4 fL (ref 79.5–101.0)
MONO#: 0.4 10*3/uL (ref 0.1–0.9)
MONO%: 5.3 % (ref 0.0–14.0)
NEUT#: 5 10*3/uL (ref 1.5–6.5)
NEUT%: 68.9 % (ref 38.4–76.8)
Platelets: 218 10*3/uL (ref 145–400)
RBC: 5 10*6/uL (ref 3.70–5.45)
RDW: 14.9 % — ABNORMAL HIGH (ref 11.2–14.5)
WBC: 7.3 10*3/uL (ref 3.9–10.3)

## 2016-10-11 NOTE — Telephone Encounter (Signed)
Gave patient avs report and appointments for June 2019.

## 2016-10-11 NOTE — Progress Notes (Signed)
Hematology and Oncology Follow Up Visit  Renee Adams [id number]   [id number]  10-26-1933 83 y.o. 10/11/2016   PCP:  Donnie Coffin, MD  CHIEF COMPLAINT: Remote Hx of Left Breast Cancer  CURRENT TREATMENT: observation  BREAST CANCER HISTORY: [name] was originally diagnosed with a left breast carcinoma in early 1996. Tumor was T1, N0, ER and PR positive. She underwent left lumpectomy and sentinel lymph node dissection in March of 1996.  Patient was treated with tamoxifen for 5 years, then Evista for 3 years. The Evsita was then discontinued secondary to history of stroke.   Currently she is being followed with observation alone.  INTERIM HISTORY: Renee Adams returns today for follow-up of her remote breast cancer. Interval history is generally unremarkable except that sometime in March 2018 she developed some chest discomfort and flushing in her upper body which took her to the emergency room. He was felt to be having a reaction to something, possibly something she ate, because there was no insect bite found. She was treated with epinephrine and prednisone and the symptoms completely cleared.  REVIEW OF SYSTEMS: Delorese uses a walker and is very conscious of the possibility of falls. She has pain in her knees and legs and sees Dr. Ihor Gully regarding this. She feels very tired and sees Dr. Alroy Dust her primary care doctor regarding this. Overall however she is pretty much like she was last year, she thinks. Abdomen no major changes one way or the other and a detailed review of systems was stable  Medications:   Current Outpatient Prescriptions  Medication Sig Dispense Refill  . acetaminophen (TYLENOL) 500 MG tablet Take 500 mg by mouth every 6 (six) hours as needed for mild pain.     Marland Kitchen ALPRAZolam (XANAX) 0.25 MG tablet Take 4 tablets (1 mg total) by mouth every 4 (four) hours as needed for anxiety. 40 tablet 0  . amLODipine (NORVASC) 5 MG tablet Take 1 tablet (5 mg total) by mouth daily. 30 tablet  5  . atorvastatin (LIPITOR) 10 MG tablet Take 1 tablet (10 mg total) by mouth daily. 30 tablet 4  . buPROPion (WELLBUTRIN XL) 300 MG 24 hr tablet Take 1 tablet (300 mg total) by mouth daily. 30 tablet 1  . calcium carbonate 200 MG capsule Take 3 capsules (600 mg total) by mouth 2 (two) times daily with a meal. 60 capsule 1  . Cholecalciferol (VITAMIN D-1000 MAX ST) 1000 units tablet Take by mouth.    . Diclofenac Sodium (VOLTAREN PO) Take 1 tablet by mouth as directed.     Marland Kitchen EPINEPHrine 0.3 mg/0.3 mL IJ SOAJ injection Inject into the muscle.    . ergocalciferol (VITAMIN D2) 50000 UNITS capsule Take 50,000 Units by mouth once a week.    . latanoprost (XALATAN) 0.005 % ophthalmic solution Place 1 drop into both eyes at bedtime. 2.5 mL 12  . predniSONE (STERAPRED UNI-PAK 21 TAB) 10 MG (21) TBPK tablet Take 6 tabs by mouth daily  for 2 days, then 5 tabs for 2 days, then 4 tabs for 2 days, then 3 tabs for 2 days, 2 tabs for 2 days, then 1 tab by mouth daily for 2 days 42 tablet 0  . rOPINIRole (REQUIP) 1 MG tablet Take 1 tablet (1 mg total) by mouth at bedtime as needed. For restless legs 30 tablet 0  . sertraline (ZOLOFT) 100 MG tablet Take 1.5 tablets (150 mg total) by mouth daily. 30 tablet 1  . warfarin (COUMADIN) 2 MG tablet TAKE  1 TABLET (2 MG TOTAL) BY MOUTH AS DIRECTED. (Patient not taking: Reported on 09/02/2016) 45 tablet 3   No current facility-administered medications for this visit.     Allergies:  Allergies  Allergen Reactions  . Dilaudid [Hydromorphone Hcl] Shortness Of Breath    Tolerates tramadol  . Hydromorphone Other (See Comments)    UNKNOWN TO PATIENT  . Morphine Other (See Comments)    UNKNOWN TO PATIENT  . Morphine And Related Shortness Of Breath    Tolerates tramadol  . Sulfamethoxazole Other (See Comments)    UNKNOWN TO PATIENT  . Sulfa Antibiotics Other (See Comments)    "Made me drunk";  wobbly  . Keflex [Cephalexin] Nausea Only  . Levaquin [Levofloxacin] Hives  and Rash    Physical Exam: Elderly white womanWho appears stated age Vitals:   10/11/16 1302  BP: (!) 150/82  Pulse: 79  Resp: 20  Temp: 98 F (36.7 C)    Body mass index is 26.15 kg/m.  ECOG:  1 Filed Weights   10/11/16 1302  Weight: 147 lb 9.6 oz (67 kg)   Sclerae unicteric, EOMs intact Oropharynx clear and moist No cervical or supraclavicular adenopathy Lungs no rales or rhonchi Heart regular rate and rhythm Abd soft, nontender, positive bowel sounds MSK no focal spinal tenderness, no upper extremity lymphedema Neuro: nonfocal, well oriented, appropriate affect Breasts: The right breast is benign. The left breast is status post lumpectomy with no evidence of local recurrence. Both axillae are benign.  Lab Results: Lab Results  Component Value Date   WBC 7.3 10/11/2016   HGB 14.6 10/11/2016   HCT 43.2 10/11/2016   MCV 86.4 10/11/2016   PLT 218 10/11/2016   NEUTROABS 5.0 10/11/2016     Chemistry      Component Value Date/Time   NA 142 10/11/2016 1242   K 4.4 10/11/2016 1242   CL 107 12/05/2012 1557   CL 109 (H) 09/10/2012 1315   CO2 24 10/11/2016 1242   BUN 15.6 10/11/2016 1242   CREATININE 0.9 10/11/2016 1242      Component Value Date/Time   CALCIUM 9.8 10/11/2016 1242   ALKPHOS 103 10/11/2016 1242   AST 20 10/11/2016 1242   ALT 10 10/11/2016 1242   BILITOT 0.78 10/11/2016 1242      STUDIES: May 2017 mammography reviewed Assessment:  83 y.o. [place] woman   (1)  status post left lumpectomy and sentinel lymph node dissection March 1996 for a T1, N0, ER/PR positive breast carcinoma.    (2)  Treated with tamoxifen for five years, then Evista for three years with the Evista discontinued secondary to stroke.    (3)  Currently being followed with observation alone.   Plan:  Renee Adams Is now 22 years out from definitive surgery for her breast cancer with no evidence of disease recurrence. She is very likely cured.  She derives reassurance, she tells  me, from being seen here on a yearly basis and I am glad to support that for her.  Today we discussed fall prevention strategies and what activities she may safely participate and so to maintain and improve her functional status  She will see me again in June of next year, so we will have the more recent mammogram available for review.. She knows to call for any problems that may develop before then.  Chauncey Cruel, MD 10/11/2016

## 2016-10-16 ENCOUNTER — Other Ambulatory Visit: Payer: Self-pay | Admitting: Interventional Cardiology

## 2016-10-17 ENCOUNTER — Other Ambulatory Visit: Payer: Self-pay | Admitting: Family Medicine

## 2016-10-17 DIAGNOSIS — Z1231 Encounter for screening mammogram for malignant neoplasm of breast: Secondary | ICD-10-CM

## 2016-10-26 ENCOUNTER — Ambulatory Visit (INDEPENDENT_AMBULATORY_CARE_PROVIDER_SITE_OTHER): Payer: Medicare Other | Admitting: *Deleted

## 2016-10-26 DIAGNOSIS — Z5181 Encounter for therapeutic drug level monitoring: Secondary | ICD-10-CM | POA: Diagnosis not present

## 2016-10-26 DIAGNOSIS — I6789 Other cerebrovascular disease: Secondary | ICD-10-CM

## 2016-10-26 LAB — POCT INR: INR: 6.8

## 2016-10-26 LAB — PROTIME-INR
INR: 6.7 (ref 0.8–1.2)
PROTHROMBIN TIME: 66.1 s — AB (ref 9.1–12.0)

## 2016-10-26 NOTE — Progress Notes (Signed)
inr pt

## 2016-10-28 ENCOUNTER — Emergency Department (HOSPITAL_COMMUNITY): Payer: Medicare Other

## 2016-10-28 ENCOUNTER — Encounter (HOSPITAL_COMMUNITY): Payer: Self-pay

## 2016-10-28 ENCOUNTER — Emergency Department (HOSPITAL_COMMUNITY)
Admission: EM | Admit: 2016-10-28 | Discharge: 2016-10-29 | Disposition: A | Discharge status: Home / Self Care | Payer: Medicare Other | Attending: Emergency Medicine | Admitting: Emergency Medicine

## 2016-10-28 DIAGNOSIS — M25559 Pain in unspecified hip: Secondary | ICD-10-CM | POA: Diagnosis not present

## 2016-10-28 DIAGNOSIS — Z96643 Presence of artificial hip joint, bilateral: Secondary | ICD-10-CM | POA: Diagnosis not present

## 2016-10-28 DIAGNOSIS — M25562 Pain in left knee: Secondary | ICD-10-CM | POA: Insufficient documentation

## 2016-10-28 DIAGNOSIS — M25462 Effusion, left knee: Secondary | ICD-10-CM | POA: Diagnosis not present

## 2016-10-28 DIAGNOSIS — Z96653 Presence of artificial knee joint, bilateral: Secondary | ICD-10-CM | POA: Insufficient documentation

## 2016-10-28 DIAGNOSIS — Z79899 Other long term (current) drug therapy: Secondary | ICD-10-CM | POA: Insufficient documentation

## 2016-10-28 DIAGNOSIS — Z7901 Long term (current) use of anticoagulants: Secondary | ICD-10-CM | POA: Insufficient documentation

## 2016-10-28 DIAGNOSIS — Z853 Personal history of malignant neoplasm of breast: Secondary | ICD-10-CM | POA: Diagnosis not present

## 2016-10-28 DIAGNOSIS — I10 Essential (primary) hypertension: Secondary | ICD-10-CM | POA: Insufficient documentation

## 2016-10-28 LAB — BASIC METABOLIC PANEL
Anion gap: 8 (ref 5–15)
BUN: 18 mg/dL (ref 6–20)
CALCIUM: 9.1 mg/dL (ref 8.9–10.3)
CO2: 25 mmol/L (ref 22–32)
CREATININE: 0.99 mg/dL (ref 0.44–1.00)
Chloride: 105 mmol/L (ref 101–111)
GFR calc Af Amer: >60 mL/min (ref >60)
GFR, EST NON AFRICAN AMERICAN: 52 mL/min — AB (ref >60)
GLUCOSE: 137 mg/dL — AB (ref 65–99)
Potassium: 4 mmol/L (ref 3.5–5.1)
SODIUM: 138 mmol/L (ref 135–145)

## 2016-10-28 LAB — CBC WITH DIFFERENTIAL/PLATELET
BASOS ABS: 0.1 10*3/uL (ref 0.0–0.1)
BASOS PCT: 0 %
EOS ABS: 0 10*3/uL (ref 0.0–0.7)
EOS PCT: 0 %
HCT: 42.2 % (ref 36.0–46.0)
Hemoglobin: 14.2 g/dL (ref 12.0–15.0)
LYMPHS PCT: 7 %
Lymphs Abs: 0.9 10*3/uL (ref 0.7–4.0)
MCH: 29.8 pg (ref 26.0–34.0)
MCHC: 33.6 g/dL (ref 30.0–36.0)
MCV: 88.7 fL (ref 78.0–100.0)
MONO ABS: 0.7 10*3/uL (ref 0.1–1.0)
Monocytes Relative: 5 %
Neutro Abs: 11.6 10*3/uL — ABNORMAL HIGH (ref 1.7–7.7)
Neutrophils Relative %: 88 %
PLATELETS: 168 10*3/uL (ref 150–400)
RBC: 4.76 MIL/uL (ref 3.87–5.11)
RDW: 14.9 % (ref 11.5–15.5)
WBC: 13.3 10*3/uL — AB (ref 4.0–10.5)

## 2016-10-28 LAB — PROTIME-INR
INR: 2.28
Prothrombin Time: 25.5 seconds — ABNORMAL HIGH (ref 11.4–15.2)

## 2016-10-28 MED ORDER — SERTRALINE HCL 50 MG PO TABS
150.0000 mg | ORAL_TABLET | Freq: Every day | ORAL | Status: DC
  Administered 2016-10-28: 150 mg via ORAL
  Filled 2016-10-28: qty 3

## 2016-10-28 MED ORDER — ROPINIROLE HCL 1 MG PO TABS
1.0000 mg | ORAL_TABLET | Freq: Every evening | ORAL | Status: DC | PRN
  Filled 2016-10-28: qty 1

## 2016-10-28 MED ORDER — ATORVASTATIN CALCIUM 10 MG PO TABS
10.0000 mg | ORAL_TABLET | Freq: Every day | ORAL | Status: DC
  Administered 2016-10-28: 10 mg via ORAL
  Filled 2016-10-28 (×2): qty 1

## 2016-10-28 MED ORDER — OXYCODONE HCL 5 MG PO TABS
2.5000 mg | ORAL_TABLET | Freq: Four times a day (QID) | ORAL | Status: DC | PRN
  Administered 2016-10-29: 2.5 mg via ORAL
  Filled 2016-10-28: qty 1

## 2016-10-28 MED ORDER — AMLODIPINE BESYLATE 5 MG PO TABS: 5.0000 mg | ORAL_TABLET | Freq: Every day | ORAL | Status: DC

## 2016-10-28 MED ORDER — SODIUM CHLORIDE 0.9 % IV BOLUS (SEPSIS)
1000.0000 mL | Freq: Once | INTRAVENOUS | Status: AC
  Administered 2016-10-28: 1000 mL via INTRAVENOUS

## 2016-10-28 MED ORDER — OXYCODONE HCL 5 MG PO TABS: 2.5000 mg | ORAL_TABLET | ORAL | 0 refills | Status: DC | PRN

## 2016-10-28 MED ORDER — BUPROPION HCL ER (XL) 150 MG PO TB24
300.0000 mg | ORAL_TABLET | Freq: Every day | ORAL | Status: DC
  Administered 2016-10-28: 300 mg via ORAL
  Filled 2016-10-28: qty 2

## 2016-10-28 MED ORDER — WARFARIN SODIUM 2 MG PO TABS
2.0000 mg | ORAL_TABLET | Freq: Every day | ORAL | Status: DC
  Administered 2016-10-28: 2 mg via ORAL
  Filled 2016-10-28: qty 1

## 2016-10-28 MED ORDER — OXYCODONE HCL 5 MG PO TABS
2.5000 mg | ORAL_TABLET | Freq: Once | ORAL | Status: AC
Start: 2016-10-28 — End: 2016-10-28
  Administered 2016-10-28: 2.5 mg via ORAL
  Filled 2016-10-28: qty 1

## 2016-10-28 MED ORDER — WARFARIN - PHYSICIAN DOSING INPATIENT
Freq: Every day | Status: DC
  Administered 2016-10-28: 19:00:00

## 2016-10-28 MED ORDER — LATANOPROST 0.005 % OP SOLN
1.0000 [drp] | Freq: Every day | OPHTHALMIC | Status: DC
  Administered 2016-10-28: 1 [drp] via OPHTHALMIC
  Filled 2016-10-28: qty 2.5

## 2016-10-28 MED ORDER — ACETAMINOPHEN 500 MG PO TABS
1000.0000 mg | ORAL_TABLET | Freq: Once | ORAL | Status: AC
  Administered 2016-10-28: 1000 mg via ORAL
  Filled 2016-10-28: qty 2

## 2016-10-28 NOTE — ED Notes (Signed)
Patient ambulate in the room using walker and two people assist. Pt move very slow.

## 2016-10-28 NOTE — ED Notes (Addendum)
Pt daughter [name] will be pick pt up tomorrow her # [phone]. And her son Reita Cliche # is [phone]

## 2016-10-28 NOTE — Progress Notes (Signed)
CSW received inappropriate consult for home health needs for patient, CSW contacted and left voicemail for Weekend CM [phone]) requesting that she follow up with patient. CSW signing off, please consult if new needs arise.    Abundio Miu, [facility]  Clinical Social Worker [phone]

## 2016-10-28 NOTE — ED Notes (Signed)
Patient was ambulated in room with a walker

## 2016-10-28 NOTE — ED Notes (Signed)
Patient transported to X-ray 

## 2016-10-28 NOTE — ED Triage Notes (Signed)
Pt coming from by EMS with c/o left knee pain started yesterday morning and getting worse. Pt denies fall and any injury to the site. Pt had left knee replacement in 2004.

## 2016-10-28 NOTE — ED Notes (Addendum)
Patient is spending the night in ED RM 10 until her daughter come to get her tomorrow. Per Dr. Tyrone Nine.

## 2016-10-28 NOTE — Care Management Note (Signed)
Case Management Note  Patient Details  Name: Renee Adams MRN: [id number] Date of Birth: [birth year]  Subjective/Objective:     Left knee swelling                Action/Plan: Discharge Planning:  NCM spoke to pt and son, Renee Adams at bedside. Offered choice/list provided for [facility] and pt states she had [facility] in the past. Contacted [facility] with new referral. Pt reports having RW and 3n1 at home. States she uses her RW at home. Lives at home alone and getting around has been difficult due to left knee pain. States she follows up with Dr Alvan Dame. Will call to schedule follow up appt with Ortho MD for next week. States she had multiple surgeries on her knee and hips in the past. States her dtr lives 3 hours away and son works so assistance is limited in home. States she has a Youth worker that will assist with paying for SNF rehab. Explained to follow up with [facility] [employer] on Monday to find out details of policy and how to utilize. She went to [facility] in the past for rehab. Explained she can also pay a private duty caregiver to assist with ADL's at home but it would be an out of pocket expense. Provided list of private duty agencies.   PCP  Renee Adams, Renee Adams   Expected Discharge Date:  10/28/2016              Expected Discharge Plan:  [place]  In-House Referral:  Clinical Social Work  Discharge planning Services  CM Consult  Post Acute Care Choice:  Home Health Choice offered to:  Patient  DME Arranged:  N/A DME Agency:  NA  HH Arranged:  PT, OT, Nurse's Aide, Social Work [employer] Agency:  [facility] [employer]  Status of Service:  Completed, signed off  If discussed at [employer], dates discussed:    Additional Comments:  Renee Rasher, RN 10/28/2016, 12:04 PM

## 2016-10-28 NOTE — Discharge Instructions (Signed)
Take tylenol 1000mg(2 extra strength) four times a day.  ° °Then take the pain medicine if you feel like you need it. Narcotics do not help with the pain, they only make you care about it less.  You can become addicted to this, people may break into your house to steal it.  It will constipate you.  If you drive under the influence of this medicine you can get a DUI.   ° °

## 2016-10-28 NOTE — ED Provider Notes (Signed)
[place] DEPT Provider Note   CSN: [id number] Arrival date & time: 10/28/16  0901     History   Chief Complaint Chief Complaint  Patient presents with  . left knee pain    HPI Renee Adams is a 83 y.o. female.  83 yo F with a chief complaint of left knee pain and swelling. Going on for the past couple days. Getting progressively worse. Patient having trouble bearing weight on that leg due to pain. Had a history of a knee replacement in 2014 by Dr. Lorre Nick.  Denies fevers or chills denies redness. Denies injury.   The history is provided by the patient.  Knee Pain   This is a recurrent problem. The current episode started 2 days ago. The problem occurs constantly. The problem has not changed since onset.The pain is present in the left knee. The quality of the pain is described as constant. The pain is at a severity of 10/10. The pain is severe. She has tried nothing for the symptoms. The treatment provided no relief. There has been no history of extremity trauma.    Past Medical History:  Diagnosis Date  . ASD (atrial septal defect)   . Cancer [facility])    breast, left  . Carpal tunnel syndrome, bilateral   . Depression   . Gait disorder   . GERD (gastroesophageal reflux disease)   . Glaucoma   . Glaucoma   . History of stroke   . Hypertension   . IBS (irritable bowel syndrome)   . S/P bunionectomy    bilateral    Patient Active Problem List   Diagnosis Date Noted  . Malignant neoplasm of overlapping sites of left breast in female, estrogen receptor positive ([place]) 10/11/2016  . Atrial septal defect 01/05/2015  . Hand weakness 05/27/2014  . Mixed hyperlipidemia 01/01/2014  . Atrial fibrillation ([place]) 01/01/2014  . Encounter for therapeutic drug monitoring 08/06/2013  . Acute, but ill-defined, cerebrovascular disease 04/24/2013  . Fracture of femoral neck, right ([place]) 12/03/2012  . Fall 11/26/2012  . Fracture of femoral shaft, right, closed ([place]) 11/26/2012  .  UTI (urinary tract infection) 11/26/2012  . Essential hypertension, benign 08/18/2012  . Status post left hip replacement 04/18/2012  . Osteomyelitis of left hip ([place]) 03/31/2012  . Breast cancer, left breast ([place]) 09/12/2011    Past Surgical History:  Procedure Laterality Date  . ABDOMINAL HYSTERECTOMY    . bladder resuspension    . CATARACT EXTRACTION Bilateral   . INCISION AND DRAINAGE HIP  03/31/2012   Procedure: IRRIGATION AND DEBRIDEMENT HIP WITH POLY EXCHANGE;  Surgeon: Rudean Haskell, MD;  Location: [place];  Service: Orthopedics;  Laterality: Left;  Marland Kitchen MASTECTOMY  1996   left breast lumpectomy   . ORIF FEMUR FRACTURE Right 11/30/2012   Procedure: OPEN REDUCTION INTERNAL FIXATION (ORIF) DISTAL FEMUR FRACTURE;  Surgeon: Vickey Huger, MD;  Location: [place];  Service: Orthopedics;  Laterality: Right;  . TOTAL HIP ARTHROPLASTY Bilateral   . TOTAL KNEE ARTHROPLASTY Bilateral     OB History    No data available       Home Medications    Prior to Admission medications   Medication Sig Start Date End Date Taking? Authorizing Provider  ALPRAZolam (XANAX) 0.25 MG tablet Take 4 tablets (1 mg total) by mouth every 4 (four) hours as needed for anxiety. 12/17/12  Yes Daniel J Angiulli, PA-C  amLODipine (NORVASC) 5 MG tablet Take 1 tablet (5 mg total) by mouth daily. 01/24/16  Yes Jettie Booze, MD  atorvastatin (LIPITOR) 10 MG tablet Take 1 tablet (10 mg total) by mouth daily. 01/24/16  Yes Jettie Booze, MD  buPROPion (WELLBUTRIN XL) 300 MG 24 hr tablet Take 1 tablet (300 mg total) by mouth daily. 12/17/12  Yes Daniel J Angiulli, PA-C  calcium carbonate 200 MG capsule Take 3 capsules (600 mg total) by mouth 2 (two) times daily with a meal. 12/17/12  Yes Daniel J Angiulli, PA-C  Cholecalciferol (VITAMIN D-1000 MAX ST) 1000 units tablet Take by mouth.   Yes Historical Provider, MD  diclofenac sodium (VOLTAREN) 1 % GEL Apply 2 g topically 4 (four) times daily.   Yes Historical  Provider, MD  ergocalciferol (VITAMIN D2) 50000 UNITS capsule Take 50,000 Units by mouth once a week.   Yes Historical Provider, MD  latanoprost (XALATAN) 0.005 % ophthalmic solution Place 1 drop into both eyes at bedtime. 12/17/12  Yes Daniel J Angiulli, PA-C  rOPINIRole (REQUIP) 1 MG tablet Take 1 tablet (1 mg total) by mouth at bedtime as needed. For restless legs 12/17/12  Yes Daniel J Angiulli, PA-C  sertraline (ZOLOFT) 100 MG tablet Take 1.5 tablets (150 mg total) by mouth daily. 12/17/12  Yes Daniel J Angiulli, PA-C  warfarin (COUMADIN) 2 MG tablet TAKE 1 TABLET BY MOUTH (2 MG TOTAL) AS DIRECTED. 10/17/16  Yes Jettie Booze, MD  EPINEPHrine 0.3 mg/0.3 mL IJ SOAJ injection Inject into the muscle.    Historical Provider, MD  oxyCODONE (ROXICODONE) 5 MG immediate release tablet Take 0.5 tablets (2.5 mg total) by mouth every 4 (four) hours as needed for severe pain. 10/28/16   Deno Etienne, DO    Family History Family History  Problem Relation Age of Onset  . Cancer Mother   . Heart failure Father   . Diabetes Sister   . Osteoarthritis Brother   . Heart attack Neg Hx     Social History Social History  Substance Use Topics  . Smoking status: Never Smoker  . Smokeless tobacco: Never Used  . Alcohol use No     Allergies   Dilaudid [hydromorphone hcl]; Hydromorphone; Morphine; Morphine and related; Sulfamethoxazole; Sulfa antibiotics; Keflex [cephalexin]; and Levaquin [levofloxacin]   Review of Systems Review of Systems  Constitutional: Negative for chills and fever.  HENT: Negative for congestion and rhinorrhea.   Eyes: Negative for redness and visual disturbance.  Respiratory: Negative for shortness of breath and wheezing.   Cardiovascular: Negative for chest pain and palpitations.  Gastrointestinal: Negative for nausea and vomiting.  Genitourinary: Negative for dysuria and urgency.  Musculoskeletal: Positive for arthralgias. Negative for myalgias.  Skin: Negative for pallor  and wound.  Neurological: Negative for dizziness and headaches.     Physical Exam Updated Vital Signs BP (!) 103/54   Pulse 72   Temp 98.9 F (37.2 C) (Oral)   Resp 14   Ht 5\' 3"  (1.6 m)   Wt 145 lb (65.8 kg)   SpO2 91%   BMI 25.69 kg/m   Physical Exam  Constitutional: She is oriented to person, place, and time. She appears well-developed and well-nourished. No distress.  HENT:  Head: Normocephalic and atraumatic.  Eyes: EOM are normal. Pupils are equal, round, and reactive to light.  Neck: Normal range of motion. Neck supple.  Cardiovascular: Normal rate and regular rhythm.  Exam reveals no gallop and no friction rub.   No murmur heard. Pulmonary/Chest: Effort normal. She has no wheezes. She has no rales.  Abdominal: Soft. She exhibits  no distension and no mass. There is no tenderness. There is no guarding.  Musculoskeletal: She exhibits edema and tenderness.  Mild swelling and diffuse pain to the left knee.  Stable.  PMS intact distally.  Old surgical scar noted  Neurological: She is alert and oriented to person, place, and time.  Skin: Skin is warm and dry. She is not diaphoretic.  Psychiatric: She has a normal mood and affect. Her behavior is normal.  Nursing note and vitals reviewed.    ED Treatments / Results  Labs (all labs ordered are listed, but only abnormal results are displayed) Labs Reviewed  PROTIME-INR - Abnormal; Notable for the following:       Result Value   Prothrombin Time 25.5 (*)    All other components within normal limits  BASIC METABOLIC PANEL - Abnormal; Notable for the following:    Glucose, Bld 137 (*)    GFR calc non Af Amer 52 (*)    All other components within normal limits  CBC WITH DIFFERENTIAL/PLATELET - Abnormal; Notable for the following:    WBC 13.3 (*)    Neutro Abs 11.6 (*)    All other components within normal limits    EKG  EKG Interpretation None       Radiology Dg Knee Complete 4 Views Left  Result Date:  10/28/2016 CLINICAL DATA:  Pt coming from by EMS with c/o left knee pain started yesterday morning and getting worse. Pt denies fall and any injury to the site. Pt had left knee replacement in 2004 EXAM: LEFT KNEE - COMPLETE 4+ VIEW COMPARISON:  None. FINDINGS: No fracture. Left knee prosthetic components appear well seated and aligned. No evidence of loosening. Bones are demineralized.  No bone lesion. Small to moderate joint effusion distends the suprapatellar joint capsule. Soft tissues are unremarkable. IMPRESSION: 1. No fracture, bone lesion or evidence of loosening of the orthopedic hardware. 2. Small to moderate joint effusion. Electronically Signed   By: Lajean Manes M.D.   On: 10/28/2016 09:51    Procedures Procedures (including critical care time)  Medications Ordered in ED Medications  oxyCODONE (Oxy IR/ROXICODONE) immediate release tablet 2.5 mg (not administered)  acetaminophen (TYLENOL) tablet 1,000 mg (1,000 mg Oral Given 10/28/16 1011)  oxyCODONE (Oxy IR/ROXICODONE) immediate release tablet 2.5 mg (2.5 mg Oral Given 10/28/16 1011)  sodium chloride 0.9 % bolus 1,000 mL (0 mLs Intravenous Stopped 10/28/16 1229)     Initial Impression / Assessment and Plan / ED Course  I have reviewed the triage vital signs and the nursing notes.  Pertinent labs & imaging results that were available during my care of the patient were reviewed by me and considered in my medical decision making (see chart for details).     83 yo F with a chief complaint of left knee pain and swelling. I doubt septic arthritis as the patient does not have a fever there is no overlying erythema in the range the knee with minimal tenderness. Suspect the patient likely has a hemarthrosis as she recently had an INR of 6, 2 days ago in the office. She is on Coumadin for atrial fibrillation.  On reassessment I discussed the imaging results. Discussed likely need for follow-up. The family was noticeably angry that the  patient was not being admitted. Stated that she has not had anything to eat or drink in the last couple days. I will obtain labs to evaluate for acute kidney injury.  Patient's labs with a leukocytosis. Otherwise unremarkable.. The family  is concerned because the patient is unable to get up and around that she is unsafe to go home where she lives alone. I will discuss the case with the hospitalist.  No acute need for admission, patient unsafe for home will board in the ED.  She feels she will likely be ok for home tomorrow with relatives arriving to help her at home.   The patients results and plan were reviewed and discussed.   Any x-rays performed were independently reviewed by myself.   Differential diagnosis were considered with the presenting HPI.  Medications  oxyCODONE (Oxy IR/ROXICODONE) immediate release tablet 2.5 mg (not administered)  acetaminophen (TYLENOL) tablet 1,000 mg (1,000 mg Oral Given 10/28/16 1011)  oxyCODONE (Oxy IR/ROXICODONE) immediate release tablet 2.5 mg (2.5 mg Oral Given 10/28/16 1011)  sodium chloride 0.9 % bolus 1,000 mL (0 mLs Intravenous Stopped 10/28/16 1229)    Vitals:   10/28/16 0925 10/28/16 1255 10/28/16 1257 10/28/16 1300  BP:   (!) 91/53 (!) 103/54  Pulse:   75 72  Resp:   14   Temp: 98.5 F (36.9 C) 98.9 F (37.2 C) 98.9 F (37.2 C)   TempSrc: Oral Oral Oral   SpO2:   92% 91%  Weight:   145 lb (65.8 kg)   Height:   5\' 3"  (1.6 m)     Final diagnoses:  Acute pain of left knee     Final Clinical Impressions(s) / ED Diagnoses   Final diagnoses:  Acute pain of left knee    New Prescriptions New Prescriptions   OXYCODONE (ROXICODONE) 5 MG IMMEDIATE RELEASE TABLET    Take 0.5 tablets (2.5 mg total) by mouth every 4 (four) hours as needed for severe pain.     Deno Etienne, DO 10/28/16 1406

## 2016-10-28 NOTE — ED Notes (Signed)
Bed: [facility] Expected date:  Expected time:  Means of arrival:  Comments:  83 yo knee pain- no injury

## 2016-10-28 NOTE — Progress Notes (Signed)
PHARMACIST - PHYSICIAN COMMUNICATION CONCERNING: Pharmacy Care Issues Regarding Warfarin Labs  RECOMMENDATION (Action Taken): A baseline and daily protime for three days has been ordered to meet the [facility] Patient safety goal and comply with the current [place].   The Pharmacy will defer all warfarin dose order changes and follow up of lab results to the prescriber unless an additional order to initiate a "pharmacy Coumadin consult" is placed.  DESCRIPTION:  While hospitalized, to be in compliance with [facility] Patient Safety Goals, all patients on warfarin must have a baseline and/or current protime prior to the administration of warfarin. Pharmacy has received your order for warfarin without these required laboratory assessments.   Hershal Coria, PharmD, BCPS Pager: [phone] 10/28/2016 5:16 PM

## 2016-10-29 DIAGNOSIS — M25562 Pain in left knee: Secondary | ICD-10-CM | POA: Diagnosis not present

## 2016-10-29 LAB — PROTIME-INR
INR: 2.59
Prothrombin Time: 28.2 seconds — ABNORMAL HIGH (ref 11.4–15.2)

## 2016-10-29 NOTE — ED Notes (Signed)
Pt assisted with bedpan to urinate. No other needs by pt at this time.

## 2016-10-29 NOTE — ED Notes (Signed)
Bed: [facility] Expected date:  Expected time:  Means of arrival:  Comments: 

## 2016-10-29 NOTE — ED Notes (Signed)
D/c instructions given to daughter along with home health resource sheet

## 2016-10-29 NOTE — ED Notes (Signed)
Pt moved to hall B, meal given

## 2016-10-29 NOTE — ED Notes (Signed)
Morning labs obtained and pt requested sandwich and ginger ale. Sandwich and drink provided. No other needs at this time.

## 2016-10-30 DIAGNOSIS — H409 Unspecified glaucoma: Secondary | ICD-10-CM | POA: Diagnosis not present

## 2016-10-30 DIAGNOSIS — M25562 Pain in left knee: Secondary | ICD-10-CM | POA: Diagnosis not present

## 2016-10-30 DIAGNOSIS — G5601 Carpal tunnel syndrome, right upper limb: Secondary | ICD-10-CM | POA: Diagnosis not present

## 2016-10-30 DIAGNOSIS — I10 Essential (primary) hypertension: Secondary | ICD-10-CM | POA: Diagnosis not present

## 2016-10-30 DIAGNOSIS — I4891 Unspecified atrial fibrillation: Secondary | ICD-10-CM | POA: Diagnosis not present

## 2016-10-30 DIAGNOSIS — G5602 Carpal tunnel syndrome, left upper limb: Secondary | ICD-10-CM | POA: Diagnosis not present

## 2016-11-01 ENCOUNTER — Ambulatory Visit (INDEPENDENT_AMBULATORY_CARE_PROVIDER_SITE_OTHER): Payer: Medicare Other | Admitting: *Deleted

## 2016-11-01 DIAGNOSIS — I6789 Other cerebrovascular disease: Secondary | ICD-10-CM

## 2016-11-01 DIAGNOSIS — I10 Essential (primary) hypertension: Secondary | ICD-10-CM | POA: Diagnosis not present

## 2016-11-01 DIAGNOSIS — H409 Unspecified glaucoma: Secondary | ICD-10-CM | POA: Diagnosis not present

## 2016-11-01 DIAGNOSIS — I4891 Unspecified atrial fibrillation: Secondary | ICD-10-CM | POA: Diagnosis not present

## 2016-11-01 DIAGNOSIS — Z5181 Encounter for therapeutic drug level monitoring: Secondary | ICD-10-CM

## 2016-11-01 DIAGNOSIS — G5602 Carpal tunnel syndrome, left upper limb: Secondary | ICD-10-CM | POA: Diagnosis not present

## 2016-11-01 DIAGNOSIS — G5601 Carpal tunnel syndrome, right upper limb: Secondary | ICD-10-CM | POA: Diagnosis not present

## 2016-11-01 DIAGNOSIS — M25562 Pain in left knee: Secondary | ICD-10-CM | POA: Diagnosis not present

## 2016-11-01 LAB — POCT INR: INR: 3.8

## 2016-11-02 DIAGNOSIS — G5601 Carpal tunnel syndrome, right upper limb: Secondary | ICD-10-CM | POA: Diagnosis not present

## 2016-11-02 DIAGNOSIS — G5602 Carpal tunnel syndrome, left upper limb: Secondary | ICD-10-CM | POA: Diagnosis not present

## 2016-11-02 DIAGNOSIS — T8484XA Pain due to internal orthopedic prosthetic devices, implants and grafts, initial encounter: Secondary | ICD-10-CM | POA: Diagnosis not present

## 2016-11-02 DIAGNOSIS — I10 Essential (primary) hypertension: Secondary | ICD-10-CM | POA: Diagnosis not present

## 2016-11-02 DIAGNOSIS — H409 Unspecified glaucoma: Secondary | ICD-10-CM | POA: Diagnosis not present

## 2016-11-02 DIAGNOSIS — M25562 Pain in left knee: Secondary | ICD-10-CM | POA: Diagnosis not present

## 2016-11-02 DIAGNOSIS — I4891 Unspecified atrial fibrillation: Secondary | ICD-10-CM | POA: Diagnosis not present

## 2016-11-02 DIAGNOSIS — Z96652 Presence of left artificial knee joint: Secondary | ICD-10-CM | POA: Diagnosis not present

## 2016-11-05 DIAGNOSIS — I4891 Unspecified atrial fibrillation: Secondary | ICD-10-CM | POA: Diagnosis not present

## 2016-11-05 DIAGNOSIS — M25562 Pain in left knee: Secondary | ICD-10-CM | POA: Diagnosis not present

## 2016-11-05 DIAGNOSIS — G5602 Carpal tunnel syndrome, left upper limb: Secondary | ICD-10-CM | POA: Diagnosis not present

## 2016-11-05 DIAGNOSIS — I10 Essential (primary) hypertension: Secondary | ICD-10-CM | POA: Diagnosis not present

## 2016-11-05 DIAGNOSIS — H409 Unspecified glaucoma: Secondary | ICD-10-CM | POA: Diagnosis not present

## 2016-11-05 DIAGNOSIS — G5601 Carpal tunnel syndrome, right upper limb: Secondary | ICD-10-CM | POA: Diagnosis not present

## 2016-11-06 DIAGNOSIS — I10 Essential (primary) hypertension: Secondary | ICD-10-CM | POA: Diagnosis not present

## 2016-11-06 DIAGNOSIS — M25562 Pain in left knee: Secondary | ICD-10-CM | POA: Diagnosis not present

## 2016-11-06 DIAGNOSIS — H409 Unspecified glaucoma: Secondary | ICD-10-CM | POA: Diagnosis not present

## 2016-11-06 DIAGNOSIS — G5602 Carpal tunnel syndrome, left upper limb: Secondary | ICD-10-CM | POA: Diagnosis not present

## 2016-11-06 DIAGNOSIS — I4891 Unspecified atrial fibrillation: Secondary | ICD-10-CM | POA: Diagnosis not present

## 2016-11-06 DIAGNOSIS — G5601 Carpal tunnel syndrome, right upper limb: Secondary | ICD-10-CM | POA: Diagnosis not present

## 2016-11-08 DIAGNOSIS — M25562 Pain in left knee: Secondary | ICD-10-CM | POA: Diagnosis not present

## 2016-11-08 DIAGNOSIS — G5602 Carpal tunnel syndrome, left upper limb: Secondary | ICD-10-CM | POA: Diagnosis not present

## 2016-11-08 DIAGNOSIS — H409 Unspecified glaucoma: Secondary | ICD-10-CM | POA: Diagnosis not present

## 2016-11-08 DIAGNOSIS — I4891 Unspecified atrial fibrillation: Secondary | ICD-10-CM | POA: Diagnosis not present

## 2016-11-08 DIAGNOSIS — I10 Essential (primary) hypertension: Secondary | ICD-10-CM | POA: Diagnosis not present

## 2016-11-08 DIAGNOSIS — G5601 Carpal tunnel syndrome, right upper limb: Secondary | ICD-10-CM | POA: Diagnosis not present

## 2016-11-12 ENCOUNTER — Ambulatory Visit: Payer: Medicare Other

## 2016-11-19 ENCOUNTER — Ambulatory Visit
Admission: RE | Admit: 2016-11-19 | Discharge: 2016-11-19 | Disposition: A | Discharge status: Home / Self Care | Payer: Medicare Other | Source: Ambulatory Visit | Attending: Family Medicine | Admitting: Family Medicine

## 2016-11-19 ENCOUNTER — Ambulatory Visit (INDEPENDENT_AMBULATORY_CARE_PROVIDER_SITE_OTHER): Payer: Medicare Other | Admitting: Pharmacist

## 2016-11-19 DIAGNOSIS — Z5181 Encounter for therapeutic drug level monitoring: Secondary | ICD-10-CM

## 2016-11-19 DIAGNOSIS — I6789 Other cerebrovascular disease: Secondary | ICD-10-CM | POA: Diagnosis not present

## 2016-11-19 DIAGNOSIS — Z1231 Encounter for screening mammogram for malignant neoplasm of breast: Secondary | ICD-10-CM

## 2016-11-19 HISTORY — DX: Personal history of irradiation: Z92.3

## 2016-11-19 LAB — POCT INR: INR: 1.3

## 2016-11-28 ENCOUNTER — Ambulatory Visit (INDEPENDENT_AMBULATORY_CARE_PROVIDER_SITE_OTHER): Payer: Medicare Other | Admitting: *Deleted

## 2016-11-28 DIAGNOSIS — I6789 Other cerebrovascular disease: Secondary | ICD-10-CM | POA: Diagnosis not present

## 2016-11-28 DIAGNOSIS — Z5181 Encounter for therapeutic drug level monitoring: Secondary | ICD-10-CM

## 2016-11-28 LAB — POCT INR: INR: 1.9

## 2016-12-12 ENCOUNTER — Ambulatory Visit (INDEPENDENT_AMBULATORY_CARE_PROVIDER_SITE_OTHER): Payer: Medicare Other | Admitting: *Deleted

## 2016-12-12 DIAGNOSIS — Z5181 Encounter for therapeutic drug level monitoring: Secondary | ICD-10-CM | POA: Diagnosis not present

## 2016-12-12 DIAGNOSIS — I6789 Other cerebrovascular disease: Secondary | ICD-10-CM | POA: Diagnosis not present

## 2016-12-12 LAB — POCT INR: INR: 1.8

## 2016-12-26 ENCOUNTER — Ambulatory Visit (INDEPENDENT_AMBULATORY_CARE_PROVIDER_SITE_OTHER): Payer: Medicare Other | Admitting: Pharmacist

## 2016-12-26 DIAGNOSIS — Z5181 Encounter for therapeutic drug level monitoring: Secondary | ICD-10-CM | POA: Diagnosis not present

## 2016-12-26 DIAGNOSIS — I6789 Other cerebrovascular disease: Secondary | ICD-10-CM | POA: Diagnosis not present

## 2016-12-26 LAB — POCT INR: INR: 1.2

## 2017-01-16 ENCOUNTER — Ambulatory Visit (INDEPENDENT_AMBULATORY_CARE_PROVIDER_SITE_OTHER): Payer: Medicare Other | Admitting: Pharmacist

## 2017-01-16 DIAGNOSIS — I6789 Other cerebrovascular disease: Secondary | ICD-10-CM | POA: Diagnosis not present

## 2017-01-16 DIAGNOSIS — Z5181 Encounter for therapeutic drug level monitoring: Secondary | ICD-10-CM

## 2017-01-16 LAB — POCT INR: INR: 2.3

## 2017-01-21 ENCOUNTER — Ambulatory Visit: Payer: Medicare Other | Admitting: Interventional Cardiology

## 2017-01-23 ENCOUNTER — Encounter: Payer: Self-pay | Admitting: Interventional Cardiology

## 2017-02-06 ENCOUNTER — Ambulatory Visit (INDEPENDENT_AMBULATORY_CARE_PROVIDER_SITE_OTHER): Payer: Medicare Other | Admitting: *Deleted

## 2017-02-06 DIAGNOSIS — E559 Vitamin D deficiency, unspecified: Secondary | ICD-10-CM | POA: Diagnosis not present

## 2017-02-06 DIAGNOSIS — Z5181 Encounter for therapeutic drug level monitoring: Secondary | ICD-10-CM | POA: Diagnosis not present

## 2017-02-06 DIAGNOSIS — R8290 Unspecified abnormal findings in urine: Secondary | ICD-10-CM | POA: Diagnosis not present

## 2017-02-06 DIAGNOSIS — F321 Major depressive disorder, single episode, moderate: Secondary | ICD-10-CM | POA: Diagnosis not present

## 2017-02-06 DIAGNOSIS — G2581 Restless legs syndrome: Secondary | ICD-10-CM | POA: Diagnosis not present

## 2017-02-06 DIAGNOSIS — I6789 Other cerebrovascular disease: Secondary | ICD-10-CM

## 2017-02-06 DIAGNOSIS — R609 Edema, unspecified: Secondary | ICD-10-CM | POA: Diagnosis not present

## 2017-02-06 DIAGNOSIS — I10 Essential (primary) hypertension: Secondary | ICD-10-CM | POA: Diagnosis not present

## 2017-02-06 DIAGNOSIS — E7800 Pure hypercholesterolemia, unspecified: Secondary | ICD-10-CM | POA: Diagnosis not present

## 2017-02-06 LAB — PROTIME-INR
INR: 9.9 (ref 0.8–1.2)
Prothrombin Time: 91.8 s — ABNORMAL HIGH (ref 9.1–12.0)

## 2017-02-06 LAB — POCT INR: INR: >8

## 2017-02-11 ENCOUNTER — Ambulatory Visit (INDEPENDENT_AMBULATORY_CARE_PROVIDER_SITE_OTHER): Payer: Medicare Other | Admitting: Pharmacist

## 2017-02-11 DIAGNOSIS — Z5181 Encounter for therapeutic drug level monitoring: Secondary | ICD-10-CM

## 2017-02-11 DIAGNOSIS — I6789 Other cerebrovascular disease: Secondary | ICD-10-CM

## 2017-02-11 LAB — POCT INR: INR: 1.4

## 2017-02-20 DIAGNOSIS — R609 Edema, unspecified: Secondary | ICD-10-CM | POA: Diagnosis not present

## 2017-02-20 DIAGNOSIS — N3000 Acute cystitis without hematuria: Secondary | ICD-10-CM | POA: Diagnosis not present

## 2017-03-05 ENCOUNTER — Ambulatory Visit (INDEPENDENT_AMBULATORY_CARE_PROVIDER_SITE_OTHER): Payer: Medicare Other | Admitting: Interventional Cardiology

## 2017-03-05 ENCOUNTER — Ambulatory Visit (INDEPENDENT_AMBULATORY_CARE_PROVIDER_SITE_OTHER): Payer: Medicare Other | Admitting: *Deleted

## 2017-03-05 ENCOUNTER — Encounter: Payer: Self-pay | Admitting: Interventional Cardiology

## 2017-03-05 VITALS — BP 124/70 | HR 80 | Ht 63.0 in | Wt 143.8 lb

## 2017-03-05 DIAGNOSIS — I10 Essential (primary) hypertension: Secondary | ICD-10-CM | POA: Diagnosis not present

## 2017-03-05 DIAGNOSIS — Z7901 Long term (current) use of anticoagulants: Secondary | ICD-10-CM | POA: Diagnosis not present

## 2017-03-05 DIAGNOSIS — Z5181 Encounter for therapeutic drug level monitoring: Secondary | ICD-10-CM

## 2017-03-05 DIAGNOSIS — R600 Localized edema: Secondary | ICD-10-CM

## 2017-03-05 DIAGNOSIS — E782 Mixed hyperlipidemia: Secondary | ICD-10-CM | POA: Diagnosis not present

## 2017-03-05 DIAGNOSIS — I6789 Other cerebrovascular disease: Secondary | ICD-10-CM

## 2017-03-05 LAB — POCT INR: INR: 1.5

## 2017-03-05 MED ORDER — FUROSEMIDE 40 MG PO TABS: 40.0000 mg | ORAL_TABLET | Freq: Every day | ORAL | 3 refills | Status: DC | PRN

## 2017-03-05 NOTE — Progress Notes (Signed)
Cardiology Office Note   Date:  03/05/2017   ID:  Renee Adams, DOB [birth year], MRN [id number]  PCP:  Renee Graff.[name], MD    No chief complaint on file. Leg edema, anticoagulated   Wt Readings from Last 3 Encounters:  03/05/17 143 lb 12.8 oz (65.2 kg)  10/28/16 145 lb (65.8 kg)  10/11/16 147 lb 9.6 oz (67 kg)       History of Present Illness: Renee Adams is a 83 y.o. female  Who has osteoarthritis.  She has had both hips and knees replaced.  She now has arthritis in her hands.    She has had prior CVA and atrial septal aneurysm, possible PFO was detected at that time in 2004.  She was started on Coumadin in 2004.  SHe has not had AFib recently.   She has had worsening leg swelling.  She tried Lasix but this did not help.  Amlodipine was decreased but swelling persisted.  She does not elevate her legs.    Past Medical History:  Diagnosis Date  . ASD (atrial septal defect)   . Cancer [facility])    breast, left  . Carpal tunnel syndrome, bilateral   . Depression   . Gait disorder   . GERD (gastroesophageal reflux disease)   . Glaucoma   . Glaucoma   . History of stroke   . Hypertension   . IBS (irritable bowel syndrome)   . Personal history of radiation therapy 1996  . S/P bunionectomy    bilateral    Past Surgical History:  Procedure Laterality Date  . ABDOMINAL HYSTERECTOMY    . bladder resuspension    . BREAST LUMPECTOMY Left 1996  . CATARACT EXTRACTION Bilateral   . INCISION AND DRAINAGE HIP  03/31/2012   Procedure: IRRIGATION AND DEBRIDEMENT HIP WITH POLY EXCHANGE;  Surgeon: Rudean Haskell, MD;  Location: [place];  Service: Orthopedics;  Laterality: Left;  . ORIF FEMUR FRACTURE Right 11/30/2012   Procedure: OPEN REDUCTION INTERNAL FIXATION (ORIF) DISTAL FEMUR FRACTURE;  Surgeon: Vickey Huger, MD;  Location: [place];  Service: Orthopedics;  Laterality: Right;  . TOTAL HIP ARTHROPLASTY Bilateral   . TOTAL KNEE ARTHROPLASTY Bilateral      Current  Outpatient Prescriptions  Medication Sig Dispense Refill  . amLODipine (NORVASC) 5 MG tablet Take 2.5 mg by mouth daily.    Marland Kitchen ALPRAZolam (XANAX) 0.25 MG tablet Take 4 tablets (1 mg total) by mouth every 4 (four) hours as needed for anxiety. 40 tablet 0  . atorvastatin (LIPITOR) 10 MG tablet Take 1 tablet (10 mg total) by mouth daily. 30 tablet 4  . buPROPion (WELLBUTRIN XL) 300 MG 24 hr tablet Take 1 tablet (300 mg total) by mouth daily. 30 tablet 1  . calcium carbonate 200 MG capsule Take 3 capsules (600 mg total) by mouth 2 (two) times daily with a meal. 60 capsule 1  . Cholecalciferol (VITAMIN D-1000 MAX ST) 1000 units tablet Take by mouth.    . diclofenac sodium (VOLTAREN) 1 % GEL Apply 2 g topically 4 (four) times daily.    Marland Kitchen EPINEPHrine 0.3 mg/0.3 mL IJ SOAJ injection Inject into the muscle.    . ergocalciferol (VITAMIN D2) 50000 UNITS capsule Take 50,000 Units by mouth once a week.    . furosemide (LASIX) 20 MG tablet Take 20 mg by mouth daily.  0  . latanoprost (XALATAN) 0.005 % ophthalmic solution Place 1 drop into both eyes at bedtime. 2.5 mL 12  .  oxyCODONE (ROXICODONE) 5 MG immediate release tablet Take 0.5 tablets (2.5 mg total) by mouth every 4 (four) hours as needed for severe pain. 7 tablet 0  . rOPINIRole (REQUIP) 1 MG tablet Take 1 tablet (1 mg total) by mouth at bedtime as needed. For restless legs 30 tablet 0  . sertraline (ZOLOFT) 100 MG tablet Take 1.5 tablets (150 mg total) by mouth daily. 30 tablet 1  . warfarin (COUMADIN) 2 MG tablet TAKE 1 TABLET BY MOUTH (2 MG TOTAL) AS DIRECTED. 45 tablet 3   No current facility-administered medications for this visit.     Allergies:   Dilaudid [hydromorphone hcl]; Hydromorphone; Morphine; Morphine and related; Sulfamethoxazole; Sulfa antibiotics; Keflex [cephalexin]; and Levaquin [levofloxacin]    Social History:  The patient  reports that she has never smoked. She has never used smokeless tobacco. She reports that she does not  drink alcohol or use drugs.   Family History:  The patient's family history includes Breast cancer in her maternal aunt and mother; Cancer in her mother; Diabetes in her sister; Heart failure in her father; Osteoarthritis in her brother.    ROS:  Please see the history of present illness.   Otherwise, review of systems are positive for leg swelling.   All other systems are reviewed and negative.    PHYSICAL EXAM: VS:  BP 124/70   Pulse 80   Ht 5\' 3"  (1.6 m)   Wt 143 lb 12.8 oz (65.2 kg)   SpO2 95%   BMI 25.47 kg/m  , BMI Body mass index is 25.47 kg/m. GEN: Well nourished, well developed, in no acute distress  HEENT: normal  Neck: no JVD, carotid bruits, or masses Cardiac: RRR; no murmurs, rubs, or gallops,; 1+ bilateral lower extremity  edema  Respiratory:  clear to auscultation bilaterally, normal work of breathing GI: soft, nontender, nondistended, + BS MS: no deformity or atrophy  Skin: warm and dry, skin changes associated with venous insufficiency Neuro:  Strength and sensation are intact Psych: euthymic mood, full affect   EKG:   The ekg ordered today demonstrates NSR, LAD, NSST   Recent Labs: 10/11/2016: ALT 10 10/28/2016: BUN 18; Creatinine, Ser 0.99; Hemoglobin 14.2; Platelets 168; Potassium 4.0; Sodium 138   Lipid Panel    Component Value Date/Time   CHOL 202 (H) 01/11/2014 1022   TRIG 156.0 (H) 01/11/2014 1022   HDL 49.70 01/11/2014 1022   CHOLHDL 4 01/11/2014 1022   VLDL 31.2 01/11/2014 1022   LDLCALC 121 (H) 01/11/2014 1022     Other studies Reviewed: Additional studies/ records that were reviewed today with results demonstrating: prior echo results from 2004 reviewed.  Bubble study done in 2004.   ASSESSMENT AND PLAN:  1.  prior CVA- possible PFO from prior echo:  Coumadin for stroke prevention. Tolerating anticoagulation. 2. Hyperlipidemia: LDL 112, continue atorvastatin. 3. HTN: BP controlled.  COntinue current meds. 4. LE edema: Arthritis makes  it difficult for her to use compression stockings.  She should elevate legs above her heart. Lasix 40 mg called in to use as needed when swelling worsens.  SHe did not get much benefit from daily Lasix 20 mg daily.   Current medicines are reviewed at length with the patient today.  The patient concerns regarding her medicines were addressed.  The following changes have been made:  No change  Labs/ tests ordered today include:  No orders of the defined types were placed in this encounter.   Recommend 150 minutes/week of aerobic exercise  Low fat, low carb, high fiber diet recommended  Disposition:   FU in 1 year   Signed, Larae Grooms, MD  03/05/2017 4:04 PM    [facility] [place], [place], [place]  [address] Phone: [phone]; Fax: [phone]

## 2017-03-05 NOTE — Patient Instructions (Addendum)
Medication Instructions:  TAKE furosemide (lasix) 40 mg daily AS NEEDED for lower extremity swelling   Labwork: None ordered  Testing/Procedures: None ordered  Follow-Up: Your physician wants you to follow-up in: 1 year with Dr. Irish Lack. You will receive a reminder letter in the mail two months in advance. If you don't receive a letter, please call our office to schedule the follow-up appointment.   Any Other Special Instructions Will Be Listed Below (If Applicable).     If you need a refill on your cardiac medications before your next appointment, please call your pharmacy.

## 2017-03-18 ENCOUNTER — Ambulatory Visit (INDEPENDENT_AMBULATORY_CARE_PROVIDER_SITE_OTHER): Payer: Medicare Other | Admitting: *Deleted

## 2017-03-18 DIAGNOSIS — I6789 Other cerebrovascular disease: Secondary | ICD-10-CM

## 2017-03-18 DIAGNOSIS — Z5181 Encounter for therapeutic drug level monitoring: Secondary | ICD-10-CM | POA: Diagnosis not present

## 2017-03-18 DIAGNOSIS — I480 Paroxysmal atrial fibrillation: Secondary | ICD-10-CM | POA: Diagnosis not present

## 2017-03-18 LAB — POCT INR: INR: 4.1

## 2017-04-04 ENCOUNTER — Ambulatory Visit: Payer: Medicare Other | Admitting: Interventional Cardiology

## 2017-04-09 ENCOUNTER — Ambulatory Visit (INDEPENDENT_AMBULATORY_CARE_PROVIDER_SITE_OTHER): Payer: Medicare Other | Admitting: *Deleted

## 2017-04-09 DIAGNOSIS — I6789 Other cerebrovascular disease: Secondary | ICD-10-CM | POA: Diagnosis not present

## 2017-04-09 DIAGNOSIS — I480 Paroxysmal atrial fibrillation: Secondary | ICD-10-CM | POA: Diagnosis not present

## 2017-04-09 DIAGNOSIS — Z5181 Encounter for therapeutic drug level monitoring: Secondary | ICD-10-CM

## 2017-04-09 LAB — POCT INR: INR: 2.3

## 2017-05-07 ENCOUNTER — Ambulatory Visit (INDEPENDENT_AMBULATORY_CARE_PROVIDER_SITE_OTHER): Payer: Medicare Other

## 2017-05-07 DIAGNOSIS — Z5181 Encounter for therapeutic drug level monitoring: Secondary | ICD-10-CM | POA: Diagnosis not present

## 2017-05-07 DIAGNOSIS — I6789 Other cerebrovascular disease: Secondary | ICD-10-CM | POA: Diagnosis not present

## 2017-05-07 LAB — POCT INR: INR: 4.2

## 2017-05-13 ENCOUNTER — Other Ambulatory Visit: Payer: Self-pay | Admitting: Interventional Cardiology

## 2017-05-13 DIAGNOSIS — E782 Mixed hyperlipidemia: Secondary | ICD-10-CM

## 2017-05-13 DIAGNOSIS — I10 Essential (primary) hypertension: Secondary | ICD-10-CM

## 2017-05-13 DIAGNOSIS — Q211 Atrial septal defect, unspecified: Secondary | ICD-10-CM

## 2017-06-07 ENCOUNTER — Ambulatory Visit (INDEPENDENT_AMBULATORY_CARE_PROVIDER_SITE_OTHER): Payer: Medicare Other | Admitting: *Deleted

## 2017-06-07 DIAGNOSIS — Z5181 Encounter for therapeutic drug level monitoring: Secondary | ICD-10-CM | POA: Diagnosis not present

## 2017-06-07 DIAGNOSIS — I6789 Other cerebrovascular disease: Secondary | ICD-10-CM | POA: Diagnosis not present

## 2017-06-07 LAB — POCT INR: INR: 1.3

## 2017-06-07 NOTE — Patient Instructions (Signed)
Today take 2 tablets and tomorrow take 1.5 tablets then continue taking 1 tablet daily except 1.5 tablets on Mondays, Wednesdays, and Fridays.  Recheck in 1 week.  Please call Coumadin Clinic# [phone] Main # [phone]. with any problems new medications or if scheduled for any procedures

## 2017-06-14 ENCOUNTER — Ambulatory Visit (INDEPENDENT_AMBULATORY_CARE_PROVIDER_SITE_OTHER): Payer: Medicare Other | Admitting: Pharmacist

## 2017-06-14 DIAGNOSIS — Z5181 Encounter for therapeutic drug level monitoring: Secondary | ICD-10-CM | POA: Diagnosis not present

## 2017-06-14 DIAGNOSIS — I6789 Other cerebrovascular disease: Secondary | ICD-10-CM | POA: Diagnosis not present

## 2017-06-14 LAB — POCT INR: INR: 3.8

## 2017-06-14 NOTE — Patient Instructions (Signed)
Skip your Coumadin today, then continue taking 1 tablet daily except 1.5 tablets on Mondays, Wednesdays, and Fridays.  Recheck in 10 days.  Please call Coumadin Clinic# [phone] Main # [phone]. with any problems new medications or if scheduled for any procedures

## 2017-06-26 ENCOUNTER — Ambulatory Visit (INDEPENDENT_AMBULATORY_CARE_PROVIDER_SITE_OTHER): Payer: Medicare Other | Admitting: Pharmacist

## 2017-06-26 DIAGNOSIS — I6789 Other cerebrovascular disease: Secondary | ICD-10-CM

## 2017-06-26 DIAGNOSIS — Z5181 Encounter for therapeutic drug level monitoring: Secondary | ICD-10-CM | POA: Diagnosis not present

## 2017-06-26 LAB — POCT INR: INR: 5.3

## 2017-06-26 NOTE — Patient Instructions (Signed)
Description   Skip your Coumadin today and tomorrow, take only 0.5 tablets on Friday then start taking 1 tablet daily except 1.5 tablets on Mondays, and Fridays.  Recheck soonest patient can come Jan 2nd.  Please call Coumadin Clinic# [phone] Main # [phone]. with any problems new medications or if scheduled for any procedures

## 2017-07-10 ENCOUNTER — Ambulatory Visit (INDEPENDENT_AMBULATORY_CARE_PROVIDER_SITE_OTHER): Payer: Medicare Other

## 2017-07-10 DIAGNOSIS — Z5181 Encounter for therapeutic drug level monitoring: Secondary | ICD-10-CM | POA: Diagnosis not present

## 2017-07-10 DIAGNOSIS — I6789 Other cerebrovascular disease: Secondary | ICD-10-CM

## 2017-07-10 LAB — POCT INR: INR: 2.4

## 2017-07-10 NOTE — Patient Instructions (Signed)
Continue on same dosage 1 tablet daily except 1.5 tablets on Mondays and Fridays.  Please call Coumadin Clinic# [phone] Main # [phone] with any problems new medications or if scheduled for any procedures. Recheck in 3 weeks

## 2017-07-31 ENCOUNTER — Ambulatory Visit (INDEPENDENT_AMBULATORY_CARE_PROVIDER_SITE_OTHER): Payer: Medicare Other | Admitting: *Deleted

## 2017-07-31 DIAGNOSIS — I6789 Other cerebrovascular disease: Secondary | ICD-10-CM | POA: Diagnosis not present

## 2017-07-31 DIAGNOSIS — Z5181 Encounter for therapeutic drug level monitoring: Secondary | ICD-10-CM

## 2017-07-31 LAB — POCT INR: INR: 1.7

## 2017-07-31 NOTE — Patient Instructions (Signed)
Description   Today take 1.5 tablets, then Continue on same dosage 1 tablet daily except 1.5 tablets on Mondays and Fridays.  Please call Coumadin Clinic# [phone] Main # [phone] with any problems new medications or if scheduled for any procedures. Recheck in 2 weeks

## 2017-08-21 ENCOUNTER — Ambulatory Visit (INDEPENDENT_AMBULATORY_CARE_PROVIDER_SITE_OTHER): Payer: Medicare Other | Admitting: Pharmacist

## 2017-08-21 DIAGNOSIS — Z5181 Encounter for therapeutic drug level monitoring: Secondary | ICD-10-CM | POA: Diagnosis not present

## 2017-08-21 DIAGNOSIS — I6789 Other cerebrovascular disease: Secondary | ICD-10-CM

## 2017-08-21 LAB — POCT INR: INR: 4

## 2017-08-21 NOTE — Patient Instructions (Signed)
Description   No Coumadin today then Continue on same dosage 1 tablet daily except 1.5 tablets on Mondays and Fridays.  Please call Coumadin Clinic# [phone] Main # [phone] with any problems new medications or if scheduled for any procedures. Recheck in 2 weeks

## 2017-09-04 ENCOUNTER — Ambulatory Visit (INDEPENDENT_AMBULATORY_CARE_PROVIDER_SITE_OTHER): Payer: Medicare Other | Admitting: *Deleted

## 2017-09-04 DIAGNOSIS — Z5181 Encounter for therapeutic drug level monitoring: Secondary | ICD-10-CM

## 2017-09-04 DIAGNOSIS — I6789 Other cerebrovascular disease: Secondary | ICD-10-CM | POA: Diagnosis not present

## 2017-09-04 LAB — POCT INR: INR: 1.9

## 2017-09-04 NOTE — Patient Instructions (Addendum)
Description   Today take 1.5 tablets then continue taking 1 tablet daily except 1.5 tablets on Mondays and Fridays.  Please call Coumadin Clinic# [phone] Main # [phone] with any problems new medications or if scheduled for any procedures. Recheck in 3 weeks per pt request.

## 2017-09-28 ENCOUNTER — Encounter (HOSPITAL_COMMUNITY): Payer: Self-pay | Admitting: Emergency Medicine

## 2017-09-28 ENCOUNTER — Emergency Department (HOSPITAL_COMMUNITY)
Admission: EM | Admit: 2017-09-28 | Discharge: 2017-09-28 | Disposition: A | Discharge status: Home / Self Care | Payer: Medicare Other | Attending: Emergency Medicine | Admitting: Emergency Medicine

## 2017-09-28 DIAGNOSIS — Z96653 Presence of artificial knee joint, bilateral: Secondary | ICD-10-CM | POA: Insufficient documentation

## 2017-09-28 DIAGNOSIS — R0902 Hypoxemia: Secondary | ICD-10-CM | POA: Diagnosis not present

## 2017-09-28 DIAGNOSIS — K625 Hemorrhage of anus and rectum: Secondary | ICD-10-CM | POA: Diagnosis not present

## 2017-09-28 DIAGNOSIS — I10 Essential (primary) hypertension: Secondary | ICD-10-CM | POA: Insufficient documentation

## 2017-09-28 DIAGNOSIS — K623 Rectal prolapse: Secondary | ICD-10-CM | POA: Diagnosis not present

## 2017-09-28 DIAGNOSIS — Z79899 Other long term (current) drug therapy: Secondary | ICD-10-CM | POA: Insufficient documentation

## 2017-09-28 DIAGNOSIS — Z7901 Long term (current) use of anticoagulants: Secondary | ICD-10-CM | POA: Insufficient documentation

## 2017-09-28 DIAGNOSIS — Z96643 Presence of artificial hip joint, bilateral: Secondary | ICD-10-CM | POA: Diagnosis not present

## 2017-09-28 DIAGNOSIS — D0592 Unspecified type of carcinoma in situ of left breast: Secondary | ICD-10-CM | POA: Insufficient documentation

## 2017-09-28 LAB — CBC WITH DIFFERENTIAL/PLATELET
BASOS PCT: 1 %
Basophils Absolute: 0.1 10*3/uL (ref 0.0–0.1)
EOS ABS: 0.2 10*3/uL (ref 0.0–0.7)
EOS PCT: 2 %
HCT: 39.7 % (ref 36.0–46.0)
HEMOGLOBIN: 12.7 g/dL (ref 12.0–15.0)
Lymphocytes Relative: 15 %
Lymphs Abs: 1.2 10*3/uL (ref 0.7–4.0)
MCH: 28.7 pg (ref 26.0–34.0)
MCHC: 32 g/dL (ref 30.0–36.0)
MCV: 89.6 fL (ref 78.0–100.0)
Monocytes Absolute: 0.5 10*3/uL (ref 0.1–1.0)
Monocytes Relative: 6 %
NEUTROS PCT: 76 %
Neutro Abs: 6.3 10*3/uL (ref 1.7–7.7)
PLATELETS: 193 10*3/uL (ref 150–400)
RBC: 4.43 MIL/uL (ref 3.87–5.11)
RDW: 14.8 % (ref 11.5–15.5)
WBC: 8.2 10*3/uL (ref 4.0–10.5)

## 2017-09-28 LAB — COMPREHENSIVE METABOLIC PANEL
ALBUMIN: 3.7 g/dL (ref 3.5–5.0)
ALK PHOS: 75 U/L (ref 38–126)
ALT: 13 U/L — ABNORMAL LOW (ref 14–54)
ANION GAP: 9 (ref 5–15)
AST: 22 U/L (ref 15–41)
BUN: 22 mg/dL — ABNORMAL HIGH (ref 6–20)
CHLORIDE: 107 mmol/L (ref 101–111)
CO2: 24 mmol/L (ref 22–32)
Calcium: 9 mg/dL (ref 8.9–10.3)
Creatinine, Ser: 0.74 mg/dL (ref 0.44–1.00)
GFR calc Af Amer: >60 mL/min (ref >60)
GFR calc non Af Amer: >60 mL/min (ref >60)
GLUCOSE: 99 mg/dL (ref 65–99)
POTASSIUM: 4 mmol/L (ref 3.5–5.1)
SODIUM: 140 mmol/L (ref 135–145)
Total Bilirubin: 0.8 mg/dL (ref 0.3–1.2)
Total Protein: 6.6 g/dL (ref 6.5–8.1)

## 2017-09-28 LAB — PROTIME-INR
INR: 1.45
Prothrombin Time: 17.5 seconds — ABNORMAL HIGH (ref 11.4–15.2)

## 2017-09-28 LAB — APTT: APTT: 36 s (ref 24–36)

## 2017-09-28 MED ORDER — DOCUSATE SODIUM 100 MG PO CAPS
100.0000 mg | ORAL_CAPSULE | Freq: Once | ORAL | Status: AC
  Administered 2017-09-28: 100 mg via ORAL
  Filled 2017-09-28: qty 1

## 2017-09-28 MED ORDER — SODIUM CHLORIDE 0.9 % IV SOLN
INTRAVENOUS | Status: DC
  Administered 2017-09-28: 19:00:00 via INTRAVENOUS

## 2017-09-28 NOTE — ED Notes (Signed)
Bed: [facility] Expected date:  Expected time:  Means of arrival:  Comments: 84 yo rectal bleed, prolapsed rectum

## 2017-09-28 NOTE — ED Provider Notes (Signed)
[place] DEPT Provider Note   CSN: [id number] Arrival date & time: 09/28/17  1826     History   Chief Complaint Chief Complaint  Patient presents with  . Rectal Bleeding    HPI Renee Adams is a 84 y.o. female.  84 year old female complains of rectal bleeding times several days.  She is on Coumadin due to history of A. fib.  Has also suffered with rectal prolapse for over a month and has not been seen by Dr. for this.  States that she has had bright red blood per rectum in scant amounts.  Has been able to self reduce her rectal prolapse.  Denies any vaginal bleeding.  No urinary bleeding either.  No weakness or dizziness with standing.  No prior history of GI bleeding.  She denies any constipation.  Last BM was 3 days ago.  EMS gave patient 75 mcg of fentanyl in route for rectal pain.  Called EMS and presents at this time.     Past Medical History:  Diagnosis Date  . ASD (atrial septal defect)   . Cancer [facility])    breast, left  . Carpal tunnel syndrome, bilateral   . Depression   . Gait disorder   . GERD (gastroesophageal reflux disease)   . Glaucoma   . Glaucoma   . History of stroke   . Hypertension   . IBS (irritable bowel syndrome)   . Personal history of radiation therapy 1996  . S/P bunionectomy    bilateral    Patient Active Problem List   Diagnosis Date Noted  . Anticoagulated 03/05/2017  . Malignant neoplasm of overlapping sites of left breast in female, estrogen receptor positive ([place]) 10/11/2016  . Atrial septal defect 01/05/2015  . Hand weakness 05/27/2014  . Mixed hyperlipidemia 01/01/2014  . Atrial fibrillation ([place]) 01/01/2014  . Encounter for therapeutic drug monitoring 08/06/2013  . Acute, but ill-defined, cerebrovascular disease 04/24/2013  . Fracture of femoral neck, right ([place]) 12/03/2012  . Fall 11/26/2012  . Fracture of femoral shaft, right, closed ([place]) 11/26/2012  . UTI (urinary tract infection)  11/26/2012  . Essential hypertension, benign 08/18/2012  . Status post left hip replacement 04/18/2012  . Osteomyelitis of left hip ([place]) 03/31/2012  . Breast cancer, left breast ([place]) 09/12/2011    Past Surgical History:  Procedure Laterality Date  . ABDOMINAL HYSTERECTOMY    . bladder resuspension    . BREAST LUMPECTOMY Left 1996  . CATARACT EXTRACTION Bilateral   . INCISION AND DRAINAGE HIP  03/31/2012   Procedure: IRRIGATION AND DEBRIDEMENT HIP WITH POLY EXCHANGE;  Surgeon: Rudean Haskell, MD;  Location: [place];  Service: Orthopedics;  Laterality: Left;  . ORIF FEMUR FRACTURE Right 11/30/2012   Procedure: OPEN REDUCTION INTERNAL FIXATION (ORIF) DISTAL FEMUR FRACTURE;  Surgeon: Vickey Huger, MD;  Location: [place];  Service: Orthopedics;  Laterality: Right;  . TOTAL HIP ARTHROPLASTY Bilateral   . TOTAL KNEE ARTHROPLASTY Bilateral      OB History   None      Home Medications    Prior to Admission medications   Medication Sig Start Date End Date Taking? Authorizing Provider  ALPRAZolam (XANAX) 0.25 MG tablet Take 4 tablets (1 mg total) by mouth every 4 (four) hours as needed for anxiety. 12/17/12   Angiulli, Lavon Paganini, PA-C  amLODipine (NORVASC) 5 MG tablet Take 2.5 mg by mouth daily.    [provider]  atorvastatin (LIPITOR) 10 MG tablet TAKE 1 TABLET BY MOUTH  DAILY. 05/14/17   Jettie Booze, MD  buPROPion (WELLBUTRIN XL) 300 MG 24 hr tablet Take 1 tablet (300 mg total) by mouth daily. 12/17/12   Angiulli, Lavon Paganini, PA-C  calcium carbonate 200 MG capsule Take 3 capsules (600 mg total) by mouth 2 (two) times daily with a meal. 12/17/12   Angiulli, Lavon Paganini, PA-C  Cholecalciferol (VITAMIN D-1000 MAX ST) 1000 units tablet Take by mouth.    [provider]  diclofenac sodium (VOLTAREN) 1 % GEL Apply 2 g topically 4 (four) times daily.    [provider]  EPINEPHrine 0.3 mg/0.3 mL IJ SOAJ injection Inject into the muscle.    [provider]    ergocalciferol (VITAMIN D2) 50000 UNITS capsule Take 50,000 Units by mouth once a week.    [provider]  furosemide (LASIX) 40 MG tablet Take 1 tablet (40 mg total) by mouth daily as needed. 03/05/17 06/03/17  Jettie Booze, MD  latanoprost (XALATAN) 0.005 % ophthalmic solution Place 1 drop into both eyes at bedtime. 12/17/12   Angiulli, Lavon Paganini, PA-C  oxyCODONE (ROXICODONE) 5 MG immediate release tablet Take 0.5 tablets (2.5 mg total) by mouth every 4 (four) hours as needed for severe pain. 10/28/16   Deno Etienne, DO  rOPINIRole (REQUIP) 1 MG tablet Take 1 tablet (1 mg total) by mouth at bedtime as needed. For restless legs 12/17/12   Angiulli, Lavon Paganini, PA-C  sertraline (ZOLOFT) 100 MG tablet Take 1.5 tablets (150 mg total) by mouth daily. 12/17/12   Angiulli, Lavon Paganini, PA-C  warfarin (COUMADIN) 2 MG tablet TAKE 1 TABLET BY MOUTH AS DIRECTED. 05/14/17   Jettie Booze, MD  pramipexole (MIRAPEX) 0.125 MG tablet Take 0.125 mg by mouth 3 (three) times daily.  09/15/11  [provider]    Family History Family History  Problem Relation Age of Onset  . Cancer Mother   . Breast cancer Mother   . Heart failure Father   . Diabetes Sister   . Osteoarthritis Brother   . Breast cancer Maternal Aunt   . Heart attack Neg Hx     Social History Social History   Tobacco Use  . Smoking status: Never Smoker  . Smokeless tobacco: Never Used  Substance Use Topics  . Alcohol use: No  . Drug use: No     Allergies   Dilaudid [hydromorphone hcl]; Hydromorphone; Morphine; Morphine and related; Sulfamethoxazole; Sulfa antibiotics; Keflex [cephalexin]; and Levaquin [levofloxacin]   Review of Systems Review of Systems  All other systems reviewed and are negative.    Physical Exam Updated Vital Signs Ht 1.613 m (5' 3.5")   Wt 64.9 kg (143 lb)   SpO2 94% Comment: RA  BMI 24.93 kg/m   Physical Exam  Constitutional: She is oriented to person, place, and time. She  appears well-developed and well-nourished.  Non-toxic appearance. No distress.  HENT:  Head: Normocephalic and atraumatic.  Eyes: Pupils are equal, round, and reactive to light. Conjunctivae, EOM and lids are normal.  Neck: Normal range of motion. Neck supple. No tracheal deviation present. No thyroid mass present.  Cardiovascular: Normal rate, regular rhythm and normal heart sounds. Exam reveals no gallop.  No murmur heard. Pulmonary/Chest: Effort normal and breath sounds normal. No stridor. No respiratory distress. She has no decreased breath sounds. She has no wheezes. She has no rhonchi. She has no rales.  Abdominal: Soft. Normal appearance and bowel sounds are normal. She exhibits no distension. There is no tenderness. There  is no rebound and no CVA tenderness.  Genitourinary: Rectal exam shows no external hemorrhoid, no internal hemorrhoid and anal tone normal.  Genitourinary Comments: No gross blood noted on digital exam.  No obvious prolapse noted per rectum.  Musculoskeletal: Normal range of motion. She exhibits no edema or tenderness.  Neurological: She is alert and oriented to person, place, and time. She has normal strength. No cranial nerve deficit or sensory deficit. GCS eye subscore is 4. GCS verbal subscore is 5. GCS motor subscore is 6.  Skin: Skin is warm and dry. No abrasion and no rash noted.  Psychiatric: She has a normal mood and affect. Her speech is normal and behavior is normal.  Nursing note and vitals reviewed.    ED Treatments / Results  Labs (all labs ordered are listed, but only abnormal results are displayed) Labs Reviewed  CBC WITH DIFFERENTIAL/PLATELET  COMPREHENSIVE METABOLIC PANEL  PROTIME-INR  APTT  TYPE AND SCREEN    EKG None  Radiology No results found.  Procedures Procedures (including critical care time)  Medications Ordered in ED Medications  0.9 %  sodium chloride infusion ( Intravenous New Bag/Given 09/28/17 1900)     Initial  Impression / Assessment and Plan / ED Course  I have reviewed the triage vital signs and the nursing notes.  Pertinent labs & imaging results that were available during my care of the patient were reviewed by me and considered in my medical decision making (see chart for details).     Patient without evidence of rectal prolapse at this time.  Her INR is subtherapeutic.  States that she has a history of this.  BUN and creatinine are stable.  Hemoglobin is stable at 12.7.  She is asystematic at this time and denies weakness or being lightheaded.  Will discharge to home she will follow with her doctor next week for Coumadin adjustment.  Return precautions given with regard to GI bleeding.  Final Clinical Impressions(s) / ED Diagnoses   Final diagnoses:  None    ED Discharge Orders    None       Lacretia Leigh, MD 09/28/17 2011

## 2017-09-28 NOTE — ED Triage Notes (Addendum)
Pt from home via EMS c/o rectal bleeding. Pt has hx of prolapsed rectum. Pt received 75 mcg Fentanyl en route. Pt denies fever, N/V, diarrhea. Pt is A&O and in NAD. Pt reports last BM was 3 days ago

## 2017-09-29 LAB — TYPE AND SCREEN
ABO/RH(D): AB POS
Antibody Screen: NEGATIVE

## 2017-10-03 ENCOUNTER — Ambulatory Visit (INDEPENDENT_AMBULATORY_CARE_PROVIDER_SITE_OTHER): Payer: Medicare Other | Admitting: *Deleted

## 2017-10-03 DIAGNOSIS — I6789 Other cerebrovascular disease: Secondary | ICD-10-CM

## 2017-10-03 DIAGNOSIS — Z5181 Encounter for therapeutic drug level monitoring: Secondary | ICD-10-CM | POA: Diagnosis not present

## 2017-10-03 LAB — POCT INR: INR: 1.8

## 2017-10-03 NOTE — Patient Instructions (Signed)
Description   Today March 28th  take 1.5 tablets then continue taking 1 tablet daily except 1.5 tablets on Mondays and Fridays.  Please call Coumadin Clinic# [phone] Main # [phone] with any problems new medications or if scheduled for any procedures. Recheck in 2 weeks per pt request. Reduce intake of dark leafy greens to 1 serving a week

## 2017-10-16 DIAGNOSIS — K623 Rectal prolapse: Secondary | ICD-10-CM | POA: Diagnosis not present

## 2017-10-22 ENCOUNTER — Ambulatory Visit (INDEPENDENT_AMBULATORY_CARE_PROVIDER_SITE_OTHER): Payer: Medicare Other | Admitting: *Deleted

## 2017-10-22 DIAGNOSIS — I6789 Other cerebrovascular disease: Secondary | ICD-10-CM | POA: Diagnosis not present

## 2017-10-22 DIAGNOSIS — Z5181 Encounter for therapeutic drug level monitoring: Secondary | ICD-10-CM

## 2017-10-22 LAB — POCT INR: INR: 1.4

## 2017-10-22 NOTE — Patient Instructions (Signed)
Description   Today take 1.5 tablets, then start taking 1 tablet daily except 1.5 tablets on Mondays, Wednesdays and Fridays.  Please call Coumadin Clinic# [phone] Main # [phone] with any problems new medications or if scheduled for any procedures. Recheck in 10 days.

## 2017-11-01 ENCOUNTER — Ambulatory Visit (INDEPENDENT_AMBULATORY_CARE_PROVIDER_SITE_OTHER): Payer: Medicare Other | Admitting: Pharmacist

## 2017-11-01 DIAGNOSIS — I6789 Other cerebrovascular disease: Secondary | ICD-10-CM | POA: Diagnosis not present

## 2017-11-01 DIAGNOSIS — Z5181 Encounter for therapeutic drug level monitoring: Secondary | ICD-10-CM | POA: Diagnosis not present

## 2017-11-01 LAB — POCT INR: INR: 3.7

## 2017-11-01 NOTE — Patient Instructions (Signed)
Description   NO warfarin (Coumadin) today then continue 1 tablet daily except 1.5 tablets on Mondays, Wednesdays and Fridays.  Please call Coumadin Clinic# [phone] Main # [phone] with any problems new medications or if scheduled for any procedures. Recheck in 10 days.

## 2017-11-11 ENCOUNTER — Other Ambulatory Visit: Payer: Self-pay | Admitting: Family Medicine

## 2017-11-11 DIAGNOSIS — Z1231 Encounter for screening mammogram for malignant neoplasm of breast: Secondary | ICD-10-CM

## 2017-11-12 ENCOUNTER — Ambulatory Visit (INDEPENDENT_AMBULATORY_CARE_PROVIDER_SITE_OTHER): Payer: Medicare Other

## 2017-11-12 DIAGNOSIS — I6789 Other cerebrovascular disease: Secondary | ICD-10-CM

## 2017-11-12 DIAGNOSIS — Z5181 Encounter for therapeutic drug level monitoring: Secondary | ICD-10-CM

## 2017-11-12 LAB — POCT INR: INR: 4.3

## 2017-11-12 NOTE — Patient Instructions (Signed)
Description   Skip today's dosage of Coumadin, then start taking 1 tablet daily except 1.5 tablets on Mondays and Fridays.  Please call Coumadin Clinic# [phone] Main # [phone] with any problems new medications or if scheduled for any procedures. Recheck in 2 weeks.

## 2017-11-26 ENCOUNTER — Ambulatory Visit
Admission: RE | Admit: 2017-11-26 | Discharge: 2017-11-26 | Disposition: A | Discharge status: Home / Self Care | Payer: Medicare Other | Source: Ambulatory Visit | Attending: Family Medicine | Admitting: Family Medicine

## 2017-11-26 ENCOUNTER — Ambulatory Visit (INDEPENDENT_AMBULATORY_CARE_PROVIDER_SITE_OTHER): Payer: Medicare Other | Admitting: *Deleted

## 2017-11-26 DIAGNOSIS — I6789 Other cerebrovascular disease: Secondary | ICD-10-CM

## 2017-11-26 DIAGNOSIS — Z1231 Encounter for screening mammogram for malignant neoplasm of breast: Secondary | ICD-10-CM | POA: Diagnosis not present

## 2017-11-26 DIAGNOSIS — Z5181 Encounter for therapeutic drug level monitoring: Secondary | ICD-10-CM | POA: Diagnosis not present

## 2017-11-26 LAB — POCT INR: INR: 2.8 (ref 2.0–3.0)

## 2017-11-26 NOTE — Patient Instructions (Signed)
Description   Continue taking 1 tablet daily except 1.5 tablets on Mondays and Fridays.  Please call Coumadin Clinic# [phone] Main # [phone] with any problems new medications or if scheduled for any procedures. Recheck in 2 weeks.

## 2017-12-03 ENCOUNTER — Other Ambulatory Visit: Payer: Self-pay | Admitting: Interventional Cardiology

## 2017-12-11 DIAGNOSIS — E559 Vitamin D deficiency, unspecified: Secondary | ICD-10-CM | POA: Diagnosis not present

## 2017-12-11 DIAGNOSIS — E7800 Pure hypercholesterolemia, unspecified: Secondary | ICD-10-CM | POA: Diagnosis not present

## 2017-12-11 DIAGNOSIS — F321 Major depressive disorder, single episode, moderate: Secondary | ICD-10-CM | POA: Diagnosis not present

## 2017-12-11 DIAGNOSIS — R51 Headache: Secondary | ICD-10-CM | POA: Diagnosis not present

## 2017-12-11 DIAGNOSIS — G2581 Restless legs syndrome: Secondary | ICD-10-CM | POA: Diagnosis not present

## 2017-12-11 DIAGNOSIS — I10 Essential (primary) hypertension: Secondary | ICD-10-CM | POA: Diagnosis not present

## 2017-12-18 ENCOUNTER — Ambulatory Visit (INDEPENDENT_AMBULATORY_CARE_PROVIDER_SITE_OTHER): Payer: Medicare Other | Admitting: *Deleted

## 2017-12-18 DIAGNOSIS — I6789 Other cerebrovascular disease: Secondary | ICD-10-CM

## 2017-12-18 DIAGNOSIS — Z5181 Encounter for therapeutic drug level monitoring: Secondary | ICD-10-CM | POA: Diagnosis not present

## 2017-12-18 LAB — POCT INR: INR: 1.6 — AB (ref 2.0–3.0)

## 2017-12-18 NOTE — Patient Instructions (Signed)
Description   Today take 1.5 tablets then continue taking 1 tablet daily except 1.5 tablets on Mondays and Fridays.  Please call Coumadin Clinic# [phone] Main # [phone] with any problems new medications or if scheduled for any procedures. Recheck in 2 weeks.

## 2017-12-30 ENCOUNTER — Other Ambulatory Visit: Payer: Self-pay

## 2017-12-30 DIAGNOSIS — C50012 Malignant neoplasm of nipple and areola, left female breast: Secondary | ICD-10-CM

## 2017-12-30 NOTE — Progress Notes (Signed)
Hematology and Oncology Follow Up Visit  [name] [id number]   [id number]  September 07, 1933 84 y.o. 12/31/2017   PCP:  Alroy Dust, L.[name], MD  CHIEF COMPLAINT: Remote Hx of Left Breast Cancer  CURRENT TREATMENT: observation  BREAST CANCER HISTORY: Renee Adams was originally diagnosed with a left breast carcinoma in early 1996. Tumor was T1, N0, ER and PR positive. She underwent left lumpectomy and sentinel lymph node dissection in March of 1996.  Patient was treated with tamoxifen for 5 years, then Evista for 3 years. The Evsita was then discontinued secondary to history of stroke.   Currently she is being followed with observation alone.  INTERIM HISTORY: [name] returns today for follow-up of her remote breast cancer. She continues under observation. The interval history is generally unremarkable. She denies any breast changes since her last visit. She is still having arthritis in her hands and knees. She is going to follow up with [facility].  Since her last visit, she underwent screening bilateral mammography with CAD and tomography on 11/26/2017 at [facility] showing: breast density category B. There was no evidence of malignancy  REVIEW OF SYSTEMS: Adella is still having arthritis in her hands and knees. She is going to follow up with [facility] in a few weeks. She was assessed for RA under Dr. Gardiner Barefoot, but this was negative.  Instead she was found to have Dupuytren's.  She is planning to go to [facility] orthopedist to see if he would be able to give her a little bit more hand flexibility.  In addition she developed significant lower extremity venous insufficiency which is being treated.  She denies unusual headaches, visual changes, nausea, vomiting, or dizziness. There has been no unusual cough, phlegm production, or pleurisy. This been no change in bowel or bladder habits. She denies unexplained fatigue or unexplained weight loss, bleeding, rash, or fever. A  detailed review of systems was otherwise stable.   Past Medical History:  Diagnosis Date  . ASD (atrial septal defect)   . Cancer [facility])    breast, left  . Carpal tunnel syndrome, bilateral   . Depression   . Gait disorder   . GERD (gastroesophageal reflux disease)   . Glaucoma   . Glaucoma   . History of stroke   . Hypertension   . IBS (irritable bowel syndrome)   . Personal history of radiation therapy 1996  . S/P bunionectomy    bilateral   Past Surgical History:  Procedure Laterality Date  . ABDOMINAL HYSTERECTOMY    . bladder resuspension    . BREAST LUMPECTOMY Left 1996  . CATARACT EXTRACTION Bilateral   . INCISION AND DRAINAGE HIP  03/31/2012   Procedure: IRRIGATION AND DEBRIDEMENT HIP WITH POLY EXCHANGE;  Surgeon: Rudean Haskell, MD;  Location: [place];  Service: Orthopedics;  Laterality: Left;  . ORIF FEMUR FRACTURE Right 11/30/2012   Procedure: OPEN REDUCTION INTERNAL FIXATION (ORIF) DISTAL FEMUR FRACTURE;  Surgeon: Vickey Huger, MD;  Location: [place];  Service: Orthopedics;  Laterality: Right;  . TOTAL HIP ARTHROPLASTY Bilateral   . TOTAL KNEE ARTHROPLASTY Bilateral     Family History  Problem Relation Age of Onset  . Cancer Mother   . Breast cancer Mother   . Heart failure Father   . Diabetes Sister   . Osteoarthritis Brother   . Breast cancer Maternal Aunt   . 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 She  lives alone in her own home with  no pets. The patient's daughter Renee Adams is a Materials engineer and lives in [place]. The patient's son Renee Adams is an Designer, multimedia in [place]. The patient has 4 grandchildren.    ADVANCED DIRECTIVES: She has named both her children as healthcare powers of attorney   Current Outpatient Medications  Medication Sig Dispense Refill  . ALPRAZolam (XANAX) 0.25 MG tablet Take 4 tablets (1 mg total) by mouth every 4 (four) hours as needed for anxiety. 40 tablet 0   . amLODipine (NORVASC) 5 MG tablet Take 2.5 mg by mouth daily.    Marland Kitchen atorvastatin (LIPITOR) 10 MG tablet TAKE 1 TABLET BY MOUTH DAILY. 30 tablet 9  . buPROPion (WELLBUTRIN XL) 300 MG 24 hr tablet Take 1 tablet (300 mg total) by mouth daily. 30 tablet 1  . calcium carbonate 200 MG capsule Take 3 capsules (600 mg total) by mouth 2 (two) times daily with a meal. 60 capsule 1  . Cholecalciferol (VITAMIN D-1000 MAX ST) 1000 units tablet Take by mouth.    . diclofenac sodium (VOLTAREN) 1 % GEL Apply 2 g topically 4 (four) times daily.    Marland Kitchen EPINEPHrine 0.3 mg/0.3 mL IJ SOAJ injection Inject into the muscle.    . ergocalciferol (VITAMIN D2) 50000 UNITS capsule Take 50,000 Units by mouth once a week.    . furosemide (LASIX) 40 MG tablet Take 1 tablet (40 mg total) by mouth daily as needed. 90 tablet 3  . latanoprost (XALATAN) 0.005 % ophthalmic solution Place 1 drop into both eyes at bedtime. 2.5 mL 12  . oxyCODONE (ROXICODONE) 5 MG immediate release tablet Take 0.5 tablets (2.5 mg total) by mouth every 4 (four) hours as needed for severe pain. 7 tablet 0  . rOPINIRole (REQUIP) 1 MG tablet Take 1 tablet (1 mg total) by mouth at bedtime as needed. For restless legs 30 tablet 0  . sertraline (ZOLOFT) 100 MG tablet Take 1.5 tablets (150 mg total) by mouth daily. 30 tablet 1  . warfarin (COUMADIN) 2 MG tablet Take as directed by coumadin clinic 45 tablet 3   No current facility-administered medications for this visit.     Allergies:  Allergies  Allergen Reactions  . Dilaudid [Hydromorphone Hcl] Shortness Of Breath    Tolerates tramadol  . Hydromorphone Other (See Comments)    UNKNOWN TO PATIENT  . Morphine Other (See Comments)    UNKNOWN TO PATIENT  . Morphine And Related Shortness Of Breath    Tolerates tramadol  . Sulfamethoxazole Other (See Comments)    UNKNOWN TO PATIENT  . Sulfa Antibiotics Other (See Comments)    "Made me drunk";  wobbly  . Keflex [Cephalexin] Nausea Only  . Levaquin  [Levofloxacin] Hives and Rash    Physical Exam: Elderly white woman using a walker Vitals:   12/31/17 1312  BP: 140/78  Pulse: 79  Resp: 18  Temp: 98.6 F (37 C)  SpO2: 95%    Body mass index is 25.74 kg/m.  ECOG:  1 Filed Weights   12/31/17 1312  Weight: 147 lb 9.6 oz (67 kg)   Sclerae unicteric Oropharynx clear and moist No cervical or supraclavicular adenopathy Lungs no rales or rhonchi Heart regular rate and rhythm Abd soft, nontender, positive bowel sounds MSK kyphosis, but no focal spinal tenderness, no upper extremity lymphedema Neuro: nonfocal, well oriented, appropriate affect Breasts: The right breast is unremarkable.  The left breast is undergone lumpectomy.  There is no evidence of  local recurrence.  Both axillae are benign.  Lab Results: Lab Results  Component Value Date   WBC 6.0 12/31/2017   HGB 13.3 12/31/2017   HCT 40.2 12/31/2017   MCV 88.4 12/31/2017   PLT 198 12/31/2017   NEUTROABS 3.3 12/31/2017     Chemistry      Component Value Date/Time   NA 140 09/28/2017 1851   NA 142 10/11/2016 1242   K 4.0 09/28/2017 1851   K 4.4 10/11/2016 1242   CL 107 09/28/2017 1851   CL 109 (H) 09/10/2012 1315   CO2 24 09/28/2017 1851   CO2 24 10/11/2016 1242   BUN 22 (H) 09/28/2017 1851   BUN 15.6 10/11/2016 1242   CREATININE 0.74 09/28/2017 1851   CREATININE 0.9 10/11/2016 1242      Component Value Date/Time   CALCIUM 9.0 09/28/2017 1851   CALCIUM 9.8 10/11/2016 1242   ALKPHOS 75 09/28/2017 1851   ALKPHOS 103 10/11/2016 1242   AST 22 09/28/2017 1851   AST 20 10/11/2016 1242   ALT 13 (L) 09/28/2017 1851   ALT 10 10/11/2016 1242   BILITOT 0.8 09/28/2017 1851   BILITOT 0.78 10/11/2016 1242      STUDIES: Since her last visit, she underwent screening bilateral mammography with CAD and tomography on 11/26/2017 at [facility] showing: breast density category B. There was no evidence of malignancy.    Assessment:  84 y.o. [place] woman    (1)  status post left lumpectomy and sentinel lymph node dissection March 1996 for a T1, N0, ER/PR positive breast carcinoma.    (2)  Treated with tamoxifen for five years, then Evista for three years with the Evista discontinued secondary to stroke.    (3)  Currently being followed with observation alone.   Plan:  Aleda is now 23 years out from definitive surgery for breast cancer with no evidence of disease recurrence.  This is very favorable.  Her main issues now really are not her remote breast cancer but chiefly arthritis problems.  She is also a high fall risk although she has had no falls in the past 2 years.  She is very careful to use her walker at all times  She knows I would be happy to release her to her primary care physician, but she feels comfortable coming here once a year after her mammogram and we are glad to accommodate that  She will call with any other issues that may develop before the next visit.   Miro Balderson, Virgie Dad, MD  12/31/17 1:39 PM Medical Oncology and Hematology [facility] [address] [place], [place] [address] Tel. [phone]    Fax. [phone]  Alice Rieger, am acting as scribe for Chauncey Cruel MD.  I, Lurline Del MD, have reviewed the above documentation for accuracy and completeness, and I agree with the above.

## 2017-12-31 ENCOUNTER — Inpatient Hospital Stay: Payer: Medicare Other | Attending: Oncology | Admitting: Oncology

## 2017-12-31 ENCOUNTER — Telehealth: Payer: Self-pay | Admitting: Oncology

## 2017-12-31 ENCOUNTER — Inpatient Hospital Stay: Payer: Medicare Other

## 2017-12-31 VITALS — BP 140/78 | HR 79 | Temp 98.6°F | Resp 18 | Ht 63.5 in | Wt 147.6 lb

## 2017-12-31 DIAGNOSIS — Z803 Family history of malignant neoplasm of breast: Secondary | ICD-10-CM

## 2017-12-31 DIAGNOSIS — Z853 Personal history of malignant neoplasm of breast: Secondary | ICD-10-CM | POA: Insufficient documentation

## 2017-12-31 DIAGNOSIS — C50012 Malignant neoplasm of nipple and areola, left female breast: Secondary | ICD-10-CM

## 2017-12-31 DIAGNOSIS — Z7901 Long term (current) use of anticoagulants: Secondary | ICD-10-CM | POA: Diagnosis not present

## 2017-12-31 DIAGNOSIS — C50812 Malignant neoplasm of overlapping sites of left female breast: Secondary | ICD-10-CM

## 2017-12-31 DIAGNOSIS — Z8673 Personal history of transient ischemic attack (TIA), and cerebral infarction without residual deficits: Secondary | ICD-10-CM

## 2017-12-31 DIAGNOSIS — Z79899 Other long term (current) drug therapy: Secondary | ICD-10-CM | POA: Insufficient documentation

## 2017-12-31 DIAGNOSIS — Z170 Estrogen receptor positive status [ER+]: Secondary | ICD-10-CM

## 2017-12-31 DIAGNOSIS — Z8249 Family history of ischemic heart disease and other diseases of the circulatory system: Secondary | ICD-10-CM | POA: Diagnosis not present

## 2017-12-31 DIAGNOSIS — I10 Essential (primary) hypertension: Secondary | ICD-10-CM | POA: Diagnosis not present

## 2017-12-31 LAB — CBC WITH DIFFERENTIAL (CANCER CENTER ONLY)
Basophils Absolute: 0.2 10*3/uL — ABNORMAL HIGH (ref 0.0–0.1)
Basophils Relative: 3 %
EOS PCT: 4 %
Eosinophils Absolute: 0.3 10*3/uL (ref 0.0–0.5)
HCT: 40.2 % (ref 34.8–46.6)
Hemoglobin: 13.3 g/dL (ref 11.6–15.9)
LYMPHS ABS: 1.8 10*3/uL (ref 0.9–3.3)
LYMPHS PCT: 30 %
MCH: 29.2 pg (ref 25.1–34.0)
MCHC: 33.1 g/dL (ref 31.5–36.0)
MCV: 88.4 fL (ref 79.5–101.0)
Monocytes Absolute: 0.5 10*3/uL (ref 0.1–0.9)
Monocytes Relative: 8 %
Neutro Abs: 3.3 10*3/uL (ref 1.5–6.5)
Neutrophils Relative %: 55 %
PLATELETS: 198 10*3/uL (ref 145–400)
RBC: 4.55 MIL/uL (ref 3.70–5.45)
RDW: 14.9 % — ABNORMAL HIGH (ref 11.2–14.5)
WBC: 6 10*3/uL (ref 3.9–10.3)

## 2017-12-31 LAB — CMP (CANCER CENTER ONLY)
ALBUMIN: 3.9 g/dL (ref 3.5–5.0)
ALT: 9 U/L (ref 0–44)
AST: 17 U/L (ref 15–41)
Alkaline Phosphatase: 94 U/L (ref 38–126)
Anion gap: 6 (ref 5–15)
BUN: 17 mg/dL (ref 8–23)
CHLORIDE: 110 mmol/L (ref 98–111)
CO2: 25 mmol/L (ref 22–32)
Calcium: 9.1 mg/dL (ref 8.9–10.3)
Creatinine: 0.83 mg/dL (ref 0.44–1.00)
GFR, Est AFR Am: >60 mL/min (ref >60)
GLUCOSE: 79 mg/dL (ref 70–99)
POTASSIUM: 4.3 mmol/L (ref 3.5–5.1)
SODIUM: 141 mmol/L (ref 135–145)
Total Bilirubin: 0.4 mg/dL (ref 0.3–1.2)
Total Protein: 6.5 g/dL (ref 6.5–8.1)

## 2017-12-31 NOTE — Telephone Encounter (Signed)
Gave avs and calendar ° °

## 2018-01-07 ENCOUNTER — Ambulatory Visit (INDEPENDENT_AMBULATORY_CARE_PROVIDER_SITE_OTHER): Payer: Medicare Other | Admitting: *Deleted

## 2018-01-07 DIAGNOSIS — Z5181 Encounter for therapeutic drug level monitoring: Secondary | ICD-10-CM

## 2018-01-07 DIAGNOSIS — I6789 Other cerebrovascular disease: Secondary | ICD-10-CM

## 2018-01-07 LAB — POCT INR: INR: 2.9 (ref 2.0–3.0)

## 2018-01-07 NOTE — Patient Instructions (Signed)
Description   Continue taking 1 tablet daily except 1.5 tablets on Mondays and Fridays.  Please call Coumadin Clinic# [phone] Main # [phone] with any problems new medications or if scheduled for any procedures. Recheck in 2-3 weeks.

## 2018-01-27 ENCOUNTER — Ambulatory Visit (INDEPENDENT_AMBULATORY_CARE_PROVIDER_SITE_OTHER): Payer: Medicare Other | Admitting: *Deleted

## 2018-01-27 DIAGNOSIS — Z5181 Encounter for therapeutic drug level monitoring: Secondary | ICD-10-CM | POA: Diagnosis not present

## 2018-01-27 DIAGNOSIS — I6789 Other cerebrovascular disease: Secondary | ICD-10-CM | POA: Diagnosis not present

## 2018-01-27 LAB — POCT INR: INR: 2.7 (ref 2.0–3.0)

## 2018-01-27 NOTE — Patient Instructions (Signed)
Description   Continue taking 1 tablet daily except 1.5 tablets on Mondays and Fridays.  Please call Coumadin Clinic# [phone] Main # [phone] with any problems new medications or if scheduled for any procedures. Recheck in 4 weeks.

## 2018-01-28 DIAGNOSIS — M1812 Unilateral primary osteoarthritis of first carpometacarpal joint, left hand: Secondary | ICD-10-CM | POA: Diagnosis not present

## 2018-01-28 DIAGNOSIS — M1811 Unilateral primary osteoarthritis of first carpometacarpal joint, right hand: Secondary | ICD-10-CM | POA: Diagnosis not present

## 2018-01-28 DIAGNOSIS — M24542 Contracture, left hand: Secondary | ICD-10-CM | POA: Diagnosis not present

## 2018-01-28 DIAGNOSIS — M24541 Contracture, right hand: Secondary | ICD-10-CM | POA: Diagnosis not present

## 2018-02-05 DIAGNOSIS — M65842 Other synovitis and tenosynovitis, left hand: Secondary | ICD-10-CM | POA: Diagnosis not present

## 2018-02-05 DIAGNOSIS — M65841 Other synovitis and tenosynovitis, right hand: Secondary | ICD-10-CM | POA: Diagnosis not present

## 2018-02-07 DIAGNOSIS — D225 Melanocytic nevi of trunk: Secondary | ICD-10-CM | POA: Diagnosis not present

## 2018-02-21 DIAGNOSIS — M79643 Pain in unspecified hand: Secondary | ICD-10-CM | POA: Diagnosis not present

## 2018-03-03 DIAGNOSIS — M25542 Pain in joints of left hand: Secondary | ICD-10-CM | POA: Diagnosis not present

## 2018-03-03 DIAGNOSIS — M25541 Pain in joints of right hand: Secondary | ICD-10-CM | POA: Diagnosis not present

## 2018-03-03 DIAGNOSIS — M25642 Stiffness of left hand, not elsewhere classified: Secondary | ICD-10-CM | POA: Diagnosis not present

## 2018-03-03 DIAGNOSIS — M25641 Stiffness of right hand, not elsewhere classified: Secondary | ICD-10-CM | POA: Diagnosis not present

## 2018-03-05 NOTE — Progress Notes (Deleted)
Cardiology Office Note   Date:  03/05/2018   ID:  Renee Adams, DOB 11-Oct-1933, MRN [id number]  PCP:  Aurea Graff.[name], MD    No chief complaint on file.    Wt Readings from Last 3 Encounters:  12/31/17 147 lb 9.6 oz (67 kg)  09/28/17 143 lb (64.9 kg)  03/05/17 143 lb 12.8 oz (65.2 kg)       History of Present Illness: Renee Adams is a 84 y.o. female  Who has osteoarthritis. She has had both hips and knees replaced. She now has arthritis in her hands.   She has had prior CVA and atrial septal aneurysm, possible PFO was detected at that time in 2004. She was started on Coumadin in 2004. SHe has not had AFib recently.   She has had worsening leg swelling in the past.  She tried Lasix but this did not help.  Amlodipine was decreased but swelling persisted.    Past Medical History:  Diagnosis Date  . ASD (atrial septal defect)   . Cancer [facility])    breast, left  . Carpal tunnel syndrome, bilateral   . Depression   . Gait disorder   . GERD (gastroesophageal reflux disease)   . Glaucoma   . Glaucoma   . History of stroke   . Hypertension   . IBS (irritable bowel syndrome)   . Personal history of radiation therapy 1996  . S/P bunionectomy    bilateral    Past Surgical History:  Procedure Laterality Date  . ABDOMINAL HYSTERECTOMY    . bladder resuspension    . BREAST LUMPECTOMY Left 1996  . CATARACT EXTRACTION Bilateral   . INCISION AND DRAINAGE HIP  03/31/2012   Procedure: IRRIGATION AND DEBRIDEMENT HIP WITH POLY EXCHANGE;  Surgeon: Rudean Haskell, MD;  Location: [facility];  Service: Orthopedics;  Laterality: Left;  . ORIF FEMUR FRACTURE Right 11/30/2012   Procedure: OPEN REDUCTION INTERNAL FIXATION (ORIF) DISTAL FEMUR FRACTURE;  Surgeon: Vickey Huger, MD;  Location: [facility];  Service: Orthopedics;  Laterality: Right;  . TOTAL HIP ARTHROPLASTY Bilateral   . TOTAL KNEE ARTHROPLASTY Bilateral      Current Outpatient Medications  Medication Sig Dispense  Refill  . ALPRAZolam (XANAX) 0.25 MG tablet Take 4 tablets (1 mg total) by mouth every 4 (four) hours as needed for anxiety. 40 tablet 0  . amLODipine (NORVASC) 5 MG tablet Take 2.5 mg by mouth daily.    Marland Kitchen atorvastatin (LIPITOR) 10 MG tablet TAKE 1 TABLET BY MOUTH DAILY. 30 tablet 9  . buPROPion (WELLBUTRIN XL) 300 MG 24 hr tablet Take 1 tablet (300 mg total) by mouth daily. 30 tablet 1  . calcium carbonate 200 MG capsule Take 3 capsules (600 mg total) by mouth 2 (two) times daily with a meal. 60 capsule 1  . Cholecalciferol (VITAMIN D-1000 MAX ST) 1000 units tablet Take by mouth.    . diclofenac sodium (VOLTAREN) 1 % GEL Apply 2 g topically 4 (four) times daily.    Marland Kitchen EPINEPHrine 0.3 mg/0.3 mL IJ SOAJ injection Inject into the muscle.    . ergocalciferol (VITAMIN D2) 50000 UNITS capsule Take 50,000 Units by mouth once a week.    . furosemide (LASIX) 40 MG tablet Take 1 tablet (40 mg total) by mouth daily as needed. 90 tablet 3  . latanoprost (XALATAN) 0.005 % ophthalmic solution Place 1 drop into both eyes at bedtime. 2.5 mL 12  . oxyCODONE (ROXICODONE) 5 MG immediate release tablet Take  0.5 tablets (2.5 mg total) by mouth every 4 (four) hours as needed for severe pain. 7 tablet 0  . rOPINIRole (REQUIP) 1 MG tablet Take 1 tablet (1 mg total) by mouth at bedtime as needed. For restless legs 30 tablet 0  . sertraline (ZOLOFT) 100 MG tablet Take 1.5 tablets (150 mg total) by mouth daily. 30 tablet 1  . warfarin (COUMADIN) 2 MG tablet Take as directed by coumadin clinic 45 tablet 3   No current facility-administered medications for this visit.     Allergies:   Dilaudid [hydromorphone hcl]; Hydromorphone; Morphine; Morphine and related; Sulfamethoxazole; Sulfa antibiotics; Keflex [cephalexin]; and Levaquin [levofloxacin]    Social History:  The patient  reports that she has never smoked. She has never used smokeless tobacco. She reports that she does not drink alcohol or use drugs.   Family  History:  The patient's ***family history includes Breast cancer in her maternal aunt and mother; Cancer in her mother; Diabetes in her sister; Heart failure in her father; Osteoarthritis in her brother.    ROS:  Please see the history of present illness.   Otherwise, review of systems are positive for ***.   All other systems are reviewed and negative.    PHYSICAL EXAM: VS:  There were no vitals taken for this visit. , BMI There is no height or weight on file to calculate BMI. GEN: Well nourished, well developed, in no acute distress  HEENT: normal  Neck: no JVD, carotid bruits, or masses Cardiac: ***RRR; no murmurs, rubs, or gallops,no edema  Respiratory:  clear to auscultation bilaterally, normal work of breathing GI: soft, nontender, nondistended, + BS MS: no deformity or atrophy  Skin: warm and dry, no rash Neuro:  Strength and sensation are intact Psych: euthymic mood, full affect   EKG:   The ekg ordered today demonstrates ***   Recent Labs: 12/31/2017: ALT 9; BUN 17; Creatinine 0.83; Hemoglobin 13.3; Platelet Count 198; Potassium 4.3; Sodium 141   Lipid Panel    Component Value Date/Time   CHOL 202 (H) 01/11/2014 1022   TRIG 156.0 (H) 01/11/2014 1022   HDL 49.70 01/11/2014 1022   CHOLHDL 4 01/11/2014 1022   VLDL 31.2 01/11/2014 1022   LDLCALC 121 (H) 01/11/2014 1022     Other studies Reviewed: Additional studies/ records that were reviewed today with results demonstrating: ***.   ASSESSMENT AND PLAN:  1. Prior CVA: possible PFO from prior echo  2. Hyperlipidemia: 3. HTN: 4. LE edema: Compression stockings are difficult for her to use.    Current medicines are reviewed at length with the patient today.  The patient concerns regarding her medicines were addressed.  The following changes have been made:  No change***  Labs/ tests ordered today include: *** No orders of the defined types were placed in this encounter.   Recommend 150 minutes/week of  aerobic exercise Low fat, low carb, high fiber diet recommended  Disposition:   FU in ***   Signed, Larae Grooms, MD  03/05/2018 5:06 PM    [facility] [place], [place], [place]  [address] Phone: [phone]; Fax: [phone]

## 2018-03-07 ENCOUNTER — Ambulatory Visit: Payer: Medicare Other | Admitting: Interventional Cardiology

## 2018-03-07 DIAGNOSIS — R0989 Other specified symptoms and signs involving the circulatory and respiratory systems: Secondary | ICD-10-CM

## 2018-03-11 ENCOUNTER — Encounter: Payer: Self-pay | Admitting: Interventional Cardiology

## 2018-03-11 DIAGNOSIS — M25541 Pain in joints of right hand: Secondary | ICD-10-CM | POA: Diagnosis not present

## 2018-03-11 DIAGNOSIS — M25641 Stiffness of right hand, not elsewhere classified: Secondary | ICD-10-CM | POA: Diagnosis not present

## 2018-03-11 DIAGNOSIS — M25542 Pain in joints of left hand: Secondary | ICD-10-CM | POA: Diagnosis not present

## 2018-03-11 DIAGNOSIS — M25642 Stiffness of left hand, not elsewhere classified: Secondary | ICD-10-CM | POA: Diagnosis not present

## 2018-03-26 ENCOUNTER — Ambulatory Visit (INDEPENDENT_AMBULATORY_CARE_PROVIDER_SITE_OTHER): Payer: Medicare Other

## 2018-03-26 DIAGNOSIS — Z5181 Encounter for therapeutic drug level monitoring: Secondary | ICD-10-CM | POA: Diagnosis not present

## 2018-03-26 DIAGNOSIS — I6789 Other cerebrovascular disease: Secondary | ICD-10-CM | POA: Diagnosis not present

## 2018-03-26 LAB — POCT INR: INR: 1.5 — AB (ref 2.0–3.0)

## 2018-03-26 NOTE — Patient Instructions (Signed)
Description   Take 2 tablets today, then resume same dosage 1 tablet daily except 1.5 tablets on Mondays and Fridays.  Recheck in 2 weeks. Please call Coumadin Clinic# [phone] Main # [phone] with any problems new medications or if scheduled for any procedures.

## 2018-04-09 ENCOUNTER — Ambulatory Visit (INDEPENDENT_AMBULATORY_CARE_PROVIDER_SITE_OTHER): Payer: Medicare Other | Admitting: Pharmacist

## 2018-04-09 DIAGNOSIS — I6789 Other cerebrovascular disease: Secondary | ICD-10-CM | POA: Diagnosis not present

## 2018-04-09 DIAGNOSIS — Z5181 Encounter for therapeutic drug level monitoring: Secondary | ICD-10-CM

## 2018-04-09 LAB — POCT INR: INR: 1.2 — AB (ref 2.0–3.0)

## 2018-04-09 NOTE — Patient Instructions (Signed)
Description   Take 1.5 tablets today and tomorrow then change to 1 tablet daily except 1.5 tablets on Mondays Wednesdays and Fridays.  Recheck in 1 week. Please call Coumadin Clinic# [phone] Main # [phone] with any problems new medications or if scheduled for any procedures.

## 2018-04-11 DIAGNOSIS — H903 Sensorineural hearing loss, bilateral: Secondary | ICD-10-CM | POA: Diagnosis not present

## 2018-04-29 ENCOUNTER — Ambulatory Visit: Payer: Medicare Other | Admitting: Physician Assistant

## 2018-04-29 ENCOUNTER — Encounter

## 2018-04-29 NOTE — Progress Notes (Deleted)
Cardiology Office Note    Date:  04/29/2018   ID:  Adams Renee, DOB 08-24-1933, MRN [id number]  PCP:  Aurea Graff.[name], MD  Cardiologist: Larae Grooms, MD EPS: None  No chief complaint on file.   History of Present Illness:  Renee Adams is a 84 y.o. female with history of prior CVA, atrial septal aneurysm and possible PFO detected at the time in 2004 started on Coumadin. PAF. Also has hypertension, HLD, lower extremity edema.  Last saw Dr. Irish Lack 02/2017 at which time she was given Lasix 40 mg to use as needed.  Arthritis makes it difficult for her to use compression stockings.  Last 2D echo 2012 LVEF 60 to 65% normal valves, possible atrial septal aneurysm and cannot rule out PFO.    Past Medical History:  Diagnosis Date  . ASD (atrial septal defect)   . Cancer [facility])    breast, left  . Carpal tunnel syndrome, bilateral   . Depression   . Gait disorder   . GERD (gastroesophageal reflux disease)   . Glaucoma   . Glaucoma   . History of stroke   . Hypertension   . IBS (irritable bowel syndrome)   . Personal history of radiation therapy 1996  . S/P bunionectomy    bilateral    Past Surgical History:  Procedure Laterality Date  . ABDOMINAL HYSTERECTOMY    . bladder resuspension    . BREAST LUMPECTOMY Left 1996  . CATARACT EXTRACTION Bilateral   . INCISION AND DRAINAGE HIP  03/31/2012   Procedure: IRRIGATION AND DEBRIDEMENT HIP WITH POLY EXCHANGE;  Surgeon: Rudean Haskell, MD;  Location: [facility];  Service: Orthopedics;  Laterality: Left;  . ORIF FEMUR FRACTURE Right 11/30/2012   Procedure: OPEN REDUCTION INTERNAL FIXATION (ORIF) DISTAL FEMUR FRACTURE;  Surgeon: Vickey Huger, MD;  Location: [facility];  Service: Orthopedics;  Laterality: Right;  . TOTAL HIP ARTHROPLASTY Bilateral   . TOTAL KNEE ARTHROPLASTY Bilateral     Current Medications: No outpatient medications have been marked as taking for the 04/29/18 encounter (Appointment) with Renee Burn, PA-C.       Allergies:   Dilaudid [hydromorphone hcl]; Hydromorphone; Morphine; Morphine and related; Sulfamethoxazole; Sulfa antibiotics; Keflex [cephalexin]; and Levaquin [levofloxacin]   Social History   Socioeconomic History  . Marital status: Widowed    Spouse name: Not on file  . Number of children: Not on file  . Years of education: Not on file  . Highest education level: Not on file  Occupational History  . Not on file  Social Needs  . Financial resource strain: Not on file  . Food insecurity:    Worry: Not on file    Inability: Not on file  . Transportation needs:    Medical: Not on file    Non-medical: Not on file  Tobacco Use  . Smoking status: Never Smoker  . Smokeless tobacco: Never Used  Substance and Sexual Activity  . Alcohol use: No  . Drug use: No  . Sexual activity: Not Currently  Lifestyle  . Physical activity:    Days per week: Not on file    Minutes per session: Not on file  . Stress: Not on file  Relationships  . Social connections:    Talks on phone: Not on file    Gets together: Not on file    Attends religious service: Not on file    Active member of club or organization: Not on file    Attends meetings  of clubs or organizations: Not on file    Relationship status: Not on file  Other Topics Concern  . Not on file  Social History Narrative  . Not on file     Family History:  The patient's ***family history includes Breast cancer in her maternal aunt and mother; Cancer in her mother; Diabetes in her sister; Heart failure in her father; Osteoarthritis in her brother.   ROS:   Please see the history of present illness.    ROS All other systems reviewed and are negative.   PHYSICAL EXAM:   VS:  There were no vitals taken for this visit.  Physical Exam  GEN: Well nourished, well developed, in no acute distress  HEENT: normal  Neck: no JVD, carotid bruits, or masses Cardiac:RRR; no murmurs, rubs, or gallops  Respiratory:  clear to auscultation  bilaterally, normal work of breathing GI: soft, nontender, nondistended, + BS Ext: without cyanosis, clubbing, or edema, Good distal pulses bilaterally MS: no deformity or atrophy  Skin: warm and dry, no rash Neuro:  Alert and Oriented x 3, Strength and sensation are intact Psych: euthymic mood, full affect  Wt Readings from Last 3 Encounters:  12/31/17 147 lb 9.6 oz (67 kg)  09/28/17 143 lb (64.9 kg)  03/05/17 143 lb 12.8 oz (65.2 kg)      Studies/Labs Reviewed:   EKG:  EKG is*** ordered today.  The ekg ordered today demonstrates ***  Recent Labs: 12/31/2017: ALT 9; BUN 17; Creatinine 0.83; Hemoglobin 13.3; Platelet Count 198; Potassium 4.3; Sodium 141   Lipid Panel    Component Value Date/Time   CHOL 202 (H) 01/11/2014 1022   TRIG 156.0 (H) 01/11/2014 1022   HDL 49.70 01/11/2014 1022   CHOLHDL 4 01/11/2014 1022   VLDL 31.2 01/11/2014 1022   LDLCALC 121 (H) 01/11/2014 1022    Additional studies/ records that were reviewed today include:  2D echo from 2012 reviewed above.    ASSESSMENT:    1. Paroxysmal atrial fibrillation (HCC)   2. Atrial septal defect   3. Mixed hyperlipidemia   4. Essential hypertension, benign      PLAN:  In order of problems listed above:  PAF on Coumadin  ASD with PFO and CVA in 204 on Coumadin  Hyperlipidemia  Essential hypertension    Medication Adjustments/Labs and Tests Ordered: Current medicines are reviewed at length with the patient today.  Concerns regarding medicines are outlined above.  Medication changes, Labs and Tests ordered today are listed in the Patient Instructions below. There are no Patient Instructions on file for this visit.   Sumner Boast, PA-C  04/29/2018 11:34 AM    [facility] [place], [place], [place]  [address] Phone: [phone]; Fax: [phone]

## 2018-06-30 ENCOUNTER — Ambulatory Visit (INDEPENDENT_AMBULATORY_CARE_PROVIDER_SITE_OTHER): Payer: Medicare Other | Admitting: Orthopaedic Surgery

## 2018-07-03 DIAGNOSIS — E7800 Pure hypercholesterolemia, unspecified: Secondary | ICD-10-CM | POA: Diagnosis not present

## 2018-07-03 DIAGNOSIS — F321 Major depressive disorder, single episode, moderate: Secondary | ICD-10-CM | POA: Diagnosis not present

## 2018-07-03 DIAGNOSIS — E559 Vitamin D deficiency, unspecified: Secondary | ICD-10-CM | POA: Diagnosis not present

## 2018-07-03 DIAGNOSIS — M79643 Pain in unspecified hand: Secondary | ICD-10-CM | POA: Diagnosis not present

## 2018-07-03 DIAGNOSIS — G2581 Restless legs syndrome: Secondary | ICD-10-CM | POA: Diagnosis not present

## 2018-07-03 DIAGNOSIS — I10 Essential (primary) hypertension: Secondary | ICD-10-CM | POA: Diagnosis not present

## 2018-07-03 DIAGNOSIS — Z23 Encounter for immunization: Secondary | ICD-10-CM | POA: Diagnosis not present

## 2018-07-04 ENCOUNTER — Ambulatory Visit (INDEPENDENT_AMBULATORY_CARE_PROVIDER_SITE_OTHER): Payer: Medicare Other | Admitting: Pharmacist

## 2018-07-04 DIAGNOSIS — I6789 Other cerebrovascular disease: Secondary | ICD-10-CM | POA: Diagnosis not present

## 2018-07-04 DIAGNOSIS — Z5181 Encounter for therapeutic drug level monitoring: Secondary | ICD-10-CM

## 2018-07-04 LAB — POCT INR: INR: 1.3 — AB (ref 2.0–3.0)

## 2018-07-04 NOTE — Patient Instructions (Signed)
Description   Take 2 tablets today and 1.5 tablets tomorrow, then start taking 1.5 tablets daily except 1 tablet on Tuesdays and Saturdays.  Recheck in 1 week. Please call Coumadin Clinic# [phone] Main # [phone] with any problems new medications or if scheduled for any procedures.

## 2018-07-14 ENCOUNTER — Ambulatory Visit (INDEPENDENT_AMBULATORY_CARE_PROVIDER_SITE_OTHER): Payer: Medicare Other

## 2018-07-14 DIAGNOSIS — I6789 Other cerebrovascular disease: Secondary | ICD-10-CM | POA: Diagnosis not present

## 2018-07-14 DIAGNOSIS — Z5181 Encounter for therapeutic drug level monitoring: Secondary | ICD-10-CM | POA: Diagnosis not present

## 2018-07-14 LAB — POCT INR: INR: 2.2 (ref 2.0–3.0)

## 2018-07-14 NOTE — Patient Instructions (Signed)
Description   Continue taking 1.5 tablets daily except 1 tablet on Tuesdays and Saturdays.  Recheck in 2 week. Please call Coumadin Clinic# [phone] Main # [phone] with any problems new medications or if scheduled for any procedures.

## 2018-08-01 ENCOUNTER — Other Ambulatory Visit: Payer: Self-pay | Admitting: Interventional Cardiology

## 2018-08-05 NOTE — Progress Notes (Deleted)
Office Visit Note  Patient: Renee Adams             Date of Birth: 11/27/1933           MRN: [id number]             PCP: Alroy Dust, L.[name], MD Referring: Alroy Dust, Carlean Jews.[name], MD Visit Date: 08/19/2018 Occupation: @GUAROCC @  Subjective:  No chief complaint on file.   History of Present Illness: Renee Adams is a 85 y.o. female ***   Activities of Daily Living:  Patient reports morning stiffness for *** {minute/hour:19697}.   Patient {ACTIONS;DENIES/REPORTS:21021675::"Denies"} nocturnal pain.  Difficulty dressing/grooming: {ACTIONS;DENIES/REPORTS:21021675::"Denies"} Difficulty climbing stairs: {ACTIONS;DENIES/REPORTS:21021675::"Denies"} Difficulty getting out of chair: {ACTIONS;DENIES/REPORTS:21021675::"Denies"} Difficulty using hands for taps, buttons, cutlery, and/or writing: {ACTIONS;DENIES/REPORTS:21021675::"Denies"}  No Rheumatology ROS completed.   PMFS History:  Patient Active Problem List   Diagnosis Date Noted  . Anticoagulated 03/05/2017  . Malignant neoplasm of overlapping sites of left breast in female, estrogen receptor positive ([place]) 10/11/2016  . Atrial septal defect 01/05/2015  . Hand weakness 05/27/2014  . Mixed hyperlipidemia 01/01/2014  . Atrial fibrillation ([place]) 01/01/2014  . Encounter for therapeutic drug monitoring 08/06/2013  . Acute, but ill-defined, cerebrovascular disease 04/24/2013  . Fracture of femoral neck, right ([place]) 12/03/2012  . Fall 11/26/2012  . Fracture of femoral shaft, right, closed ([place]) 11/26/2012  . UTI (urinary tract infection) 11/26/2012  . Essential hypertension, benign 08/18/2012  . Status post left hip replacement 04/18/2012  . Osteomyelitis of left hip ([place]) 03/31/2012    Past Medical History:  Diagnosis Date  . ASD (atrial septal defect)   . Cancer [facility])    breast, left  . Carpal tunnel syndrome, bilateral   . Depression   . Gait disorder   . GERD (gastroesophageal reflux disease)   . Glaucoma   . Glaucoma     . History of stroke   . Hypertension   . IBS (irritable bowel syndrome)   . Personal history of radiation therapy 1996  . S/P bunionectomy    bilateral    Family History  Problem Relation Age of Onset  . Cancer Mother   . Breast cancer Mother   . Heart failure Father   . Diabetes Sister   . Osteoarthritis Brother   . Breast cancer Maternal Aunt   . Heart attack Neg Hx    Past Surgical History:  Procedure Laterality Date  . ABDOMINAL HYSTERECTOMY    . bladder resuspension    . BREAST LUMPECTOMY Left 1996  . CATARACT EXTRACTION Bilateral   . INCISION AND DRAINAGE HIP  03/31/2012   Procedure: IRRIGATION AND DEBRIDEMENT HIP WITH POLY EXCHANGE;  Surgeon: Rudean Haskell, MD;  Location: [facility];  Service: Orthopedics;  Laterality: Left;  . ORIF FEMUR FRACTURE Right 11/30/2012   Procedure: OPEN REDUCTION INTERNAL FIXATION (ORIF) DISTAL FEMUR FRACTURE;  Surgeon: Vickey Huger, MD;  Location: [facility];  Service: Orthopedics;  Laterality: Right;  . TOTAL HIP ARTHROPLASTY Bilateral   . TOTAL KNEE ARTHROPLASTY Bilateral    Social History   Social History Narrative  . Not on file    There is no immunization history on file for this patient.   Objective: Vital Signs: There were no vitals taken for this visit.   Physical Exam   Musculoskeletal Exam: ***  CDAI Exam: CDAI Score: Not documented Patient Global Assessment: Not documented; Provider Global Assessment: Not documented Swollen: Not documented; Tender: Not documented Joint Exam   Not documented   There is currently no  information documented on the homunculus. Go to the Rheumatology activity and complete the homunculus joint exam.  Investigation: No additional findings.  Imaging: No results found.  Recent Labs: Lab Results  Component Value Date   WBC 6.0 12/31/2017   HGB 13.3 12/31/2017   PLT 198 12/31/2017   NA 141 12/31/2017   K 4.3 12/31/2017   CL 110 12/31/2017   CO2 25 12/31/2017   GLUCOSE 79 12/31/2017    BUN 17 12/31/2017   CREATININE 0.83 12/31/2017   BILITOT 0.4 12/31/2017   ALKPHOS 94 12/31/2017   AST 17 12/31/2017   ALT 9 12/31/2017   PROT 6.5 12/31/2017   ALBUMIN 3.9 12/31/2017   CALCIUM 9.1 12/31/2017   GFRAA >60 12/31/2017    Speciality Comments: No specialty comments available.  Procedures:  No procedures performed Allergies: Dilaudid [hydromorphone hcl]; Hydromorphone; Morphine; Morphine and related; Sulfamethoxazole; Sulfa antibiotics; Keflex [cephalexin]; and Levaquin [levofloxacin]   Assessment / Plan:     Visit Diagnoses: Pain in both hands - Long-standing deformity and worsened functional abilities previous pt-20167/31/19:ANA-, RF-, sed rate 8, CRP<1  History of bilateral hip replacements - Left 2007, revision 2013-Dr. Lucey  History of total knee replacement, right - 1998, revision 2012-Dr. Lucey  History of total knee arthroplasty, left - 2004  Vitamin D deficiency  RLS (restless legs syndrome)  History of IBS  Hypercholesterolemia  History of glaucoma  History of gastroesophageal reflux (GERD)  History of CVA (cerebrovascular accident)  Clotting disorder ([place])  Heart murmur  History of depression  Essential hypertension  History of breast cancer - L, 1996, Dr. Jana Hakim    Orders: No orders of the defined types were placed in this encounter.  No orders of the defined types were placed in this encounter.   Face-to-face time spent with patient was *** minutes. Greater than 50% of time was spent in counseling and coordination of care.  Follow-Up Instructions: No follow-ups on file.   Ofilia Neas, PA-C  Note - This record has been created using Dragon software.  Chart creation errors have been sought, but may not always  have been located. Such creation errors do not reflect on  the standard of medical care.

## 2018-08-12 ENCOUNTER — Ambulatory Visit: Payer: Medicare Other | Admitting: Physician Assistant

## 2018-08-13 ENCOUNTER — Encounter: Payer: Self-pay | Admitting: Physician Assistant

## 2018-08-18 ENCOUNTER — Other Ambulatory Visit: Payer: Self-pay | Admitting: Interventional Cardiology

## 2018-08-19 ENCOUNTER — Ambulatory Visit: Payer: Medicare Other | Admitting: Rheumatology

## 2018-08-25 ENCOUNTER — Other Ambulatory Visit: Payer: Self-pay | Admitting: Interventional Cardiology

## 2018-08-25 DIAGNOSIS — I10 Essential (primary) hypertension: Secondary | ICD-10-CM

## 2018-08-25 DIAGNOSIS — Q211 Atrial septal defect, unspecified: Secondary | ICD-10-CM

## 2018-08-25 DIAGNOSIS — E782 Mixed hyperlipidemia: Secondary | ICD-10-CM

## 2018-09-05 ENCOUNTER — Ambulatory Visit (INDEPENDENT_AMBULATORY_CARE_PROVIDER_SITE_OTHER): Payer: Medicare Other

## 2018-09-05 DIAGNOSIS — I6789 Other cerebrovascular disease: Secondary | ICD-10-CM | POA: Diagnosis not present

## 2018-09-05 DIAGNOSIS — Z5181 Encounter for therapeutic drug level monitoring: Secondary | ICD-10-CM

## 2018-09-05 LAB — POCT INR: INR: 1.8 — AB (ref 2.0–3.0)

## 2018-09-05 NOTE — Patient Instructions (Signed)
Description   Take 2 tablets today, then start taking 1.5 tablets daily except 1 tablet on Tuesdays.  Recheck in 2 weeks. Please call Coumadin Clinic# [phone] Main # [phone] with any problems new medications or if scheduled for any procedures.

## 2018-09-10 NOTE — Progress Notes (Deleted)
Office Visit Note  Patient: Renee Adams             Date of Birth: [birth year]           MRN: [id number]             PCP: Alroy Dust, L.[name], MD Referring: Alroy Dust, Carlean Jews.[name], MD Visit Date: 09/24/2018 Occupation: @GUAROCC @  Subjective:  No chief complaint on file.   History of Present Illness: Renee Adams is a 85 y.o. female ***   Activities of Daily Living:  Patient reports morning stiffness for *** {minute/hour:19697}.   Patient {ACTIONS;DENIES/REPORTS:21021675::"Denies"} nocturnal pain.  Difficulty dressing/grooming: {ACTIONS;DENIES/REPORTS:21021675::"Denies"} Difficulty climbing stairs: {ACTIONS;DENIES/REPORTS:21021675::"Denies"} Difficulty getting out of chair: {ACTIONS;DENIES/REPORTS:21021675::"Denies"} Difficulty using hands for taps, buttons, cutlery, and/or writing: {ACTIONS;DENIES/REPORTS:21021675::"Denies"}  No Rheumatology ROS completed.   PMFS History:  Patient Active Problem List   Diagnosis Date Noted  . Anticoagulated 03/05/2017  . Malignant neoplasm of overlapping sites of left breast in female, estrogen receptor positive ([place]) 10/11/2016  . Atrial septal defect 01/05/2015  . Hand weakness 05/27/2014  . Mixed hyperlipidemia 01/01/2014  . Atrial fibrillation ([place]) 01/01/2014  . Encounter for therapeutic drug monitoring 08/06/2013  . Acute, but ill-defined, cerebrovascular disease 04/24/2013  . Fracture of femoral neck, right ([place]) 12/03/2012  . Fall 11/26/2012  . Fracture of femoral shaft, right, closed ([place]) 11/26/2012  . UTI (urinary tract infection) 11/26/2012  . Essential hypertension, benign 08/18/2012  . Status post left hip replacement 04/18/2012  . Osteomyelitis of left hip ([place]) 03/31/2012    Past Medical History:  Diagnosis Date  . ASD (atrial septal defect)   . Cancer [facility])    breast, left  . Carpal tunnel syndrome, bilateral   . Depression   . Gait disorder   . GERD (gastroesophageal reflux disease)   . Glaucoma   . Glaucoma     . History of stroke   . Hypertension   . IBS (irritable bowel syndrome)   . Personal history of radiation therapy 1996  . S/P bunionectomy    bilateral    Family History  Problem Relation Age of Onset  . Cancer Mother   . Breast cancer Mother   . Heart failure Father   . Diabetes Sister   . Osteoarthritis Brother   . Breast cancer Maternal Aunt   . Heart attack Neg Hx    Past Surgical History:  Procedure Laterality Date  . ABDOMINAL HYSTERECTOMY    . bladder resuspension    . BREAST LUMPECTOMY Left 1996  . CATARACT EXTRACTION Bilateral   . INCISION AND DRAINAGE HIP  03/31/2012   Procedure: IRRIGATION AND DEBRIDEMENT HIP WITH POLY EXCHANGE;  Surgeon: Rudean Haskell, MD;  Location: [place];  Service: Orthopedics;  Laterality: Left;  . ORIF FEMUR FRACTURE Right 11/30/2012   Procedure: OPEN REDUCTION INTERNAL FIXATION (ORIF) DISTAL FEMUR FRACTURE;  Surgeon: Vickey Huger, MD;  Location: [place];  Service: Orthopedics;  Laterality: Right;  . TOTAL HIP ARTHROPLASTY Bilateral   . TOTAL KNEE ARTHROPLASTY Bilateral    Social History   Social History Narrative  . Not on file    There is no immunization history on file for this patient.   Objective: Vital Signs: There were no vitals taken for this visit.   Physical Exam   Musculoskeletal Exam: ***  CDAI Exam: CDAI Score: Not documented Patient Global Assessment: Not documented; Provider Global Assessment: Not documented Swollen: Not documented; Tender: Not documented Joint Exam   Not documented   There is currently no  information documented on the homunculus. Go to the Rheumatology activity and complete the homunculus joint exam.  Investigation: Findings:  02/05/18: ANA-, RF<10, sed rate 8, CRP <1, TSH 2.07   Imaging: No results found.  Recent Labs: Lab Results  Component Value Date   WBC 6.0 12/31/2017   HGB 13.3 12/31/2017   PLT 198 12/31/2017   NA 141 12/31/2017   K 4.3 12/31/2017   CL 110 12/31/2017   CO2 25  12/31/2017   GLUCOSE 79 12/31/2017   BUN 17 12/31/2017   CREATININE 0.83 12/31/2017   BILITOT 0.4 12/31/2017   ALKPHOS 94 12/31/2017   AST 17 12/31/2017   ALT 9 12/31/2017   PROT 6.5 12/31/2017   ALBUMIN 3.9 12/31/2017   CALCIUM 9.1 12/31/2017   GFRAA >60 12/31/2017    Speciality Comments: No specialty comments available.  Procedures:  No procedures performed Allergies: Dilaudid [hydromorphone hcl]; Hydromorphone; Morphine; Morphine and related; Sulfamethoxazole; Sulfa antibiotics; Keflex [cephalexin]; and Levaquin [levofloxacin]   Assessment / Plan:     Visit Diagnoses: Pain in both hands - previous pt in 20167/31/19: ANA-, RF<10, sed rate 8, CRP <1, TSH 2.07  Acquired bilateral hand deformity  S/P bilateral hip replacements  Status post total bilateral knee replacement  Other fatigue  RLS (restless legs syndrome)  Essential hypertension  Vitamin D deficiency  History of glaucoma  History of IBS  History of gastroesophageal reflux (GERD)  History of CVA (cerebrovascular accident)  Clotting disorder ([place])  Heart murmur   Orders: No orders of the defined types were placed in this encounter.  No orders of the defined types were placed in this encounter.   Face-to-face time spent with patient was *** minutes. Greater than 50% of time was spent in counseling and coordination of care.  Follow-Up Instructions: No follow-ups on file.   Ofilia Neas, PA-C  Note - This record has been created using Dragon software.  Chart creation errors have been sought, but may not always  have been located. Such creation errors do not reflect on  the standard of medical care.

## 2018-09-16 ENCOUNTER — Ambulatory Visit: Payer: Medicare Other | Admitting: Rheumatology

## 2018-09-23 ENCOUNTER — Telehealth: Payer: Self-pay | Admitting: Internal Medicine

## 2018-09-23 NOTE — Telephone Encounter (Signed)
Left msg on VM APpt is a routine follow up   She has not been seen in a while With corona virus outbreak would recomm rescheduling to avoid exposure   Will need to call her to set up for later spring/summer

## 2018-09-24 ENCOUNTER — Ambulatory Visit: Payer: Medicare Other | Admitting: Physician Assistant

## 2018-09-24 ENCOUNTER — Ambulatory Visit: Payer: Medicare Other | Admitting: Rheumatology

## 2018-09-25 ENCOUNTER — Encounter: Payer: Self-pay | Admitting: Physician Assistant

## 2018-10-10 NOTE — Telephone Encounter (Signed)
Virtual Visit Pre-Appointment Phone Call    TELEPHONE CALL NOTE  Renee Adams has been deemed a candidate for a follow-up tele-health visit to limit community exposure during the Covid-19 pandemic. I spoke with the patient via phone to ensure availability of phone/video source, confirm preferred email & phone number, and discuss instructions and expectations.  I reminded Renee Adams to be prepared with any vital sign and/or heart rhythm information that could potentially be obtained via home monitoring, at the time of her visit. I reminded Renee Adams to expect a phone call at the time of her visit if her visit.  Did the patient verbally acknowledge consent to treatment? YES  Patient only has flip phone and wishes to arrange TELEPHONE Visit with Dr. Irish Lack on 4/6.  Cleon Gustin, RN 10/10/2018 12:49 PM   DOWNLOADING THE [place], go to [employer] and type in WebEx in the search bar. [place] [employer], the blue/green circle. The app is free but as with any other app downloads, their phone may require them to verify saved payment information or Apple password. The patient does NOT have to create an account.  - If Android, ask patient to go to [employer] and type in WebEx in the search bar. [place] [employer], the blue/green circle. The app is free but as with any other app downloads, their phone may require them to verify saved payment information or Android password. The patient does NOT have to create an account.   CONSENT FOR TELE-HEALTH VISIT - PLEASE REVIEW  I hereby voluntarily request, consent and authorize [facility] and its employed or contracted physicians, physician assistants, nurse practitioners or other licensed health care professionals (the Practitioner), to provide me with telemedicine health care services (the "Services") as deemed necessary by the treating Practitioner. I acknowledge and  consent to receive the Services by the Practitioner via telemedicine. I understand that the telemedicine visit will involve communicating with the Practitioner through live audiovisual communication technology and the disclosure of certain medical information by electronic transmission. I acknowledge that I have been given the opportunity to request an in-person assessment or other available alternative prior to the telemedicine visit and am voluntarily participating in the telemedicine visit.  I understand that I have the right to withhold or withdraw my consent to the use of telemedicine in the course of my care at any time, without affecting my right to future care or treatment, and that the Practitioner or I may terminate the telemedicine visit at any time. I understand that I have the right to inspect all information obtained and/or recorded in the course of the telemedicine visit and may receive copies of available information for a reasonable fee.  I understand that some of the potential risks of receiving the Services via telemedicine include:  Marland Kitchen Delay or interruption in medical evaluation due to technological equipment failure or disruption; . Information transmitted may not be sufficient (e.g. poor resolution of images) to allow for appropriate medical decision making by the Practitioner; and/or  . In rare instances, security protocols could fail, causing a breach of personal health information.  Furthermore, I acknowledge that it is my responsibility to provide information about my medical history, conditions and care that is complete and accurate to the best of my ability. I acknowledge that Practitioner's advice, recommendations, and/or decision may be based on factors not within their control, such as incomplete or inaccurate data  provided by me or distortions of diagnostic images or specimens that may result from electronic transmissions. I understand that the practice of medicine is not an  exact science and that Practitioner makes no warranties or guarantees regarding treatment outcomes. I acknowledge that I will receive a copy of this consent concurrently upon execution via email to the email address I last provided but may also request a printed copy by calling the office of [facility].    I understand that my insurance will be billed for this visit.   I have read or had this consent read to me. . I understand the contents of this consent, which adequately explains the benefits and risks of the Services being provided via telemedicine.  . I have been provided ample opportunity to ask questions regarding this consent and the Services and have had my questions answered to my satisfaction. . I give my informed consent for the services to be provided through the use of telemedicine in my medical care  By participating in this telemedicine visit I agree to the above.

## 2018-10-13 ENCOUNTER — Telehealth: Payer: Medicare Other | Admitting: Interventional Cardiology

## 2018-10-13 ENCOUNTER — Other Ambulatory Visit: Payer: Self-pay

## 2018-10-13 ENCOUNTER — Telehealth: Payer: Self-pay | Admitting: Interventional Cardiology

## 2018-10-13 NOTE — Telephone Encounter (Signed)
Called and left a message for patient to call back .

## 2018-10-13 NOTE — Telephone Encounter (Signed)
New Message   Pt wont be able to make Virtual Visit appt today

## 2018-10-13 NOTE — Telephone Encounter (Signed)
Called pt to reschedule phone visit. Left message asking her to call the office.

## 2018-10-16 ENCOUNTER — Telehealth: Payer: Self-pay | Admitting: Interventional Cardiology

## 2018-10-16 NOTE — Telephone Encounter (Addendum)
[employer] home INR monitor form filled out and faxed on 10/17/18.

## 2018-10-16 NOTE — Telephone Encounter (Signed)
Daughter of pt calling because the pt does not want to leave the house to have her INR tested.  Daughter says there is a machine that the pt can have to test her INR at home, but it requires doctor approval to get. Son that lives closer to the pt can assist with testing and sending in results if necessary.  If the machine can not be approved is there a home health nurse that can come to test the pt INR?   Please contact daughter with the best solutions. Do NOT contact the patient directly

## 2018-10-16 NOTE — Telephone Encounter (Signed)
Left message for patient to call back and reschedule evisit.

## 2018-10-16 NOTE — Telephone Encounter (Signed)
Spoke with patient daughter. She is aware of drive up clinic, but still does not want mother coming out of home. She and her brother have discussed and would like to get home monitoring set up. Darryl (pts son) will take ownership of testing for pt since she is easily confused.   Will work to get home testing set up for pt. They are aware that may be some time before testing set up. Daughter states understanding and it aware to call with any additional questions as this time. They have also been advised of need to monitor and that could be unsafe to wait several months to test.   Will have in office fill out form and fax to company for approval of home testing.

## 2018-10-20 ENCOUNTER — Telehealth: Payer: Self-pay

## 2018-10-20 ENCOUNTER — Telehealth: Payer: Self-pay | Admitting: *Deleted

## 2018-10-20 NOTE — Telephone Encounter (Signed)

## 2018-10-20 NOTE — Telephone Encounter (Signed)
lmom for prescreen/drive thru 

## 2018-10-23 ENCOUNTER — Ambulatory Visit: Payer: Medicare Other | Admitting: Rheumatology

## 2018-10-31 NOTE — Telephone Encounter (Signed)
Left message to call back and schedule virtual visit with Ermalinda Barrios, PA.

## 2018-11-11 ENCOUNTER — Telehealth: Payer: Self-pay

## 2018-11-11 NOTE — Telephone Encounter (Signed)
lmom for prescreen  

## 2018-11-13 ENCOUNTER — Other Ambulatory Visit: Payer: Self-pay

## 2018-11-13 ENCOUNTER — Ambulatory Visit (INDEPENDENT_AMBULATORY_CARE_PROVIDER_SITE_OTHER): Payer: Medicare Other | Admitting: Pharmacist

## 2018-11-13 DIAGNOSIS — Z5181 Encounter for therapeutic drug level monitoring: Secondary | ICD-10-CM | POA: Diagnosis not present

## 2018-11-13 DIAGNOSIS — I6789 Other cerebrovascular disease: Secondary | ICD-10-CM

## 2018-11-13 LAB — POCT INR: INR: 1.3 — AB (ref 2.0–3.0)

## 2018-11-14 NOTE — Patient Instructions (Signed)
Description   Spoke with pt's daughter (on [place]) and instructed pt take 2 tablets today and tomorrow, then start taking 1.5 tablets daily.  Recheck in 1 week. Please call [facility]# [phone] Main # [phone] with any problems new medications or if scheduled for any procedures.

## 2018-11-18 NOTE — Telephone Encounter (Signed)
Called and left message on both numbers for patient to call back and schedule virtual visit with Ermalinda Barrios, PA.

## 2018-11-19 ENCOUNTER — Telehealth: Payer: Self-pay

## 2018-11-19 NOTE — Telephone Encounter (Signed)

## 2018-11-19 NOTE — Telephone Encounter (Signed)
lmom for prescreen  

## 2018-11-20 ENCOUNTER — Other Ambulatory Visit: Payer: Self-pay

## 2018-11-20 ENCOUNTER — Ambulatory Visit (INDEPENDENT_AMBULATORY_CARE_PROVIDER_SITE_OTHER): Payer: Medicare Other | Admitting: Pharmacist

## 2018-11-20 DIAGNOSIS — I6789 Other cerebrovascular disease: Secondary | ICD-10-CM

## 2018-11-20 DIAGNOSIS — Z5181 Encounter for therapeutic drug level monitoring: Secondary | ICD-10-CM | POA: Diagnosis not present

## 2018-11-20 LAB — POCT INR: INR: 3.1 — AB (ref 2.0–3.0)

## 2018-11-21 ENCOUNTER — Telehealth: Payer: Self-pay | Admitting: Pharmacist

## 2018-12-03 ENCOUNTER — Other Ambulatory Visit: Payer: Self-pay | Admitting: Family Medicine

## 2018-12-03 ENCOUNTER — Other Ambulatory Visit: Payer: Self-pay

## 2018-12-03 DIAGNOSIS — Z1231 Encounter for screening mammogram for malignant neoplasm of breast: Secondary | ICD-10-CM

## 2018-12-08 ENCOUNTER — Telehealth: Payer: Self-pay

## 2018-12-08 NOTE — Telephone Encounter (Signed)
lmom for prescreen  

## 2018-12-10 ENCOUNTER — Telehealth: Payer: Self-pay | Admitting: Pharmacist

## 2018-12-10 NOTE — Telephone Encounter (Signed)

## 2018-12-16 NOTE — Telephone Encounter (Signed)
Left message for patient to call back and schedule appointment with Ermalinda Barrios, PA.

## 2018-12-18 ENCOUNTER — Ambulatory Visit (INDEPENDENT_AMBULATORY_CARE_PROVIDER_SITE_OTHER): Payer: Medicare Other | Admitting: *Deleted

## 2018-12-18 ENCOUNTER — Telehealth: Payer: Self-pay | Admitting: Interventional Cardiology

## 2018-12-18 ENCOUNTER — Other Ambulatory Visit: Payer: Self-pay

## 2018-12-18 DIAGNOSIS — I6789 Other cerebrovascular disease: Secondary | ICD-10-CM

## 2018-12-18 DIAGNOSIS — Z5181 Encounter for therapeutic drug level monitoring: Secondary | ICD-10-CM | POA: Diagnosis not present

## 2018-12-18 LAB — POCT INR: INR: 2.6 (ref 2.0–3.0)

## 2018-12-18 NOTE — Telephone Encounter (Signed)
New message:     Patient calling stating that some one called h her. Did not see a note. Please call patient.

## 2018-12-18 NOTE — Telephone Encounter (Signed)
Pt was already called but I called them again just to reinstate 1. COVID-19 Pre-Screening Questions:  . In the past 7 to 10 days have you had a cough,  shortness of breath, headache, congestion, fever (100 or greater) body aches, chills, sore throat, or sudden loss of taste or sense of smell?  no . Have you been around anyone with known Covid 19.  no . Have you been around anyone who is awaiting Covid 19 test results in the past 7 to 10 days?  no . Have you been around anyone who has been exposed to Covid 19, or has mentioned symptoms of Covid 19 within the past 7 to 10 days?  no    2. Pt advised of visitor restrictions (no visitors allowed except if needed to conduct the visit). Also advised to arrive at appointment time and wear a mask.

## 2018-12-18 NOTE — Telephone Encounter (Signed)
Pt has [facility] appt today at 3. Not sure if [facility] called pt.

## 2018-12-18 NOTE — Patient Instructions (Signed)
Description   Continue 1.5 tablets daily.  Recheck in 4 weeks. Please call [facility]# [phone] Main # [phone] with any problems new medications or if scheduled for any procedures.

## 2018-12-26 NOTE — Telephone Encounter (Signed)
Letter sent for patient to call the office and schedule appointment.

## 2018-12-31 ENCOUNTER — Telehealth: Payer: Self-pay | Admitting: Oncology

## 2018-12-31 NOTE — Telephone Encounter (Signed)
Confirmed appt and verified info. °

## 2018-12-31 NOTE — Telephone Encounter (Signed)
Called patient regarding upcoming Webex appointment, left patient a voicemail and due to no communication to set up virtual visit this will be a telephone visit.

## 2019-01-01 ENCOUNTER — Other Ambulatory Visit: Payer: Medicare Other

## 2019-01-01 ENCOUNTER — Encounter: Payer: Self-pay | Admitting: Oncology

## 2019-01-01 ENCOUNTER — Inpatient Hospital Stay (HOSPITAL_BASED_OUTPATIENT_CLINIC_OR_DEPARTMENT_OTHER): Payer: Medicare Other | Admitting: Oncology

## 2019-01-01 ENCOUNTER — Inpatient Hospital Stay: Payer: Medicare Other | Attending: Oncology | Admitting: Oncology

## 2019-01-01 ENCOUNTER — Other Ambulatory Visit: Payer: Self-pay

## 2019-01-01 ENCOUNTER — Inpatient Hospital Stay: Payer: Medicare Other

## 2019-01-01 VITALS — BP 154/67 | HR 85 | Temp 98.6°F | Resp 18 | Wt 146.0 lb

## 2019-01-01 DIAGNOSIS — Z7901 Long term (current) use of anticoagulants: Secondary | ICD-10-CM | POA: Diagnosis not present

## 2019-01-01 DIAGNOSIS — M869 Osteomyelitis, unspecified: Secondary | ICD-10-CM

## 2019-01-01 DIAGNOSIS — Z8673 Personal history of transient ischemic attack (TIA), and cerebral infarction without residual deficits: Secondary | ICD-10-CM | POA: Insufficient documentation

## 2019-01-01 DIAGNOSIS — Z170 Estrogen receptor positive status [ER+]: Secondary | ICD-10-CM

## 2019-01-01 DIAGNOSIS — Z923 Personal history of irradiation: Secondary | ICD-10-CM | POA: Diagnosis not present

## 2019-01-01 DIAGNOSIS — Z853 Personal history of malignant neoplasm of breast: Secondary | ICD-10-CM | POA: Diagnosis not present

## 2019-01-01 DIAGNOSIS — Z79899 Other long term (current) drug therapy: Secondary | ICD-10-CM | POA: Insufficient documentation

## 2019-01-01 DIAGNOSIS — I10 Essential (primary) hypertension: Secondary | ICD-10-CM | POA: Diagnosis not present

## 2019-01-01 DIAGNOSIS — R29898 Other symptoms and signs involving the musculoskeletal system: Secondary | ICD-10-CM

## 2019-01-01 DIAGNOSIS — C50812 Malignant neoplasm of overlapping sites of left female breast: Secondary | ICD-10-CM

## 2019-01-01 DIAGNOSIS — Z96642 Presence of left artificial hip joint: Secondary | ICD-10-CM

## 2019-01-01 DIAGNOSIS — B488 Other specified mycoses: Secondary | ICD-10-CM | POA: Insufficient documentation

## 2019-01-01 LAB — CMP (CANCER CENTER ONLY)
ALT: 12 U/L (ref 0–44)
AST: 20 U/L (ref 15–41)
Albumin: 4.3 g/dL (ref 3.5–5.0)
Alkaline Phosphatase: 88 U/L (ref 38–126)
Anion gap: 7 (ref 5–15)
BUN: 24 mg/dL — ABNORMAL HIGH (ref 8–23)
CO2: 27 mmol/L (ref 22–32)
Calcium: 9.6 mg/dL (ref 8.9–10.3)
Chloride: 107 mmol/L (ref 98–111)
Creatinine: 1.11 mg/dL — ABNORMAL HIGH (ref 0.44–1.00)
GFR, Est AFR Am: 54 mL/min — ABNORMAL LOW (ref >60)
GFR, Estimated: 46 mL/min — ABNORMAL LOW (ref >60)
Glucose, Bld: 79 mg/dL (ref 70–99)
Potassium: 4 mmol/L (ref 3.5–5.1)
Sodium: 141 mmol/L (ref 135–145)
Total Bilirubin: 0.7 mg/dL (ref 0.3–1.2)
Total Protein: 7.3 g/dL (ref 6.5–8.1)

## 2019-01-01 LAB — CBC WITH DIFFERENTIAL/PLATELET
Abs Immature Granulocytes: 0.02 10*3/uL (ref 0.00–0.07)
Basophils Absolute: 0.1 10*3/uL (ref 0.0–0.1)
Basophils Relative: 2 %
Eosinophils Absolute: 0.2 10*3/uL (ref 0.0–0.5)
Eosinophils Relative: 3 %
HCT: 43.1 % (ref 36.0–46.0)
Hemoglobin: 14 g/dL (ref 12.0–15.0)
Immature Granulocytes: 0 %
Lymphocytes Relative: 22 %
Lymphs Abs: 1.6 10*3/uL (ref 0.7–4.0)
MCH: 28.8 pg (ref 26.0–34.0)
MCHC: 32.5 g/dL (ref 30.0–36.0)
MCV: 88.7 fL (ref 80.0–100.0)
Monocytes Absolute: 0.5 10*3/uL (ref 0.1–1.0)
Monocytes Relative: 7 %
Neutro Abs: 4.9 10*3/uL (ref 1.7–7.7)
Neutrophils Relative %: 66 %
Platelets: 209 10*3/uL (ref 150–400)
RBC: 4.86 MIL/uL (ref 3.87–5.11)
RDW: 14.3 % (ref 11.5–15.5)
WBC: 7.3 10*3/uL (ref 4.0–10.5)
nRBC: 0 % (ref 0.0–0.2)

## 2019-01-01 MED ORDER — KETOCONAZOLE 2 % EX CREA: 1.0000 "application " | TOPICAL_CREAM | Freq: Every day | CUTANEOUS | 0 refills | Status: DC

## 2019-01-01 NOTE — Progress Notes (Signed)
[facility]  [phone]) [phone] [phone]) [phone]     ID: LANNAH Adams DOB: [birth year]  MR#: [id number]  CSN#: [id number]  Patient Care Team: Alroy Dust, Carlean Jews.[name], MD as PCP - General (Family Medicine) Jettie Booze, MD as PCP - Cardiology (Cardiology) Bo Merino, MD as Consulting Physician (Rheumatology) Braelen Sproule, Virgie Dad, MD as Consulting Physician (Oncology) OTHER MD:  CHIEF COMPLAINT: Remote Hx of Left Breast Cancer  CURRENT TREATMENT: observation   INTERIM HISTORY: Renee Adams returns today for follow-up of her remote breast cancer. She continues under observation.   We had changed her visit to a phone only visit but for some reason she could not be contacted and she did come to see me.  By the time she came I had already written her a letter suggesting she see me next year.  Since her last visit, she has not undergone any additional studies. She is scheduled to undergo screening mammogram on 01/03/2019.   REVIEW OF SYSTEMS: Renee Adams reports arthritis, GI issues, and migraines. She has yet followed up with anyone regarding her arthritis. Her GI issues consist of spastic colon and rectal prolapse. She lives alone. Her son and daughter-in-law do the shopping for her. She does her own cooking and cleaning. She notes a friend brought her to her appointment today. A detailed review of systems was otherwise entirely negative.   BREAST CANCER HISTORY: Renee Adams was originally diagnosed with a left breast carcinoma in early 1996. Tumor was T1, N0, ER and PR positive. She underwent left lumpectomy and sentinel lymph node dissection in March of 1996.  Patient was treated with tamoxifen for 5 years, then Evista for 3 years. The Evsita was then discontinued secondary to history of stroke.   Currently she is being followed with observation alone.   PAST MEDICAL HISTORY Past Medical History:  Diagnosis Date  . ASD (atrial septal defect)   . Cancer [facility])     breast, left  . Carpal tunnel syndrome, bilateral   . Depression   . Gait disorder   . GERD (gastroesophageal reflux disease)   . Glaucoma   . Glaucoma   . History of stroke   . Hypertension   . IBS (irritable bowel syndrome)   . Personal history of radiation therapy 1996  . S/P bunionectomy    bilateral    PAST SURGICAL HISTORY Past Surgical History:  Procedure Laterality Date  . ABDOMINAL HYSTERECTOMY    . bladder resuspension    . BREAST LUMPECTOMY Left 1996  . CATARACT EXTRACTION Bilateral   . INCISION AND DRAINAGE HIP  03/31/2012   Procedure: IRRIGATION AND DEBRIDEMENT HIP WITH POLY EXCHANGE;  Surgeon: Rudean Haskell, MD;  Location: [place];  Service: Orthopedics;  Laterality: Left;  . ORIF FEMUR FRACTURE Right 11/30/2012   Procedure: OPEN REDUCTION INTERNAL FIXATION (ORIF) DISTAL FEMUR FRACTURE;  Surgeon: Vickey Huger, MD;  Location: [place];  Service: Orthopedics;  Laterality: Right;  . TOTAL HIP ARTHROPLASTY Bilateral   . TOTAL KNEE ARTHROPLASTY Bilateral     FAMILY HISTORY Family History  Problem Relation Age of Onset  . Cancer Mother   . Breast cancer Mother   . Heart failure Father   . Diabetes Sister   . Osteoarthritis Brother   . Breast cancer Maternal Aunt   . 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 She lives alone in her own home with  no pets. The patient's daughter Renee Adams  is a Materials engineer and lives in [place]. The patient's son Renee Adams is an Designer, multimedia in [place]. The patient has 4 grandchildren.    ADVANCED DIRECTIVES: She has named both her children as healthcare powers of attorney   HEALTHCARE MAINTENANCE  Current Outpatient Medications  Medication Sig Dispense Refill  . ALPRAZolam (XANAX) 0.25 MG tablet Take 4 tablets (1 mg total) by mouth every 4 (four) hours as needed for anxiety. 40 tablet 0  . amLODipine (NORVASC) 5 MG tablet Take 1 tablet (5 mg total) by mouth daily.  Please make overdue appt with Dr. Irish Lack before anymore refills. 1st attempt 30 tablet 0  . atorvastatin (LIPITOR) 10 MG tablet Take 1 tablet (10 mg total) by mouth daily. Pt needs to schedule appointment for further refills - 1st attempt. Thank you 30 tablet 0  . buPROPion (WELLBUTRIN XL) 300 MG 24 hr tablet Take 1 tablet (300 mg total) by mouth daily. 30 tablet 1  . calcium carbonate 200 MG capsule Take 3 capsules (600 mg total) by mouth 2 (two) times daily with a meal. 60 capsule 1  . Cholecalciferol (VITAMIN D-1000 MAX ST) 1000 units tablet Take by mouth.    . diclofenac sodium (VOLTAREN) 1 % GEL Apply 2 g topically 4 (four) times daily.    Marland Kitchen EPINEPHrine 0.3 mg/0.3 mL IJ SOAJ injection Inject into the muscle.    . ergocalciferol (VITAMIN D2) 50000 UNITS capsule Take 50,000 Units by mouth once a week.    . furosemide (LASIX) 40 MG tablet Take 1 tablet (40 mg total) by mouth daily as needed. 90 tablet 3  . latanoprost (XALATAN) 0.005 % ophthalmic solution Place 1 drop into both eyes at bedtime. 2.5 mL 12  . oxyCODONE (ROXICODONE) 5 MG immediate release tablet Take 0.5 tablets (2.5 mg total) by mouth every 4 (four) hours as needed for severe pain. 7 tablet 0  . rOPINIRole (REQUIP) 1 MG tablet Take 1 tablet (1 mg total) by mouth at bedtime as needed. For restless legs 30 tablet 0  . sertraline (ZOLOFT) 100 MG tablet Take 1.5 tablets (150 mg total) by mouth daily. 30 tablet 1  . traMADol (ULTRAM) 50 MG tablet Take 50 mg by mouth 2 (two) times daily.    Marland Kitchen warfarin (COUMADIN) 2 MG tablet TAKE AS DIRECTED BY COUMADIN CLINIC 45 tablet 3   No current facility-administered medications for this visit.     Allergies:  Allergies  Allergen Reactions  . Dilaudid [Hydromorphone Hcl] Shortness Of Breath    Tolerates tramadol  . Hydromorphone Other (See Comments)    UNKNOWN TO PATIENT  . Morphine Other (See Comments)    UNKNOWN TO PATIENT  . Morphine And Related Shortness Of Breath    Tolerates  tramadol  . Sulfamethoxazole Other (See Comments)    UNKNOWN TO PATIENT  . Sulfa Antibiotics Other (See Comments)    "Made me drunk";  wobbly  . Keflex [Cephalexin] Nausea Only  . Levaquin [Levofloxacin] Hives and Rash    Physical Exam: Elderly white woman using a chair walker Vitals:   01/01/19 1438  BP: (!) 154/67  Pulse: 85  Resp: 18  Temp: 98.6 F (37 C)  SpO2: (!) 85%    Body mass index is 25.46 kg/m.  ECOG:  2 Filed Weights   01/01/19 1438  Weight: 146 lb (66.2 kg)   Sclerae unicteric, EOMs intact Wearing a mask No cervical or supraclavicular adenopathy Lungs no rales or rhonchi Heart regular rate and rhythm Abd  soft, nontender, positive bowel sounds MSK kyphosis but no focal spinal tenderness, the patient's hands are imaged below Neuro: nonfocal, well oriented, appropriate affect Breasts: The right breast is benign per the left breast is status post lumpectomy.  There is a moderate rash in the left inframammary fold.  Both axillae are benign.   01/01/2019: Patient's hands     Lab Results: Lab Results  Component Value Date   WBC 6.0 12/31/2017   HGB 13.3 12/31/2017   HCT 40.2 12/31/2017   MCV 88.4 12/31/2017   PLT 198 12/31/2017   NEUTROABS 3.3 12/31/2017     Chemistry      Component Value Date/Time   NA 141 12/31/2017 1246   NA 142 10/11/2016 1242   K 4.3 12/31/2017 1246   K 4.4 10/11/2016 1242   CL 110 12/31/2017 1246   CL 109 (H) 09/10/2012 1315   CO2 25 12/31/2017 1246   CO2 24 10/11/2016 1242   BUN 17 12/31/2017 1246   BUN 15.6 10/11/2016 1242   CREATININE 0.83 12/31/2017 1246   CREATININE 0.9 10/11/2016 1242      Component Value Date/Time   CALCIUM 9.1 12/31/2017 1246   CALCIUM 9.8 10/11/2016 1242   ALKPHOS 94 12/31/2017 1246   ALKPHOS 103 10/11/2016 1242   AST 17 12/31/2017 1246   AST 20 10/11/2016 1242   ALT 9 12/31/2017 1246   ALT 10 10/11/2016 1242   BILITOT 0.4 12/31/2017 1246   BILITOT 0.78 10/11/2016 1242      STUDIES:  Since her last visit, she underwent screening bilateral mammography with CAD and tomography on 11/26/2017 at [facility] showing: breast density category B. There was no evidence of malignancy.    Assessment:  85 y.o. [place] woman   (1)  status post left lumpectomy and sentinel lymph node dissection March 1996 for a T1, N0, ER/PR positive breast carcinoma.    (2)  Treated with tamoxifen for five years, then Evista for three years with the Evista discontinued secondary to stroke.    (3)  Currently being followed with observation alone.   Plan:  Renee Adams is now 24 years out from definitive surgery for her breast cancer with no evidence of disease recurrence.  This is very favorable.  She is scheduled for mammography later this week.  I do not anticipate any surprises there.  I am writing her a cream for her fungal rash.  She will let me know if it does not work.  I am placing a referral to rheumatology with Dr. Trudie Reed for further evaluation and treatment of the patient's arthritis  Otherwise Maziyah will return to see me in 1 year.  She knows to call for any other issues that may develop before then.   Milli Woolridge, Virgie Dad, MD  01/01/19 3:02 PM Medical Oncology and Hematology [facility] [address] [place], [place] [address] Tel. [phone]    Fax. [phone]   I, Wilburn Mylar, am acting as scribe for Dr. Virgie Dad. Jentri Aye.  I, Lurline Del MD, have reviewed the above documentation for accuracy and completeness, and I agree with the above.

## 2019-01-01 NOTE — Progress Notes (Signed)
Unable to reach patient by phone as scheduled; left message; rescheduled for next year

## 2019-01-01 NOTE — Addendum Note (Signed)
Addended by: Chauncey Cruel on: 01/01/2019 03:58 PM   Modules accepted: Orders

## 2019-01-02 ENCOUNTER — Telehealth: Payer: Self-pay | Admitting: Oncology

## 2019-01-02 NOTE — Telephone Encounter (Signed)
I left a message regarding schedule  

## 2019-01-03 ENCOUNTER — Ambulatory Visit: Payer: Medicare Other

## 2019-01-05 ENCOUNTER — Other Ambulatory Visit: Payer: Self-pay | Admitting: *Deleted

## 2019-01-05 MED ORDER — KETOCONAZOLE 2 % EX CREA: 1.0000 "application " | TOPICAL_CREAM | Freq: Every day | CUTANEOUS | 0 refills | Status: DC

## 2019-01-06 ENCOUNTER — Telehealth: Payer: Self-pay | Admitting: Interventional Cardiology

## 2019-01-06 NOTE — Progress Notes (Signed)
Virtual Visit via Video Note   This visit type was conducted due to national recommendations for restrictions regarding the COVID-19 Pandemic (e.g. social distancing) in an effort to limit this patient's exposure and mitigate transmission in our community.  Due to her co-morbid illnesses, this patient is at least at moderate risk for complications without adequate follow up.  This format is felt to be most appropriate for this patient at this time.  All issues noted in this document were discussed and addressed.  A limited physical exam was performed with this format.  Please refer to the patient's chart for her consent to telehealth for Renee Adams.   Patient unable to access camera Date:  01/07/2019   ID:  Renee Adams, DOB 1934-02-05, MRN [id number]  Patient Location: Home Provider Location: Home  PCP:  Alroy Dust, Carlean Jews.[name], MD  Cardiologist:  Larae Grooms, MD  Electrophysiologist:  None   Evaluation Performed:  Follow-Up Visit  Chief Complaint:    History of Present Illness:    Renee Adams is a 85 y.o. female with prior CVA- possible PFO from prior echo:  Long term Coumadin for stroke prevention.  Walking limited by arthritis.  Denies : Chest pain. Dizziness. Leg edema. Nitroglycerin use. Orthopnea. Palpitations. Paroxysmal nocturnal dyspnea. Shortness of breath. Syncope.   The patient does not have symptoms concerning for COVID-19 infection (fever, chills, cough, or new shortness of breath).    Past Medical History:  Diagnosis Date  . ASD (atrial septal defect)   . Cancer [facility])    breast, left  . Carpal tunnel syndrome, bilateral   . Depression   . Gait disorder   . GERD (gastroesophageal reflux disease)   . Glaucoma   . Glaucoma   . History of stroke   . Hypertension   . IBS (irritable bowel syndrome)   . Personal history of radiation therapy 1996  . S/P bunionectomy    bilateral   Past Surgical History:  Procedure Laterality Date  . ABDOMINAL  HYSTERECTOMY    . bladder resuspension    . BREAST LUMPECTOMY Left 1996  . CATARACT EXTRACTION Bilateral   . INCISION AND DRAINAGE HIP  03/31/2012   Procedure: IRRIGATION AND DEBRIDEMENT HIP WITH POLY EXCHANGE;  Surgeon: Rudean Haskell, MD;  Location: [place];  Service: Orthopedics;  Laterality: Left;  . ORIF FEMUR FRACTURE Right 11/30/2012   Procedure: OPEN REDUCTION INTERNAL FIXATION (ORIF) DISTAL FEMUR FRACTURE;  Surgeon: Vickey Huger, MD;  Location: [facility];  Service: Orthopedics;  Laterality: Right;  . TOTAL HIP ARTHROPLASTY Bilateral   . TOTAL KNEE ARTHROPLASTY Bilateral      Current Meds  Medication Sig  . ALPRAZolam (XANAX) 0.25 MG tablet Take 4 tablets (1 mg total) by mouth every 4 (four) hours as needed for anxiety.  Marland Kitchen amLODipine (NORVASC) 2.5 MG tablet Take 2.5 mg by mouth daily.  Marland Kitchen atorvastatin (LIPITOR) 10 MG tablet Take 1 tablet (10 mg total) by mouth daily. Pt needs to schedule appointment for further refills - 1st attempt. Thank you  . buPROPion (WELLBUTRIN XL) 300 MG 24 hr tablet Take 1 tablet (300 mg total) by mouth daily.  . calcium carbonate 200 MG capsule Take 3 capsules (600 mg total) by mouth 2 (two) times daily with a meal.  . Cholecalciferol (VITAMIN D-1000 MAX ST) 1000 units tablet Take by mouth.  . diclofenac sodium (VOLTAREN) 1 % GEL Apply 2 g topically 4 (four) times daily.  Marland Kitchen EPINEPHrine 0.3 mg/0.3 mL IJ SOAJ injection Inject  into the muscle.  . ergocalciferol (VITAMIN D2) 50000 UNITS capsule Take 50,000 Units by mouth once a week.  . furosemide (LASIX) 40 MG tablet Take 1 tablet (40 mg total) by mouth daily as needed.  Marland Kitchen ketoconazole (NIZORAL) 2 % cream Apply 1 application topically daily.  Marland Kitchen latanoprost (XALATAN) 0.005 % ophthalmic solution Place 1 drop into both eyes at bedtime.  Marland Kitchen rOPINIRole (REQUIP) 1 MG tablet Take 1 tablet (1 mg total) by mouth at bedtime as needed. For restless legs  . sertraline (ZOLOFT) 100 MG tablet Take 1.5 tablets (150 mg total) by  mouth daily.  . traMADol (ULTRAM) 50 MG tablet Take 50 mg by mouth 2 (two) times daily.  Marland Kitchen warfarin (COUMADIN) 2 MG tablet TAKE AS DIRECTED BY COUMADIN CLINIC     Allergies:   Dilaudid [hydromorphone hcl], Hydromorphone, Morphine, Morphine and related, Sulfamethoxazole, Sulfa antibiotics, Keflex [cephalexin], and Levaquin [levofloxacin]   Social History   Tobacco Use  . Smoking status: Never Smoker  . Smokeless tobacco: Never Used  Substance Use Topics  . Alcohol use: No  . Drug use: No     Family Hx: The patient's family history includes Breast cancer in her maternal aunt and mother; Cancer in her mother; Diabetes in her sister; Heart failure in her father; Osteoarthritis in her brother. There is no history of Heart attack.  ROS:   Please see the history of present illness.    Joint pains All other systems reviewed and are negative.   Prior CV studies:   The following studies were reviewed today: Prior ECG reviewed   Labs/Other Tests and Data Reviewed:    EKG:  An ECG dated 2018 was personally reviewed today and demonstrated:  NSR, no ST segment changes  Recent Labs: 01/01/2019: ALT 12; BUN 24; Creatinine 1.11; Hemoglobin 14.0; Platelets 209; Potassium 4.0; Sodium 141   Recent Lipid Panel Lab Results  Component Value Date/Time   CHOL 202 (H) 01/11/2014 10:22 AM   TRIG 156.0 (H) 01/11/2014 10:22 AM   HDL 49.70 01/11/2014 10:22 AM   CHOLHDL 4 01/11/2014 10:22 AM   LDLCALC 121 (H) 01/11/2014 10:22 AM    Wt Readings from Last 3 Encounters:  01/07/19 146 lb (66.2 kg)  01/01/19 146 lb (66.2 kg)  12/31/17 147 lb 9.6 oz (67 kg)     Objective:    Vital Signs:  BP 140/90   Pulse 81   Ht 5' 3.5" (1.613 m)   Wt 146 lb (66.2 kg)   BMI 25.46 kg/m    VITAL SIGNS:  reviewed GEN:  no acute distress RESPIRATORY:  no shortness of breath PSYCH:  normal affect exam limited  ASSESSMENT & PLAN:    1. Prior CVA: Coumadin.  Hbg normal.   No bleeding issues.  COntinue  anticoagulation at this time.  2. Hyperlipidemia: LDL 83 in 2019.  Not checked on most recent labs.  LFTs normal in 2020.  COntinue atorvastatin.   3. HTN: The current medical regimen is effective;  continue present plan and medications. 4. LE edema: Sx controlled.   COVID-19 Education: The signs and symptoms of COVID-19 were discussed with the patient and how to seek care for testing (follow up with PCP or arrange E-visit).  The importance of social distancing was discussed today.  Time:   Today, I have spent 15 minutes with the patient with telehealth technology discussing the above problems.     Medication Adjustments/Labs and Tests Ordered: Current medicines are reviewed at length with the  patient today.  Concerns regarding medicines are outlined above.   Tests Ordered: No orders of the defined types were placed in this encounter.   Medication Changes: No orders of the defined types were placed in this encounter.   Follow Up:  Virtual Visit or In Person in 1 year(s)  Signed, Larae Grooms, MD  01/07/2019 9:18 AM    [facility]

## 2019-01-06 NOTE — Telephone Encounter (Signed)
Not sure who called the pt. I do not see any phone notes. I do see pt has appt 7/9 with [facility]. I will route to [facility] as they may have called the pt.

## 2019-01-06 NOTE — Telephone Encounter (Signed)
Spoke with patient and scheduled overdue f/u appt with JV. Patient will have a phone visit with [facility] tomorrow.      Virtual Visit Pre-Appointment Phone Call  TELEPHONE CALL NOTE  Renee Adams has been deemed a candidate for a follow-up tele-health visit to limit community exposure during the Covid-19 pandemic. I spoke with the patient via phone to ensure availability of phone/video source, confirm preferred email & phone number, and discuss instructions and expectations.  I reminded Renee Adams to be prepared with any vital sign and/or heart rhythm information that could potentially be obtained via home monitoring, at the time of her visit. I reminded Renee Adams to expect a phone call prior to her visit.  Patient agrees to consent below.   Cleon Gustin, RN 01/06/2019 3:10 PM  FULL LENGTH CONSENT FOR TELE-HEALTH VISIT   I hereby voluntarily request, consent and authorize [facility] and its employed or contracted physicians, physician assistants, nurse practitioners or other licensed health care professionals (the Practitioner), to provide me with telemedicine health care services (the "Services") as deemed necessary by the treating Practitioner. I acknowledge and consent to receive the Services by the Practitioner via telemedicine. I understand that the telemedicine visit will involve communicating with the Practitioner through live audiovisual communication technology and the disclosure of certain medical information by electronic transmission. I acknowledge that I have been given the opportunity to request an in-person assessment or other available alternative prior to the telemedicine visit and am voluntarily participating in the telemedicine visit.  I understand that I have the right to withhold or withdraw my consent to the use of telemedicine in the course of my care at any time, without affecting my right to future care or treatment, and that the Practitioner or I may  terminate the telemedicine visit at any time. I understand that I have the right to inspect all information obtained and/or recorded in the course of the telemedicine visit and may receive copies of available information for a reasonable fee.  I understand that some of the potential risks of receiving the Services via telemedicine include:  Marland Kitchen Delay or interruption in medical evaluation due to technological equipment failure or disruption; . Information transmitted may not be sufficient (e.g. poor resolution of images) to allow for appropriate medical decision making by the Practitioner; and/or  . In rare instances, security protocols could fail, causing a breach of personal health information.  Furthermore, I acknowledge that it is my responsibility to provide information about my medical history, conditions and care that is complete and accurate to the best of my ability. I acknowledge that Practitioner's advice, recommendations, and/or decision may be based on factors not within their control, such as incomplete or inaccurate data provided by me or distortions of diagnostic images or specimens that may result from electronic transmissions. I understand that the practice of medicine is not an exact science and that Practitioner makes no warranties or guarantees regarding treatment outcomes. I acknowledge that I will receive a copy of this consent concurrently upon execution via email to the email address I last provided but may also request a printed copy by calling the office of [facility].    I understand that my insurance will be billed for this visit.   I have read or had this consent read to me. . I understand the contents of this consent, which adequately explains the benefits and risks of the Services being provided via telemedicine.  . I have been provided  ample opportunity to ask questions regarding this consent and the Services and have had my questions answered to my satisfaction. . I  give my informed consent for the services to be provided through the use of telemedicine in my medical care  By participating in this telemedicine visit I agree to the above.

## 2019-01-06 NOTE — Telephone Encounter (Signed)
  Patient states that she is returning a call to the nurse regarding an appointment and some questions she had.

## 2019-01-07 ENCOUNTER — Encounter

## 2019-01-07 ENCOUNTER — Telehealth: Payer: Self-pay | Admitting: Oncology

## 2019-01-07 ENCOUNTER — Encounter: Payer: Self-pay | Admitting: Interventional Cardiology

## 2019-01-07 ENCOUNTER — Other Ambulatory Visit: Payer: Self-pay

## 2019-01-07 ENCOUNTER — Telehealth (INDEPENDENT_AMBULATORY_CARE_PROVIDER_SITE_OTHER): Payer: Medicare Other | Admitting: Interventional Cardiology

## 2019-01-07 VITALS — BP 140/90 | HR 81 | Ht 63.5 in | Wt 146.0 lb

## 2019-01-07 DIAGNOSIS — E782 Mixed hyperlipidemia: Secondary | ICD-10-CM

## 2019-01-07 DIAGNOSIS — Z7901 Long term (current) use of anticoagulants: Secondary | ICD-10-CM | POA: Diagnosis not present

## 2019-01-07 DIAGNOSIS — Q211 Atrial septal defect, unspecified: Secondary | ICD-10-CM

## 2019-01-07 DIAGNOSIS — I6789 Other cerebrovascular disease: Secondary | ICD-10-CM | POA: Diagnosis not present

## 2019-01-07 DIAGNOSIS — I10 Essential (primary) hypertension: Secondary | ICD-10-CM

## 2019-01-07 DIAGNOSIS — R600 Localized edema: Secondary | ICD-10-CM

## 2019-01-07 NOTE — Patient Instructions (Signed)

## 2019-01-07 NOTE — Telephone Encounter (Signed)
FAXED RECORDS TO [facility]

## 2019-01-08 ENCOUNTER — Telehealth: Payer: Self-pay

## 2019-01-08 NOTE — Telephone Encounter (Signed)

## 2019-01-15 ENCOUNTER — Ambulatory Visit (INDEPENDENT_AMBULATORY_CARE_PROVIDER_SITE_OTHER): Payer: Medicare Other | Admitting: *Deleted

## 2019-01-15 ENCOUNTER — Other Ambulatory Visit: Payer: Self-pay

## 2019-01-15 DIAGNOSIS — I6789 Other cerebrovascular disease: Secondary | ICD-10-CM

## 2019-01-15 DIAGNOSIS — Z5181 Encounter for therapeutic drug level monitoring: Secondary | ICD-10-CM

## 2019-01-15 LAB — POCT INR: INR: 3.9 — AB (ref 2.0–3.0)

## 2019-01-15 NOTE — Patient Instructions (Signed)
Description   Do not take any Coumadin today then continue 1.5 tablets daily.  Recheck in 3 weeks. Please call Coumadin Clinic# [phone] Main # [phone] with any problems new medications or if scheduled for any procedures.

## 2019-01-26 ENCOUNTER — Other Ambulatory Visit: Payer: Self-pay | Admitting: Interventional Cardiology

## 2019-02-10 ENCOUNTER — Other Ambulatory Visit: Payer: Self-pay

## 2019-02-10 ENCOUNTER — Ambulatory Visit (INDEPENDENT_AMBULATORY_CARE_PROVIDER_SITE_OTHER): Payer: Medicare Other

## 2019-02-10 DIAGNOSIS — I6789 Other cerebrovascular disease: Secondary | ICD-10-CM | POA: Diagnosis not present

## 2019-02-10 DIAGNOSIS — Z5181 Encounter for therapeutic drug level monitoring: Secondary | ICD-10-CM | POA: Diagnosis not present

## 2019-02-10 LAB — POCT INR: INR: 1.4 — AB (ref 2.0–3.0)

## 2019-02-10 NOTE — Patient Instructions (Signed)
Description   Take 2 tablets today and tomorrow, then resume same dosage 1.5 tablets daily.  Recheck in 10 days. Please call Coumadin Clinic# [phone] Main # [phone] with any problems new medications or if scheduled for any procedures.

## 2019-02-17 ENCOUNTER — Ambulatory Visit: Payer: Medicare Other

## 2019-02-25 DIAGNOSIS — Z79899 Other long term (current) drug therapy: Secondary | ICD-10-CM | POA: Diagnosis not present

## 2019-02-25 DIAGNOSIS — M2550 Pain in unspecified joint: Secondary | ICD-10-CM | POA: Diagnosis not present

## 2019-02-25 DIAGNOSIS — M150 Primary generalized (osteo)arthritis: Secondary | ICD-10-CM | POA: Diagnosis not present

## 2019-02-25 DIAGNOSIS — M720 Palmar fascial fibromatosis [Dupuytren]: Secondary | ICD-10-CM | POA: Diagnosis not present

## 2019-02-25 DIAGNOSIS — Z6826 Body mass index (BMI) 26.0-26.9, adult: Secondary | ICD-10-CM | POA: Diagnosis not present

## 2019-02-25 DIAGNOSIS — E663 Overweight: Secondary | ICD-10-CM | POA: Diagnosis not present

## 2019-02-25 DIAGNOSIS — G2581 Restless legs syndrome: Secondary | ICD-10-CM | POA: Diagnosis not present

## 2019-02-27 ENCOUNTER — Other Ambulatory Visit: Payer: Self-pay

## 2019-02-27 ENCOUNTER — Ambulatory Visit (INDEPENDENT_AMBULATORY_CARE_PROVIDER_SITE_OTHER): Payer: Medicare Other | Admitting: *Deleted

## 2019-02-27 DIAGNOSIS — I6789 Other cerebrovascular disease: Secondary | ICD-10-CM

## 2019-02-27 DIAGNOSIS — Z5181 Encounter for therapeutic drug level monitoring: Secondary | ICD-10-CM

## 2019-02-27 LAB — POCT INR: INR: 3.2 — AB (ref 2.0–3.0)

## 2019-02-27 NOTE — Patient Instructions (Signed)
Description   Today take 1 tablet then resume same dosage 1.5 tablets daily.  Recheck in 2 weeks. Please call Coumadin Clinic# [phone] Main # [phone] with any problems new medications or if scheduled for any procedures.

## 2019-03-11 ENCOUNTER — Other Ambulatory Visit: Payer: Self-pay

## 2019-03-11 ENCOUNTER — Ambulatory Visit (INDEPENDENT_AMBULATORY_CARE_PROVIDER_SITE_OTHER): Payer: Medicare Other

## 2019-03-11 DIAGNOSIS — Z5181 Encounter for therapeutic drug level monitoring: Secondary | ICD-10-CM

## 2019-03-11 DIAGNOSIS — I6789 Other cerebrovascular disease: Secondary | ICD-10-CM | POA: Diagnosis not present

## 2019-03-11 LAB — POCT INR: INR: 3.2 — AB (ref 2.0–3.0)

## 2019-03-11 NOTE — Patient Instructions (Signed)
Description   Skip today's dosage of Coumadin, then resume same dosage 1.5 tablets daily.  Recheck in 3 weeks. Please call Coumadin Clinic# [phone] Main # [phone] with any problems new medications or if scheduled for any procedures.

## 2019-03-13 ENCOUNTER — Ambulatory Visit: Payer: Medicare Other | Admitting: Interventional Cardiology

## 2019-03-19 DIAGNOSIS — M720 Palmar fascial fibromatosis [Dupuytren]: Secondary | ICD-10-CM | POA: Diagnosis not present

## 2019-03-19 DIAGNOSIS — M2550 Pain in unspecified joint: Secondary | ICD-10-CM | POA: Diagnosis not present

## 2019-03-19 DIAGNOSIS — M150 Primary generalized (osteo)arthritis: Secondary | ICD-10-CM | POA: Diagnosis not present

## 2019-04-01 ENCOUNTER — Ambulatory Visit (INDEPENDENT_AMBULATORY_CARE_PROVIDER_SITE_OTHER): Payer: Medicare Other | Admitting: *Deleted

## 2019-04-01 ENCOUNTER — Other Ambulatory Visit: Payer: Self-pay

## 2019-04-01 DIAGNOSIS — I6789 Other cerebrovascular disease: Secondary | ICD-10-CM

## 2019-04-01 DIAGNOSIS — Z5181 Encounter for therapeutic drug level monitoring: Secondary | ICD-10-CM | POA: Diagnosis not present

## 2019-04-01 LAB — POCT INR: INR: 3.1 — AB (ref 2.0–3.0)

## 2019-04-01 NOTE — Patient Instructions (Signed)
Description    Take 1/2 a tablet today, then start taking 1.5 tablet daily except for 1 tablet on Wednesday. Recheck in 3 weeks. Please call Coumadin Clinic# [phone] Main # [phone] with any problems new medications or if scheduled for any procedures.

## 2019-04-15 ENCOUNTER — Ambulatory Visit: Payer: Medicare Other

## 2019-04-17 ENCOUNTER — Other Ambulatory Visit: Payer: Self-pay | Admitting: Interventional Cardiology

## 2019-04-22 ENCOUNTER — Ambulatory Visit (INDEPENDENT_AMBULATORY_CARE_PROVIDER_SITE_OTHER): Payer: Medicare Other | Admitting: *Deleted

## 2019-04-22 ENCOUNTER — Other Ambulatory Visit: Payer: Self-pay

## 2019-04-22 DIAGNOSIS — Z5181 Encounter for therapeutic drug level monitoring: Secondary | ICD-10-CM

## 2019-04-22 DIAGNOSIS — I6789 Other cerebrovascular disease: Secondary | ICD-10-CM | POA: Diagnosis not present

## 2019-04-22 LAB — POCT INR: INR: 3.8 — AB (ref 2.0–3.0)

## 2019-04-22 NOTE — Patient Instructions (Signed)
Description   Do not take any Warfarin today then start taking 1.5 tablet daily except for 1 tablet on Wednesdays and Sundays. Recheck in 3 weeks. Please call Coumadin Clinic# [phone] Main # [phone] with any problems new medications or if scheduled for any procedures.

## 2019-04-24 DIAGNOSIS — L820 Inflamed seborrheic keratosis: Secondary | ICD-10-CM | POA: Diagnosis not present

## 2019-05-13 ENCOUNTER — Other Ambulatory Visit: Payer: Self-pay

## 2019-05-13 ENCOUNTER — Ambulatory Visit (INDEPENDENT_AMBULATORY_CARE_PROVIDER_SITE_OTHER): Payer: Medicare Other | Admitting: *Deleted

## 2019-05-13 DIAGNOSIS — Z5181 Encounter for therapeutic drug level monitoring: Secondary | ICD-10-CM | POA: Diagnosis not present

## 2019-05-13 DIAGNOSIS — I6789 Other cerebrovascular disease: Secondary | ICD-10-CM

## 2019-05-13 LAB — POCT INR: INR: 7.7 — AB (ref 2.0–3.0)

## 2019-05-13 LAB — PROTIME-INR
INR: 7.9 (ref 0.9–1.2)
Prothrombin Time: 84 s — ABNORMAL HIGH (ref 9.1–12.0)

## 2019-05-14 NOTE — Patient Instructions (Addendum)
Description   11/4@ 355pm: LMOVM to call back. 05/14/2019@ 13:20, Spoke to pt instructed pt not take any Warfarin today, No Warfarin tomorrow, No Warfarin Friday, No Warfarin Saturday, take 2mg  on Sunday and report to have your INR checked on Monday. Report to the ER with any bleeding, falls, or accidents.  Normal dose: 1.5 tablet daily except for 1 tablet on Wednesdays and Sundays. Please call Coumadin Clinic# [phone] Main # [phone] with any problems new medications or if scheduled for any procedures.

## 2019-05-18 ENCOUNTER — Ambulatory Visit (INDEPENDENT_AMBULATORY_CARE_PROVIDER_SITE_OTHER): Payer: Medicare Other | Admitting: *Deleted

## 2019-05-18 ENCOUNTER — Other Ambulatory Visit: Payer: Self-pay

## 2019-05-18 DIAGNOSIS — Z5181 Encounter for therapeutic drug level monitoring: Secondary | ICD-10-CM

## 2019-05-18 DIAGNOSIS — I6789 Other cerebrovascular disease: Secondary | ICD-10-CM

## 2019-05-18 LAB — POCT INR: INR: 1.3 — AB (ref 2.0–3.0)

## 2019-05-18 NOTE — Patient Instructions (Signed)
Description   Take 2 tablets today and tomorrow, then start taking 1 tablet daily except for 1.5 tablets on Tuesday, Thursday and Saturday. Recheck INR in 1 week.  Please call Coumadin Clinic# [phone] Main # [phone] with any problems new medications or if scheduled for any procedures.

## 2019-05-25 ENCOUNTER — Other Ambulatory Visit: Payer: Self-pay

## 2019-05-25 ENCOUNTER — Ambulatory Visit (INDEPENDENT_AMBULATORY_CARE_PROVIDER_SITE_OTHER): Payer: Medicare Other | Admitting: *Deleted

## 2019-05-25 DIAGNOSIS — Z5181 Encounter for therapeutic drug level monitoring: Secondary | ICD-10-CM

## 2019-05-25 DIAGNOSIS — I6789 Other cerebrovascular disease: Secondary | ICD-10-CM

## 2019-05-25 LAB — POCT INR: INR: 2.4 (ref 2.0–3.0)

## 2019-05-25 NOTE — Patient Instructions (Signed)
Description   Continue taking 1 tablet daily except for 1.5 tablets on Tuesday, Thursday and Saturday. Recheck INR in 2 weeks.  Please call [facility]# [phone] Main # [phone] with any problems new medications or if scheduled for any procedures.

## 2019-05-29 ENCOUNTER — Other Ambulatory Visit: Payer: Self-pay

## 2019-05-29 ENCOUNTER — Ambulatory Visit
Admission: RE | Admit: 2019-05-29 | Discharge: 2019-05-29 | Disposition: A | Discharge status: Home / Self Care | Payer: Medicare Other | Source: Ambulatory Visit | Attending: Family Medicine | Admitting: Family Medicine

## 2019-05-29 DIAGNOSIS — Z1231 Encounter for screening mammogram for malignant neoplasm of breast: Secondary | ICD-10-CM

## 2019-06-03 ENCOUNTER — Other Ambulatory Visit: Payer: Self-pay | Admitting: Family Medicine

## 2019-06-03 DIAGNOSIS — R928 Other abnormal and inconclusive findings on diagnostic imaging of breast: Secondary | ICD-10-CM

## 2019-06-11 ENCOUNTER — Other Ambulatory Visit: Payer: Self-pay

## 2019-06-11 ENCOUNTER — Ambulatory Visit
Admission: RE | Admit: 2019-06-11 | Discharge: 2019-06-11 | Disposition: A | Discharge status: Home / Self Care | Payer: Medicare Other | Source: Ambulatory Visit | Attending: Family Medicine | Admitting: Family Medicine

## 2019-06-11 ENCOUNTER — Other Ambulatory Visit: Payer: Self-pay | Admitting: Family Medicine

## 2019-06-11 DIAGNOSIS — R928 Other abnormal and inconclusive findings on diagnostic imaging of breast: Secondary | ICD-10-CM | POA: Diagnosis not present

## 2019-06-11 DIAGNOSIS — N6312 Unspecified lump in the right breast, upper inner quadrant: Secondary | ICD-10-CM | POA: Diagnosis not present

## 2019-06-11 DIAGNOSIS — N6310 Unspecified lump in the right breast, unspecified quadrant: Secondary | ICD-10-CM

## 2019-06-17 ENCOUNTER — Other Ambulatory Visit: Payer: Self-pay

## 2019-06-17 ENCOUNTER — Ambulatory Visit
Admission: RE | Admit: 2019-06-17 | Discharge: 2019-06-17 | Disposition: A | Discharge status: Home / Self Care | Payer: Medicare Other | Source: Ambulatory Visit | Attending: Family Medicine | Admitting: Family Medicine

## 2019-06-17 DIAGNOSIS — N6011 Diffuse cystic mastopathy of right breast: Secondary | ICD-10-CM | POA: Diagnosis not present

## 2019-06-17 DIAGNOSIS — N6312 Unspecified lump in the right breast, upper inner quadrant: Secondary | ICD-10-CM | POA: Diagnosis not present

## 2019-06-17 DIAGNOSIS — N6310 Unspecified lump in the right breast, unspecified quadrant: Secondary | ICD-10-CM

## 2019-07-20 ENCOUNTER — Other Ambulatory Visit: Payer: Self-pay

## 2019-07-20 ENCOUNTER — Ambulatory Visit (INDEPENDENT_AMBULATORY_CARE_PROVIDER_SITE_OTHER): Payer: Medicare Other | Admitting: *Deleted

## 2019-07-20 DIAGNOSIS — I6789 Other cerebrovascular disease: Secondary | ICD-10-CM | POA: Diagnosis not present

## 2019-07-20 DIAGNOSIS — Z5181 Encounter for therapeutic drug level monitoring: Secondary | ICD-10-CM | POA: Diagnosis not present

## 2019-07-20 LAB — POCT INR: INR: 1.2 — AB (ref 2.0–3.0)

## 2019-07-20 NOTE — Patient Instructions (Addendum)
Description   Today take 1.5 tablets and tomorrow take 2 tablets then continue taking 1 tablet daily except for 1.5 tablets on Tuesday, Thursday and Saturday. Recheck INR in 1 week.  Please call [facility]# [phone] Main # [phone] with any problems new medications or if scheduled for any procedures.

## 2019-08-14 ENCOUNTER — Ambulatory Visit (INDEPENDENT_AMBULATORY_CARE_PROVIDER_SITE_OTHER): Payer: Medicare Other | Admitting: *Deleted

## 2019-08-14 ENCOUNTER — Other Ambulatory Visit: Payer: Self-pay

## 2019-08-14 DIAGNOSIS — I6789 Other cerebrovascular disease: Secondary | ICD-10-CM

## 2019-08-14 DIAGNOSIS — Z5181 Encounter for therapeutic drug level monitoring: Secondary | ICD-10-CM

## 2019-08-14 LAB — POCT INR: INR: 1.8 — AB (ref 2.0–3.0)

## 2019-08-14 NOTE — Patient Instructions (Addendum)
Description   Today take 1.5 tablets then start taking 1.5 tablets daily except for 1 tablet on Monday, Wednesday, and Friday. Recheck INR in 3 weeks. [facility]# [phone] Main # [phone] with any problems new medications or if scheduled for any procedures.

## 2019-09-01 ENCOUNTER — Other Ambulatory Visit: Payer: Self-pay | Admitting: Oncology

## 2019-09-04 ENCOUNTER — Other Ambulatory Visit: Payer: Self-pay

## 2019-09-04 ENCOUNTER — Ambulatory Visit (INDEPENDENT_AMBULATORY_CARE_PROVIDER_SITE_OTHER): Payer: Medicare Other

## 2019-09-04 DIAGNOSIS — I6789 Other cerebrovascular disease: Secondary | ICD-10-CM

## 2019-09-04 DIAGNOSIS — Z5181 Encounter for therapeutic drug level monitoring: Secondary | ICD-10-CM | POA: Diagnosis not present

## 2019-09-04 LAB — POCT INR: INR: 3.3 — AB (ref 2.0–3.0)

## 2019-09-04 NOTE — Patient Instructions (Signed)
Description   Skip today's dosage of Coumadin, then resume same dosage 1.5 tablets daily except for 1 tablet on Mondays, Wednesdays, and Fridays. Recheck INR in 3 weeks. [facility]# [phone] Main # [phone] with any problems new medications or if scheduled for any procedures.

## 2019-09-06 ENCOUNTER — Ambulatory Visit: Payer: Medicare Other | Attending: Internal Medicine

## 2019-09-06 DIAGNOSIS — Z23 Encounter for immunization: Secondary | ICD-10-CM | POA: Insufficient documentation

## 2019-09-06 NOTE — Progress Notes (Signed)
   Covid-19 Vaccination Clinic  Name:  GRADY VILLADA    MRN: [id number] DOB: May 30, 1934  09/06/2019  Ms. Krajnik was observed post Covid-19 immunization for 30 minutes based on pre-vaccination screening without incidence. She was provided with Vaccine Information Sheet and instruction to access the V-Safe system.   Ms. Feurtado was instructed to call [phone] with any severe reactions post vaccine: Marland Kitchen Difficulty breathing  . Swelling of your face and throat  . A fast heartbeat  . A bad rash all over your body  . Dizziness and weakness    Immunizations Administered    Name Date Dose VIS Date Route   Pfizer COVID-19 Vaccine 09/06/2019  1:12 PM 0.3 mL 06/19/2019 Intramuscular   Manufacturer: [place]   Lot: [id number]   [place]: [id number]

## 2019-09-22 ENCOUNTER — Telehealth: Payer: Self-pay | Admitting: Pharmacist

## 2019-09-22 NOTE — Telephone Encounter (Signed)
Patient called wanting to know if her COVID shot would have affected her coumadin dosage. Advised that it would not have affected her INR/coumadin dosing.

## 2019-09-25 ENCOUNTER — Other Ambulatory Visit: Payer: Self-pay

## 2019-09-25 ENCOUNTER — Ambulatory Visit (INDEPENDENT_AMBULATORY_CARE_PROVIDER_SITE_OTHER): Payer: Medicare Other | Admitting: *Deleted

## 2019-09-25 DIAGNOSIS — I6789 Other cerebrovascular disease: Secondary | ICD-10-CM

## 2019-09-25 DIAGNOSIS — Z5181 Encounter for therapeutic drug level monitoring: Secondary | ICD-10-CM | POA: Diagnosis not present

## 2019-09-25 LAB — POCT INR: INR: 1.9 — AB (ref 2.0–3.0)

## 2019-09-25 NOTE — Patient Instructions (Signed)
Description   Today take 1.5 tablets of Coumadin, then resume same dosage 1.5 tablets daily except for 1 tablet on Mondays, Wednesdays, and Fridays. Recheck INR in 3 weeks. [facility]# [phone] Main # [phone] with any problems new medications or if scheduled for any procedures.

## 2019-10-06 ENCOUNTER — Ambulatory Visit: Payer: Medicare Other | Attending: Internal Medicine

## 2019-10-06 DIAGNOSIS — Z23 Encounter for immunization: Secondary | ICD-10-CM

## 2019-10-06 NOTE — Progress Notes (Signed)
   Covid-19 Vaccination Clinic  Name:  Renee Adams    MRN: [id number] DOB: 05/09/1934  10/06/2019  Renee Adams was observed post Covid-19 immunization for 15 minutes without incident. She was provided with Vaccine Information Sheet and instruction to access the V-Safe system.   Renee Adams was instructed to call 911 with any severe reactions post vaccine: Marland Kitchen Difficulty breathing  . Swelling of face and throat  . A fast heartbeat  . A bad rash all over body  . Dizziness and weakness   Immunizations Administered    Name Date Dose VIS Date Route   Pfizer COVID-19 Vaccine 10/06/2019 12:57 PM 0.3 mL 06/19/2019 Intramuscular   Manufacturer: [place]   Lot: [id number]   [place]: [id number]

## 2019-10-14 ENCOUNTER — Other Ambulatory Visit: Payer: Self-pay | Admitting: Interventional Cardiology

## 2019-10-22 ENCOUNTER — Other Ambulatory Visit: Payer: Self-pay

## 2019-10-22 ENCOUNTER — Ambulatory Visit (INDEPENDENT_AMBULATORY_CARE_PROVIDER_SITE_OTHER): Payer: Medicare Other | Admitting: *Deleted

## 2019-10-22 DIAGNOSIS — Z5181 Encounter for therapeutic drug level monitoring: Secondary | ICD-10-CM

## 2019-10-22 DIAGNOSIS — I6789 Other cerebrovascular disease: Secondary | ICD-10-CM | POA: Diagnosis not present

## 2019-10-22 LAB — POCT INR: INR: 2.5 (ref 2.0–3.0)

## 2019-10-22 NOTE — Patient Instructions (Addendum)
Description   Continue taking 1.5 tablets daily except for 1 tablet on Mondays, Wednesdays, and Fridays. Recheck INR in 5 weeks. [facility]# [phone] Main # [phone] with any problems new medications or if scheduled for any procedures.

## 2019-11-25 ENCOUNTER — Other Ambulatory Visit: Payer: Self-pay

## 2019-11-25 ENCOUNTER — Ambulatory Visit (INDEPENDENT_AMBULATORY_CARE_PROVIDER_SITE_OTHER): Payer: Medicare Other | Admitting: *Deleted

## 2019-11-25 DIAGNOSIS — I6789 Other cerebrovascular disease: Secondary | ICD-10-CM

## 2019-11-25 DIAGNOSIS — Z5181 Encounter for therapeutic drug level monitoring: Secondary | ICD-10-CM | POA: Diagnosis not present

## 2019-11-25 LAB — POCT INR: INR: 1.2 — AB (ref 2.0–3.0)

## 2019-11-25 NOTE — Patient Instructions (Signed)
Description   Today take 1.5 tablets and take 2 tablets tomorrow then continue taking 1.5 tablets daily except for 1 tablet on Mondays, Wednesdays, and Fridays. Recheck INR in 1 week. [facility] Clinic# [phone] Main # [phone] with any problems new medications or if scheduled for any procedures.

## 2019-12-04 ENCOUNTER — Other Ambulatory Visit: Payer: Self-pay

## 2019-12-04 ENCOUNTER — Ambulatory Visit (INDEPENDENT_AMBULATORY_CARE_PROVIDER_SITE_OTHER): Payer: Medicare Other

## 2019-12-04 DIAGNOSIS — Z5181 Encounter for therapeutic drug level monitoring: Secondary | ICD-10-CM | POA: Diagnosis not present

## 2019-12-04 DIAGNOSIS — I6789 Other cerebrovascular disease: Secondary | ICD-10-CM | POA: Diagnosis not present

## 2019-12-04 LAB — POCT INR: INR: 2.4 (ref 2.0–3.0)

## 2019-12-04 NOTE — Patient Instructions (Signed)
Description   Continue taking 1.5 tablets daily except for 1 tablet on Mondays, Wednesdays, and Fridays. Recheck INR in 3 weeks. [facility]# [phone] Main # [phone] with any problems new medications or if scheduled for any procedures.

## 2019-12-25 ENCOUNTER — Other Ambulatory Visit: Payer: Self-pay

## 2019-12-25 ENCOUNTER — Ambulatory Visit (INDEPENDENT_AMBULATORY_CARE_PROVIDER_SITE_OTHER): Payer: Medicare Other

## 2019-12-25 DIAGNOSIS — I6789 Other cerebrovascular disease: Secondary | ICD-10-CM | POA: Diagnosis not present

## 2019-12-25 DIAGNOSIS — Z5181 Encounter for therapeutic drug level monitoring: Secondary | ICD-10-CM | POA: Diagnosis not present

## 2019-12-25 LAB — POCT INR: INR: 1.7 — AB (ref 2.0–3.0)

## 2019-12-25 NOTE — Patient Instructions (Signed)
Description   Take 2 tablets today, then resume same dosage 1.5 tablets daily except for 1 tablet on Mondays, Wednesdays, and Fridays. Recheck INR in 3 weeks. [facility] Clinic# [phone] Main # [phone] with any problems new medications or if scheduled for any procedures.

## 2020-01-01 ENCOUNTER — Ambulatory Visit: Payer: TRICARE For Life (TFL) | Admitting: Interventional Cardiology

## 2020-01-04 ENCOUNTER — Other Ambulatory Visit: Payer: Medicare Other

## 2020-01-04 ENCOUNTER — Ambulatory Visit: Payer: Medicare Other | Admitting: Oncology

## 2020-01-15 ENCOUNTER — Other Ambulatory Visit: Payer: Self-pay

## 2020-01-15 ENCOUNTER — Ambulatory Visit (INDEPENDENT_AMBULATORY_CARE_PROVIDER_SITE_OTHER): Payer: Medicare Other

## 2020-01-15 DIAGNOSIS — I6789 Other cerebrovascular disease: Secondary | ICD-10-CM | POA: Diagnosis not present

## 2020-01-15 DIAGNOSIS — Z5181 Encounter for therapeutic drug level monitoring: Secondary | ICD-10-CM | POA: Diagnosis not present

## 2020-01-15 LAB — POCT INR: INR: 3.8 — AB (ref 2.0–3.0)

## 2020-01-15 NOTE — Patient Instructions (Signed)
Hold today and then resume same dosage 1.5 tablets daily except for 1 tablet on Mondays, Wednesdays, and Fridays. Recheck INR in 2 weeks. [facility]in Clinic# [phone] Main # [phone] with any problems new medications or if scheduled for any procedures.

## 2020-01-18 NOTE — Progress Notes (Signed)
[facility]  [phone] [phone]     ID: HARJOT ZAVADIL DOB: [birth year]  MR#: [id number]  CSN#: [id number]  Patient Care Team: Alroy Dust, Carlean Jews.[name], MD as PCP - General (Family Medicine) Jettie Booze, MD as PCP - Cardiology (Cardiology) Bo Merino, MD as Consulting Physician (Rheumatology) Carroll Lingelbach, Virgie Dad, MD as Consulting Physician (Oncology) OTHER MD:  CHIEF COMPLAINT: Remote Hx of Left Breast Cancer  CURRENT TREATMENT: observation   INTERIM HISTORY: Renee Adams returns today for follow-up and treatment of her remote history of left breast cancer. She was last seen here on 01/01/2019.   She continues on observation alone.  Since her last visit here, she underwent routine screening mammography on 05/29/2019 revealing a possible mass in the right breast. Diagnostic mammography and unilateral right ultrasound on 06/11/2019 revealed indeterminate mass in the 2:30 o'clock position, 3 cm from the nipple measuring 0.9 x 0.5 x 0.8 cm. Biopsy ([id number]) of the right breast at 2:30 o'clock on 06/17/2019 revealed fibrocystic changes with calcifications.    She will be returning to annual screening mammography June 2022.   REVIEW OF SYSTEMS: [place] at [employer] vaccine and tolerated it well.  She tells me her daughter and her daughters children have been vaccinated but her son and his children have not.  She saw Dr. Lenna Gilford who found that she did not have rheumatoid arthritis but a severe case of Dupuytren's.  She did not suggest surgery, she thought it would make things worse.  Amybeth has significant problems with her hips and knees as well as her hands.  She takes tramadol for this and is mildly constipated at times.  She has a Radiation protection practitioner and is very careful regarding falls.  A detailed review of systems today was otherwise stable  BREAST CANCER HISTORY: Renee Adams was originally diagnosed with a left breast carcinoma in early 1996. Tumor was T1, N0,  ER and PR positive. She underwent left lumpectomy and sentinel lymph node dissection in March of 1996.  Patient was treated with tamoxifen for 5 years, then Evista for 3 years. The Evsita was then discontinued secondary to history of stroke.   Currently she is being followed with observation alone.   PAST MEDICAL HISTORY Past Medical History:  Diagnosis Date  . ASD (atrial septal defect)   . Cancer [facility])    breast, left  . Carpal tunnel syndrome, bilateral   . Depression   . Gait disorder   . GERD (gastroesophageal reflux disease)   . Glaucoma   . Glaucoma   . History of stroke   . Hypertension   . IBS (irritable bowel syndrome)   . Personal history of radiation therapy 1996  . S/P bunionectomy    bilateral    PAST SURGICAL HISTORY Past Surgical History:  Procedure Laterality Date  . ABDOMINAL HYSTERECTOMY    . bladder resuspension    . BREAST LUMPECTOMY Left 1996  . CATARACT EXTRACTION Bilateral   . INCISION AND DRAINAGE HIP  03/31/2012   Procedure: IRRIGATION AND DEBRIDEMENT HIP WITH POLY EXCHANGE;  Surgeon: Rudean Haskell, MD;  Location: [facility];  Service: Orthopedics;  Laterality: Left;  . ORIF FEMUR FRACTURE Right 11/30/2012   Procedure: OPEN REDUCTION INTERNAL FIXATION (ORIF) DISTAL FEMUR FRACTURE;  Surgeon: Vickey Huger, MD;  Location: [place];  Service: Orthopedics;  Laterality: Right;  . TOTAL HIP ARTHROPLASTY Bilateral   . TOTAL KNEE ARTHROPLASTY Bilateral     FAMILY HISTORY Family History  Problem Relation Age of Onset  .  Cancer Mother   . Breast cancer Mother   . Heart failure Father   . Diabetes Sister   . Osteoarthritis Brother   . Breast cancer Maternal Aunt   . 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 She lives alone in her own home with  no pets. The patient's daughter [name] is a Materials engineer and lives in [place]. The patient's son [name] is an Designer, multimedia in [place]. The  patient has 4 grandchildren.    ADVANCED DIRECTIVES: She has named both her children as healthcare powers of attorney   HEALTHCARE MAINTENANCE  Current Outpatient Medications  Medication Sig Dispense Refill  . ALPRAZolam (XANAX) 0.25 MG tablet Take 4 tablets (1 mg total) by mouth every 4 (four) hours as needed for anxiety. 40 tablet 0  . amLODipine (NORVASC) 2.5 MG tablet Take 2.5 mg by mouth daily.    Marland Kitchen atorvastatin (LIPITOR) 10 MG tablet Take 1 tablet (10 mg total) by mouth daily. Pt needs to schedule appointment for further refills - 1st attempt. Thank you 30 tablet 0  . buPROPion (WELLBUTRIN XL) 300 MG 24 hr tablet Take 1 tablet (300 mg total) by mouth daily. 30 tablet 1  . calcium carbonate 200 MG capsule Take 3 capsules (600 mg total) by mouth 2 (two) times daily with a meal. 60 capsule 1  . Cholecalciferol (VITAMIN D-1000 MAX ST) 1000 units tablet Take by mouth.    . diclofenac sodium (VOLTAREN) 1 % GEL Apply 2 g topically 4 (four) times daily.    Marland Kitchen EPINEPHrine 0.3 mg/0.3 mL IJ SOAJ injection Inject into the muscle.    . ergocalciferol (VITAMIN D2) 50000 UNITS capsule Take 50,000 Units by mouth once a week.    . furosemide (LASIX) 40 MG tablet Take 1 tablet (40 mg total) by mouth daily as needed. 90 tablet 3  . ketoconazole (NIZORAL) 2 % cream APPLY TO AFFECTED AREA ON THE SKIN DAILY 15 g 0  . latanoprost (XALATAN) 0.005 % ophthalmic solution Place 1 drop into both eyes at bedtime. 2.5 mL 12  . rOPINIRole (REQUIP) 1 MG tablet Take 1 tablet (1 mg total) by mouth at bedtime as needed. For restless legs 30 tablet 0  . sertraline (ZOLOFT) 100 MG tablet Take 1.5 tablets (150 mg total) by mouth daily. 30 tablet 1  . traMADol (ULTRAM) 50 MG tablet Take 50 mg by mouth 2 (two) times daily.    Marland Kitchen warfarin (COUMADIN) 2 MG tablet TAKE AS DIRECTED BY COUMADIN CLINIC 45 tablet 2   No current facility-administered medications for this visit.    Allergies:  Allergies  Allergen Reactions  .  Dilaudid [Hydromorphone Hcl] Shortness Of Breath    Tolerates tramadol  . Hydromorphone Other (See Comments)    UNKNOWN TO PATIENT  . Morphine Other (See Comments)    UNKNOWN TO PATIENT  . Morphine And Related Shortness Of Breath    Tolerates tramadol  . Sulfamethoxazole Other (See Comments)    UNKNOWN TO PATIENT  . Sulfa Antibiotics Other (See Comments)    "Made me drunk";  wobbly  . Keflex [Cephalexin] Nausea Only  . Levaquin [Levofloxacin] Hives and Rash    Physical Exam: white woman using a Rollator  Vitals:   01/19/20 1135  BP: (!) 151/85  Pulse: 83  Resp: 18  Temp: 98.3 F (36.8 C)  SpO2: 95%   Wt Readings from Last 3 Encounters:  01/19/20  146 lb 6.4 oz (66.4 kg)  01/07/19 146 lb (66.2 kg)  01/01/19 146 lb (66.2 kg)   Body mass index is 25.53 kg/m.    ECOG FS:2 - Symptomatic, <50% confined to bed  Ocular: Sclerae unicteric Ear-nose-throat: Wearing a mask Lymphatic: No cervical or supraclavicular adenopathy Lungs no rales or rhonchi Heart regular rate and rhythm Abd soft, nontender, positive bowel sounds MSK no focal spinal tenderness, no joint edema Neuro: non-focal, well-oriented, appropriate affect Breasts: The right breast is unremarkable.  The left breast is approximately 15% shorter.  It is otherwise unremarkable.  There is no evidence of local recurrence.  Both axillae are benign.   01/01/2019: Patient's hands     Lab Results: Lab Results  Component Value Date   WBC 6.0 01/19/2020   HGB 13.9 01/19/2020   HCT 42.0 01/19/2020   MCV 87.9 01/19/2020   PLT 227 01/19/2020   NEUTROABS 3.9 01/19/2020     Chemistry      Component Value Date/Time   NA 143 01/19/2020 1120   NA 142 10/11/2016 1242   K 3.8 01/19/2020 1120   K 4.4 10/11/2016 1242   CL 110 01/19/2020 1120   CL 109 (H) 09/10/2012 1315   CO2 24 01/19/2020 1120   CO2 24 10/11/2016 1242   BUN 13 01/19/2020 1120   BUN 15.6 10/11/2016 1242   CREATININE 0.85 01/19/2020 1120    CREATININE 1.11 (H) 01/01/2019 1500   CREATININE 0.9 10/11/2016 1242      Component Value Date/Time   CALCIUM 9.6 01/19/2020 1120   CALCIUM 9.8 10/11/2016 1242   ALKPHOS 85 01/19/2020 1120   ALKPHOS 103 10/11/2016 1242   AST 23 01/19/2020 1120   AST 20 01/01/2019 1500   AST 20 10/11/2016 1242   ALT 14 01/19/2020 1120   ALT 12 01/01/2019 1500   ALT 10 10/11/2016 1242   BILITOT 0.7 01/19/2020 1120   BILITOT 0.7 01/01/2019 1500   BILITOT 0.78 10/11/2016 1242      STUDIES: No results found.    Assessment:  86 y.o. [place] woman   (1)  status post left lumpectomy and sentinel lymph node dissection March 1996 for a T1, N0, ER/PR positive breast carcinoma.    (2)  Treated with tamoxifen for five years, then Evista for three years with the Evista discontinued secondary to stroke.    (3)  Currently being followed with observation alone.   Plan:  Chesnee is now 25 years out from definitive surgery for her breast cancer with no evidence of disease recurrence.  This is very favorable.  I have suggested she could simply be followed by Dr. Alroy Dust her primary care physician.  She tells me she really looks forward to coming here once a year, it makes her feel reassured, and that is a simple thing we can do for her so she will see me again a year from now.  She is already set for mammography and December  She knows to call for any other issue that may develop before that visit  Marquie Aderhold, Virgie Dad, MD  01/19/20 12:25 PM Medical Oncology and Hematology [facility] [place], [place] [address] Tel. [phone]    Fax. [phone]   I, Jacqualyn Posey am acting as a Education administrator for Chauncey Cruel, MD.   I, Lurline Del MD, have reviewed the above documentation for accuracy and completeness, and I agree with the above.    *Total Encounter Time as defined by the Centers  for Medicare and Medicaid Services includes, in addition to the face-to-face time  of a patient visit (documented in the note above) non-face-to-face time: obtaining and reviewing outside history, ordering and reviewing medications, tests or procedures, care coordination (communications with other health care professionals or caregivers) and documentation in the medical record.

## 2020-01-19 ENCOUNTER — Inpatient Hospital Stay (HOSPITAL_BASED_OUTPATIENT_CLINIC_OR_DEPARTMENT_OTHER): Payer: Medicare Other | Admitting: Oncology

## 2020-01-19 ENCOUNTER — Other Ambulatory Visit: Payer: Self-pay

## 2020-01-19 ENCOUNTER — Telehealth: Payer: Self-pay | Admitting: Oncology

## 2020-01-19 ENCOUNTER — Inpatient Hospital Stay: Payer: Medicare Other | Attending: Oncology

## 2020-01-19 VITALS — BP 151/85 | HR 83 | Temp 98.3°F | Resp 18 | Ht 63.5 in | Wt 146.4 lb

## 2020-01-19 DIAGNOSIS — M869 Osteomyelitis, unspecified: Secondary | ICD-10-CM

## 2020-01-19 DIAGNOSIS — R29898 Other symptoms and signs involving the musculoskeletal system: Secondary | ICD-10-CM

## 2020-01-19 DIAGNOSIS — C50812 Malignant neoplasm of overlapping sites of left female breast: Secondary | ICD-10-CM

## 2020-01-19 DIAGNOSIS — M720 Palmar fascial fibromatosis [Dupuytren]: Secondary | ICD-10-CM | POA: Insufficient documentation

## 2020-01-19 DIAGNOSIS — Z923 Personal history of irradiation: Secondary | ICD-10-CM | POA: Insufficient documentation

## 2020-01-19 DIAGNOSIS — Z170 Estrogen receptor positive status [ER+]: Secondary | ICD-10-CM | POA: Diagnosis not present

## 2020-01-19 DIAGNOSIS — Q211 Atrial septal defect: Secondary | ICD-10-CM | POA: Insufficient documentation

## 2020-01-19 DIAGNOSIS — Z803 Family history of malignant neoplasm of breast: Secondary | ICD-10-CM | POA: Insufficient documentation

## 2020-01-19 DIAGNOSIS — Z96642 Presence of left artificial hip joint: Secondary | ICD-10-CM

## 2020-01-19 DIAGNOSIS — Z79899 Other long term (current) drug therapy: Secondary | ICD-10-CM | POA: Diagnosis not present

## 2020-01-19 DIAGNOSIS — I10 Essential (primary) hypertension: Secondary | ICD-10-CM | POA: Diagnosis not present

## 2020-01-19 DIAGNOSIS — N6011 Diffuse cystic mastopathy of right breast: Secondary | ICD-10-CM | POA: Diagnosis not present

## 2020-01-19 DIAGNOSIS — Z8261 Family history of arthritis: Secondary | ICD-10-CM | POA: Diagnosis not present

## 2020-01-19 DIAGNOSIS — Z809 Family history of malignant neoplasm, unspecified: Secondary | ICD-10-CM | POA: Insufficient documentation

## 2020-01-19 DIAGNOSIS — Z8249 Family history of ischemic heart disease and other diseases of the circulatory system: Secondary | ICD-10-CM | POA: Insufficient documentation

## 2020-01-19 DIAGNOSIS — Z833 Family history of diabetes mellitus: Secondary | ICD-10-CM | POA: Diagnosis not present

## 2020-01-19 DIAGNOSIS — Z853 Personal history of malignant neoplasm of breast: Secondary | ICD-10-CM | POA: Diagnosis not present

## 2020-01-19 DIAGNOSIS — Z8673 Personal history of transient ischemic attack (TIA), and cerebral infarction without residual deficits: Secondary | ICD-10-CM | POA: Diagnosis not present

## 2020-01-19 LAB — COMPREHENSIVE METABOLIC PANEL
ALT: 14 U/L (ref 0–44)
AST: 23 U/L (ref 15–41)
Albumin: 3.9 g/dL (ref 3.5–5.0)
Alkaline Phosphatase: 85 U/L (ref 38–126)
Anion gap: 9 (ref 5–15)
BUN: 13 mg/dL (ref 8–23)
CO2: 24 mmol/L (ref 22–32)
Calcium: 9.6 mg/dL (ref 8.9–10.3)
Chloride: 110 mmol/L (ref 98–111)
Creatinine, Ser: 0.85 mg/dL (ref 0.44–1.00)
GFR calc Af Amer: >60 mL/min (ref >60)
GFR calc non Af Amer: >60 mL/min (ref >60)
Glucose, Bld: 88 mg/dL (ref 70–99)
Potassium: 3.8 mmol/L (ref 3.5–5.1)
Sodium: 143 mmol/L (ref 135–145)
Total Bilirubin: 0.7 mg/dL (ref 0.3–1.2)
Total Protein: 7 g/dL (ref 6.5–8.1)

## 2020-01-19 LAB — CBC WITH DIFFERENTIAL/PLATELET
Abs Immature Granulocytes: 0.01 10*3/uL (ref 0.00–0.07)
Basophils Absolute: 0.1 10*3/uL (ref 0.0–0.1)
Basophils Relative: 2 %
Eosinophils Absolute: 0.1 10*3/uL (ref 0.0–0.5)
Eosinophils Relative: 2 %
HCT: 42 % (ref 36.0–46.0)
Hemoglobin: 13.9 g/dL (ref 12.0–15.0)
Immature Granulocytes: 0 %
Lymphocytes Relative: 25 %
Lymphs Abs: 1.5 10*3/uL (ref 0.7–4.0)
MCH: 29.1 pg (ref 26.0–34.0)
MCHC: 33.1 g/dL (ref 30.0–36.0)
MCV: 87.9 fL (ref 80.0–100.0)
Monocytes Absolute: 0.4 10*3/uL (ref 0.1–1.0)
Monocytes Relative: 7 %
Neutro Abs: 3.9 10*3/uL (ref 1.7–7.7)
Neutrophils Relative %: 64 %
Platelets: 227 10*3/uL (ref 150–400)
RBC: 4.78 MIL/uL (ref 3.87–5.11)
RDW: 14.2 % (ref 11.5–15.5)
WBC: 6 10*3/uL (ref 4.0–10.5)
nRBC: 0 % (ref 0.0–0.2)

## 2020-01-19 NOTE — Telephone Encounter (Signed)
Scheduled appts per 7/13 los. Gave pt a print out of AVS.  °

## 2020-01-28 NOTE — Progress Notes (Deleted)
Cardiology Office Note   Date:  01/28/2020   ID:  Renee Adams, DOB [birth year], MRN [id number]  PCP:  Aurea Graff.[name], MD    No chief complaint on file.    Wt Readings from Last 3 Encounters:  01/19/20 146 lb 6.4 oz (66.4 kg)  01/07/19 146 lb (66.2 kg)  01/01/19 146 lb (66.2 kg)       History of Present Illness: Renee Adams is a 86 y.o. female  with prior CVA- possible PFO from prior echo: Long term Coumadin for stroke prevention, started many years ago by Dr. Jaci Standard.  Walking limited by arthritis.    Past Medical History:  Diagnosis Date  . ASD (atrial septal defect)   . Cancer [facility])    breast, left  . Carpal tunnel syndrome, bilateral   . Depression   . Gait disorder   . GERD (gastroesophageal reflux disease)   . Glaucoma   . Glaucoma   . History of stroke   . Hypertension   . IBS (irritable bowel syndrome)   . Personal history of radiation therapy 1996  . S/P bunionectomy    bilateral    Past Surgical History:  Procedure Laterality Date  . ABDOMINAL HYSTERECTOMY    . bladder resuspension    . BREAST LUMPECTOMY Left 1996  . CATARACT EXTRACTION Bilateral   . INCISION AND DRAINAGE HIP  03/31/2012   Procedure: IRRIGATION AND DEBRIDEMENT HIP WITH POLY EXCHANGE;  Surgeon: Rudean Haskell, MD;  Location: [place];  Service: Orthopedics;  Laterality: Left;  . ORIF FEMUR FRACTURE Right 11/30/2012   Procedure: OPEN REDUCTION INTERNAL FIXATION (ORIF) DISTAL FEMUR FRACTURE;  Surgeon: Vickey Huger, MD;  Location: [place];  Service: Orthopedics;  Laterality: Right;  . TOTAL HIP ARTHROPLASTY Bilateral   . TOTAL KNEE ARTHROPLASTY Bilateral      Current Outpatient Medications  Medication Sig Dispense Refill  . ALPRAZolam (XANAX) 0.25 MG tablet Take 4 tablets (1 mg total) by mouth every 4 (four) hours as needed for anxiety. 40 tablet 0  . amLODipine (NORVASC) 2.5 MG tablet Take 2.5 mg by mouth daily.    Marland Kitchen atorvastatin (LIPITOR) 10 MG tablet Take 1 tablet (10  mg total) by mouth daily. Pt needs to schedule appointment for further refills - 1st attempt. Thank you 30 tablet 0  . buPROPion (WELLBUTRIN XL) 300 MG 24 hr tablet Take 1 tablet (300 mg total) by mouth daily. 30 tablet 1  . calcium carbonate 200 MG capsule Take 3 capsules (600 mg total) by mouth 2 (two) times daily with a meal. 60 capsule 1  . Cholecalciferol (VITAMIN D-1000 MAX ST) 1000 units tablet Take by mouth.    . diclofenac sodium (VOLTAREN) 1 % GEL Apply 2 g topically 4 (four) times daily.    Marland Kitchen EPINEPHrine 0.3 mg/0.3 mL IJ SOAJ injection Inject into the muscle.    . ergocalciferol (VITAMIN D2) 50000 UNITS capsule Take 50,000 Units by mouth once a week.    . furosemide (LASIX) 40 MG tablet Take 1 tablet (40 mg total) by mouth daily as needed. 90 tablet 3  . ketoconazole (NIZORAL) 2 % cream APPLY TO AFFECTED AREA ON THE SKIN DAILY 15 g 0  . latanoprost (XALATAN) 0.005 % ophthalmic solution Place 1 drop into both eyes at bedtime. 2.5 mL 12  . rOPINIRole (REQUIP) 1 MG tablet Take 1 tablet (1 mg total) by mouth at bedtime as needed. For restless legs 30 tablet 0  . sertraline (ZOLOFT) 100  MG tablet Take 1.5 tablets (150 mg total) by mouth daily. 30 tablet 1  . traMADol (ULTRAM) 50 MG tablet Take 50 mg by mouth 2 (two) times daily.    Marland Kitchen warfarin (COUMADIN) 2 MG tablet TAKE AS DIRECTED BY COUMADIN CLINIC 45 tablet 2   No current facility-administered medications for this visit.    Allergies:   Dilaudid [hydromorphone hcl], Hydromorphone, Morphine, Morphine and related, Sulfamethoxazole, Sulfa antibiotics, Keflex [cephalexin], and Levaquin [levofloxacin]    Social History:  The patient  reports that she has never smoked. She has never used smokeless tobacco. She reports that she does not drink alcohol and does not use drugs.   Family History:  The patient's ***family history includes Breast cancer in her maternal aunt and mother; Cancer in her mother; Diabetes in her sister; Heart failure in  her father; Osteoarthritis in her brother.    ROS:  Please see the history of present illness.   Otherwise, review of systems are positive for ***.   All other systems are reviewed and negative.    PHYSICAL EXAM: VS:  There were no vitals taken for this visit. , BMI There is no height or weight on file to calculate BMI. GEN: Well nourished, well developed, in no acute distress HEENT: normal Neck: no JVD, carotid bruits, or masses Cardiac: ***RRR; no murmurs, rubs, or gallops,no edema  Respiratory:  clear to auscultation bilaterally, normal work of breathing GI: soft, nontender, nondistended, + BS MS: no deformity or atrophy Skin: warm and dry, no rash Neuro:  Strength and sensation are intact Psych: euthymic mood, full affect   EKG:   The ekg ordered today demonstrates ***   Recent Labs: 01/19/2020: ALT 14; BUN 13; Creatinine, Ser 0.85; Hemoglobin 13.9; Platelets 227; Potassium 3.8; Sodium 143   Lipid Panel    Component Value Date/Time   CHOL 202 (H) 01/11/2014 1022   TRIG 156.0 (H) 01/11/2014 1022   HDL 49.70 01/11/2014 1022   CHOLHDL 4 01/11/2014 1022   VLDL 31.2 01/11/2014 1022   LDLCALC 121 (H) 01/11/2014 1022     Other studies Reviewed: Additional studies/ records that were reviewed today with results demonstrating: ***.   ASSESSMENT AND PLAN:  1. Prior CVA: She has been maintained on Coumadin for many years, started by her prior cardiologist. 2. Hyperlipidemia: 3. Hypertension: 4. Lower extremity edema:   Current medicines are reviewed at length with the patient today.  The patient concerns regarding her medicines were addressed.  The following changes have been made:  No change***  Labs/ tests ordered today include: *** No orders of the defined types were placed in this encounter.   Recommend 150 minutes/week of aerobic exercise Low fat, low carb, high fiber diet recommended  Disposition:   FU in ***   Signed, Larae Grooms, MD  01/28/2020  8:03 PM    [facility] [place], [place], [place]  [address] Phone: [phone]; Fax: [phone]

## 2020-01-29 ENCOUNTER — Ambulatory Visit: Payer: TRICARE For Life (TFL) | Admitting: Interventional Cardiology

## 2020-01-31 NOTE — Progress Notes (Signed)
Cardiology Office Note   Date:  02/02/2020   ID:  Renee Adams, DOB 05/06/1934, MRN [id number]  PCP:  Aurea Graff.[name], MD    No chief complaint on file.  CVA/PFO  Wt Readings from Last 3 Encounters:  02/02/20 147 lb 9.6 oz (67 kg)  01/19/20 146 lb 6.4 oz (66.4 kg)  01/07/19 146 lb (66.2 kg)       History of Present Illness: Renee Adams is a 86 y.o. female  with prior CVA- possible PFO from prior echo: Long term Coumadin for stroke prevention, started many years ago by Dr. Jaci Standard.   2012 echo showed Normal LV function, atrial septal aneurysm.  Walking limited by arthritis.  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    Denies : Chest pain. Dizziness. Leg edema. Nitroglycerin use. Orthopnea. Paroxysmal nocturnal dyspnea. Shortness of breath. Syncope.   Rare palpitations. Nothing sustained.   COVID vaccines received.   Past Medical History:  Diagnosis Date   ASD (atrial septal defect)    Cancer (HCC)    breast, left   Carpal tunnel syndrome, bilateral    Depression    Gait disorder    GERD (gastroesophageal reflux disease)    Glaucoma    Glaucoma    History of stroke    Hypertension    IBS (irritable bowel syndrome)    Personal history of radiation therapy 1996   S/P bunionectomy    bilateral    Past Surgical History:  Procedure Laterality Date   ABDOMINAL HYSTERECTOMY     bladder resuspension     BREAST LUMPECTOMY Left 1996   CATARACT EXTRACTION Bilateral    INCISION AND DRAINAGE HIP  03/31/2012   Procedure: IRRIGATION AND DEBRIDEMENT HIP WITH POLY EXCHANGE;  Surgeon: Rudean Haskell, MD;  Location: [facility];  Service: Orthopedics;  Laterality: Left;   ORIF FEMUR FRACTURE Right 11/30/2012   Procedure: OPEN REDUCTION INTERNAL FIXATION (ORIF) DISTAL FEMUR FRACTURE;  Surgeon: Vickey Huger, MD;  Location: [facility];  Service: Orthopedics;  Laterality: Right;   TOTAL HIP ARTHROPLASTY  Bilateral    TOTAL KNEE ARTHROPLASTY Bilateral      Current Outpatient Medications  Medication Sig Dispense Refill   ALPRAZolam (XANAX) 0.25 MG tablet Take 4 tablets (1 mg total) by mouth every 4 (four) hours as needed for anxiety. 40 tablet 0   amLODipine (NORVASC) 2.5 MG tablet Take 2.5 mg by mouth daily.     atorvastatin (LIPITOR) 10 MG tablet Take 1 tablet (10 mg total) by mouth daily. Pt needs to schedule appointment for further refills - 1st attempt. Thank you 30 tablet 0   buPROPion (WELLBUTRIN XL) 300 MG 24 hr tablet Take 1 tablet (300 mg total) by mouth daily. 30 tablet 1   calcium carbonate 200 MG capsule Take 3 capsules (600 mg total) by mouth 2 (two) times daily with a meal. 60 capsule 1   Cholecalciferol (VITAMIN D-1000 MAX ST) 1000 units tablet Take by mouth.     EPINEPHrine 0.3 mg/0.3 mL IJ SOAJ injection Inject into the muscle.     ergocalciferol (VITAMIN D2) 50000 UNITS capsule Take 50,000 Units by mouth once a week.     furosemide (LASIX) 40 MG tablet Take 1 tablet (40 mg total) by mouth daily as needed. 90 tablet 3   ketoconazole (NIZORAL) 2 % cream APPLY TO AFFECTED AREA ON THE SKIN DAILY 15 g 0   latanoprost (XALATAN) 0.005 % ophthalmic solution  Place 1 drop into both eyes at bedtime. 2.5 mL 12   rOPINIRole (REQUIP) 1 MG tablet Take 1 tablet (1 mg total) by mouth at bedtime as needed. For restless legs 30 tablet 0   sertraline (ZOLOFT) 100 MG tablet Take 1.5 tablets (150 mg total) by mouth daily. 30 tablet 1   traMADol (ULTRAM) 50 MG tablet Take 50 mg by mouth 2 (two) times daily.     warfarin (COUMADIN) 2 MG tablet TAKE AS DIRECTED BY COUMADIN CLINIC 45 tablet 2   clobetasol (TEMOVATE) 0.05 % external solution Apply 1 application topically daily as needed.     No current facility-administered medications for this visit.    Allergies:   Dilaudid [hydromorphone hcl], Hydromorphone, Morphine, Morphine and related, Sulfamethoxazole, Sulfa antibiotics,  Keflex [cephalexin], and Levaquin [levofloxacin]    Social History:  The patient  reports that she has never smoked. She has never used smokeless tobacco. She reports that she does not drink alcohol and does not use drugs.   Family History:  The patient's family history includes Breast cancer in her maternal aunt and mother; Cancer in her mother; Diabetes in her sister; Heart failure in her father; Osteoarthritis in her brother.    ROS:  Please see the history of present illness.   Otherwise, review of systems are positive for joint pain.   All other systems are reviewed and negative.    PHYSICAL EXAM: VS:  BP 122/72    Pulse 77    Ht 5\' 3"  (1.6 m)    Wt 147 lb 9.6 oz (67 kg)    SpO2 97%    BMI 26.15 kg/m  , BMI Body mass index is 26.15 kg/m. GEN: Well nourished, well developed, in no acute distress  HEENT: normal  Neck: no JVD, carotid bruits, or masses Cardiac: RRR; no murmurs, rubs, or gallops,no edema  Respiratory:  clear to auscultation bilaterally, normal work of breathing GI: soft, nontender, nondistended, + BS MS: no deformity or atrophy  Skin: warm and dry, no rash Neuro:  Strength and sensation are intact Psych: euthymic mood, full affect   EKG:   The ekg ordered today demonstrates NSR, PRWP, LAD   Recent Labs: 01/19/2020: ALT 14; BUN 13; Creatinine, Ser 0.85; Hemoglobin 13.9; Platelets 227; Potassium 3.8; Sodium 143   Lipid Panel    Component Value Date/Time   CHOL 202 (H) 01/11/2014 1022   TRIG 156.0 (H) 01/11/2014 1022   HDL 49.70 01/11/2014 1022   CHOLHDL 4 01/11/2014 1022   VLDL 31.2 01/11/2014 1022   LDLCALC 121 (H) 01/11/2014 1022     Other studies Reviewed: Additional studies/ records that were reviewed today with results demonstrating: 2012 echo showed Normal LV function, atrial septal aneurysm.   ASSESSMENT AND PLAN:  1. Prior CVA: She has been maintained on Coumadin for many years, started by her prior cardiologist. 2. Hyperlipidemia: LFTs  normal.  LDL not checked recently but will continue atorvastatin given prior CVA.  3. Hypertension: The current medical regimen is effective;  continue present plan and medications. 4. Fatigue: check TSH at next blood draw. 5. Lower extremity edema: Elevate legs.  Venous insufficiency. 6. Anticoagulated: No bleeding issues.    Current medicines are reviewed at length with the patient today.  The patient concerns regarding her medicines were addressed.  The following changes have been made:  No change  Labs/ tests ordered today include:  No orders of the defined types were placed in this encounter.   Recommend 150 minutes/week  of aerobic exercise Low fat, low carb, high fiber diet recommended  Disposition:   FU in 1 year   Signed, Larae Grooms, MD  02/02/2020 4:41 PM    [facility] [address], [place], [place]  [address] Phone: [phone]; Fax: [phone]

## 2020-02-02 ENCOUNTER — Encounter: Payer: Self-pay | Admitting: Interventional Cardiology

## 2020-02-02 ENCOUNTER — Other Ambulatory Visit: Payer: Self-pay

## 2020-02-02 ENCOUNTER — Ambulatory Visit (INDEPENDENT_AMBULATORY_CARE_PROVIDER_SITE_OTHER): Payer: Medicare Other | Admitting: Interventional Cardiology

## 2020-02-02 ENCOUNTER — Ambulatory Visit (INDEPENDENT_AMBULATORY_CARE_PROVIDER_SITE_OTHER): Payer: Medicare Other | Admitting: *Deleted

## 2020-02-02 VITALS — BP 122/72 | HR 77 | Ht 63.0 in | Wt 147.6 lb

## 2020-02-02 DIAGNOSIS — I6789 Other cerebrovascular disease: Secondary | ICD-10-CM

## 2020-02-02 DIAGNOSIS — Q211 Atrial septal defect, unspecified: Secondary | ICD-10-CM

## 2020-02-02 DIAGNOSIS — Z7901 Long term (current) use of anticoagulants: Secondary | ICD-10-CM | POA: Diagnosis not present

## 2020-02-02 DIAGNOSIS — Z5181 Encounter for therapeutic drug level monitoring: Secondary | ICD-10-CM | POA: Diagnosis not present

## 2020-02-02 DIAGNOSIS — I639 Cerebral infarction, unspecified: Secondary | ICD-10-CM | POA: Diagnosis not present

## 2020-02-02 DIAGNOSIS — R5383 Other fatigue: Secondary | ICD-10-CM | POA: Diagnosis not present

## 2020-02-02 DIAGNOSIS — E782 Mixed hyperlipidemia: Secondary | ICD-10-CM

## 2020-02-02 DIAGNOSIS — I10 Essential (primary) hypertension: Secondary | ICD-10-CM

## 2020-02-02 LAB — POCT INR: INR: 2.8 (ref 2.0–3.0)

## 2020-02-02 MED ORDER — ATORVASTATIN CALCIUM 10 MG PO TABS: 10.0000 mg | ORAL_TABLET | Freq: Every day | ORAL | 3 refills | Status: DC

## 2020-02-02 NOTE — Patient Instructions (Signed)
Medication Instructions:  Your physician recommends that you continue on your current medications as directed. Please refer to the Current Medication list given to you today.  *If you need a refill on your cardiac medications before your next appointment, please call your pharmacy*   Lab Work: You will have a fasting lipid and TSH drawn on 03/02/2020, the same day that you are here for your next coumadin clinic appointment. You may come in any time that day between 7:30 and 4:30.  If you have labs (blood work) drawn today and your tests are completely normal, you will receive your results only by: Marland Kitchen MyChart Message (if you have MyChart) OR . A paper copy in the mail If you have any lab test that is abnormal or we need to change your treatment, we will call you to review the results.   Testing/Procedures: None   Follow-Up: At [facility], you and your health needs are our priority.  As part of our continuing mission to provide you with exceptional heart care, we have created designated Provider Care Teams.  These Care Teams include your primary Cardiologist (physician) and Advanced Practice Providers (APPs -  Physician Assistants and Nurse Practitioners) who all work together to provide you with the care you need, when you need it.    Your next appointment:   12 month(s)  The format for your next appointment:   In Person  Provider:   You may see Larae Grooms, MD or one of the following Advanced Practice Providers on your designated Care Team:    Melina Copa, PA-C  Ermalinda Barrios, PA-C

## 2020-02-02 NOTE — Patient Instructions (Signed)
Description   Continue taking Warfarin 1.5 tablets daily except for 1 tablet on Mondays, Wednesdays, and Fridays. Recheck INR in 4 weeks. Coumadin Clinic# [phone] Main # [phone] with any problems new medications or if scheduled for any procedures.       

## 2020-02-03 ENCOUNTER — Other Ambulatory Visit: Payer: Self-pay

## 2020-02-03 MED ORDER — AMLODIPINE BESYLATE 2.5 MG PO TABS: 2.5000 mg | ORAL_TABLET | Freq: Every day | ORAL | 3 refills | Status: DC

## 2020-02-03 NOTE — Telephone Encounter (Signed)
Pt's medication was sent to pt's pharmacy as requested. Confirmation received.  °

## 2020-02-22 ENCOUNTER — Other Ambulatory Visit: Payer: Self-pay | Admitting: Interventional Cardiology

## 2020-03-02 ENCOUNTER — Other Ambulatory Visit: Payer: Medicare Other

## 2020-03-02 ENCOUNTER — Telehealth: Payer: Self-pay | Admitting: *Deleted

## 2020-03-02 NOTE — Telephone Encounter (Addendum)
Pt had an Anticoagulation Appt this morning and did not show up so called her and left a message to call back to r/s. Will await.  Called cell phone number and left a message as well. Will await a call back.

## 2020-03-08 ENCOUNTER — Ambulatory Visit (INDEPENDENT_AMBULATORY_CARE_PROVIDER_SITE_OTHER): Payer: Medicare Other | Admitting: Pharmacist

## 2020-03-08 ENCOUNTER — Other Ambulatory Visit: Payer: Self-pay

## 2020-03-08 ENCOUNTER — Other Ambulatory Visit: Payer: Medicare Other | Admitting: *Deleted

## 2020-03-08 DIAGNOSIS — E782 Mixed hyperlipidemia: Secondary | ICD-10-CM | POA: Diagnosis not present

## 2020-03-08 DIAGNOSIS — R5383 Other fatigue: Secondary | ICD-10-CM

## 2020-03-08 DIAGNOSIS — I6789 Other cerebrovascular disease: Secondary | ICD-10-CM | POA: Diagnosis not present

## 2020-03-08 DIAGNOSIS — Z5181 Encounter for therapeutic drug level monitoring: Secondary | ICD-10-CM | POA: Diagnosis not present

## 2020-03-08 LAB — POCT INR: INR: 3.1 — AB (ref 2.0–3.0)

## 2020-03-08 NOTE — Patient Instructions (Signed)
Description   Take 1 tablet today and continue taking Warfarin 1.5 tablets daily except for 1 tablet on Mondays, Wednesdays, and Fridays. Recheck INR in 3 weeks. Coumadin Clinic# [phone] Main # [phone] with any problems new medications or if scheduled for any procedures.

## 2020-03-09 LAB — LIPID PANEL
Chol/HDL Ratio: 2.5 ratio (ref 0.0–4.4)
Cholesterol, Total: 147 mg/dL (ref 100–199)
HDL: 60 mg/dL (ref >39)
LDL Chol Calc (NIH): 69 mg/dL (ref 0–99)
Triglycerides: 98 mg/dL (ref 0–149)
VLDL Cholesterol Cal: 18 mg/dL (ref 5–40)

## 2020-03-09 LAB — TSH: TSH: 1.83 u[IU]/mL (ref 0.450–4.500)

## 2020-03-24 ENCOUNTER — Telehealth: Payer: Self-pay | Admitting: Interventional Cardiology

## 2020-03-24 NOTE — Telephone Encounter (Signed)
Renee Adams is calling requesting her lab results be sent to her in the mail due to not being able to access her MyChart.

## 2020-03-24 NOTE — Telephone Encounter (Signed)
Called and made patient aware that I will send a copy of her labs in the mail.

## 2020-03-29 ENCOUNTER — Ambulatory Visit (INDEPENDENT_AMBULATORY_CARE_PROVIDER_SITE_OTHER): Payer: Medicare Other | Admitting: *Deleted

## 2020-03-29 ENCOUNTER — Other Ambulatory Visit: Payer: Self-pay

## 2020-03-29 DIAGNOSIS — Z5181 Encounter for therapeutic drug level monitoring: Secondary | ICD-10-CM

## 2020-03-29 DIAGNOSIS — I6789 Other cerebrovascular disease: Secondary | ICD-10-CM | POA: Diagnosis not present

## 2020-03-29 LAB — POCT INR: INR: 2.9 (ref 2.0–3.0)

## 2020-03-29 NOTE — Patient Instructions (Signed)
Description   Continue taking Warfarin 1.5 tablets daily except for 1 tablet on Mondays, Wednesdays, and Fridays. Recheck INR in 4 weeks. Coumadin Clinic# [phone] Main # [phone] with any problems new medications or if scheduled for any procedures.

## 2020-04-13 DIAGNOSIS — Z96652 Presence of left artificial knee joint: Secondary | ICD-10-CM | POA: Diagnosis not present

## 2020-04-13 DIAGNOSIS — Z96651 Presence of right artificial knee joint: Secondary | ICD-10-CM | POA: Diagnosis not present

## 2020-04-13 DIAGNOSIS — Z96641 Presence of right artificial hip joint: Secondary | ICD-10-CM | POA: Diagnosis not present

## 2020-04-13 DIAGNOSIS — M25561 Pain in right knee: Secondary | ICD-10-CM | POA: Diagnosis not present

## 2020-04-26 ENCOUNTER — Ambulatory Visit (INDEPENDENT_AMBULATORY_CARE_PROVIDER_SITE_OTHER): Payer: Medicare Other | Admitting: *Deleted

## 2020-04-26 ENCOUNTER — Other Ambulatory Visit: Payer: Self-pay

## 2020-04-26 DIAGNOSIS — Z5181 Encounter for therapeutic drug level monitoring: Secondary | ICD-10-CM

## 2020-04-26 DIAGNOSIS — Z23 Encounter for immunization: Secondary | ICD-10-CM | POA: Diagnosis not present

## 2020-04-26 DIAGNOSIS — I6789 Other cerebrovascular disease: Secondary | ICD-10-CM

## 2020-04-26 LAB — POCT INR: INR: 3.6 — AB (ref 2.0–3.0)

## 2020-04-26 NOTE — Patient Instructions (Signed)
Description   Do not take any Warfarin today then continue taking Warfarin 1.5 tablets daily except for 1 tablet on Mondays, Wednesdays, and Fridays. Recheck INR in 3 weeks. Coumadin Clinic# [phone] Main # [phone] with any problems new medications or if scheduled for any procedures.

## 2020-05-18 ENCOUNTER — Other Ambulatory Visit: Payer: Self-pay

## 2020-05-18 ENCOUNTER — Ambulatory Visit (INDEPENDENT_AMBULATORY_CARE_PROVIDER_SITE_OTHER): Payer: Medicare Other | Admitting: *Deleted

## 2020-05-18 DIAGNOSIS — I6789 Other cerebrovascular disease: Secondary | ICD-10-CM | POA: Diagnosis not present

## 2020-05-18 DIAGNOSIS — Z5181 Encounter for therapeutic drug level monitoring: Secondary | ICD-10-CM | POA: Diagnosis not present

## 2020-05-18 LAB — POCT INR: INR: 1.7 — AB (ref 2.0–3.0)

## 2020-05-18 NOTE — Patient Instructions (Signed)
Description   Today take 1.5 tablets of Warfarin then continue taking Warfarin 1.5 tablets daily except for 1 tablet on Mondays, Wednesdays, and Fridays. Recheck INR in 3 weeks. Coumadin Clinic# [phone] Main # [phone] with any problems new medications or if scheduled for any procedures.

## 2020-06-08 ENCOUNTER — Telehealth: Payer: Self-pay | Admitting: *Deleted

## 2020-06-08 NOTE — Telephone Encounter (Signed)
Pt missed Anticoagulation Appt today; called pt but had to leave a message for her to call back to reschedule. Will await.

## 2020-06-13 ENCOUNTER — Other Ambulatory Visit: Payer: Self-pay | Admitting: Family Medicine

## 2020-06-13 DIAGNOSIS — Z1231 Encounter for screening mammogram for malignant neoplasm of breast: Secondary | ICD-10-CM

## 2020-06-21 ENCOUNTER — Ambulatory Visit (INDEPENDENT_AMBULATORY_CARE_PROVIDER_SITE_OTHER): Payer: Medicare Other | Admitting: *Deleted

## 2020-06-21 ENCOUNTER — Other Ambulatory Visit: Payer: Self-pay

## 2020-06-21 DIAGNOSIS — Z5181 Encounter for therapeutic drug level monitoring: Secondary | ICD-10-CM

## 2020-06-21 DIAGNOSIS — I6789 Other cerebrovascular disease: Secondary | ICD-10-CM

## 2020-06-21 LAB — POCT INR: INR: 1.6 — AB (ref 2.0–3.0)

## 2020-06-21 NOTE — Patient Instructions (Signed)
Description   Today take 2 tablets of Warfarin then start taking Warfarin 1.5 tablets daily except for 1 tablet on Wednesdays and Fridays. Recheck INR in 3 weeks. Coumadin Clinic# [phone] Main # [phone] with any problems new medications or if scheduled for any procedures.

## 2020-07-01 ENCOUNTER — Other Ambulatory Visit: Payer: Self-pay | Admitting: Interventional Cardiology

## 2020-07-13 ENCOUNTER — Ambulatory Visit (INDEPENDENT_AMBULATORY_CARE_PROVIDER_SITE_OTHER): Payer: Medicare Other | Admitting: *Deleted

## 2020-07-13 ENCOUNTER — Other Ambulatory Visit: Payer: Self-pay

## 2020-07-13 DIAGNOSIS — Z5181 Encounter for therapeutic drug level monitoring: Secondary | ICD-10-CM

## 2020-07-13 DIAGNOSIS — I6789 Other cerebrovascular disease: Secondary | ICD-10-CM | POA: Diagnosis not present

## 2020-07-13 LAB — POCT INR: INR: 2.6 (ref 2.0–3.0)

## 2020-07-13 NOTE — Patient Instructions (Addendum)
Description   Continue taking Warfarin 1.5 tablets daily except for 1 tablet on Wednesdays and Fridays. Keep your 2 salads in your diet weekly. Recheck INR in 4 weeks. Coumadin Clinic# [phone] Main # [phone] with any problems new medications or if scheduled for any procedures.

## 2020-07-27 ENCOUNTER — Ambulatory Visit: Payer: Medicare Other

## 2020-08-10 ENCOUNTER — Telehealth: Payer: Self-pay | Admitting: *Deleted

## 2020-08-10 NOTE — Telephone Encounter (Signed)
Called pt since she missed her appt today; had to leave a message for her to call back to reschedule.

## 2020-08-15 ENCOUNTER — Other Ambulatory Visit: Payer: Self-pay

## 2020-08-15 ENCOUNTER — Ambulatory Visit (INDEPENDENT_AMBULATORY_CARE_PROVIDER_SITE_OTHER): Payer: Medicare Other | Admitting: *Deleted

## 2020-08-15 DIAGNOSIS — Z5181 Encounter for therapeutic drug level monitoring: Secondary | ICD-10-CM

## 2020-08-15 DIAGNOSIS — I6789 Other cerebrovascular disease: Secondary | ICD-10-CM | POA: Diagnosis not present

## 2020-08-15 LAB — POCT INR: INR: 3.8 — AB (ref 2.0–3.0)

## 2020-08-15 NOTE — Patient Instructions (Signed)
Description   Do not take any Warfarin today then continue taking Warfarin 1.5 tablets daily except for 1 tablet on Wednesdays and Fridays. Keep your 2 salads in your diet weekly. Recheck INR in 3 weeks. Coumadin Clinic# [phone] Main # [phone] with any problems new medications or if scheduled for any procedures.

## 2020-09-01 ENCOUNTER — Telehealth: Payer: Self-pay | Admitting: *Deleted

## 2020-09-01 NOTE — Telephone Encounter (Signed)
I am ok without a bridge for this patient.  She has been stable for many years and is somewhat frail.

## 2020-09-01 NOTE — Telephone Encounter (Signed)
Patient with diagnosis of CVA started on warfarin many years ago by her former cardiologist.    Procedure: 8 dental extractions Date of procedure: TBD  CrCl 45mL/min Platelet count 227K  Patient does not require pre-op antibiotics for dental procedure.  Will likely need up to 5 day warfarin hold for 8 dental extractions. Will forward to MD for input regarding potential need for Lovenox bridge as patient's only indication for anticoagulation is old CVA.

## 2020-09-01 NOTE — Telephone Encounter (Signed)
Ok to hold warfarin for up to 5 days prior to dental extractions.

## 2020-09-01 NOTE — Telephone Encounter (Signed)
   [facility] Pre-operative Risk Assessment    HEARTCARE STAFF: - Please ensure there is not already an duplicate clearance open for this procedure. - Under Visit Info/Reason for Call, type in Other and utilize the format Clearance MM/DD/YY or Clearance TBD. Do not use dashes or single digits. - If request is for dental extraction, please clarify the # of teeth to be extracted.  Request for surgical clearance:  1. What type of surgery is being performed? 8 TEETH TO BE EXTRACTED   2. When is this surgery scheduled? TBD   3. What type of clearance is required (medical clearance vs. Pharmacy clearance to hold med vs. Both)? BOTH  4. Are there any medications that need to be held prior to surgery and how long? WARFARIN   5. Practice name and name of physician performing surgery? DR. Moss Mc II, DDS   6. What is the office phone number? [phone]   7.   What is the office fax number? [phone]  8.   Anesthesia type (None, local, MAC, general) ? LOCAL   Julaine Hua 09/01/2020, 1:49 PM  _________________________________________________________________   (provider comments below)

## 2020-09-01 NOTE — Telephone Encounter (Signed)
Patient is on Coumadin for stroke prevention.  Clinical pharmacist to review Coumadin holding time prior to dental extraction.

## 2020-09-02 NOTE — Telephone Encounter (Signed)
Left message for the patient to call back and speak to the on-call preop APP of the day 

## 2020-09-07 ENCOUNTER — Other Ambulatory Visit: Payer: Self-pay

## 2020-09-07 ENCOUNTER — Ambulatory Visit (INDEPENDENT_AMBULATORY_CARE_PROVIDER_SITE_OTHER): Payer: Medicare Other | Admitting: *Deleted

## 2020-09-07 DIAGNOSIS — I6789 Other cerebrovascular disease: Secondary | ICD-10-CM

## 2020-09-07 DIAGNOSIS — Z5181 Encounter for therapeutic drug level monitoring: Secondary | ICD-10-CM | POA: Diagnosis not present

## 2020-09-07 LAB — POCT INR: INR: 2.9 (ref 2.0–3.0)

## 2020-09-07 NOTE — Patient Instructions (Signed)
Description   Continue taking Warfarin 1.5 tablets daily except for 1 tablet on Wednesdays and Fridays. Keep your 2 salads in your diet weekly. Call once you get your extraction procedure date so we can adjust return date. Recheck INR in 4 weeks. Coumadin Clinic# [phone] Main # [phone] with any problems new medications or if scheduled for any procedures.

## 2020-09-09 NOTE — Telephone Encounter (Signed)
Left VM  home phone requesting call back 09/09/20 at 11:40AM.  Called cell phone with no answer and no VM set up.  Loel Dubonnet, NP

## 2020-09-14 DIAGNOSIS — M150 Primary generalized (osteo)arthritis: Secondary | ICD-10-CM | POA: Diagnosis not present

## 2020-09-14 DIAGNOSIS — Z6825 Body mass index (BMI) 25.0-25.9, adult: Secondary | ICD-10-CM | POA: Diagnosis not present

## 2020-09-14 DIAGNOSIS — M2550 Pain in unspecified joint: Secondary | ICD-10-CM | POA: Diagnosis not present

## 2020-09-14 DIAGNOSIS — G629 Polyneuropathy, unspecified: Secondary | ICD-10-CM | POA: Diagnosis not present

## 2020-09-14 DIAGNOSIS — M720 Palmar fascial fibromatosis [Dupuytren]: Secondary | ICD-10-CM | POA: Diagnosis not present

## 2020-09-14 DIAGNOSIS — E663 Overweight: Secondary | ICD-10-CM | POA: Diagnosis not present

## 2020-09-15 ENCOUNTER — Other Ambulatory Visit: Payer: Self-pay

## 2020-09-15 ENCOUNTER — Ambulatory Visit
Admission: RE | Admit: 2020-09-15 | Discharge: 2020-09-15 | Disposition: A | Discharge status: Home / Self Care | Payer: Medicare Other | Source: Ambulatory Visit | Attending: Family Medicine | Admitting: Family Medicine

## 2020-09-15 DIAGNOSIS — Z1231 Encounter for screening mammogram for malignant neoplasm of breast: Secondary | ICD-10-CM

## 2020-09-21 NOTE — Telephone Encounter (Signed)
   Primary Cardiologist: Larae Grooms, MD  Chart reviewed as part of pre-operative protocol coverage. Left a voicemail on home phone for patient to call back for ongoing preop assessment. Attempted to call cell phone, though voicemail box was full.   Abigail Butts, PA-C 09/21/2020, 8:55 AM

## 2020-09-22 NOTE — Telephone Encounter (Addendum)
Pt called back the Anticoagulation Clinic and stated that she now has her dental procedure date for 10/05/2020. Made her aware that the Pre-Op team has been trying to reach her. Will forward to Pre-Op to ensure they are able to call and speak with pt.   Also, rescheduled next Warfarin Appt to 09/28/20 at 2pm to ensure upcoming warfarin holding instructions.

## 2020-09-22 NOTE — Telephone Encounter (Signed)
   Primary Cardiologist: Larae Grooms, MD  Chart reviewed as part of pre-operative protocol coverage.   Left a voicemail for patient to call back for ongoing preop assessment.   Schedulers, please attempt to connect her directly with preop provider in the event she calls back given difficulty reaching patient.   Abigail Butts, PA-C 09/22/2020, 3:33 PM

## 2020-09-23 NOTE — Telephone Encounter (Signed)
   Primary Cardiologist: Larae Grooms, MD  Chart reviewed as part of pre-operative protocol coverage. Patient was contacted 09/23/2020 in reference to pre-operative risk assessment for pending surgery as outlined below.  Renee Adams was last seen on 02/02/20 by Dr. Irish Lack.  Since that day, Renee Adams has done fine from a cardiac standpoint. She has limited mobility due to pains in her legs but is able to complete 4 METs without anginal complaints.  Therefore, based on ACC/AHA guidelines, the patient would be at acceptable risk for the planned procedure without further cardiovascular testing.   The patient was advised that if she develops new symptoms prior to surgery to contact our office to arrange for a follow-up visit, and she verbalized understanding.  Per pharmacy recommendations, patient can hold coumadin 5 days prior to her upcoming dental procedure with plans to restart when cleared to do so by her dentist. This will be reiterated at her coumadin clinic visit 09/28/20.   I will route this recommendation to the requesting party via Epic fax function and remove from pre-op pool. Please call with questions.  Abigail Butts, PA-C 09/23/2020, 1:07 PM

## 2020-09-28 ENCOUNTER — Other Ambulatory Visit: Payer: Self-pay

## 2020-09-28 ENCOUNTER — Ambulatory Visit (INDEPENDENT_AMBULATORY_CARE_PROVIDER_SITE_OTHER): Payer: Medicare Other | Admitting: *Deleted

## 2020-09-28 DIAGNOSIS — Z5181 Encounter for therapeutic drug level monitoring: Secondary | ICD-10-CM

## 2020-09-28 DIAGNOSIS — I6789 Other cerebrovascular disease: Secondary | ICD-10-CM | POA: Diagnosis not present

## 2020-09-28 LAB — POCT INR: INR: 3.7 — AB (ref 2.0–3.0)

## 2020-09-28 NOTE — Patient Instructions (Addendum)
Description   Do not take any Warfarin today then continue taking Warfarin 1.5 tablets daily except for 1 tablet on Wednesdays and Fridays. Keep your 2 salads in your diet weekly. Recheck INR in 1 week post procedure. Coumadin Clinic# [phone] Main # [phone] with any problems new medications or if scheduled for any procedures.      09/29/20-this is your last dose then start holding your Warfarin for dental extractions.   You will resume taking Warfarin once the Dentist states it is okay to do so. You will resume your normal dose.  If it is held after your extractions call us at [phone].

## 2020-10-12 ENCOUNTER — Ambulatory Visit (INDEPENDENT_AMBULATORY_CARE_PROVIDER_SITE_OTHER): Payer: Medicare Other | Admitting: *Deleted

## 2020-10-12 ENCOUNTER — Other Ambulatory Visit: Payer: Self-pay

## 2020-10-12 DIAGNOSIS — I6789 Other cerebrovascular disease: Secondary | ICD-10-CM | POA: Diagnosis not present

## 2020-10-12 DIAGNOSIS — Z5181 Encounter for therapeutic drug level monitoring: Secondary | ICD-10-CM | POA: Diagnosis not present

## 2020-10-12 LAB — POCT INR: INR: 2.2 (ref 2.0–3.0)

## 2020-10-12 NOTE — Patient Instructions (Addendum)
Description   Continue taking Warfarin 1.5 tablets daily except for 1 tablet on Wednesdays and Fridays. Keep your 2 salads in your diet weekly. Recheck INR 2 weeks. Coumadin Clinic# [phone] Main # [phone] with any problems new medications or if scheduled for any procedures.

## 2020-10-26 ENCOUNTER — Ambulatory Visit (INDEPENDENT_AMBULATORY_CARE_PROVIDER_SITE_OTHER): Payer: Medicare Other | Admitting: *Deleted

## 2020-10-26 ENCOUNTER — Other Ambulatory Visit: Payer: Self-pay

## 2020-10-26 DIAGNOSIS — I6789 Other cerebrovascular disease: Secondary | ICD-10-CM

## 2020-10-26 DIAGNOSIS — Z5181 Encounter for therapeutic drug level monitoring: Secondary | ICD-10-CM

## 2020-10-26 LAB — POCT INR: INR: 2.1 (ref 2.0–3.0)

## 2020-10-26 NOTE — Patient Instructions (Addendum)
Description   Continue taking Warfarin 1.5 tablets daily except for 1 tablet on Wednesdays and Fridays. Keep your 2 salads in your diet weekly. Recheck INR 1 week post procedure. Coumadin Clinic# [phone] Main # [phone] with any problems new medications or if scheduled for any procedures.  Fax [phone]

## 2020-10-27 ENCOUNTER — Telehealth: Payer: Self-pay | Admitting: *Deleted

## 2020-10-27 NOTE — Telephone Encounter (Signed)
I s/w Dr. Nicki Reaper II, DDS office in regards to clearance request our office received yesterday. See clearance request from 09/01/20 for clearance for dental extractions. This request is for 3 teeth to be extracted at this time, so this is an actual new clearance request. Dental office states they were informed yesterday by Candance that a new clearance request would need to be sent. I assured the dental office that since pt was just cleared, should not be a problem; however I will enter new clearance and forward to our pre op team and Pharm-D for clearance. Advised we will fax clearance notes once pt has been cleared.      [facility] Pre-operative Risk Assessment    Patient Name: Renee Adams  DOB: 20-Sep-1933  MRN: [id number]   HEARTCARE STAFF: - Please ensure there is not already an duplicate clearance open for this procedure. - Under Visit Info/Reason for Call, type in Other and utilize the format Clearance MM/DD/YY or Clearance TBD. Do not use dashes or single digits. - If request is for dental extraction, please clarify the # of teeth to be extracted.  Request for surgical clearance:  1. What type of surgery is being performed? 3 TEETH TO BE EXTRACTED   2. When is this surgery scheduled? 11/03/20   3. What type of clearance is required (medical clearance vs. Pharmacy clearance to hold med vs. Both)? PHARM  4. Are there any medications that need to be held prior to surgery and how long? WARFARIN   5. Practice name and name of physician performing surgery? DR. Moss Mc II, DDS   6. What is the office phone number? [phone]   7.   What is the office fax number? [phone]  8.   Anesthesia type (None, local, MAC, general) ? LOCAL   Julaine Hua 10/27/2020, 9:07 AM  _________________________________________________________________   (provider comments below)

## 2020-10-27 NOTE — Telephone Encounter (Signed)
Patient with diagnosis of A Fib on warfarin for anticoagulation.    1. Procedure: 3 TEETH TO BE EXTRACTED   Date of procedure: 11/03/20   CHA2DS2-VASc Score = 6  This indicates a 9.7% annual risk of stroke. The patient's score is based upon: CHF History: No HTN History: Yes Diabetes History: No Stroke History: Yes Vascular Disease History: No Age Score: 2 Gender Score: 1   Patient was previously cleared to hold warfarin for 5 days and due to patient's frailty, Dr. Irish Lack authorized procedure to be completed without a bridge.  However this was to have 8 teeth extracted at one time which did not occur.    It is not ideal for patient to continuously be holding warfarin for 5 days every few weeks as this will make her repeatedly subtherapeutic.  Is there a reason dentist can not entire planned procedure at once?  Or remove one tooth at a time so she does not need to hold warfarin?

## 2020-10-27 NOTE — Telephone Encounter (Signed)
Clinical pharmacist to review Coumadin ?

## 2020-10-28 NOTE — Telephone Encounter (Signed)
    Renee Adams DOB:  [birth year]  MRN:  [id number]   Primary Cardiologist: Larae Grooms, MD  Chart reviewed as part of pre-operative protocol coverage.   Per pharmacy recommendation, patient can hold coumadin 5 days prior to her upcoming dental procedure with plans to restart as soon as she is cleared to do so by her dentist.   I will route this recommendation to the requesting party via [place] fax function and remove from pre-op pool.  Please call with questions.  Callback: - Please notify the patient to begin holding coumadin 10/29/20 in anticipation of her 11/03/20 procedure. Thank you!  Abigail Butts, PA-C 10/28/2020, 4:22 PM

## 2020-10-28 NOTE — Telephone Encounter (Signed)
Spoke with patient. Patient made aware to begin holding her coumadin tomorrow 10/29/20. Pt verbalized understanding and thanked me for the call.

## 2020-10-28 NOTE — Telephone Encounter (Signed)
Unfortunately if patient is going to hold 5 days of warfarin, she will need to start tomorrow.  So tonight would be her last dose.  Since we will not be able to get in touch with dental office until Monday the 25th, recommend calling patient and instructing to begin holding warfarin tomorrow.

## 2020-10-28 NOTE — Telephone Encounter (Signed)
Attempted to reach requesting office to obtain more information, office is closed on Fridays. Will try again Monday

## 2020-11-13 ENCOUNTER — Other Ambulatory Visit: Payer: Self-pay | Admitting: Oncology

## 2020-11-14 ENCOUNTER — Other Ambulatory Visit: Payer: Self-pay | Admitting: Interventional Cardiology

## 2020-11-14 ENCOUNTER — Telehealth: Payer: Self-pay | Admitting: Oncology

## 2020-11-14 ENCOUNTER — Other Ambulatory Visit: Payer: Self-pay

## 2020-11-14 DIAGNOSIS — Z5181 Encounter for therapeutic drug level monitoring: Secondary | ICD-10-CM

## 2020-11-14 DIAGNOSIS — I480 Paroxysmal atrial fibrillation: Secondary | ICD-10-CM

## 2020-11-14 NOTE — Progress Notes (Signed)
nti

## 2020-11-14 NOTE — Telephone Encounter (Signed)
Rescheduled appointment per 05/08 schedule message. Left a detailed message.

## 2020-11-18 ENCOUNTER — Ambulatory Visit (INDEPENDENT_AMBULATORY_CARE_PROVIDER_SITE_OTHER): Payer: Medicare Other | Admitting: *Deleted

## 2020-11-18 ENCOUNTER — Other Ambulatory Visit: Payer: Self-pay

## 2020-11-18 DIAGNOSIS — I6789 Other cerebrovascular disease: Secondary | ICD-10-CM

## 2020-11-18 DIAGNOSIS — I480 Paroxysmal atrial fibrillation: Secondary | ICD-10-CM | POA: Diagnosis not present

## 2020-11-18 DIAGNOSIS — Z5181 Encounter for therapeutic drug level monitoring: Secondary | ICD-10-CM | POA: Diagnosis not present

## 2020-11-18 LAB — POCT INR: INR: 2.4 (ref 2.0–3.0)

## 2020-11-18 NOTE — Patient Instructions (Addendum)
Description   Continue taking Warfarin 1.5 tablets daily except for 1 tablet on Wednesdays and Fridays. Keep your 2 salads in your diet weekly. Recheck INR 1 week post procedure. Coumadin Clinic# [phone] Main # [phone] with any problems new medications or if scheduled for any procedures.  Fax [phone]    Last dose of warfarin on 11/25/20 for dental extractions that were supposed to be done on 11/03/20.  Once you resume your warfarin take an extra 1/2 tablet then resume normal dose.

## 2020-12-16 ENCOUNTER — Ambulatory Visit (INDEPENDENT_AMBULATORY_CARE_PROVIDER_SITE_OTHER): Payer: Medicare Other | Admitting: *Deleted

## 2020-12-16 ENCOUNTER — Other Ambulatory Visit: Payer: Self-pay

## 2020-12-16 DIAGNOSIS — I6789 Other cerebrovascular disease: Secondary | ICD-10-CM | POA: Diagnosis not present

## 2020-12-16 DIAGNOSIS — I480 Paroxysmal atrial fibrillation: Secondary | ICD-10-CM

## 2020-12-16 DIAGNOSIS — Z5181 Encounter for therapeutic drug level monitoring: Secondary | ICD-10-CM

## 2020-12-16 LAB — POCT INR: INR: 2 (ref 2.0–3.0)

## 2020-12-16 NOTE — Patient Instructions (Signed)
Description   Continue taking Warfarin 1.5 tablets daily except for 1 tablet on Wednesdays and Fridays. Keep your 2 salads in your diet weekly. Recheck INR 1 week post procedure. [facility]# [phone] Main # [phone] with any problems new medications or if scheduled for any procedures.  Fax [phone]

## 2020-12-21 ENCOUNTER — Other Ambulatory Visit: Payer: Self-pay | Admitting: Oncology

## 2021-01-06 ENCOUNTER — Ambulatory Visit (INDEPENDENT_AMBULATORY_CARE_PROVIDER_SITE_OTHER): Payer: Medicare Other | Admitting: *Deleted

## 2021-01-06 ENCOUNTER — Other Ambulatory Visit: Payer: Self-pay

## 2021-01-06 DIAGNOSIS — Z5181 Encounter for therapeutic drug level monitoring: Secondary | ICD-10-CM | POA: Diagnosis not present

## 2021-01-06 DIAGNOSIS — I480 Paroxysmal atrial fibrillation: Secondary | ICD-10-CM | POA: Diagnosis not present

## 2021-01-06 LAB — POCT INR: INR: 1.9 — AB (ref 2.0–3.0)

## 2021-01-06 NOTE — Patient Instructions (Signed)
Description   Take 1.5 tablets today of warfarin, then continue taking Warfarin 1.5 tablets daily except for 1 tablet on Wednesdays and Fridays. Keep consistent with your vitamin K intake. Recheck INR in 2 weeks. [facility] # [phone] Main # [phone] with any problems new medications or if scheduled for any procedures.  Fax [phone]

## 2021-01-10 ENCOUNTER — Telehealth: Payer: Self-pay | Admitting: *Deleted

## 2021-01-10 NOTE — Telephone Encounter (Signed)
I s/w dental office and confirmed this is anew clearance request as pt has been having dental Tx in multiple steps. See clearance note.     [facility] Pre-operative Risk Assessment    Patient Name: KESHAWNA DIX  DOB: Jul 22, 1933  MRN: [id number]   HEARTCARE STAFF: - Please ensure there is not already an duplicate clearance open for this procedure. - Under Visit Info/Reason for Call, type in Other and utilize the format Clearance MM/DD/YY or Clearance TBD. Do not use dashes or single digits. - If request is for dental extraction, please clarify the # of teeth to be extracted. - If the patient is currently at the dentist's office, call Pre-Op APP to address. If the patient is not currently in the dentist office, please route to the Pre-Op pool  Request for surgical clearance:  What type of surgery is being performed? 3 TEETH TO BE EXTRACTED   When is this surgery scheduled? 01/26/21   What type of clearance is required (medical clearance vs. Pharmacy clearance to hold med vs. Both)? PHARM  Are there any medications that need to be held prior to surgery and how long? Vander name and name of physician performing surgery? DR. Moss Mc II, DDS   What is the office phone number? [phone]   7.   What is the office fax number? [phone]  8.   Anesthesia type (None, local, MAC, general) ? SEPTA CAINE      Julaine Hua 01/10/2021, 4:23 PM  _________________________________________________________________   (provider comments below)

## 2021-01-10 NOTE — Telephone Encounter (Signed)
Debbie from Dr Jaquita Rector. [place] office is returning a call to [place].

## 2021-01-10 NOTE — Telephone Encounter (Signed)
Left message for dental office to please call back and confirm if this is a new clearance request or is this in regards to the clearance request from 10/2020? Also if new, how many teeth are to be extracted as extractions is checked off on the form, will also need to know what type of anesthesia to be used if any. Will wait to hear back from DDS office.

## 2021-01-10 NOTE — Telephone Encounter (Signed)
Patient with diagnosis of afib on warfarin for anticoagulation.    Procedure: 3 teeth extraction Date of procedure: 01/26/21  CHA2DS2-VASc Score = 6  This indicates a 9.7% annual risk of stroke. The patient's score is based upon: CHF History: No HTN History: Yes Diabetes History: No Stroke History: Yes Vascular Disease History: No Age Score: 2 Gender Score: 1     Patient has a hx of CVA and would need bridge. Dr. Esmeralda Links are your thoughts on holding 2-3 days without a bridge? Or asking to pull 2 teeth and then the other tooth at another time to avoid having to hold warfarin?

## 2021-01-11 NOTE — Telephone Encounter (Addendum)
   Patient Name: Renee Adams  DOB: [birth year]  MRN: [id number]   Primary Cardiologist: Larae Grooms, MD  Chart reviewed as part of pre-operative protocol coverage. Patient was last seen 01/2020 by Dr. Irish Lack (has 1 year f/u scheduled in August 2022) - h/o CVA, HLD, HTN, LEE, remote echo with atrial septal aneurysm/cannot exclude PFO, no prior valvular or structural repairs.  Simple dental extractions are considered low risk procedures per guidelines and generally do not require any specific cardiac clearance. SBE prophylaxis is not required for the patient from a cardiac standpoint.  Our pharmacist reviewed anticoagulation plan with Dr. Irish Lack as we were requested to hold warfarin prior to dental extraction. Per Dr. Irish Lack, [place] to hold for 2-3 days (without bridging with Lovenox). We typically advise that blood thinners be resumed when felt safe by performing physician. Tried to call pt but got voicemail. She has an anticoagulation appointment in our office on 01/20/21 at which time these instructions can be reiterated to her (info is in the appt notes).  I will route this recommendation to the requesting party via Epic fax function and remove from pre-op pool.  Please call with questions.  Charlie Pitter, PA-C 01/11/2021, 1:13 PM

## 2021-01-11 NOTE — Telephone Encounter (Signed)
Patient does NOT need SBE prophylaxis

## 2021-01-25 ENCOUNTER — Telehealth: Payer: Self-pay | Admitting: *Deleted

## 2021-01-25 ENCOUNTER — Other Ambulatory Visit: Payer: Medicare Other

## 2021-01-25 ENCOUNTER — Ambulatory Visit: Payer: Medicare Other | Admitting: Oncology

## 2021-01-25 NOTE — Telephone Encounter (Signed)
Pt overdue to have her INR checked. Called and spoke to pt who stated that she missed her appointment on 7/15 because she did not think it was necessary. Pt stated she is suppose to have teeth removed on 7/21 and has been holding warfarin 5 days prior to procedure because that is what she had done in the past. Pt's procedure tomorrow. Instructed pt that when she resumes her warfarin she should take and extra 1/2 tablet of warfarin along with her normal dose for two days. Pt verbalized understanding. Rescheduled pt to come in on 02/02/2021 to have INR checked.

## 2021-02-07 ENCOUNTER — Inpatient Hospital Stay: Payer: Medicare Other | Attending: Oncology

## 2021-02-07 ENCOUNTER — Inpatient Hospital Stay (HOSPITAL_BASED_OUTPATIENT_CLINIC_OR_DEPARTMENT_OTHER): Payer: Medicare Other | Admitting: Oncology

## 2021-02-07 ENCOUNTER — Other Ambulatory Visit: Payer: Self-pay

## 2021-02-07 VITALS — BP 155/72 | HR 85 | Temp 98.1°F | Resp 18 | Ht 63.0 in | Wt 147.1 lb

## 2021-02-07 DIAGNOSIS — Z853 Personal history of malignant neoplasm of breast: Secondary | ICD-10-CM | POA: Insufficient documentation

## 2021-02-07 DIAGNOSIS — R29898 Other symptoms and signs involving the musculoskeletal system: Secondary | ICD-10-CM

## 2021-02-07 DIAGNOSIS — M79643 Pain in unspecified hand: Secondary | ICD-10-CM | POA: Insufficient documentation

## 2021-02-07 DIAGNOSIS — C50812 Malignant neoplasm of overlapping sites of left female breast: Secondary | ICD-10-CM | POA: Diagnosis not present

## 2021-02-07 DIAGNOSIS — Z8673 Personal history of transient ischemic attack (TIA), and cerebral infarction without residual deficits: Secondary | ICD-10-CM | POA: Insufficient documentation

## 2021-02-07 DIAGNOSIS — Z170 Estrogen receptor positive status [ER+]: Secondary | ICD-10-CM | POA: Diagnosis not present

## 2021-02-07 DIAGNOSIS — Z8249 Family history of ischemic heart disease and other diseases of the circulatory system: Secondary | ICD-10-CM | POA: Diagnosis not present

## 2021-02-07 DIAGNOSIS — Z8261 Family history of arthritis: Secondary | ICD-10-CM | POA: Insufficient documentation

## 2021-02-07 DIAGNOSIS — Z833 Family history of diabetes mellitus: Secondary | ICD-10-CM | POA: Diagnosis not present

## 2021-02-07 DIAGNOSIS — Z803 Family history of malignant neoplasm of breast: Secondary | ICD-10-CM | POA: Insufficient documentation

## 2021-02-07 DIAGNOSIS — Z96642 Presence of left artificial hip joint: Secondary | ICD-10-CM

## 2021-02-07 DIAGNOSIS — M79673 Pain in unspecified foot: Secondary | ICD-10-CM | POA: Diagnosis not present

## 2021-02-07 DIAGNOSIS — Z809 Family history of malignant neoplasm, unspecified: Secondary | ICD-10-CM | POA: Insufficient documentation

## 2021-02-07 DIAGNOSIS — M869 Osteomyelitis, unspecified: Secondary | ICD-10-CM

## 2021-02-07 LAB — CBC WITH DIFFERENTIAL/PLATELET
Abs Immature Granulocytes: 0.01 10*3/uL (ref 0.00–0.07)
Basophils Absolute: 0.1 10*3/uL (ref 0.0–0.1)
Basophils Relative: 2 %
Eosinophils Absolute: 0.3 10*3/uL (ref 0.0–0.5)
Eosinophils Relative: 3 %
HCT: 41.1 % (ref 36.0–46.0)
Hemoglobin: 13.9 g/dL (ref 12.0–15.0)
Immature Granulocytes: 0 %
Lymphocytes Relative: 23 %
Lymphs Abs: 1.8 10*3/uL (ref 0.7–4.0)
MCH: 29.7 pg (ref 26.0–34.0)
MCHC: 33.8 g/dL (ref 30.0–36.0)
MCV: 87.8 fL (ref 80.0–100.0)
Monocytes Absolute: 0.5 10*3/uL (ref 0.1–1.0)
Monocytes Relative: 6 %
Neutro Abs: 5.1 10*3/uL (ref 1.7–7.7)
Neutrophils Relative %: 66 %
Platelets: 211 10*3/uL (ref 150–400)
RBC: 4.68 MIL/uL (ref 3.87–5.11)
RDW: 14.5 % (ref 11.5–15.5)
WBC: 7.9 10*3/uL (ref 4.0–10.5)
nRBC: 0 % (ref 0.0–0.2)

## 2021-02-07 LAB — COMPREHENSIVE METABOLIC PANEL
ALT: 14 U/L (ref 0–44)
AST: 22 U/L (ref 15–41)
Albumin: 4.1 g/dL (ref 3.5–5.0)
Alkaline Phosphatase: 84 U/L (ref 38–126)
Anion gap: 8 (ref 5–15)
BUN: 21 mg/dL (ref 8–23)
CO2: 25 mmol/L (ref 22–32)
Calcium: 9.8 mg/dL (ref 8.9–10.3)
Chloride: 109 mmol/L (ref 98–111)
Creatinine, Ser: 0.86 mg/dL (ref 0.44–1.00)
GFR, Estimated: >60 mL/min (ref >60)
Glucose, Bld: 84 mg/dL (ref 70–99)
Potassium: 4 mmol/L (ref 3.5–5.1)
Sodium: 142 mmol/L (ref 135–145)
Total Bilirubin: 0.6 mg/dL (ref 0.3–1.2)
Total Protein: 6.9 g/dL (ref 6.5–8.1)

## 2021-02-07 MED ORDER — ACETAMINOPHEN 500 MG PO TABS: 500.0000 mg | ORAL_TABLET | Freq: Three times a day (TID) | ORAL | 12 refills | Status: AC | PRN

## 2021-02-07 MED ORDER — GABAPENTIN 100 MG PO CAPS: 300.0000 mg | ORAL_CAPSULE | Freq: Three times a day (TID) | ORAL | 3 refills | Status: DC | PRN

## 2021-02-07 NOTE — Progress Notes (Signed)
[facility]  Telephone:([phone] [phone]     ID: Renee Adams DOB: Jan 10, 1934  MR#: [id number]  CSN#: [id number]  Patient Care Team: Alroy Dust, Carlean Jews.[name], MD as PCP - General (Family Medicine) Jettie Booze, MD as PCP - Cardiology (Cardiology) Bo Merino, MD as Consulting Physician (Rheumatology) Fabio Wah, Virgie Dad, MD as Consulting Physician (Oncology) OTHER MD:  CHIEF COMPLAINT: Remote Hx of Left Breast Cancer  CURRENT TREATMENT: observation   INTERIM HISTORY: Renee Adams returns today for follow-up of her remote history of left breast cancer. She continues on observation alone.  Since her last visit, she underwent bilateral screening mammography with tomography at [facility] on 09/15/2020 showing: breast density category B; no evidence of malignancy in either breast.    REVIEW OF SYSTEMS: Renee Adams is having significant pain in her hands and feet.  She did see Renee Adams who felt according to the patient that this was not immune related.  She gave her some tramadol as a courtesy but felt it would not be a good idea to continue that and referred her back to [place] for pain management.  Renee Adams has also lost about 8 teeth in the recent past.  Her gums are still quite irritated and she is having some difficulty with nutrition.  She is using boost every day.  Aside from these issues a detailed review of systems today was stable   COVID 19 VACCINATION STATUS: Renee Adams x2, most recently 09/2019   BREAST CANCER HISTORY: Renee Adams was originally diagnosed with a left breast carcinoma in early 1996. Tumor was T1, N0, ER and PR positive. She underwent left lumpectomy and sentinel lymph node dissection in March of 1996.  Patient was treated with tamoxifen for 5 years, then Evista for 3 years. The Evsita was then discontinued secondary to history of stroke.   Currently she is being followed with observation alone.   PAST MEDICAL HISTORY Past Medical History:   Diagnosis Date   ASD (atrial septal defect)    Cancer (HCC)    breast, left   Carpal tunnel syndrome, bilateral    Depression    Gait disorder    GERD (gastroesophageal reflux disease)    Glaucoma    Glaucoma    History of stroke    Hypertension    IBS (irritable bowel syndrome)    Personal history of radiation therapy 1996   S/P bunionectomy    bilateral    PAST SURGICAL HISTORY Past Surgical History:  Procedure Laterality Date   ABDOMINAL HYSTERECTOMY     bladder resuspension     BREAST LUMPECTOMY Left 1996   CATARACT EXTRACTION Bilateral    INCISION AND DRAINAGE HIP  03/31/2012   Procedure: IRRIGATION AND DEBRIDEMENT HIP WITH POLY EXCHANGE;  Surgeon: Rudean Haskell, MD;  Location: [facility];  Service: Orthopedics;  Laterality: Left;   ORIF FEMUR FRACTURE Right 11/30/2012   Procedure: OPEN REDUCTION INTERNAL FIXATION (ORIF) DISTAL FEMUR FRACTURE;  Surgeon: Vickey Huger, MD;  Location: [facility];  Service: Orthopedics;  Laterality: Right;   TOTAL HIP ARTHROPLASTY Bilateral    TOTAL KNEE ARTHROPLASTY Bilateral     FAMILY HISTORY Family History  Problem Relation Age of Onset   Cancer Mother    Breast cancer Mother    Heart failure Father    Diabetes Sister    Osteoarthritis Brother    Breast cancer Maternal Aunt    Heart attack Neg Hx     SOCIAL HISTORY: 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  She is widowed. She lives alone in her own home with  no pets. The patient's daughter [name] is a Materials engineer and lives in [place]. The patient's son [name] is an Designer, multimedia in [place]. The patient has 4 grandchildren.    ADVANCED DIRECTIVES: She has named both her children as healthcare powers of attorney   HEALTHCARE MAINTENANCE  Current Outpatient Medications  Medication Sig Dispense Refill   ALPRAZolam (XANAX) 0.25 MG tablet Take 4 tablets (1 mg total) by mouth every 4 (four) hours as needed for anxiety. 40 tablet 0   amLODipine (NORVASC) 2.5 MG tablet  Take 1 tablet (2.5 mg total) by mouth daily. 90 tablet 3   atorvastatin (LIPITOR) 10 MG tablet Take 1 tablet (10 mg total) by mouth daily. 90 tablet 3   buPROPion (WELLBUTRIN XL) 300 MG 24 hr tablet Take 1 tablet (300 mg total) by mouth daily. 30 tablet 1   calcium carbonate 200 MG capsule Take 3 capsules (600 mg total) by mouth 2 (two) times daily with a meal. 60 capsule 1   Cholecalciferol (VITAMIN D-1000 MAX ST) 1000 units tablet Take by mouth.     clobetasol (TEMOVATE) 0.05 % external solution Apply 1 application topically daily as needed.     EPINEPHrine 0.3 mg/0.3 mL IJ SOAJ injection Inject into the muscle.     ergocalciferol (VITAMIN D2) 50000 UNITS capsule Take 50,000 Units by mouth once a week.     furosemide (LASIX) 40 MG tablet Take 1 tablet (40 mg total) by mouth daily as needed. 90 tablet 3   ketoconazole (NIZORAL) 2 % cream APPLY TO AFFECTED AREA ON THE SKIN DAILY 15 g 0   latanoprost (XALATAN) 0.005 % ophthalmic solution Place 1 drop into both eyes at bedtime. 2.5 mL 12   rOPINIRole (REQUIP) 1 MG tablet Take 1 tablet (1 mg total) by mouth at bedtime as needed. For restless legs 30 tablet 0   sertraline (ZOLOFT) 100 MG tablet Take 1.5 tablets (150 mg total) by mouth daily. 30 tablet 1   traMADol (ULTRAM) 50 MG tablet Take 50 mg by mouth 2 (two) times daily.     warfarin (COUMADIN) 2 MG tablet TAKE AS DIRECTED BY COUMADIN CLINIC 45 tablet 1   No current facility-administered medications for this visit.    Allergies:  Allergies  Allergen Reactions   Dilaudid [Hydromorphone Hcl] Shortness Of Breath    Tolerates tramadol   Hydromorphone Other (See Comments)    UNKNOWN TO PATIENT   Morphine Other (See Comments)    UNKNOWN TO PATIENT   Morphine And Related Shortness Of Breath    Tolerates tramadol   Sulfamethoxazole Other (See Comments)    UNKNOWN TO PATIENT   Sulfa Antibiotics Other (See Comments)    "Made me drunk";  wobbly   Keflex [Cephalexin] Nausea Only   Levaquin  [Levofloxacin] Hives and Rash    Physical Exam: white woman using a Rollator  There were no vitals filed for this visit.  Wt Readings from Last 3 Encounters:  02/02/20 147 lb 9.6 oz (67 kg)  01/19/20 146 lb 6.4 oz (66.4 kg)  01/07/19 146 lb (66.2 kg)   There is no height or weight on file to calculate BMI.    ECOG FS:2 - Symptomatic, <50% confined to bed  Sclerae unicteric, EOMs intact Wearing a mask No cervical or supraclavicular adenopathy Lungs no rales or rhonchi Heart regular rate and rhythm Abd soft, nontender, positive bowel sounds MSK mild kyphosis but no focal  spinal tenderness, significant arthritis in both hands as imaged below  Neuro: nonfocal, well oriented, appropriate affect Breasts: The right breast is unremarkable.  The left breast is smaller than the right and it "hangs" differently.  This is expected.  There is some scar tissue which is also expected.  Both axillae are benign.   01/01/2019: Patient's hands     Lab Results: Lab Results  Component Value Date   WBC 6.0 01/19/2020   HGB 13.9 01/19/2020   HCT 42.0 01/19/2020   MCV 87.9 01/19/2020   PLT 227 01/19/2020   NEUTROABS 3.9 01/19/2020     Chemistry      Component Value Date/Time   NA 143 01/19/2020 1120   NA 142 10/11/2016 1242   K 3.8 01/19/2020 1120   K 4.4 10/11/2016 1242   CL 110 01/19/2020 1120   CL 109 (H) 09/10/2012 1315   CO2 24 01/19/2020 1120   CO2 24 10/11/2016 1242   BUN 13 01/19/2020 1120   BUN 15.6 10/11/2016 1242   CREATININE 0.85 01/19/2020 1120   CREATININE 1.11 (H) 01/01/2019 1500   CREATININE 0.9 10/11/2016 1242      Component Value Date/Time   CALCIUM 9.6 01/19/2020 1120   CALCIUM 9.8 10/11/2016 1242   ALKPHOS 85 01/19/2020 1120   ALKPHOS 103 10/11/2016 1242   AST 23 01/19/2020 1120   AST 20 01/01/2019 1500   AST 20 10/11/2016 1242   ALT 14 01/19/2020 1120   ALT 12 01/01/2019 1500   ALT 10 10/11/2016 1242   BILITOT 0.7 01/19/2020 1120   BILITOT 0.7  01/01/2019 1500   BILITOT 0.78 10/11/2016 1242      STUDIES: No results found.    Assessment:  87 y.o. [place] woman   (1)  status post left lumpectomy and sentinel lymph node dissection March 1996 for a T1, N0, ER/PR positive breast carcinoma.    (2)  Treated with tamoxifen for five years, then Evista for three years with the Evista discontinued secondary to stroke.    (3)  Currently being followed with observation alone.    Plan:  Loda is now a little over 26 years out from definitive surgery for her breast cancer with no evidence of disease recurrence.  She is likely cured.  She tells me she derives significant benefit in terms of anxiety and concerned by seeing [place] on a once a year basis and we are glad to continue that.  Today specifically we discussed the fact that her left breast being shorter and not hanging the same as her right as expected.  Radiation causes some scarring of the breast and of course some of the breast has also been removed.  I reassured her that what we are seeing is what we expect to see at this point.  She does have significant pain in her hands.  Control of her pain is complicated by the fact that she is on a blood thinner and so cannot take nonsteroidals.  I suggested she start with Tylenol 500 mg 3 times a day and if that does not work she can add low-dose gabapentin, 100 mg, up to 3 times a day as well.  If that does not control her pain as she will discuss with Dr. Alroy Dust and perhaps she will need a referral to a pain clinic  She will see [place] again in April 2023 after her March mammography.  She will have lab work the same day  Total encounter time 25 minutes.* She knows  to call for any other issue that may develop before that visit   Jody Silas, Virgie Dad, MD  02/07/21 9:52 AM Medical Oncology and Hematology [facility] [place], [place] [address] Tel. [phone]    Fax. [phone]   I, Wilburn Mylar, am acting as scribe for Dr. Virgie Dad. Nixie Laube.  I, Lurline Del MD, have reviewed the above documentation for accuracy and completeness, and I agree with the above.    *Total Encounter Time as defined by the Centers for Medicare and Medicaid Services includes, in addition to the face-to-face time of a patient visit (documented in the note above) non-face-to-face time: obtaining and reviewing outside history, ordering and reviewing medications, tests or procedures, care coordination (communications with other health care professionals or caregivers) and documentation in the medical record.

## 2021-02-08 ENCOUNTER — Telehealth: Payer: Self-pay | Admitting: Oncology

## 2021-02-08 NOTE — Telephone Encounter (Signed)
Scheduled appointment per 08/02 los. Patient is aware.

## 2021-02-10 ENCOUNTER — Telehealth: Payer: Self-pay | Admitting: *Deleted

## 2021-02-10 NOTE — Telephone Encounter (Signed)
Message relayed to this RN from another nurse VM - pt requested a return call.  This RN returned call - per verified number- with no answer.

## 2021-02-15 ENCOUNTER — Other Ambulatory Visit: Payer: Self-pay

## 2021-02-15 ENCOUNTER — Ambulatory Visit (INDEPENDENT_AMBULATORY_CARE_PROVIDER_SITE_OTHER): Payer: Medicare Other

## 2021-02-15 DIAGNOSIS — I480 Paroxysmal atrial fibrillation: Secondary | ICD-10-CM | POA: Diagnosis not present

## 2021-02-15 DIAGNOSIS — Z5181 Encounter for therapeutic drug level monitoring: Secondary | ICD-10-CM | POA: Diagnosis not present

## 2021-02-15 DIAGNOSIS — I6789 Other cerebrovascular disease: Secondary | ICD-10-CM | POA: Diagnosis not present

## 2021-02-15 DIAGNOSIS — Z7901 Long term (current) use of anticoagulants: Secondary | ICD-10-CM

## 2021-02-15 LAB — POCT INR: INR: 1.6 — AB (ref 2.0–3.0)

## 2021-02-15 NOTE — Patient Instructions (Signed)
Description   Take 1 1/2 tablets tonight and 2 tablets tomorrow and then continue taking Warfarin 1.5 tablets daily except for 1 tablet on Wednesdays and Fridays. Keep consistent with your vitamin K intake. Recheck INR in 1 week. Coumadin Clinic # [phone] Main # [phone] with any problems new medications or if scheduled for any procedures.  Fax [phone]

## 2021-03-02 ENCOUNTER — Encounter: Payer: Self-pay | Admitting: Interventional Cardiology

## 2021-03-02 ENCOUNTER — Ambulatory Visit (INDEPENDENT_AMBULATORY_CARE_PROVIDER_SITE_OTHER): Payer: Medicare Other | Admitting: *Deleted

## 2021-03-02 ENCOUNTER — Other Ambulatory Visit: Payer: Self-pay

## 2021-03-02 ENCOUNTER — Ambulatory Visit (INDEPENDENT_AMBULATORY_CARE_PROVIDER_SITE_OTHER): Payer: Medicare Other | Admitting: Interventional Cardiology

## 2021-03-02 VITALS — BP 104/60 | HR 87 | Ht 63.0 in | Wt 145.0 lb

## 2021-03-02 DIAGNOSIS — I480 Paroxysmal atrial fibrillation: Secondary | ICD-10-CM

## 2021-03-02 DIAGNOSIS — E782 Mixed hyperlipidemia: Secondary | ICD-10-CM | POA: Diagnosis not present

## 2021-03-02 DIAGNOSIS — I10 Essential (primary) hypertension: Secondary | ICD-10-CM

## 2021-03-02 DIAGNOSIS — Z7901 Long term (current) use of anticoagulants: Secondary | ICD-10-CM

## 2021-03-02 DIAGNOSIS — I6789 Other cerebrovascular disease: Secondary | ICD-10-CM

## 2021-03-02 DIAGNOSIS — Q211 Atrial septal defect, unspecified: Secondary | ICD-10-CM

## 2021-03-02 DIAGNOSIS — Z5181 Encounter for therapeutic drug level monitoring: Secondary | ICD-10-CM

## 2021-03-02 LAB — POCT INR: INR: 2.1 (ref 2.0–3.0)

## 2021-03-02 MED ORDER — AMLODIPINE BESYLATE 2.5 MG PO TABS: 2.5000 mg | ORAL_TABLET | Freq: Every day | ORAL | 3 refills | Status: DC

## 2021-03-02 MED ORDER — ATORVASTATIN CALCIUM 10 MG PO TABS: 10.0000 mg | ORAL_TABLET | Freq: Every day | ORAL | 3 refills | Status: DC

## 2021-03-02 MED ORDER — WARFARIN SODIUM 2 MG PO TABS: ORAL_TABLET | ORAL | 1 refills | Status: DC

## 2021-03-02 MED ORDER — TRAMADOL HCL 50 MG PO TABS
50.0000 mg | ORAL_TABLET | Freq: Two times a day (BID) | ORAL | 0 refills | Status: AC | PRN
Start: 2021-03-02 — End: 2021-04-01

## 2021-03-02 NOTE — Progress Notes (Signed)
Cardiology Office Note   Date:  03/02/2021   ID:  Renee Adams, DOB Dec 23, 1933, MRN [id number]  PCP:  Aurea Graff.[name], MD    No chief complaint on file.  CVA/PFO  Wt Readings from Last 3 Encounters:  03/02/21 145 lb (65.8 kg)  02/07/21 147 lb 1.6 oz (66.7 kg)  02/02/20 147 lb 9.6 oz (67 kg)       History of Present Illness: Renee Adams is a 87 y.o. female   with prior CVA- possible PFO from prior echo:  Long term Coumadin for stroke prevention, started many years ago by Dr. Jaci Standard.    2012 echo showed Normal LV function, atrial septal aneurysm.   Walking limited by arthritis.   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  RA ruled out by Dr. Lenna Gilford.      Past Medical History:  Diagnosis Date   ASD (atrial septal defect)    Cancer (HCC)    breast, left   Carpal tunnel syndrome, bilateral    Depression    Gait disorder    GERD (gastroesophageal reflux disease)    Glaucoma    Glaucoma    History of stroke    Hypertension    IBS (irritable bowel syndrome)    Personal history of radiation therapy 1996   S/P bunionectomy    bilateral    Past Surgical History:  Procedure Laterality Date   ABDOMINAL HYSTERECTOMY     bladder resuspension     BREAST LUMPECTOMY Left 1996   CATARACT EXTRACTION Bilateral    INCISION AND DRAINAGE HIP  03/31/2012   Procedure: IRRIGATION AND DEBRIDEMENT HIP WITH POLY EXCHANGE;  Surgeon: Rudean Haskell, MD;  Location: [facility];  Service: Orthopedics;  Laterality: Left;   ORIF FEMUR FRACTURE Right 11/30/2012   Procedure: OPEN REDUCTION INTERNAL FIXATION (ORIF) DISTAL FEMUR FRACTURE;  Surgeon: Vickey Huger, MD;  Location: [facility];  Service: Orthopedics;  Laterality: Right;   TOTAL HIP ARTHROPLASTY Bilateral    TOTAL KNEE ARTHROPLASTY Bilateral      Current Outpatient Medications  Medication Sig Dispense Refill   acetaminophen (TYLENOL) 500 MG tablet Take 1 tablet (500 mg total) by mouth every 8  (eight) hours as needed. 90 tablet 12   ALPRAZolam (XANAX) 0.25 MG tablet Take 4 tablets (1 mg total) by mouth every 4 (four) hours as needed for anxiety. 40 tablet 0   calcium carbonate 200 MG capsule Take 3 capsules (600 mg total) by mouth 2 (two) times daily with a meal. 60 capsule 1   Cholecalciferol 25 MCG (1000 UT) tablet Take by mouth.     EPINEPHrine 0.3 mg/0.3 mL IJ SOAJ injection Inject into the muscle.     ergocalciferol (VITAMIN D2) 50000 UNITS capsule Take 50,000 Units by mouth once a week.     ketoconazole (NIZORAL) 2 % cream APPLY TO AFFECTED AREA ON THE SKIN DAILY 15 g 0   latanoprost (XALATAN) 0.005 % ophthalmic solution Place 1 drop into both eyes at bedtime. 2.5 mL 12   rOPINIRole (REQUIP) 1 MG tablet Take 1 tablet (1 mg total) by mouth at bedtime as needed. For restless legs 30 tablet 0   traMADol (ULTRAM) 50 MG tablet Take 1 tablet (50 mg total) by mouth 2 (two) times daily as needed. 60 tablet 0   amLODipine (NORVASC) 2.5 MG tablet Take 1 tablet (2.5 mg total) by mouth daily. 90 tablet 3   atorvastatin (LIPITOR) 10 MG tablet Take  1 tablet (10 mg total) by mouth daily. 90 tablet 3   buPROPion (WELLBUTRIN XL) 300 MG 24 hr tablet Take 1 tablet (300 mg total) by mouth daily. (Patient not taking: Reported on 03/02/2021) 30 tablet 1   clobetasol (TEMOVATE) 0.05 % external solution Apply 1 application topically daily as needed. (Patient not taking: Reported on 03/02/2021)     furosemide (LASIX) 40 MG tablet Take 1 tablet (40 mg total) by mouth daily as needed. (Patient not taking: Reported on 03/02/2021) 90 tablet 3   gabapentin (NEURONTIN) 100 MG capsule Take 3 capsules (300 mg total) by mouth every 8 (eight) hours as needed. (Patient not taking: Reported on 03/02/2021) 90 capsule 3   sertraline (ZOLOFT) 100 MG tablet Take 1.5 tablets (150 mg total) by mouth daily. (Patient not taking: Reported on 03/02/2021) 30 tablet 1   warfarin (COUMADIN) 2 MG tablet Take as directed by  Anticoagulation Clinic. 45 tablet 1   No current facility-administered medications for this visit.    Allergies:   Dilaudid [hydromorphone hcl], Hydromorphone, Morphine, Morphine and related, Sulfamethoxazole, Sulfa antibiotics, Keflex [cephalexin], and Levaquin [levofloxacin]    Social History:  The patient  reports that she has never smoked. She has never used smokeless tobacco. She reports that she does not drink alcohol and does not use drugs.   Family History:  The patient's family history includes Breast cancer in her maternal aunt and mother; Cancer in her mother; Diabetes in her sister; Heart failure in her father; Osteoarthritis in her brother.    ROS:  Please see the history of present illness.   Otherwise, review of systems are positive for pain in hands.   All other systems are reviewed and negative.    PHYSICAL EXAM: VS:  BP 104/60   Pulse 87   Ht '5\' 3"'$  (1.6 m)   Wt 145 lb (65.8 kg)   SpO2 92%   BMI 25.69 kg/m  , BMI Body mass index is 25.69 kg/m. GEN: Well nourished, well developed, in no acute distress HEENT: normal Neck: no JVD, carotid bruits, or masses Cardiac: RRR; no murmurs, rubs, or gallops,no edema  Respiratory:  clear to auscultation bilaterally, normal work of breathing GI: soft, nontender, nondistended, + BS MS: bilateral hand deformities Skin: warm and dry, no rash Neuro:  Strength and sensation are intact Psych: euthymic mood, full affect   EKG:   The ekg ordered today demonstrates NSR, no ST changes   Recent Labs: 03/08/2020: TSH 1.830 02/07/2021: ALT 14; BUN 21; Creatinine, Ser 0.86; Hemoglobin 13.9; Platelets 211; Potassium 4.0; Sodium 142   Lipid Panel    Component Value Date/Time   CHOL 147 03/08/2020 1555   TRIG 98 03/08/2020 1555   HDL 60 03/08/2020 1555   CHOLHDL 2.5 03/08/2020 1555   CHOLHDL 4 01/11/2014 1022   VLDL 31.2 01/11/2014 1022   LDLCALC 69 03/08/2020 1555     Other studies Reviewed: Additional studies/ records that  were reviewed today with results demonstrating: labs reviewed.   ASSESSMENT AND PLAN:  Prior CVA: On COumadin.  No recent falls. INR stable.  If bleeding increases, may need to rethink Coumadin.  For now, no bleeding issues. Hyperlipidemia: LDL 69 in 2021.  Followed by PMD. HTN: The current medical regimen is effective;  continue present plan and medications. LE edema: stable.  Anticoagulated: I NR to be checked today. Pain control: She states Dr. Trudie Reed asked her to f/u with PMD for tramadol.  Will give her a one month supply until  she can see Dr. Alroy Dust, as a courtesy.   Current medicines are reviewed at length with the patient today.  The patient concerns regarding her medicines were addressed.  The following changes have been made:  No change  Labs/ tests ordered today include:   Orders Placed This Encounter  Procedures   EKG 12-Lead    Recommend 150 minutes/week of aerobic exercise Low fat, low carb, high fiber diet recommended  Disposition:   FU in 1 year   Signed, Larae Grooms, MD  03/02/2021 3:00 PM    [facility] [place], [place],   [address] Phone: [phone]; Fax: [phone]

## 2021-03-02 NOTE — Patient Instructions (Signed)
Medication Instructions:  Your physician has recommended you make the following change in your medication:  TAKE: Tramadol 50 mg by mouth twice daily as needed for 30 days  *If you need a refill on your cardiac medications before your next appointment, please call your pharmacy*   Lab Work: NONE If you have labs (blood work) drawn today and your tests are completely normal, you will receive your results only by: [place] (if you have MyChart) OR A paper copy in the mail If you have any lab test that is abnormal or we need to change your treatment, we will call you to review the results.   Testing/Procedures: NONE   Follow-Up: At [facility], you and your health needs are our priority.  As part of our continuing mission to provide you with exceptional heart care, we have created designated Provider Care Teams.  These Care Teams include your primary Cardiologist (physician) and Advanced Practice Providers (APPs -  Physician Assistants and Nurse Practitioners) who all work together to provide you with the care you need, when you need it.    Your next appointment:   12 month(s)  The format for your next appointment:   In Person  Provider:   You may see Larae Grooms, MD or one of the following Advanced Practice Providers on your designated Care Team:   Melina Copa, PA-C Ermalinda Barrios, PA-C

## 2021-03-02 NOTE — Patient Instructions (Signed)
Description   Continue taking Warfarin 1.5 tablets daily except for 1 tablet on Wednesdays and Fridays. Keep consistent with your vitamin K intake. Recheck INR in 3 weeks. Coumadin Clinic # [phone] Main # [phone] with any problems new medications or if scheduled for any procedures.  Fax [phone]

## 2021-03-22 ENCOUNTER — Other Ambulatory Visit: Payer: Self-pay

## 2021-03-22 ENCOUNTER — Ambulatory Visit (INDEPENDENT_AMBULATORY_CARE_PROVIDER_SITE_OTHER): Payer: Medicare Other | Admitting: *Deleted

## 2021-03-22 DIAGNOSIS — Z5181 Encounter for therapeutic drug level monitoring: Secondary | ICD-10-CM

## 2021-03-22 DIAGNOSIS — I6789 Other cerebrovascular disease: Secondary | ICD-10-CM

## 2021-03-22 DIAGNOSIS — I480 Paroxysmal atrial fibrillation: Secondary | ICD-10-CM | POA: Diagnosis not present

## 2021-03-22 LAB — POCT INR: INR: 2.9 (ref 2.0–3.0)

## 2021-03-22 NOTE — Patient Instructions (Signed)
Description   Continue taking Warfarin 1.5 tablets daily except for 1 tablet on Wednesdays and Fridays. Keep consistent with your vitamin K intake. Recheck INR in 4 weeks. Coumadin Clinic # [phone] Main # [phone] with any problems new medications or if scheduled for any procedures.  Fax [phone]

## 2021-04-25 ENCOUNTER — Ambulatory Visit (INDEPENDENT_AMBULATORY_CARE_PROVIDER_SITE_OTHER): Payer: Medicare Other

## 2021-04-25 ENCOUNTER — Other Ambulatory Visit: Payer: Self-pay

## 2021-04-25 DIAGNOSIS — I480 Paroxysmal atrial fibrillation: Secondary | ICD-10-CM | POA: Diagnosis not present

## 2021-04-25 DIAGNOSIS — Z5181 Encounter for therapeutic drug level monitoring: Secondary | ICD-10-CM

## 2021-04-25 DIAGNOSIS — I6789 Other cerebrovascular disease: Secondary | ICD-10-CM | POA: Diagnosis not present

## 2021-04-25 LAB — POCT INR: INR: 3.3 — AB (ref 2.0–3.0)

## 2021-04-25 NOTE — Patient Instructions (Signed)
Description   Skip today's dosage of Warfarin, then resume same dosage of Warfarin 1.5 tablets daily except for 1 tablet on Wednesdays and Fridays. Keep consistent with your vitamin K intake. Recheck INR in 3 weeks. Coumadin Clinic # [phone] Main # [phone] with any problems new medications or if scheduled for any procedures.  Fax [phone]

## 2021-05-05 ENCOUNTER — Other Ambulatory Visit: Payer: Self-pay | Admitting: Interventional Cardiology

## 2021-05-05 DIAGNOSIS — I480 Paroxysmal atrial fibrillation: Secondary | ICD-10-CM

## 2021-05-05 DIAGNOSIS — Z5181 Encounter for therapeutic drug level monitoring: Secondary | ICD-10-CM

## 2021-05-05 NOTE — Telephone Encounter (Signed)
Prescription refill request received for warfarin Lov: 03/02/21 Renee Adams)  Next INR check: 05/16/21 Warfarin tablet strength: 2mg   Appropriate dose and refill sent to requested pharmacy.

## 2021-05-16 ENCOUNTER — Ambulatory Visit (INDEPENDENT_AMBULATORY_CARE_PROVIDER_SITE_OTHER): Payer: Medicare Other | Admitting: *Deleted

## 2021-05-16 ENCOUNTER — Other Ambulatory Visit: Payer: Self-pay

## 2021-05-16 DIAGNOSIS — Z5181 Encounter for therapeutic drug level monitoring: Secondary | ICD-10-CM

## 2021-05-16 DIAGNOSIS — I6789 Other cerebrovascular disease: Secondary | ICD-10-CM | POA: Diagnosis not present

## 2021-05-16 DIAGNOSIS — I480 Paroxysmal atrial fibrillation: Secondary | ICD-10-CM

## 2021-05-16 LAB — POCT INR: INR: 4.3 — AB (ref 2.0–3.0)

## 2021-05-16 NOTE — Patient Instructions (Signed)
Description   Do not take any Warfarin today and take 1/2 tablet tomorrow, then start taking Warfarin 1.5 tablets daily except for 1 tablet on Mondays, Wednesdays, and Fridays. Keep consistent with your vitamin K intake. Recheck INR in 3 weeks. Coumadin Clinic # [phone] Main # [phone] with any problems new medications or if scheduled for any procedures.  Fax [phone]

## 2021-05-19 DIAGNOSIS — N898 Other specified noninflammatory disorders of vagina: Secondary | ICD-10-CM | POA: Diagnosis not present

## 2021-05-19 DIAGNOSIS — R300 Dysuria: Secondary | ICD-10-CM | POA: Diagnosis not present

## 2021-05-19 DIAGNOSIS — M159 Polyosteoarthritis, unspecified: Secondary | ICD-10-CM | POA: Diagnosis not present

## 2021-05-19 DIAGNOSIS — F321 Major depressive disorder, single episode, moderate: Secondary | ICD-10-CM | POA: Diagnosis not present

## 2021-05-19 DIAGNOSIS — Z23 Encounter for immunization: Secondary | ICD-10-CM | POA: Diagnosis not present

## 2021-06-14 ENCOUNTER — Other Ambulatory Visit: Payer: Self-pay

## 2021-06-14 ENCOUNTER — Ambulatory Visit (INDEPENDENT_AMBULATORY_CARE_PROVIDER_SITE_OTHER): Payer: Medicare Other | Admitting: *Deleted

## 2021-06-14 DIAGNOSIS — Z5181 Encounter for therapeutic drug level monitoring: Secondary | ICD-10-CM | POA: Diagnosis not present

## 2021-06-14 DIAGNOSIS — I480 Paroxysmal atrial fibrillation: Secondary | ICD-10-CM | POA: Diagnosis not present

## 2021-06-14 DIAGNOSIS — I6789 Other cerebrovascular disease: Secondary | ICD-10-CM | POA: Diagnosis not present

## 2021-06-14 LAB — POCT INR: INR: 1.6 — AB (ref 2.0–3.0)

## 2021-06-14 NOTE — Patient Instructions (Signed)
Description   Today take 1.5 tablets then continue taking Warfarin 1.5 tablets daily except for 1 tablet on Mondays, Wednesdays, and Fridays. Keep consistent with your vitamin K intake. Recheck INR in 3 weeks. Coumadin Clinic # [phone] Main # [phone] with any problems new medications or if scheduled for any procedures.  Fax [phone]

## 2021-06-19 DIAGNOSIS — M7918 Myalgia, other site: Secondary | ICD-10-CM | POA: Diagnosis not present

## 2021-06-19 DIAGNOSIS — M5416 Radiculopathy, lumbar region: Secondary | ICD-10-CM | POA: Diagnosis not present

## 2021-06-19 DIAGNOSIS — Z96642 Presence of left artificial hip joint: Secondary | ICD-10-CM | POA: Diagnosis not present

## 2021-06-19 DIAGNOSIS — M25552 Pain in left hip: Secondary | ICD-10-CM | POA: Diagnosis not present

## 2021-07-06 DIAGNOSIS — Z961 Presence of intraocular lens: Secondary | ICD-10-CM | POA: Diagnosis not present

## 2021-07-06 DIAGNOSIS — H524 Presbyopia: Secondary | ICD-10-CM | POA: Diagnosis not present

## 2021-07-06 DIAGNOSIS — H401133 Primary open-angle glaucoma, bilateral, severe stage: Secondary | ICD-10-CM | POA: Diagnosis not present

## 2021-07-19 ENCOUNTER — Ambulatory Visit (INDEPENDENT_AMBULATORY_CARE_PROVIDER_SITE_OTHER): Payer: Medicare Other | Admitting: *Deleted

## 2021-07-19 ENCOUNTER — Other Ambulatory Visit: Payer: Self-pay

## 2021-07-19 DIAGNOSIS — Z5181 Encounter for therapeutic drug level monitoring: Secondary | ICD-10-CM

## 2021-07-19 DIAGNOSIS — I480 Paroxysmal atrial fibrillation: Secondary | ICD-10-CM | POA: Diagnosis not present

## 2021-07-19 DIAGNOSIS — I6789 Other cerebrovascular disease: Secondary | ICD-10-CM

## 2021-07-19 LAB — POCT INR: INR: 3 (ref 2.0–3.0)

## 2021-07-19 NOTE — Patient Instructions (Signed)
Description   Continue taking Warfarin 1.5 tablets daily except for 1 tablet on Mondays, Wednesdays, and Fridays. Keep consistent with your vitamin K intake. Recheck INR in 4 weeks. Coumadin Clinic # [phone] Main # [phone] with any problems new medications or if scheduled for any procedures.  Fax [phone]

## 2021-08-07 ENCOUNTER — Other Ambulatory Visit: Payer: Self-pay | Admitting: Interventional Cardiology

## 2021-08-07 DIAGNOSIS — I480 Paroxysmal atrial fibrillation: Secondary | ICD-10-CM

## 2021-08-07 DIAGNOSIS — Z5181 Encounter for therapeutic drug level monitoring: Secondary | ICD-10-CM

## 2021-08-07 NOTE — Telephone Encounter (Signed)
Warfarin refill sent in as requested.

## 2021-08-15 DIAGNOSIS — L84 Corns and callosities: Secondary | ICD-10-CM | POA: Diagnosis not present

## 2021-08-15 DIAGNOSIS — L821 Other seborrheic keratosis: Secondary | ICD-10-CM | POA: Diagnosis not present

## 2021-08-15 DIAGNOSIS — C44319 Basal cell carcinoma of skin of other parts of face: Secondary | ICD-10-CM | POA: Diagnosis not present

## 2021-08-16 ENCOUNTER — Encounter

## 2021-08-16 ENCOUNTER — Emergency Department (HOSPITAL_COMMUNITY)
Admission: EM | Admit: 2021-08-16 | Discharge: 2021-08-16 | Disposition: A | Discharge status: Home / Self Care | Payer: Medicare Other | Attending: Emergency Medicine | Admitting: Emergency Medicine

## 2021-08-16 ENCOUNTER — Encounter (HOSPITAL_COMMUNITY): Payer: Self-pay | Admitting: Emergency Medicine

## 2021-08-16 ENCOUNTER — Ambulatory Visit (INDEPENDENT_AMBULATORY_CARE_PROVIDER_SITE_OTHER): Payer: Medicare Other

## 2021-08-16 DIAGNOSIS — Z7901 Long term (current) use of anticoagulants: Secondary | ICD-10-CM | POA: Diagnosis not present

## 2021-08-16 DIAGNOSIS — Z5181 Encounter for therapeutic drug level monitoring: Secondary | ICD-10-CM

## 2021-08-16 DIAGNOSIS — I10 Essential (primary) hypertension: Secondary | ICD-10-CM | POA: Diagnosis not present

## 2021-08-16 DIAGNOSIS — R0902 Hypoxemia: Secondary | ICD-10-CM | POA: Diagnosis not present

## 2021-08-16 DIAGNOSIS — I480 Paroxysmal atrial fibrillation: Secondary | ICD-10-CM

## 2021-08-16 DIAGNOSIS — L7622 Postprocedural hemorrhage and hematoma of skin and subcutaneous tissue following other procedure: Secondary | ICD-10-CM | POA: Diagnosis not present

## 2021-08-16 DIAGNOSIS — R791 Abnormal coagulation profile: Secondary | ICD-10-CM | POA: Diagnosis not present

## 2021-08-16 LAB — COMPREHENSIVE METABOLIC PANEL
ALT: 15 U/L (ref 0–44)
AST: 22 U/L (ref 15–41)
Albumin: 3.6 g/dL (ref 3.5–5.0)
Alkaline Phosphatase: 65 U/L (ref 38–126)
Anion gap: 10 (ref 5–15)
BUN: 18 mg/dL (ref 8–23)
CO2: 24 mmol/L (ref 22–32)
Calcium: 9.1 mg/dL (ref 8.9–10.3)
Chloride: 110 mmol/L (ref 98–111)
Creatinine, Ser: 0.88 mg/dL (ref 0.44–1.00)
GFR, Estimated: >60 mL/min (ref >60)
Glucose, Bld: 84 mg/dL (ref 70–99)
Potassium: 3.7 mmol/L (ref 3.5–5.1)
Sodium: 144 mmol/L (ref 135–145)
Total Bilirubin: 0.5 mg/dL (ref 0.3–1.2)
Total Protein: 5.9 g/dL — ABNORMAL LOW (ref 6.5–8.1)

## 2021-08-16 LAB — CBC WITH DIFFERENTIAL/PLATELET
Abs Immature Granulocytes: 0.01 10*3/uL (ref 0.00–0.07)
Basophils Absolute: 0.1 10*3/uL (ref 0.0–0.1)
Basophils Relative: 2 %
Eosinophils Absolute: 0.1 10*3/uL (ref 0.0–0.5)
Eosinophils Relative: 3 %
HCT: 41.8 % (ref 36.0–46.0)
Hemoglobin: 13.7 g/dL (ref 12.0–15.0)
Immature Granulocytes: 0 %
Lymphocytes Relative: 28 %
Lymphs Abs: 1.5 10*3/uL (ref 0.7–4.0)
MCH: 29.9 pg (ref 26.0–34.0)
MCHC: 32.8 g/dL (ref 30.0–36.0)
MCV: 91.3 fL (ref 80.0–100.0)
Monocytes Absolute: 0.4 10*3/uL (ref 0.1–1.0)
Monocytes Relative: 7 %
Neutro Abs: 3.2 10*3/uL (ref 1.7–7.7)
Neutrophils Relative %: 60 %
Platelets: 163 10*3/uL (ref 150–400)
RBC: 4.58 MIL/uL (ref 3.87–5.11)
RDW: 14.7 % (ref 11.5–15.5)
WBC: 5.4 10*3/uL (ref 4.0–10.5)
nRBC: 0 % (ref 0.0–0.2)

## 2021-08-16 LAB — PROTIME-INR
INR: 3.7 — ABNORMAL HIGH (ref 0.8–1.2)
Prothrombin Time: 36.8 seconds — ABNORMAL HIGH (ref 11.4–15.2)

## 2021-08-16 NOTE — ED Triage Notes (Signed)
Pt bib EMS from home. Pt had a cancerous area removed from L cheek 2/7 and is on coumadin from previous stroke. Pt awoke 0330 to uncontrollable bleeding at site. Per EMS, area is still bleeding at this time under the gauze wrap, but it is controlled. L-arm restricted from breast cancer.   Hx HTN, EMS VSS

## 2021-08-16 NOTE — Patient Instructions (Signed)
Description   Pt's son called concerning dosage instructions since pt's INR was elevated per ED lab results. Advised to follow instructions that were provided by ED physician - Hold today's dose and only take 0.5 tablet tomorrow and then continue taking Warfarin 1.5 tablets daily except for 1 tablet on Mondays, Wednesdays, and Fridays. Also instructed to eat green tonight.   Recheck INR Monday 08/21/21.  Coumadin Clinic # [phone] Main # [phone] with any problems new medications or if scheduled for any procedures.  Fax [phone]

## 2021-08-16 NOTE — Discharge Instructions (Addendum)
Call the clinic today to make an appointment for Monday, skip Coumadin today, take half dose tomorrow, resume regular dose Friday.  Try to get the bandage removed on Saturday or Monday if possible in an office where they can fix it if necessary.

## 2021-08-16 NOTE — ED Notes (Signed)
Pt's wound site bleeding through gauze held to skin. Quick Clot applied to wound by ED provider.

## 2021-08-17 NOTE — ED Provider Notes (Addendum)
[facility] EMERGENCY DEPARTMENT Provider Note   CSN: [id number] Arrival date & time:        History  Chief Complaint  Patient presents with   Post-op Problem    Renee Adams is a 88 y.o. female.  88 year old female on Coumadin the presents emerged from today with bleeding from a excision site on her left cheek.  Patient states that she had some cancerous lesion removed by dermatologist yesterday and had been doing well till around 330 this morning and the wound started bleeding woke her up from her sleep.  Presents here for further evaluation.  No weakness, nausea, vomiting, shortness of breath or other light headedness       Home Medications Prior to Admission medications   Medication Sig Start Date End Date Taking? Authorizing Provider  acetaminophen (TYLENOL) 500 MG tablet Take 1 tablet (500 mg total) by mouth every 8 (eight) hours as needed. Patient taking differently: Take 500 mg by mouth every 8 (eight) hours as needed for moderate pain or headache. 02/07/21  Yes Magrinat, Virgie Dad, MD  ALPRAZolam Duanne Moron) 0.25 MG tablet Take 4 tablets (1 mg total) by mouth every 4 (four) hours as needed for anxiety. 12/17/12  Yes Angiulli, Lavon Paganini, PA-C  EPINEPHrine 0.3 mg/0.3 mL IJ SOAJ injection Inject 0.3 mg into the muscle as needed for anaphylaxis.   Yes [provider]  latanoprost (XALATAN) 0.005 % ophthalmic solution Place 1 drop into both eyes at bedtime. 12/17/12  Yes Angiulli, Lavon Paganini, PA-C  rOPINIRole (REQUIP) 1 MG tablet Take 1 tablet (1 mg total) by mouth at bedtime as needed. For restless legs Patient taking differently: Take 1 mg by mouth at bedtime as needed (restless legs). 12/17/12  Yes Angiulli, Lavon Paganini, PA-C  sertraline (ZOLOFT) 100 MG tablet Take 1.5 tablets (150 mg total) by mouth daily. Patient taking differently: Take 100 mg by mouth daily. 12/17/12  Yes Angiulli, Lavon Paganini, PA-C  traMADol (ULTRAM) 50 MG tablet Take 50 mg by mouth 2 (two)  times daily as needed for moderate pain. 07/12/21  Yes [provider]  warfarin (COUMADIN) 2 MG tablet TAKE AS DIRECTED BY ANTICOAGULATION CLINIC. Patient taking differently: Take 2 mg by mouth daily. 08/07/21  Yes Jettie Booze, MD  buPROPion (WELLBUTRIN XL) 300 MG 24 hr tablet Take 1 tablet (300 mg total) by mouth daily. Patient not taking: Reported on 03/02/2021 12/17/12   Angiulli, Lavon Paganini, PA-C  calcium carbonate 200 MG capsule Take 3 capsules (600 mg total) by mouth 2 (two) times daily with a meal. Patient not taking: Reported on 08/16/2021 12/17/12   Angiulli, Lavon Paganini, PA-C  furosemide (LASIX) 40 MG tablet Take 1 tablet (40 mg total) by mouth daily as needed. Patient not taking: Reported on 03/02/2021 03/05/17 02/02/20  Jettie Booze, MD  gabapentin (NEURONTIN) 100 MG capsule Take 3 capsules (300 mg total) by mouth every 8 (eight) hours as needed. Patient not taking: Reported on 03/02/2021 02/07/21   Magrinat, Virgie Dad, MD  ketoconazole (NIZORAL) 2 % cream APPLY TO AFFECTED AREA ON THE SKIN DAILY Patient not taking: Reported on 08/16/2021 12/21/20   Magrinat, Virgie Dad, MD  pramipexole (MIRAPEX) 0.125 MG tablet Take 0.125 mg by mouth 3 (three) times daily.  09/15/11  [provider]      Allergies    Dilaudid [hydromorphone hcl], Hydromorphone, Morphine, Morphine and related, Sulfamethoxazole, Sulfa antibiotics, Keflex [cephalexin], and Levaquin [levofloxacin]    Review of Systems   Review  of Systems  Physical Exam Updated Vital Signs BP (!) 154/79    Pulse 70    Temp 98.1 F (36.7 C) (Oral)    Resp 17    Ht 5\' 3"  (1.6 m)    Wt 63.5 kg    SpO2 95%    BMI 24.80 kg/m  Physical Exam Vitals and nursing note reviewed.  Constitutional:      Appearance: She is well-developed.  HENT:     Head: Normocephalic and atraumatic.     Comments: Approximately dime sized lesion under left cheek with a pinpoint area that is oozing blood that does not stop with pressure.    Nose:  No congestion or rhinorrhea.  Eyes:     Pupils: Pupils are equal, round, and reactive to light.  Cardiovascular:     Rate and Rhythm: Normal rate and regular rhythm.  Pulmonary:     Effort: No respiratory distress.     Breath sounds: No stridor.  Abdominal:     General: Abdomen is flat. There is no distension.  Musculoskeletal:     Cervical back: Normal range of motion.  Skin:    General: Skin is warm and dry.  Neurological:     General: No focal deficit present.     Mental Status: She is alert.    ED Results / Procedures / Treatments   Labs (all labs ordered are listed, but only abnormal results are displayed) Labs Reviewed  COMPREHENSIVE METABOLIC PANEL - Abnormal; Notable for the following components:      Result Value   Total Protein 5.9 (*)    All other components within normal limits  PROTIME-INR - Abnormal; Notable for the following components:   Prothrombin Time 36.8 (*)    INR 3.7 (*)    All other components within normal limits  CBC WITH DIFFERENTIAL/PLATELET    EKG None  Radiology No results found.  Procedures Procedures    Medications Ordered in ED Medications - No data to display  ED Course/ Medical Decision Making/ A&P                           Medical Decision Making Amount and/or Complexity of Data Reviewed Labs: ordered.   Ultimately ended up using an quick clot and a dressing over top of it to stop the bleeding.  Observed for about 2 hours afterwards with no bleeding.  Able to talk and swallow without bleeding.  1 extra dressing sent home with the son to redress if it started to ooze again.  Otherwise will return here or follow-up with their primary doctor for dressing removal in a few days. No e/o hemorrhagic shock.   Discussed Coumadin dosing with the pharmacist and will skip a dose and then take a half dose and then resume normal dosing with INR clinic follow-up  Final Clinical Impression(s) / ED Diagnoses Final diagnoses:  None     Rx / DC Orders ED Discharge Orders     None         Hazem Kenner, Corene Cornea, MD 08/17/21 0121    Merrily Pew, MD 08/17/21 0122

## 2021-08-21 ENCOUNTER — Ambulatory Visit (INDEPENDENT_AMBULATORY_CARE_PROVIDER_SITE_OTHER): Payer: Medicare Other

## 2021-08-21 ENCOUNTER — Other Ambulatory Visit: Payer: Self-pay

## 2021-08-21 DIAGNOSIS — Z5181 Encounter for therapeutic drug level monitoring: Secondary | ICD-10-CM | POA: Diagnosis not present

## 2021-08-21 DIAGNOSIS — I480 Paroxysmal atrial fibrillation: Secondary | ICD-10-CM

## 2021-08-21 LAB — POCT INR: INR: 1.4 — AB (ref 2.0–3.0)

## 2021-08-21 NOTE — Patient Instructions (Signed)
Description   Take 1.5 tablets today and then continue taking Warfarin 1.5 tablets daily except for 1 tablet on Mondays, Wednesdays, and Fridays. Recheck INR in 1 week.   Coumadin Clinic # [phone] Main # [phone] with any problems new medications or if scheduled for any procedures.  Fax [phone]

## 2021-08-30 ENCOUNTER — Other Ambulatory Visit: Payer: Self-pay

## 2021-08-30 ENCOUNTER — Ambulatory Visit (INDEPENDENT_AMBULATORY_CARE_PROVIDER_SITE_OTHER): Payer: Medicare Other | Admitting: *Deleted

## 2021-08-30 DIAGNOSIS — L928 Other granulomatous disorders of the skin and subcutaneous tissue: Secondary | ICD-10-CM | POA: Diagnosis not present

## 2021-08-30 DIAGNOSIS — M4180 Other forms of scoliosis, site unspecified: Secondary | ICD-10-CM | POA: Diagnosis not present

## 2021-08-30 DIAGNOSIS — Z5181 Encounter for therapeutic drug level monitoring: Secondary | ICD-10-CM | POA: Diagnosis not present

## 2021-08-30 DIAGNOSIS — I480 Paroxysmal atrial fibrillation: Secondary | ICD-10-CM

## 2021-08-30 DIAGNOSIS — M545 Low back pain, unspecified: Secondary | ICD-10-CM | POA: Diagnosis not present

## 2021-08-30 LAB — POCT INR: INR: 2.1 (ref 2.0–3.0)

## 2021-08-30 NOTE — Patient Instructions (Signed)
Description   Continue taking Warfarin 1.5 tablets daily except for 1 tablet on Mondays, Wednesdays, and Fridays. Recheck INR in 2 week.   Coumadin Clinic # [phone] Main # [phone] with any problems new medications or if scheduled for any procedures.  Fax [phone]

## 2021-09-03 DIAGNOSIS — M4150 Other secondary scoliosis, site unspecified: Secondary | ICD-10-CM | POA: Insufficient documentation

## 2021-09-05 DIAGNOSIS — Z85828 Personal history of other malignant neoplasm of skin: Secondary | ICD-10-CM | POA: Diagnosis not present

## 2021-09-05 DIAGNOSIS — Z08 Encounter for follow-up examination after completed treatment for malignant neoplasm: Secondary | ICD-10-CM | POA: Diagnosis not present

## 2021-09-11 ENCOUNTER — Telehealth: Payer: Self-pay | Admitting: Hematology and Oncology

## 2021-09-11 NOTE — Telephone Encounter (Signed)
Rescheduled appointments per provider's template. Left message.  ?

## 2021-09-20 ENCOUNTER — Ambulatory Visit (INDEPENDENT_AMBULATORY_CARE_PROVIDER_SITE_OTHER): Payer: Medicare Other | Admitting: *Deleted

## 2021-09-20 ENCOUNTER — Other Ambulatory Visit: Payer: Self-pay

## 2021-09-20 DIAGNOSIS — I6789 Other cerebrovascular disease: Secondary | ICD-10-CM

## 2021-09-20 DIAGNOSIS — Z5181 Encounter for therapeutic drug level monitoring: Secondary | ICD-10-CM | POA: Diagnosis not present

## 2021-09-20 DIAGNOSIS — I480 Paroxysmal atrial fibrillation: Secondary | ICD-10-CM

## 2021-09-20 LAB — POCT INR: INR: 3.2 — AB (ref 2.0–3.0)

## 2021-09-20 NOTE — Patient Instructions (Signed)
Description   ?Today take 1/2 tablet then continue taking Warfarin 1.5 tablets daily except for 1 tablet on Mondays, Wednesdays, and Fridays. Recheck INR in 3 weeks. Coumadin Clinic # [phone] Main # [phone] with any problems new medications or if scheduled for any procedures.  Fax [phone] ?  ?  ?

## 2021-10-18 ENCOUNTER — Ambulatory Visit (INDEPENDENT_AMBULATORY_CARE_PROVIDER_SITE_OTHER): Payer: Medicare Other | Admitting: *Deleted

## 2021-10-18 DIAGNOSIS — I480 Paroxysmal atrial fibrillation: Secondary | ICD-10-CM | POA: Diagnosis not present

## 2021-10-18 DIAGNOSIS — Z5181 Encounter for therapeutic drug level monitoring: Secondary | ICD-10-CM | POA: Diagnosis not present

## 2021-10-18 LAB — POCT INR: INR: 1.5 — AB (ref 2.0–3.0)

## 2021-10-18 NOTE — Patient Instructions (Addendum)
Description   ?Take 1.5 tablets of warfarin today ?Tomorrow take 2 tablets of warfarin ?Then continue to take warfarin 1.5 tablets daily except for 1 tablet on Monday, Wednesday and Friday.  ?Recheck INR in 1.5 weeks ?  ?  ?

## 2021-10-25 ENCOUNTER — Other Ambulatory Visit: Payer: Self-pay | Admitting: Hematology and Oncology

## 2021-10-25 ENCOUNTER — Other Ambulatory Visit: Payer: Self-pay | Admitting: Family Medicine

## 2021-10-25 DIAGNOSIS — Z1231 Encounter for screening mammogram for malignant neoplasm of breast: Secondary | ICD-10-CM

## 2021-10-31 ENCOUNTER — Ambulatory Visit
Admission: RE | Admit: 2021-10-31 | Discharge: 2021-10-31 | Disposition: A | Discharge status: Home / Self Care | Payer: Medicare Other | Source: Ambulatory Visit | Attending: Hematology and Oncology | Admitting: Hematology and Oncology

## 2021-10-31 DIAGNOSIS — Z1231 Encounter for screening mammogram for malignant neoplasm of breast: Secondary | ICD-10-CM | POA: Diagnosis not present

## 2021-11-01 ENCOUNTER — Ambulatory Visit: Payer: Medicare Other | Admitting: Hematology and Oncology

## 2021-11-01 ENCOUNTER — Other Ambulatory Visit: Payer: Medicare Other

## 2021-11-02 ENCOUNTER — Inpatient Hospital Stay (HOSPITAL_BASED_OUTPATIENT_CLINIC_OR_DEPARTMENT_OTHER): Payer: Medicare Other | Admitting: Hematology and Oncology

## 2021-11-02 ENCOUNTER — Inpatient Hospital Stay: Payer: Medicare Other | Attending: Hematology and Oncology

## 2021-11-02 ENCOUNTER — Encounter: Payer: Self-pay | Admitting: Hematology and Oncology

## 2021-11-02 ENCOUNTER — Other Ambulatory Visit: Payer: Self-pay | Admitting: *Deleted

## 2021-11-02 ENCOUNTER — Other Ambulatory Visit: Payer: Self-pay

## 2021-11-02 VITALS — BP 148/76 | HR 100 | Temp 97.7°F | Resp 16 | Ht 63.0 in | Wt 148.9 lb

## 2021-11-02 DIAGNOSIS — Z882 Allergy status to sulfonamides status: Secondary | ICD-10-CM | POA: Insufficient documentation

## 2021-11-02 DIAGNOSIS — Z8249 Family history of ischemic heart disease and other diseases of the circulatory system: Secondary | ICD-10-CM | POA: Insufficient documentation

## 2021-11-02 DIAGNOSIS — Z833 Family history of diabetes mellitus: Secondary | ICD-10-CM | POA: Diagnosis not present

## 2021-11-02 DIAGNOSIS — Z170 Estrogen receptor positive status [ER+]: Secondary | ICD-10-CM

## 2021-11-02 DIAGNOSIS — C50812 Malignant neoplasm of overlapping sites of left female breast: Secondary | ICD-10-CM

## 2021-11-02 DIAGNOSIS — Z881 Allergy status to other antibiotic agents status: Secondary | ICD-10-CM | POA: Insufficient documentation

## 2021-11-02 DIAGNOSIS — Z803 Family history of malignant neoplasm of breast: Secondary | ICD-10-CM | POA: Diagnosis not present

## 2021-11-02 DIAGNOSIS — Z885 Allergy status to narcotic agent status: Secondary | ICD-10-CM | POA: Insufficient documentation

## 2021-11-02 DIAGNOSIS — Z79899 Other long term (current) drug therapy: Secondary | ICD-10-CM | POA: Diagnosis not present

## 2021-11-02 DIAGNOSIS — Z8673 Personal history of transient ischemic attack (TIA), and cerebral infarction without residual deficits: Secondary | ICD-10-CM | POA: Diagnosis not present

## 2021-11-02 DIAGNOSIS — Z853 Personal history of malignant neoplasm of breast: Secondary | ICD-10-CM | POA: Insufficient documentation

## 2021-11-02 DIAGNOSIS — Z923 Personal history of irradiation: Secondary | ICD-10-CM | POA: Diagnosis not present

## 2021-11-02 DIAGNOSIS — Z8261 Family history of arthritis: Secondary | ICD-10-CM | POA: Diagnosis not present

## 2021-11-02 DIAGNOSIS — Z888 Allergy status to other drugs, medicaments and biological substances status: Secondary | ICD-10-CM | POA: Diagnosis not present

## 2021-11-02 DIAGNOSIS — I10 Essential (primary) hypertension: Secondary | ICD-10-CM | POA: Diagnosis not present

## 2021-11-02 DIAGNOSIS — Z7901 Long term (current) use of anticoagulants: Secondary | ICD-10-CM | POA: Diagnosis not present

## 2021-11-02 DIAGNOSIS — Z809 Family history of malignant neoplasm, unspecified: Secondary | ICD-10-CM | POA: Diagnosis not present

## 2021-11-02 LAB — CMP (CANCER CENTER ONLY)
ALT: 14 U/L (ref 0–44)
AST: 22 U/L (ref 15–41)
Albumin: 4.1 g/dL (ref 3.5–5.0)
Alkaline Phosphatase: 85 U/L (ref 38–126)
Anion gap: 4 — ABNORMAL LOW (ref 5–15)
BUN: 23 mg/dL (ref 8–23)
CO2: 28 mmol/L (ref 22–32)
Calcium: 9.4 mg/dL (ref 8.9–10.3)
Chloride: 107 mmol/L (ref 98–111)
Creatinine: 0.78 mg/dL (ref 0.44–1.00)
GFR, Estimated: >60 mL/min (ref >60)
Glucose, Bld: 91 mg/dL (ref 70–99)
Potassium: 4.4 mmol/L (ref 3.5–5.1)
Sodium: 139 mmol/L (ref 135–145)
Total Bilirubin: 0.4 mg/dL (ref 0.3–1.2)
Total Protein: 6.8 g/dL (ref 6.5–8.1)

## 2021-11-02 LAB — CBC WITH DIFFERENTIAL (CANCER CENTER ONLY)
Abs Immature Granulocytes: 0.05 10*3/uL (ref 0.00–0.07)
Basophils Absolute: 0.2 10*3/uL — ABNORMAL HIGH (ref 0.0–0.1)
Basophils Relative: 2 %
Eosinophils Absolute: 0.3 10*3/uL (ref 0.0–0.5)
Eosinophils Relative: 4 %
HCT: 42.1 % (ref 36.0–46.0)
Hemoglobin: 13.7 g/dL (ref 12.0–15.0)
Immature Granulocytes: 1 %
Lymphocytes Relative: 22 %
Lymphs Abs: 1.8 10*3/uL (ref 0.7–4.0)
MCH: 29.7 pg (ref 26.0–34.0)
MCHC: 32.5 g/dL (ref 30.0–36.0)
MCV: 91.3 fL (ref 80.0–100.0)
Monocytes Absolute: 0.5 10*3/uL (ref 0.1–1.0)
Monocytes Relative: 6 %
Neutro Abs: 5.5 10*3/uL (ref 1.7–7.7)
Neutrophils Relative %: 65 %
Platelet Count: 220 10*3/uL (ref 150–400)
RBC: 4.61 MIL/uL (ref 3.87–5.11)
RDW: 14.3 % (ref 11.5–15.5)
WBC Count: 8.4 10*3/uL (ref 4.0–10.5)
nRBC: 0 % (ref 0.0–0.2)

## 2021-11-02 MED ORDER — KETOCONAZOLE 2 % EX CREA: TOPICAL_CREAM | Freq: Every day | CUTANEOUS | 0 refills | Status: DC | PRN

## 2021-11-02 NOTE — Progress Notes (Signed)
?[facility]  ?[phone] [phone]  ? ? ? ?ID: Renee Adams DOB: [birth year]  MR#: [id number]  CSN#: [id number] ? ?Patient Care Team: ?Alroy Dust, L.[name], MD as PCP - General (Family Medicine) ?Jettie Booze, MD as PCP - Cardiology (Cardiology) ?Bo Merino, MD as Consulting Physician (Rheumatology) ?Magrinat, Virgie Dad, MD (Inactive) as Consulting Physician (Oncology) ?OTHER MD: ? ?CHIEF COMPLAINT: Remote Hx of Left Breast Cancer ? ?CURRENT TREATMENT: observation ? ? ?INTERIM HISTORY: ?Renee Adams returns today for follow-up of her remote history of left breast cancer. She continues on observation alone. ?  ?Renee Adams is here for follow-up by herself.  She continues on observation and likes to come back to see Dr. Jana Hakim once a year.  She denies any new health complaints.  She most recently had a mammogram.  No changes in breathing, bowel habits or urinary habits.  Rest of the pertinent 10 point ROS reviewed and negative ? ? COVID 19 VACCINATION STATUS: [place] x2, most recently 09/2019 ? ? ?BREAST CANCER HISTORY: ? ?Kendria was originally diagnosed with a left breast carcinoma in early 1996. Tumor was T1, N0, ER and PR positive. She underwent left lumpectomy and sentinel lymph node dissection in March of 1996. ? ?Patient was treated with tamoxifen for 5 years, then Evista for 3 years. The Evsita was then discontinued secondary to history of stroke.  ? ?Currently she is being followed with observation alone. ? ? ?PAST MEDICAL HISTORY ?Past Medical History:  ?Diagnosis Date  ? ASD (atrial septal defect)   ? Cancer [facility])   ? breast, left  ? Carpal tunnel syndrome, bilateral   ? Depression   ? Gait disorder   ? GERD (gastroesophageal reflux disease)   ? Glaucoma   ? Glaucoma   ? History of stroke   ? Hypertension   ? IBS (irritable bowel syndrome)   ? Personal history of radiation therapy 1996  ? S/P bunionectomy   ? bilateral  ? ? ?PAST SURGICAL HISTORY ?Past Surgical  History:  ?Procedure Laterality Date  ? ABDOMINAL HYSTERECTOMY    ? bladder resuspension    ? BREAST BIOPSY Left   ? BREAST BIOPSY Right   ? BREAST LUMPECTOMY Left 1996  ? CATARACT EXTRACTION Bilateral   ? INCISION AND DRAINAGE HIP  03/31/2012  ? Procedure: IRRIGATION AND DEBRIDEMENT HIP WITH POLY EXCHANGE;  Surgeon: Rudean Haskell, MD;  Location: [facility];  Service: Orthopedics;  Laterality: Left;  ? ORIF FEMUR FRACTURE Right 11/30/2012  ? Procedure: OPEN REDUCTION INTERNAL FIXATION (ORIF) DISTAL FEMUR FRACTURE;  Surgeon: Vickey Huger, MD;  Location: [facility];  Service: Orthopedics;  Laterality: Right;  ? TOTAL HIP ARTHROPLASTY Bilateral   ? TOTAL KNEE ARTHROPLASTY Bilateral   ? ? ?FAMILY HISTORY ?Family History  ?Problem Relation Age of Onset  ? Cancer Mother   ? Breast cancer Mother   ? Heart failure Father   ? Diabetes Sister   ? Osteoarthritis Brother   ? Breast cancer Maternal Aunt   ? Heart attack Neg Hx   ? ? 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 She lives alone in her own home with  no pets. The patient's daughter [name] is a Materials engineer and lives in [place]. The patient's son [name] is an Designer, multimedia in [place]. The patient has 4 grandchildren.  ? ? ADVANCED DIRECTIVES: She has named both her children as healthcare powers of attorney ? ? ?HEALTHCARE MAINTENANCE  ?Current Outpatient  Medications  ?Medication Sig Dispense Refill  ? acetaminophen (TYLENOL) 500 MG tablet Take 1 tablet (500 mg total) by mouth every 8 (eight) hours as needed. (Patient taking differently: Take 500 mg by mouth every 8 (eight) hours as needed for moderate pain or headache.) 90 tablet 12  ? ALPRAZolam (XANAX) 0.25 MG tablet Take 4 tablets (1 mg total) by mouth every 4 (four) hours as needed for anxiety. 40 tablet 0  ? buPROPion (WELLBUTRIN XL) 300 MG 24 hr tablet Take 1 tablet (300 mg total) by mouth daily. (Patient not taking: Reported on 03/02/2021) 30 tablet 1  ? calcium  carbonate 200 MG capsule Take 3 capsules (600 mg total) by mouth 2 (two) times daily with a meal. (Patient not taking: Reported on 08/16/2021) 60 capsule 1  ? EPINEPHrine 0.3 mg/0.3 mL IJ SOAJ injection Inject 0.3 mg into the muscle as needed for anaphylaxis.    ? furosemide (LASIX) 40 MG tablet Take 1 tablet (40 mg total) by mouth daily as needed. (Patient not taking: Reported on 03/02/2021) 90 tablet 3  ? gabapentin (NEURONTIN) 100 MG capsule Take 3 capsules (300 mg total) by mouth every 8 (eight) hours as needed. (Patient not taking: Reported on 03/02/2021) 90 capsule 3  ? ketoconazole (NIZORAL) 2 % cream APPLY TO AFFECTED AREA ON THE SKIN DAILY (Patient not taking: Reported on 08/16/2021) 15 g 0  ? latanoprost (XALATAN) 0.005 % ophthalmic solution Place 1 drop into both eyes at bedtime. 2.5 mL 12  ? rOPINIRole (REQUIP) 1 MG tablet Take 1 tablet (1 mg total) by mouth at bedtime as needed. For restless legs (Patient taking differently: Take 1 mg by mouth at bedtime as needed (restless legs).) 30 tablet 0  ? sertraline (ZOLOFT) 100 MG tablet Take 1.5 tablets (150 mg total) by mouth daily. (Patient taking differently: Take 100 mg by mouth daily.) 30 tablet 1  ? traMADol (ULTRAM) 50 MG tablet Take 50 mg by mouth 2 (two) times daily as needed for moderate pain.    ? warfarin (COUMADIN) 2 MG tablet TAKE AS DIRECTED BY ANTICOAGULATION CLINIC. (Patient taking differently: Take 2 mg by mouth daily.) 45 tablet 1  ? ?No current facility-administered medications for this visit.  ? ? ?Allergies:  ?Allergies  ?Allergen Reactions  ? Dilaudid [Hydromorphone Hcl] Shortness Of Breath  ?  Tolerates tramadol  ? Hydromorphone Other (See Comments)  ?  UNKNOWN TO PATIENT  ? Morphine Other (See Comments)  ?  UNKNOWN TO PATIENT  ? Morphine And Related Shortness Of Breath  ?  Tolerates tramadol  ? Sulfamethoxazole Other (See Comments)  ?  UNKNOWN TO PATIENT  ? Sulfa Antibiotics Other (See Comments)  ?  "Made me drunk";  wobbly  ? Keflex  [Cephalexin] Nausea Only  ? Levaquin [Levofloxacin] Hives and Rash  ? ? ?Physical Exam: white woman using a Rollator ? ?Vitals:  ? 11/02/21 1303  ?BP: (!) 148/76  ?Pulse: 100  ?Resp: 16  ?Temp: 97.7 ?F (36.5 ?C)  ?SpO2: 95%  ? ? ?Wt Readings from Last 3 Encounters:  ?11/02/21 148 lb 14.4 oz (67.5 kg)  ?08/16/21 140 lb (63.5 kg)  ?03/02/21 145 lb (65.8 kg)  ? ?Body mass index is 26.38 kg/m?.   ? ?ECOG FS:2 - Symptomatic, <50% confined to bed ? ?Physical Exam ?Constitutional:   ?   Appearance: Normal appearance.  ?Cardiovascular:  ?   Heart sounds: No murmur heard. ?Chest:  ?   Comments: Bilateral breasts inspected.  Left breast smaller than  the right breast.  Surgical scar noted.  No concerning masses or regional recurrence.  No palpable adenopathy. ?Musculoskeletal:  ?   Cervical back: Normal range of motion and neck supple. No rigidity.  ?Lymphadenopathy:  ?   Cervical: No cervical adenopathy.  ?Skin: ?   General: Skin is warm and dry.  ?Neurological:  ?   General: No focal deficit present.  ?   Mental Status: She is alert.  ? ? ? ? ? ?Lab Results: ?Lab Results  ?Component Value Date  ? WBC 5.4 08/16/2021  ? HGB 13.7 08/16/2021  ? HCT 41.8 08/16/2021  ? MCV 91.3 08/16/2021  ? PLT 163 08/16/2021  ? NEUTROABS 3.2 08/16/2021  ? ?  Chemistry   ?   ?Component Value Date/Time  ? NA 144 08/16/2021 0600  ? NA 142 10/11/2016 1242  ? K 3.7 08/16/2021 0600  ? K 4.4 10/11/2016 1242  ? CL 110 08/16/2021 0600  ? CL 109 (H) 09/10/2012 1315  ? CO2 24 08/16/2021 0600  ? CO2 24 10/11/2016 1242  ? BUN 18 08/16/2021 0600  ? BUN 15.6 10/11/2016 1242  ? CREATININE 0.88 08/16/2021 0600  ? CREATININE 1.11 (H) 01/01/2019 1500  ? CREATININE 0.9 10/11/2016 1242  ?    ?Component Value Date/Time  ? CALCIUM 9.1 08/16/2021 0600  ? CALCIUM 9.8 10/11/2016 1242  ? ALKPHOS 65 08/16/2021 0600  ? ALKPHOS 103 10/11/2016 1242  ? AST 22 08/16/2021 0600  ? AST 20 01/01/2019 1500  ? AST 20 10/11/2016 1242  ? ALT 15 08/16/2021 0600  ? ALT 12 01/01/2019 1500   ? ALT 10 10/11/2016 1242  ? BILITOT 0.5 08/16/2021 0600  ? BILITOT 0.7 01/01/2019 1500  ? BILITOT 0.78 10/11/2016 1242  ?  ? ? ?STUDIES: ?MM 3D SCREEN BREAST BILATERAL ? ?Result Date: 11/01/2021 ?CLINICAL DATA:  Screenin

## 2021-11-07 ENCOUNTER — Other Ambulatory Visit: Payer: Self-pay | Admitting: Interventional Cardiology

## 2021-11-07 DIAGNOSIS — M159 Polyosteoarthritis, unspecified: Secondary | ICD-10-CM | POA: Diagnosis not present

## 2021-11-07 DIAGNOSIS — F411 Generalized anxiety disorder: Secondary | ICD-10-CM | POA: Diagnosis not present

## 2021-11-07 DIAGNOSIS — G2581 Restless legs syndrome: Secondary | ICD-10-CM | POA: Diagnosis not present

## 2021-11-07 DIAGNOSIS — F321 Major depressive disorder, single episode, moderate: Secondary | ICD-10-CM | POA: Diagnosis not present

## 2021-11-07 DIAGNOSIS — B354 Tinea corporis: Secondary | ICD-10-CM | POA: Diagnosis not present

## 2021-11-07 DIAGNOSIS — I480 Paroxysmal atrial fibrillation: Secondary | ICD-10-CM

## 2021-11-07 DIAGNOSIS — Z23 Encounter for immunization: Secondary | ICD-10-CM | POA: Diagnosis not present

## 2021-11-07 DIAGNOSIS — Z5181 Encounter for therapeutic drug level monitoring: Secondary | ICD-10-CM

## 2021-11-08 NOTE — Telephone Encounter (Signed)
Prescription refill request received for warfarin ?Lov: 03/02/2021, [place] ?Next INR check: 4/24 ?Warfarin tablet strength: '2mg'$   ? ?Pt is overdue for INR check.  ?

## 2021-11-08 NOTE — Telephone Encounter (Signed)
Attempted to call pt,  no answer. Lmom.  ?

## 2021-11-08 NOTE — Telephone Encounter (Signed)
Called and spoke to pt. Will send in refill so she does not run out. Pt stated that she would not be able to come until next week. Scheduled pt an appointment on 5/9.  ?

## 2021-11-08 NOTE — Telephone Encounter (Signed)
Called pt and LMOM.  

## 2021-11-16 ENCOUNTER — Ambulatory Visit (INDEPENDENT_AMBULATORY_CARE_PROVIDER_SITE_OTHER): Payer: Medicare Other | Admitting: *Deleted

## 2021-11-16 DIAGNOSIS — Z5181 Encounter for therapeutic drug level monitoring: Secondary | ICD-10-CM

## 2021-11-16 DIAGNOSIS — I480 Paroxysmal atrial fibrillation: Secondary | ICD-10-CM

## 2021-11-16 DIAGNOSIS — I6789 Other cerebrovascular disease: Secondary | ICD-10-CM

## 2021-11-16 LAB — POCT INR: INR: 3.9 — AB (ref 2.0–3.0)

## 2021-11-16 NOTE — Patient Instructions (Signed)
Description   ?Do not take any warfarin today then continue taking warfarin 1.5 tablets daily except for 1 tablet on Monday, Wednesday and Friday. Recheck INR in 2 weeks.  ?Procedure Fax [phone]  & Main [phone] Anticoagulation Clinic [phone] ?  ?  ?

## 2021-12-19 ENCOUNTER — Other Ambulatory Visit: Payer: Self-pay | Admitting: Interventional Cardiology

## 2021-12-19 DIAGNOSIS — I480 Paroxysmal atrial fibrillation: Secondary | ICD-10-CM

## 2021-12-19 DIAGNOSIS — Z5181 Encounter for therapeutic drug level monitoring: Secondary | ICD-10-CM

## 2021-12-20 NOTE — Telephone Encounter (Addendum)
Pt is overdue for Anticoagulation Appt, she was last seen on 11/16/2021 and was due on 11/29/2021. Will call pt at appropriate time to make an appt to have INR checked.   Called pt there was no answer left a voicemail.

## 2021-12-21 ENCOUNTER — Ambulatory Visit: Payer: Medicare Other | Admitting: Podiatry

## 2021-12-26 ENCOUNTER — Ambulatory Visit (INDEPENDENT_AMBULATORY_CARE_PROVIDER_SITE_OTHER): Payer: Medicare Other

## 2021-12-26 DIAGNOSIS — I480 Paroxysmal atrial fibrillation: Secondary | ICD-10-CM

## 2021-12-26 DIAGNOSIS — Z5181 Encounter for therapeutic drug level monitoring: Secondary | ICD-10-CM

## 2021-12-26 DIAGNOSIS — I6789 Other cerebrovascular disease: Secondary | ICD-10-CM

## 2021-12-26 LAB — POCT INR: INR: 1.4 — AB (ref 2.0–3.0)

## 2021-12-26 NOTE — Patient Instructions (Signed)
TAKE 2 TABLETS TODAY ONLY and then continue 1.5 tablets daily except for 1 tablet on Monday, Wednesday and Friday. Recheck INR in 2 weeks.  Procedure Fax [phone]  & Main [phone] Anticoagulation Clinic [phone]

## 2022-01-04 DIAGNOSIS — M25552 Pain in left hip: Secondary | ICD-10-CM | POA: Diagnosis not present

## 2022-01-04 DIAGNOSIS — M545 Low back pain, unspecified: Secondary | ICD-10-CM | POA: Diagnosis not present

## 2022-01-04 DIAGNOSIS — Z96642 Presence of left artificial hip joint: Secondary | ICD-10-CM | POA: Diagnosis not present

## 2022-01-08 ENCOUNTER — Other Ambulatory Visit: Payer: Self-pay | Admitting: Pharmacist

## 2022-01-08 DIAGNOSIS — I480 Paroxysmal atrial fibrillation: Secondary | ICD-10-CM

## 2022-01-08 DIAGNOSIS — Z5181 Encounter for therapeutic drug level monitoring: Secondary | ICD-10-CM

## 2022-01-08 MED ORDER — WARFARIN SODIUM 2 MG PO TABS: ORAL_TABLET | ORAL | 0 refills | Status: DC

## 2022-01-11 ENCOUNTER — Encounter: Payer: Self-pay | Admitting: Podiatry

## 2022-01-11 ENCOUNTER — Ambulatory Visit (INDEPENDENT_AMBULATORY_CARE_PROVIDER_SITE_OTHER): Payer: Medicare Other | Admitting: Podiatry

## 2022-01-11 DIAGNOSIS — D2371 Other benign neoplasm of skin of right lower limb, including hip: Secondary | ICD-10-CM | POA: Diagnosis not present

## 2022-01-11 NOTE — Progress Notes (Signed)
Subjective:  Patient ID: Renee Adams, female    DOB: 08-03-1933,  MRN: [id number] HPI Chief Complaint  Patient presents with   Foot Pain    Plantar forefoot right - callused area x 6 months, tender walking, no treatment   New Patient (Initial Visit)    88 y.o. female presents with the above complaint.   ROS: Denies fever chills nausea vomiting muscle aches pains calf pain back pain chest pain shortness of breath.  Past Medical History:  Diagnosis Date   ASD (atrial septal defect)    Cancer (HCC)    breast, left   Carpal tunnel syndrome, bilateral    Depression    Gait disorder    GERD (gastroesophageal reflux disease)    Glaucoma    Glaucoma    History of stroke    Hypertension    IBS (irritable bowel syndrome)    Personal history of radiation therapy 1996   S/P bunionectomy    bilateral   Past Surgical History:  Procedure Laterality Date   ABDOMINAL HYSTERECTOMY     bladder resuspension     BREAST BIOPSY Left    BREAST BIOPSY Right    BREAST LUMPECTOMY Left 1996   CATARACT EXTRACTION Bilateral    INCISION AND DRAINAGE HIP  03/31/2012   Procedure: IRRIGATION AND DEBRIDEMENT HIP WITH POLY EXCHANGE;  Surgeon: Rudean Haskell, MD;  Location: [facility];  Service: Orthopedics;  Laterality: Left;   ORIF FEMUR FRACTURE Right 11/30/2012   Procedure: OPEN REDUCTION INTERNAL FIXATION (ORIF) DISTAL FEMUR FRACTURE;  Surgeon: Vickey Huger, MD;  Location: [facility];  Service: Orthopedics;  Laterality: Right;   TOTAL HIP ARTHROPLASTY Bilateral    TOTAL KNEE ARTHROPLASTY Bilateral     Current Outpatient Medications:    acetaminophen (TYLENOL) 500 MG tablet, Take 1 tablet (500 mg total) by mouth every 8 (eight) hours as needed. (Patient taking differently: Take 500 mg by mouth every 8 (eight) hours as needed for moderate pain or headache.), Disp: 90 tablet, Rfl: 12   ALPRAZolam (XANAX) 0.25 MG tablet, Take 4 tablets (1 mg total) by mouth every 4 (four) hours as needed for anxiety., Disp:  40 tablet, Rfl: 0   calcium carbonate 200 MG capsule, Take 3 capsules (600 mg total) by mouth 2 (two) times daily with a meal. (Patient not taking: Reported on 08/16/2021), Disp: 60 capsule, Rfl: 1   EPINEPHrine 0.3 mg/0.3 mL IJ SOAJ injection, Inject 0.3 mg into the muscle as needed for anaphylaxis., Disp: , Rfl:    furosemide (LASIX) 40 MG tablet, Take 1 tablet (40 mg total) by mouth daily as needed. (Patient not taking: Reported on 03/02/2021), Disp: 90 tablet, Rfl: 3   gabapentin (NEURONTIN) 100 MG capsule, Take 3 capsules (300 mg total) by mouth every 8 (eight) hours as needed. (Patient not taking: Reported on 03/02/2021), Disp: 90 capsule, Rfl: 3   ketoconazole (NIZORAL) 2 % cream, Apply topically daily as needed for irritation., Disp: 15 g, Rfl: 0   latanoprost (XALATAN) 0.005 % ophthalmic solution, Place 1 drop into both eyes at bedtime., Disp: 2.5 mL, Rfl: 12   rOPINIRole (REQUIP) 1 MG tablet, Take 1 tablet (1 mg total) by mouth at bedtime as needed. For restless legs (Patient taking differently: Take 1 mg by mouth at bedtime as needed (restless legs).), Disp: 30 tablet, Rfl: 0   sertraline (ZOLOFT) 100 MG tablet, Take 1.5 tablets (150 mg total) by mouth daily. (Patient taking differently: Take 100 mg by mouth daily.), Disp: 30 tablet,  Rfl: 1   traMADol (ULTRAM) 50 MG tablet, Take 50 mg by mouth 2 (two) times daily as needed for moderate pain., Disp: , Rfl:    warfarin (COUMADIN) 2 MG tablet, TAKE 1 TO 1 AND 1/2 TABLETS BY MOUTH DAILY AS DIRECTED BY THE COUMADIN CLINIC.  MUST KEEP APPT FOR FUTURE REFILLS., Disp: 60 tablet, Rfl: 0  Allergies  Allergen Reactions   Dilaudid [Hydromorphone Hcl] Shortness Of Breath    Tolerates tramadol   Hydromorphone Other (See Comments)    UNKNOWN TO PATIENT   Morphine Other (See Comments)    UNKNOWN TO PATIENT   Morphine And Related Shortness Of Breath    Tolerates tramadol   Sulfamethoxazole Other (See Comments)    UNKNOWN TO PATIENT   Sulfa Antibiotics  Other (See Comments)    "Made me drunk";  wobbly   Keflex [Cephalexin] Nausea Only   Levaquin [Levofloxacin] Hives and Rash   Review of Systems Objective:  There were no vitals filed for this visit.  General: Well developed, nourished, in no acute distress, alert and oriented x3   Dermatological: Skin is warm, dry and supple bilateral. Nails x 10 are well maintained; remaining integument appears unremarkable at this time. There are no open sores, no preulcerative lesions, no rash or signs of infection present.  Vascular: Dorsalis Pedis artery and Posterior Tibial artery pedal pulses are 2/4 bilateral with immedate capillary fill time. Pedal hair growth present. No varicosities and no lower extremity edema present bilateral.   Neruologic: Grossly intact via light touch bilateral. Vibratory intact via tuning fork bilateral. Protective threshold with Semmes Wienstein monofilament intact to all pedal sites bilateral. Patellar and Achilles deep tendon reflexes 2+ bilateral. No Babinski or clonus noted bilateral.   Musculoskeletal: No gross boney pedal deformities bilateral. No pain, crepitus, or limitation noted with foot and ankle range of motion bilateral. Muscular strength 5/5 in all groups tested bilateral.  Severe rigid hammertoe deformity of her right foot is resulting in plantarflexed second metatarsal with severe fat atrophy.  This is resulted in a reactive hyperkeratotic lesion.  Gait: Unassisted, Nonantalgic.    Radiographs:  None taken  Assessment & Plan:   Assessment: Rigid hammertoe deformity and wound plantar aspect subsecond metatarsal right.  Plan: Debridement of reactive hyperkeratotic lesion no iatrogenic lesions noted placed padding.     Bani Gianfrancesco T. [place], [place]

## 2022-01-12 ENCOUNTER — Telehealth: Payer: Self-pay | Admitting: *Deleted

## 2022-01-12 NOTE — Telephone Encounter (Signed)
Pt missed appointment today to have INR checked. Called pt and Lmom to reschedule.

## 2022-01-26 ENCOUNTER — Telehealth: Payer: Self-pay | Admitting: *Deleted

## 2022-01-26 NOTE — Telephone Encounter (Signed)
Called pt x 2 since she did not come to appt today; there was no answer so left another message to call back.

## 2022-02-07 ENCOUNTER — Ambulatory Visit (INDEPENDENT_AMBULATORY_CARE_PROVIDER_SITE_OTHER): Payer: Medicare Other

## 2022-02-07 DIAGNOSIS — Z5181 Encounter for therapeutic drug level monitoring: Secondary | ICD-10-CM | POA: Diagnosis not present

## 2022-02-07 DIAGNOSIS — I480 Paroxysmal atrial fibrillation: Secondary | ICD-10-CM

## 2022-02-07 LAB — POCT INR: INR: 2.1 (ref 2.0–3.0)

## 2022-02-07 NOTE — Patient Instructions (Addendum)
Description   Take 1.5 tablets today and then continue 1.5 tablets daily except for 1 tablet on Monday, Wednesday and Friday. Recheck INR in 4 weeks at [facility] office.  Procedure Fax [phone]  & Main [phone] Anticoagulation Clinic [phone]

## 2022-03-05 NOTE — Progress Notes (Unsigned)
Cardiology Office Note   Date:  03/06/2022   ID:  Renee Adams, DOB Feb 29, 1934, MRN [id number]  PCP:  Renee Graff.[name], MD    No chief complaint on file.  Prior CVA  Wt Readings from Last 3 Encounters:  03/06/22 150 lb (68 kg)  11/02/21 148 lb 14.4 oz (67.5 kg)  08/16/21 140 lb (63.5 kg)       History of Present Illness: Renee Adams is a 88 y.o. female  with prior CVA- possible PFO from prior echo:  Long term Coumadin for stroke prevention, started many years ago by Dr. Jaci Standard.    2012 echo showed Normal LV function, atrial septal aneurysm.   Walking limited by arthritis.   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  No surgery.  Denies : Chest pain. Dizziness. Leg edema. Nitroglycerin use. Orthopnea. Palpitations. Paroxysmal nocturnal dyspnea. Shortness of breath. Syncope.     Hip pain.    No bleeding problems.     Past Medical History:  Diagnosis Date   ASD (atrial septal defect)    Cancer (HCC)    breast, left   Carpal tunnel syndrome, bilateral    Depression    Gait disorder    GERD (gastroesophageal reflux disease)    Glaucoma    Glaucoma    History of stroke    Hypertension    IBS (irritable bowel syndrome)    Personal history of radiation therapy 1996   S/P bunionectomy    bilateral    Past Surgical History:  Procedure Laterality Date   ABDOMINAL HYSTERECTOMY     bladder resuspension     BREAST BIOPSY Left    BREAST BIOPSY Right    BREAST LUMPECTOMY Left 1996   CATARACT EXTRACTION Bilateral    INCISION AND DRAINAGE HIP  03/31/2012   Procedure: IRRIGATION AND DEBRIDEMENT HIP WITH POLY EXCHANGE;  Surgeon: Rudean Haskell, MD;  Location: [facility];  Service: Orthopedics;  Laterality: Left;   ORIF FEMUR FRACTURE Right 11/30/2012   Procedure: OPEN REDUCTION INTERNAL FIXATION (ORIF) DISTAL FEMUR FRACTURE;  Surgeon: Vickey Huger, MD;  Location: [facility];  Service: Orthopedics;  Laterality: Right;   TOTAL HIP  ARTHROPLASTY Bilateral    TOTAL KNEE ARTHROPLASTY Bilateral      Current Outpatient Medications  Medication Sig Dispense Refill   acetaminophen (TYLENOL) 500 MG tablet Take 1 tablet (500 mg total) by mouth every 8 (eight) hours as needed. (Patient taking differently: Take 500 mg by mouth every 8 (eight) hours as needed for moderate pain or headache.) 90 tablet 12   ALPRAZolam (XANAX) 0.25 MG tablet Take 4 tablets (1 mg total) by mouth every 4 (four) hours as needed for anxiety. 40 tablet 0   ketoconazole (NIZORAL) 2 % cream Apply topically daily as needed for irritation. 15 g 0   latanoprost (XALATAN) 0.005 % ophthalmic solution Place 1 drop into both eyes at bedtime. 2.5 mL 12   rOPINIRole (REQUIP) 1 MG tablet Take 1 tablet (1 mg total) by mouth at bedtime as needed. For restless legs (Patient taking differently: Take 1 mg by mouth at bedtime as needed (restless legs).) 30 tablet 0   sertraline (ZOLOFT) 100 MG tablet Take 1.5 tablets (150 mg total) by mouth daily. (Patient taking differently: Take 100 mg by mouth daily.) 30 tablet 1   traMADol (ULTRAM) 50 MG tablet Take 50 mg by mouth 2 (two) times daily as needed for moderate pain.     warfarin (COUMADIN)  2 MG tablet TAKE 1 TO 1 AND 1/2 TABLETS BY MOUTH DAILY AS DIRECTED BY THE COUMADIN CLINIC.  MUST KEEP APPT FOR FUTURE REFILLS. 60 tablet 0   calcium carbonate 200 MG capsule Take 3 capsules (600 mg total) by mouth 2 (two) times daily with a meal. (Patient not taking: Reported on 08/16/2021) 60 capsule 1   furosemide (LASIX) 40 MG tablet Take 1 tablet (40 mg total) by mouth daily as needed. (Patient not taking: Reported on 03/02/2021) 90 tablet 3   gabapentin (NEURONTIN) 100 MG capsule Take 3 capsules (300 mg total) by mouth every 8 (eight) hours as needed. (Patient not taking: Reported on 03/02/2021) 90 capsule 3   No current facility-administered medications for this visit.    Allergies:   Dilaudid [hydromorphone hcl], Hydromorphone, Morphine,  Morphine and related, Sulfamethoxazole, Sulfa antibiotics, Keflex [cephalexin], and Levaquin [levofloxacin]    Social History:  The patient  reports that she has never smoked. She has never used smokeless tobacco. She reports that she does not drink alcohol and does not use drugs.   Family History:  The patient's family history includes Breast cancer in her maternal aunt and mother; Cancer in her mother; Diabetes in her sister; Heart failure in her father; Osteoarthritis in her brother.    ROS:  Please see the history of present illness.   Otherwise, review of systems are positive for unsteadiness- fell.   All other systems are reviewed and negative.    PHYSICAL EXAM: VS:  BP 112/62   Pulse 87   Ht '5\' 3"'$  (1.6 m)   Wt 150 lb (68 kg)   SpO2 92%   BMI 26.57 kg/m  , BMI Body mass index is 26.57 kg/m. GEN: Well nourished, well developed, in no acute distress HEENT: normal Neck: no JVD, carotid bruits, or masses Cardiac: RRR; no murmurs, rubs, or gallops,no edema  Respiratory:  clear to auscultation bilaterally, normal work of breathing GI: soft, nontender, nondistended, + BS MS: hands: RA deformity  Skin: warm and dry, no rash Neuro:  Strength and sensation are intact Psych: euthymic mood, full affect   EKG:   The ekg ordered today demonstrates NSR, PRWP, no change from prior ECG   Recent Labs: 11/02/2021: ALT 14; BUN 23; Creatinine 0.78; Hemoglobin 13.7; Platelet Count 220; Potassium 4.4; Sodium 139   Lipid Panel    Component Value Date/Time   CHOL 147 03/08/2020 1555   TRIG 98 03/08/2020 1555   HDL 60 03/08/2020 1555   CHOLHDL 2.5 03/08/2020 1555   CHOLHDL 4 01/11/2014 1022   VLDL 31.2 01/11/2014 1022   LDLCALC 69 03/08/2020 1555     Other studies Reviewed: Additional studies/ records that were reviewed today with results demonstrating: labs reviewed , LDL 69 in 03/08/20.   ASSESSMENT AND PLAN:  CVA: Many years ago with concern about PFO.  No recent  symptoms. Hyperlipidemia: Controlled with diet.  Diet is somewhat limited as she lives alone and her hand deformities prevent complicated cooking. HTN: controlled currently on diet control.  High when she fell, but came down quickly.  LE edema: Elevate legs.  She has Lasix to use as needed. Anticoagulated: No bleeding problems.  We discussed the importance of avoiding falls.  If she were to have increasing unsteadiness, would have to reconsider the safety of anticoagulation.  In addition, if she needs further skin surgery or other procedures, could consider holding anticoagulation for several days prior to minimize bleeding risk.  INR to be checked today.  Current medicines are reviewed at length with the patient today.  The patient concerns regarding her medicines were addressed.  The following changes have been made:  No change  Labs/ tests ordered today include:  No orders of the defined types were placed in this encounter.   Recommend 150 minutes/week of aerobic exercise Low fat, low carb, high fiber diet recommended  Disposition:   FU in 1 year   Signed, Larae Grooms, MD  03/06/2022 3:11 PM    [facility] [place], [place], [place]  [address] Phone: [phone]; Fax: [phone]

## 2022-03-06 ENCOUNTER — Ambulatory Visit: Payer: Medicare Other | Attending: Interventional Cardiology | Admitting: Interventional Cardiology

## 2022-03-06 ENCOUNTER — Ambulatory Visit (INDEPENDENT_AMBULATORY_CARE_PROVIDER_SITE_OTHER): Payer: Medicare Other | Admitting: Pharmacist

## 2022-03-06 ENCOUNTER — Encounter: Payer: Self-pay | Admitting: Interventional Cardiology

## 2022-03-06 VITALS — BP 112/62 | HR 87 | Ht 63.0 in | Wt 150.0 lb

## 2022-03-06 DIAGNOSIS — Z5181 Encounter for therapeutic drug level monitoring: Secondary | ICD-10-CM

## 2022-03-06 DIAGNOSIS — I480 Paroxysmal atrial fibrillation: Secondary | ICD-10-CM | POA: Diagnosis not present

## 2022-03-06 DIAGNOSIS — I6789 Other cerebrovascular disease: Secondary | ICD-10-CM

## 2022-03-06 DIAGNOSIS — Z7901 Long term (current) use of anticoagulants: Secondary | ICD-10-CM | POA: Insufficient documentation

## 2022-03-06 DIAGNOSIS — E782 Mixed hyperlipidemia: Secondary | ICD-10-CM | POA: Insufficient documentation

## 2022-03-06 DIAGNOSIS — I639 Cerebral infarction, unspecified: Secondary | ICD-10-CM | POA: Insufficient documentation

## 2022-03-06 DIAGNOSIS — I10 Essential (primary) hypertension: Secondary | ICD-10-CM | POA: Insufficient documentation

## 2022-03-06 DIAGNOSIS — R600 Localized edema: Secondary | ICD-10-CM | POA: Diagnosis not present

## 2022-03-06 LAB — POCT INR: INR: 1.6 — AB (ref 2–3)

## 2022-03-06 NOTE — Patient Instructions (Signed)
Take 2 tablets today and and 1.5 tablets tomorrow then continue taking 1.5 tablets daily except for 1 tablet on Monday, Wednesday and Friday. Recheck INR in 2 weeks  Procedure Fax [phone]  & Main [phone] Anticoagulation Clinic [phone]

## 2022-03-06 NOTE — Patient Instructions (Signed)
Medication Instructions:  Your physician recommends that you continue on your current medications as directed. Please refer to the Current Medication list given to you today.  *If you need a refill on your cardiac medications before your next appointment, please call your pharmacy*   Lab Work: none If you have labs (blood work) drawn today and your tests are completely normal, you will receive your results only by: MyChart Message (if you have MyChart) OR A paper copy in the mail If you have any lab test that is abnormal or we need to change your treatment, we will call you to review the results.   Testing/Procedures: none   Follow-Up: At [facility], you and your health needs are our priority.  As part of our continuing mission to provide you with exceptional heart care, we have created designated Provider Care Teams.  These Care Teams include your primary Cardiologist (physician) and Advanced Practice Providers (APPs -  Physician Assistants and Nurse Practitioners) who all work together to provide you with the care you need, when you need it.  We recommend signing up for the patient portal called "MyChart".  Sign up information is provided on this After Visit Summary.  MyChart is used to connect with patients for Virtual Visits (Telemedicine).  Patients are able to view lab/test results, encounter notes, upcoming appointments, etc.  Non-urgent messages can be sent to your provider as well.   To learn more about what you can do with MyChart, go to [email or web address].com.    Your next appointment:   12 month(s)  The format for your next appointment:   In Person  Provider:   Jayadeep Varanasi, MD     Other Instructions    Important Information About Sugar       

## 2022-03-16 ENCOUNTER — Other Ambulatory Visit: Payer: Self-pay | Admitting: Interventional Cardiology

## 2022-03-16 DIAGNOSIS — I480 Paroxysmal atrial fibrillation: Secondary | ICD-10-CM

## 2022-03-16 DIAGNOSIS — Z5181 Encounter for therapeutic drug level monitoring: Secondary | ICD-10-CM

## 2022-03-20 ENCOUNTER — Telehealth: Payer: Self-pay

## 2022-03-20 ENCOUNTER — Ambulatory Visit: Payer: Medicare Other | Attending: Interventional Cardiology

## 2022-03-20 NOTE — Telephone Encounter (Signed)
Lpmtcb and reschedule INR appointment

## 2022-04-03 ENCOUNTER — Ambulatory Visit: Payer: Medicare Other | Attending: Interventional Cardiology

## 2022-04-20 ENCOUNTER — Ambulatory Visit: Payer: Medicare Other | Attending: Interventional Cardiology

## 2022-04-20 DIAGNOSIS — Z5181 Encounter for therapeutic drug level monitoring: Secondary | ICD-10-CM | POA: Diagnosis not present

## 2022-04-20 DIAGNOSIS — I6789 Other cerebrovascular disease: Secondary | ICD-10-CM

## 2022-04-20 LAB — POCT INR: INR: 1.3 — AB (ref 2.0–3.0)

## 2022-04-20 NOTE — Patient Instructions (Signed)
Take 2 tablets today and and 2 tablets tomorrow then INCREASE TO  1.5 tablets daily except for 1 tablet on Monday and Friday; Drinks 1 Boost per Day.  Recheck INR in 2 weeks  Procedure Fax [phone]  & Main [phone] Anticoagulation Clinic [phone]

## 2022-05-01 ENCOUNTER — Ambulatory Visit: Payer: Medicare Other | Attending: Interventional Cardiology | Admitting: *Deleted

## 2022-05-01 DIAGNOSIS — Z5181 Encounter for therapeutic drug level monitoring: Secondary | ICD-10-CM

## 2022-05-01 DIAGNOSIS — I480 Paroxysmal atrial fibrillation: Secondary | ICD-10-CM | POA: Diagnosis not present

## 2022-05-01 LAB — POCT INR: INR: 4.1 — AB (ref 2.0–3.0)

## 2022-05-01 NOTE — Patient Instructions (Signed)
Description   Do not take any Warfarin today and take 1 tablet on tomorrow then continue taking 1.5 tablets daily except for 1 tablet on Monday and Friday; Drinks 1 Boost per Day.  Recheck INR in 10 days.     Procedure Fax [phone]  & Main [phone] Anticoagulation Clinic [phone]

## 2022-05-09 ENCOUNTER — Other Ambulatory Visit: Payer: Self-pay | Admitting: Interventional Cardiology

## 2022-05-09 DIAGNOSIS — Z5181 Encounter for therapeutic drug level monitoring: Secondary | ICD-10-CM

## 2022-05-09 DIAGNOSIS — I480 Paroxysmal atrial fibrillation: Secondary | ICD-10-CM

## 2022-05-09 NOTE — Telephone Encounter (Signed)
Prescription refill request received for warfarin Lov: 03/06/22 [place])  Next INR check: 05/11/22 Warfarin tablet strength: '2mg'$   Appropriate dose and refill sent to requested pharmacy.

## 2022-05-11 ENCOUNTER — Ambulatory Visit: Payer: Medicare Other

## 2022-05-18 ENCOUNTER — Ambulatory Visit: Payer: Medicare Other

## 2022-05-30 ENCOUNTER — Ambulatory Visit: Payer: Medicare Other | Attending: Cardiology | Admitting: *Deleted

## 2022-05-30 DIAGNOSIS — I6789 Other cerebrovascular disease: Secondary | ICD-10-CM

## 2022-05-30 DIAGNOSIS — Z5181 Encounter for therapeutic drug level monitoring: Secondary | ICD-10-CM | POA: Diagnosis not present

## 2022-05-30 DIAGNOSIS — I480 Paroxysmal atrial fibrillation: Secondary | ICD-10-CM | POA: Diagnosis not present

## 2022-05-30 LAB — POCT INR: INR: 2 (ref 2.0–3.0)

## 2022-05-30 NOTE — Patient Instructions (Signed)
Description   Today take 2 tablets then continue taking 1.5 tablets daily except for 1 tablet on Monday and Friday; Drinks 1 Boost per Day.  Recheck INR in 3 weeks.  Procedure Fax [phone]  & Main [phone] Anticoagulation Clinic [phone]

## 2022-06-20 ENCOUNTER — Ambulatory Visit: Payer: Medicare Other | Attending: Cardiovascular Disease

## 2022-06-27 DIAGNOSIS — L03116 Cellulitis of left lower limb: Secondary | ICD-10-CM | POA: Diagnosis not present

## 2022-06-28 ENCOUNTER — Ambulatory Visit: Payer: Medicare Other | Attending: Interventional Cardiology | Admitting: *Deleted

## 2022-06-28 DIAGNOSIS — I480 Paroxysmal atrial fibrillation: Secondary | ICD-10-CM

## 2022-06-28 DIAGNOSIS — I6789 Other cerebrovascular disease: Secondary | ICD-10-CM

## 2022-06-28 DIAGNOSIS — Z5181 Encounter for therapeutic drug level monitoring: Secondary | ICD-10-CM

## 2022-06-28 LAB — POCT INR: INR: 5.3 — AB (ref 2.0–3.0)

## 2022-06-28 NOTE — Patient Instructions (Addendum)
Description   Do not take take any warfarin today and no warfarin Friday, Saturday take 1/2 tablet, Sunday take 1.5 tablets, Mondays take 1 tablet, Tuesday take 1 tablet, Wednesday take 1 tablets and report to appt on Thursday. Drinks 1 Boost per Day.  Recheck INR in 1 week. New dose will be 1 tablet daily except for 1.5 tablets on Monday, Wednesday and Friday;   Procedure Fax [phone]  & Main [phone] Anticoagulation Clinic [phone]

## 2022-06-29 ENCOUNTER — Telehealth: Payer: Self-pay | Admitting: *Deleted

## 2022-06-29 ENCOUNTER — Telehealth: Payer: Self-pay | Admitting: Licensed Clinical Social Worker

## 2022-06-29 DIAGNOSIS — R54 Age-related physical debility: Secondary | ICD-10-CM | POA: Diagnosis not present

## 2022-06-29 DIAGNOSIS — Z23 Encounter for immunization: Secondary | ICD-10-CM | POA: Diagnosis not present

## 2022-06-29 DIAGNOSIS — L03116 Cellulitis of left lower limb: Secondary | ICD-10-CM | POA: Diagnosis not present

## 2022-06-29 NOTE — Telephone Encounter (Signed)
Received a message to call the pt's dtr, Renee Adams, regarding pt's visit yesterday. Verbal consent given to by the pt yesterday at the appt to speak with her dtr regarding this. Returned call to the dtr and she wanted to let me know that the pt could come on Tuesday to have INR checked since on doxycycline as suggested yesterday. Advised that would be best since she is taking the doxycycline which interacts. Also, pt will hold warfarin today as instructed, 1/2 tablet warfarin Saturday, 1 tablet Sunday, 1 tablet Monday, and then recheck INR on Tuesday. I set an appointment and she will confirm with her sister law who will be bring the pt and call back if needed.

## 2022-06-29 NOTE — Telephone Encounter (Signed)
H&V Care Navigation CSW Progress Note  Clinical Social Worker contacted patient by phone to f/u on request for transportation resources per coumadin clinic/pt family. No answer at [phone]. I was able to leave a voicemail requesting a call back when able. If no call back will reattempt as able. My phone will be covered by [employer] next week while I am out of clinic as needed.   Patient is participating in a Managed Medicaid Plan:  No, Medicare A&B and [employer]  SDOH Screenings   Tobacco Use: Low Risk  (03/06/2022)    Westley Hummer, MSW, [place]  [phone]- work cell phone (preferred) [phone]- desk phone

## 2022-07-03 ENCOUNTER — Ambulatory Visit: Payer: Medicare Other | Attending: Cardiovascular Disease

## 2022-07-03 DIAGNOSIS — Z5181 Encounter for therapeutic drug level monitoring: Secondary | ICD-10-CM | POA: Diagnosis not present

## 2022-07-03 DIAGNOSIS — I480 Paroxysmal atrial fibrillation: Secondary | ICD-10-CM

## 2022-07-03 LAB — POCT INR: INR: 2 (ref 2.0–3.0)

## 2022-07-03 NOTE — Patient Instructions (Signed)
Description   Take 1 tablet today and 1 tablet tomorrow and then START taking 1 tablet daily except for 1.5 tablets on Monday, Wednesday and Friday;   Drinks 1 Boost per Day.   Recheck INR in 1 week.   Procedure Fax [phone]  & Main [phone]  Anticoagulation Clinic [phone]

## 2022-07-05 ENCOUNTER — Ambulatory Visit: Payer: Medicare Other

## 2022-07-10 ENCOUNTER — Ambulatory Visit: Payer: Medicare Other | Attending: Interventional Cardiology

## 2022-07-10 DIAGNOSIS — I6789 Other cerebrovascular disease: Secondary | ICD-10-CM | POA: Diagnosis not present

## 2022-07-10 DIAGNOSIS — I480 Paroxysmal atrial fibrillation: Secondary | ICD-10-CM | POA: Diagnosis not present

## 2022-07-10 DIAGNOSIS — Z5181 Encounter for therapeutic drug level monitoring: Secondary | ICD-10-CM

## 2022-07-10 LAB — POCT INR: INR: 5 — AB (ref 2.0–3.0)

## 2022-07-10 NOTE — Patient Instructions (Signed)
HOLD TODAY and WEDNESDAY THEN CONTINUE 1 tablet daily except for 1.5 tablets on Monday, Wednesday and Friday;   Drinks 1 Boost per Day.   Recheck INR in 1 week.   Procedure Fax [phone]  & Main [phone]  Anticoagulation Clinic [phone]

## 2022-07-10 NOTE — Progress Notes (Signed)
H&V Care Navigation CSW Progress Note  Clinical Social Worker met with patient and pt caregiver to discuss transportation resources. Introduced self, role, reason for visit. Pt shares she usually has a network of people to take her to appts but sometimes people have been busy lately. Family has expressed challenges with ensuring she makes it to all appts given volume of coumadin visits. LCSW provided my card, transportation resources list and [employer] application. Also mentioned that [employer] may have some transportation benefits that I am not aware of but pt may be eligible for. Encouraged family to call me as needed should they find themselves in a bind or need further clarity.   Patient is participating in a Managed Medicaid Plan:  No, traditional Medicare and [place]   Transportation Needs: No Transportation Needs (07/10/2022)  Tobacco Use: Low Risk  (03/06/2022)     Westley Hummer, MSW, [place]  [phone]- work cell phone (preferred) [phone]- desk phone

## 2022-07-12 DIAGNOSIS — H6120 Impacted cerumen, unspecified ear: Secondary | ICD-10-CM | POA: Diagnosis not present

## 2022-07-12 DIAGNOSIS — L03116 Cellulitis of left lower limb: Secondary | ICD-10-CM | POA: Diagnosis not present

## 2022-07-19 ENCOUNTER — Ambulatory Visit: Payer: Medicare Other | Attending: Interventional Cardiology | Admitting: Pharmacist

## 2022-07-19 DIAGNOSIS — I480 Paroxysmal atrial fibrillation: Secondary | ICD-10-CM

## 2022-07-19 DIAGNOSIS — Z5181 Encounter for therapeutic drug level monitoring: Secondary | ICD-10-CM

## 2022-07-19 DIAGNOSIS — I6789 Other cerebrovascular disease: Secondary | ICD-10-CM

## 2022-07-19 LAB — POCT INR: INR: 1.8 — AB (ref 2.0–3.0)

## 2022-07-19 NOTE — Patient Instructions (Signed)
Description   Take 1 and 1/2 tablets today and then continue regular schedule of 1 tablet daily except for 1.5 tablets on Monday, Wednesday and Friday;   Drinks 1 Boost per Day.   Recheck INR in 1 week.   Procedure Fax [phone]  & Main [phone]  Anticoagulation Clinic [phone]

## 2022-07-23 DIAGNOSIS — M25571 Pain in right ankle and joints of right foot: Secondary | ICD-10-CM | POA: Insufficient documentation

## 2022-07-23 DIAGNOSIS — M25552 Pain in left hip: Secondary | ICD-10-CM | POA: Diagnosis not present

## 2022-07-23 DIAGNOSIS — M25572 Pain in left ankle and joints of left foot: Secondary | ICD-10-CM | POA: Diagnosis not present

## 2022-07-30 ENCOUNTER — Ambulatory Visit: Payer: Medicare Other | Attending: Audiologist | Admitting: Audiologist

## 2022-07-30 DIAGNOSIS — H6122 Impacted cerumen, left ear: Secondary | ICD-10-CM | POA: Insufficient documentation

## 2022-07-30 NOTE — Procedures (Signed)
  Out[facility] and [place], [place]  [address] [phone]  AUDIOLOGICAL  EVALUATION  NAME: Renee Adams     DOB:   06/14/1934      MRN: [id number]                                                                                     DATE: 07/30/2022     REFERENT: Alroy Dust, L.[name], MD STATUS: Outpatient DIAGNOSIS: Cerumen Occlusion Left Ear    History: [name] was seen for an audiological evaluation due to significant difficulty hearing. [name] has had several pairs of hearing aids. She cannot use her current ones from [facility] because they are too weak, and too difficult to put in with her severe arthritis. [name] wants new hearing aids that are easier to put in. She denies any pain or pressure in either ear. She has music in her head like Christmas and patriotic tunes. She denies any buzzing or ringing sounds. [name] uses Qtips often. Case history obtained with Barbaraann Share wearing an amplifier called the Reizen Loud Ear.    Evaluation:  Otoscopy showed a clear view of the tympanic membrane in the right ear, left ear shows occluding cerumen Tympanometry results were consistent with normal middle ear function in right ear, and likely cerumen occlusion in the left ear Audiometric testing was not completed due to occluding cerumen, testing would not reflect accurate thresholds.    Results:  The test results were reviewed with [name] and her daughter. [name] has impacted wax in the left ear. I cannot see her eardrum and the tympanogram shows no eardrum movement. [name] used the Debrox. However she also was using Qtips. She likely softened the wax with the drops then pushed it deeper into the ear forming an impaction.   Recommendations: Debrox Earwax Removal Drops are a safe and inexpensive in-home solution for wax removal. Debrox Earwax Removal Kit includes a soft rubber bulb syringe to rinse your ear after using Debrox Earwax Removal Drops.  Excessive earwax build-up can lead to ear discomfort and reduced hearing, which can affect your day-to-day life. The kit can be purchased over the counter at [place], [facility], [facility], and most other pharmacies. STOP USING QTIPS.  Have hearing evaluation with provider who fits hearing aids. See list of local providers in [place] area. Have hearing tested once cerumen is removed.  Contact PCP about cerumen removal for left ear, or ask fitting practice if they will remove cerumen in office.   How to use the Debrox Earwax Removal Drops Kit:  tilt head sideways. place 5 to 10 drops into ear. tip of applicator should not enter ear canal. keep drops in ear for several minutes by keeping head tilted or placing cotton in the ear. use twice daily for up to four days  gently flush ear with water, using soft rubber bulb syringe after final treatment (on 4th day)     23 minutes spent testing and counseling on results.   If you have any questions please feel free to contact me at [phone].  Alfonse Alpers Audiologist, Au.D., CCC-A 07/30/2022  3:21 PM  Cc: Alroy Dust, L.[name], MD

## 2022-08-02 ENCOUNTER — Ambulatory Visit: Payer: Medicare Other | Attending: Internal Medicine

## 2022-08-02 DIAGNOSIS — I480 Paroxysmal atrial fibrillation: Secondary | ICD-10-CM

## 2022-08-02 DIAGNOSIS — Z5181 Encounter for therapeutic drug level monitoring: Secondary | ICD-10-CM | POA: Diagnosis not present

## 2022-08-02 LAB — POCT INR: INR: 2.8 (ref 2.0–3.0)

## 2022-08-02 NOTE — Patient Instructions (Signed)
Description   Continue taking 1 tablet daily except for 1.5 tablets on Monday, Wednesday and Friday;   Drinks 1 Boost per Day.   Recheck INR in 3 weeks Procedure Fax [phone]  Anticoagulation Clinic [phone]

## 2022-08-14 DIAGNOSIS — H9313 Tinnitus, bilateral: Secondary | ICD-10-CM | POA: Diagnosis not present

## 2022-08-14 DIAGNOSIS — H903 Sensorineural hearing loss, bilateral: Secondary | ICD-10-CM | POA: Diagnosis not present

## 2022-08-14 DIAGNOSIS — Z77122 Contact with and (suspected) exposure to noise: Secondary | ICD-10-CM | POA: Diagnosis not present

## 2022-08-14 DIAGNOSIS — Z822 Family history of deafness and hearing loss: Secondary | ICD-10-CM | POA: Diagnosis not present

## 2022-08-24 ENCOUNTER — Ambulatory Visit: Payer: Medicare Other | Attending: Interventional Cardiology | Admitting: *Deleted

## 2022-08-24 DIAGNOSIS — I480 Paroxysmal atrial fibrillation: Secondary | ICD-10-CM | POA: Insufficient documentation

## 2022-08-24 DIAGNOSIS — I6789 Other cerebrovascular disease: Secondary | ICD-10-CM | POA: Insufficient documentation

## 2022-08-24 DIAGNOSIS — Z5181 Encounter for therapeutic drug level monitoring: Secondary | ICD-10-CM | POA: Diagnosis not present

## 2022-08-24 LAB — POCT INR: INR: 2.6 (ref 2.0–3.0)

## 2022-08-24 NOTE — Patient Instructions (Signed)
Description   Continue taking 1 tablet daily except for 1.5 tablets on Monday, Wednesday and Friday;   Drinks 1 Boost per Day.   Recheck INR in 4 weeks Procedure Fax [phone]  Anticoagulation Clinic [phone]

## 2022-08-29 ENCOUNTER — Encounter: Payer: Self-pay | Admitting: Podiatry

## 2022-08-30 ENCOUNTER — Other Ambulatory Visit: Payer: Self-pay | Admitting: Hematology and Oncology

## 2022-09-03 ENCOUNTER — Ambulatory Visit: Payer: Medicare Other | Admitting: Podiatry

## 2022-09-07 ENCOUNTER — Ambulatory Visit (INDEPENDENT_AMBULATORY_CARE_PROVIDER_SITE_OTHER): Payer: Medicare Other | Admitting: Podiatry

## 2022-09-07 VITALS — BP 112/62

## 2022-09-07 DIAGNOSIS — G2581 Restless legs syndrome: Secondary | ICD-10-CM | POA: Diagnosis not present

## 2022-09-07 DIAGNOSIS — D2371 Other benign neoplasm of skin of right lower limb, including hip: Secondary | ICD-10-CM | POA: Diagnosis not present

## 2022-09-07 DIAGNOSIS — B351 Tinea unguium: Secondary | ICD-10-CM

## 2022-09-07 DIAGNOSIS — M79675 Pain in left toe(s): Secondary | ICD-10-CM | POA: Diagnosis not present

## 2022-09-07 DIAGNOSIS — F321 Major depressive disorder, single episode, moderate: Secondary | ICD-10-CM | POA: Diagnosis not present

## 2022-09-07 DIAGNOSIS — R54 Age-related physical debility: Secondary | ICD-10-CM | POA: Diagnosis not present

## 2022-09-07 DIAGNOSIS — M79674 Pain in right toe(s): Secondary | ICD-10-CM | POA: Diagnosis not present

## 2022-09-07 DIAGNOSIS — E559 Vitamin D deficiency, unspecified: Secondary | ICD-10-CM | POA: Diagnosis not present

## 2022-09-07 DIAGNOSIS — F411 Generalized anxiety disorder: Secondary | ICD-10-CM | POA: Diagnosis not present

## 2022-09-07 DIAGNOSIS — Z0000 Encounter for general adult medical examination without abnormal findings: Secondary | ICD-10-CM | POA: Diagnosis not present

## 2022-09-07 DIAGNOSIS — M159 Polyosteoarthritis, unspecified: Secondary | ICD-10-CM | POA: Diagnosis not present

## 2022-09-07 DIAGNOSIS — E2839 Other primary ovarian failure: Secondary | ICD-10-CM | POA: Diagnosis not present

## 2022-09-07 NOTE — Progress Notes (Signed)
   Chief Complaint  Patient presents with   Nail Problem    Routine foot care, nail trim, callus     SUBJECTIVE Patient presents to office today complaining of elongated, thickened nails that cause pain while ambulating in shoes.  Patient is unable to trim their own nails.  Patient also states he has developed a symptomatic callus to the plantar aspect of the right forefoot.  Patient is here for further evaluation and treatment.  Past Medical History:  Diagnosis Date   ASD (atrial septal defect)    Cancer (HCC)    breast, left   Carpal tunnel syndrome, bilateral    Depression    Gait disorder    GERD (gastroesophageal reflux disease)    Glaucoma    Glaucoma    History of stroke    Hypertension    IBS (irritable bowel syndrome)    Personal history of radiation therapy 1996   S/P bunionectomy    bilateral    Allergies  Allergen Reactions   Dilaudid [Hydromorphone Hcl] Shortness Of Breath    Tolerates tramadol   Hydromorphone Other (See Comments)    UNKNOWN TO PATIENT   Morphine Other (See Comments)    UNKNOWN TO PATIENT   Morphine And Related Shortness Of Breath    Tolerates tramadol   Sulfamethoxazole Other (See Comments)    UNKNOWN TO PATIENT   Sulfa Antibiotics Other (See Comments)    "Made me drunk";  wobbly   Keflex [Cephalexin] Nausea Only   Levaquin [Levofloxacin] Hives and Rash     OBJECTIVE General Patient is awake, alert, and oriented x 3 and in no acute distress. Derm Skin is dry and supple bilateral. Negative open lesions or macerations. Remaining integument unremarkable. Nails are tender, long, thickened and dystrophic with subungual debris, consistent with onychomycosis, 1-5 bilateral. No signs of infection noted.  Hyperkeratotic symptomatic callus also noted to the plantar aspect of the right forefoot Vasc  DP and PT pedal pulses palpable bilaterally. Temperature gradient within normal limits.  Neuro Epicritic and protective threshold sensation grossly  intact bilaterally.  Musculoskeletal Exam No symptomatic pedal deformities noted bilateral. Muscular strength within normal limits.  ASSESSMENT 1.  Pain due to onychomycosis of toenails both 2.  Symptomatic callus plantar aspect of the right forefoot  PLAN OF CARE 1. Patient evaluated today.  2. Instructed to maintain good pedal hygiene and foot care.  3. Mechanical debridement of nails 1-5 bilaterally performed using a nail nipper. Filed with dremel without incident.  4.  Excisional debridement of the hyperkeratotic callus was performed today using a 312 scalpel without incident or bleeding.  Patient felt relief  5.  Return to clinic in 3 mos. for routine footcare   Edrick Kins, DPM [facility]  Dr. Edrick Kins, DPM    [address], [place] [address]                Office [phone]  Fax [phone]

## 2022-09-11 ENCOUNTER — Other Ambulatory Visit: Payer: Self-pay | Admitting: Family Medicine

## 2022-09-11 DIAGNOSIS — E2839 Other primary ovarian failure: Secondary | ICD-10-CM

## 2022-09-11 DIAGNOSIS — Z1231 Encounter for screening mammogram for malignant neoplasm of breast: Secondary | ICD-10-CM

## 2022-09-19 ENCOUNTER — Ambulatory Visit: Payer: Medicare Other | Attending: Interventional Cardiology | Admitting: *Deleted

## 2022-09-19 DIAGNOSIS — Z5181 Encounter for therapeutic drug level monitoring: Secondary | ICD-10-CM | POA: Diagnosis not present

## 2022-09-19 DIAGNOSIS — I480 Paroxysmal atrial fibrillation: Secondary | ICD-10-CM

## 2022-09-19 LAB — POCT INR: INR: 2.2 (ref 2.0–3.0)

## 2022-09-19 NOTE — Patient Instructions (Signed)
Description   Continue taking 1 tablet daily except for 1.5 tablets on Monday, Wednesday and Friday;   Drinks 1 Boost per Day.  Recheck INR in 5 weeks. Procedure Fax [phone]  Anticoagulation Clinic [phone]

## 2022-09-24 DIAGNOSIS — H579 Unspecified disorder of eye and adnexa: Secondary | ICD-10-CM | POA: Diagnosis not present

## 2022-09-24 DIAGNOSIS — H1089 Other conjunctivitis: Secondary | ICD-10-CM | POA: Diagnosis not present

## 2022-09-24 DIAGNOSIS — H00011 Hordeolum externum right upper eyelid: Secondary | ICD-10-CM | POA: Diagnosis not present

## 2022-10-08 ENCOUNTER — Other Ambulatory Visit: Payer: Self-pay | Admitting: Interventional Cardiology

## 2022-10-08 DIAGNOSIS — Z5181 Encounter for therapeutic drug level monitoring: Secondary | ICD-10-CM

## 2022-10-08 DIAGNOSIS — I480 Paroxysmal atrial fibrillation: Secondary | ICD-10-CM

## 2022-10-08 NOTE — Telephone Encounter (Signed)
Warfarin 2mg  refill  Afib, CVA Last INR 09/19/22 Last OV 03/06/22

## 2022-10-24 ENCOUNTER — Ambulatory Visit: Payer: Medicare Other | Attending: Cardiovascular Disease | Admitting: Pharmacist

## 2022-10-24 DIAGNOSIS — Z5181 Encounter for therapeutic drug level monitoring: Secondary | ICD-10-CM | POA: Insufficient documentation

## 2022-10-24 DIAGNOSIS — I6789 Other cerebrovascular disease: Secondary | ICD-10-CM | POA: Insufficient documentation

## 2022-10-24 DIAGNOSIS — I480 Paroxysmal atrial fibrillation: Secondary | ICD-10-CM | POA: Insufficient documentation

## 2022-10-24 LAB — POCT INR: INR: 3 (ref 2.0–3.0)

## 2022-10-24 NOTE — Patient Instructions (Signed)
Description   Continue taking 1 tablet daily except for 1.5 tablets on Monday, Wednesday and Friday;   Drinks 1 Boost per Day.  Recheck INR in 6 weeks. Procedure Fax #[phone]  Anticoagulation Clinic [phone]

## 2022-11-25 DIAGNOSIS — M25552 Pain in left hip: Secondary | ICD-10-CM | POA: Insufficient documentation

## 2022-12-05 ENCOUNTER — Ambulatory Visit: Payer: Medicare Other

## 2022-12-10 ENCOUNTER — Ambulatory Visit: Payer: Medicare Other | Admitting: Podiatry

## 2022-12-13 ENCOUNTER — Other Ambulatory Visit (HOSPITAL_COMMUNITY): Payer: Self-pay | Admitting: Family Medicine

## 2022-12-13 ENCOUNTER — Ambulatory Visit: Payer: Medicare Other | Attending: Cardiology

## 2022-12-13 DIAGNOSIS — Z5181 Encounter for therapeutic drug level monitoring: Secondary | ICD-10-CM | POA: Diagnosis not present

## 2022-12-13 DIAGNOSIS — I480 Paroxysmal atrial fibrillation: Secondary | ICD-10-CM

## 2022-12-13 DIAGNOSIS — R601 Generalized edema: Secondary | ICD-10-CM

## 2022-12-13 DIAGNOSIS — R609 Edema, unspecified: Secondary | ICD-10-CM | POA: Diagnosis not present

## 2022-12-13 DIAGNOSIS — L821 Other seborrheic keratosis: Secondary | ICD-10-CM | POA: Diagnosis not present

## 2022-12-13 LAB — POCT INR: INR: 3.8 — AB (ref 2.0–3.0)

## 2022-12-13 NOTE — Patient Instructions (Signed)
Description   HOLD today's dose and then continue taking 1 tablet daily except for 1.5 tablets on Monday, Wednesday and Friday;   Drinks 1 Boost per Day.   Recheck INR in 5 weeks.  Procedure Fax #[phone]  Anticoagulation Clinic [phone]

## 2022-12-14 ENCOUNTER — Other Ambulatory Visit (HOSPITAL_BASED_OUTPATIENT_CLINIC_OR_DEPARTMENT_OTHER): Payer: Self-pay | Admitting: Family Medicine

## 2022-12-14 ENCOUNTER — Ambulatory Visit (INDEPENDENT_AMBULATORY_CARE_PROVIDER_SITE_OTHER): Payer: Medicare Other

## 2022-12-14 DIAGNOSIS — R600 Localized edema: Secondary | ICD-10-CM | POA: Diagnosis not present

## 2023-01-08 ENCOUNTER — Other Ambulatory Visit: Payer: Self-pay | Admitting: Interventional Cardiology

## 2023-01-08 DIAGNOSIS — Z5181 Encounter for therapeutic drug level monitoring: Secondary | ICD-10-CM

## 2023-01-08 DIAGNOSIS — I480 Paroxysmal atrial fibrillation: Secondary | ICD-10-CM

## 2023-01-08 NOTE — Telephone Encounter (Signed)
Warfarin 2mg  refill Afib Last INR 12/13/22 Last OV 03/06/22

## 2023-01-09 DIAGNOSIS — R5383 Other fatigue: Secondary | ICD-10-CM | POA: Diagnosis not present

## 2023-01-09 DIAGNOSIS — T7840XA Allergy, unspecified, initial encounter: Secondary | ICD-10-CM | POA: Diagnosis not present

## 2023-01-09 DIAGNOSIS — F321 Major depressive disorder, single episode, moderate: Secondary | ICD-10-CM | POA: Diagnosis not present

## 2023-01-09 DIAGNOSIS — R591 Generalized enlarged lymph nodes: Secondary | ICD-10-CM | POA: Diagnosis not present

## 2023-01-09 DIAGNOSIS — L659 Nonscarring hair loss, unspecified: Secondary | ICD-10-CM | POA: Diagnosis not present

## 2023-01-09 DIAGNOSIS — G2581 Restless legs syndrome: Secondary | ICD-10-CM | POA: Diagnosis not present

## 2023-01-09 DIAGNOSIS — M159 Polyosteoarthritis, unspecified: Secondary | ICD-10-CM | POA: Diagnosis not present

## 2023-01-09 DIAGNOSIS — K5900 Constipation, unspecified: Secondary | ICD-10-CM | POA: Diagnosis not present

## 2023-01-09 DIAGNOSIS — F411 Generalized anxiety disorder: Secondary | ICD-10-CM | POA: Diagnosis not present

## 2023-01-17 ENCOUNTER — Ambulatory Visit: Payer: Medicare Other | Attending: Internal Medicine

## 2023-01-17 DIAGNOSIS — Z5181 Encounter for therapeutic drug level monitoring: Secondary | ICD-10-CM | POA: Insufficient documentation

## 2023-01-17 DIAGNOSIS — I480 Paroxysmal atrial fibrillation: Secondary | ICD-10-CM | POA: Diagnosis not present

## 2023-01-17 LAB — POCT INR: INR: 2.1 (ref 2.0–3.0)

## 2023-01-17 NOTE — Patient Instructions (Signed)
Description   Continue taking 1 tablet daily except for 1.5 tablets on Monday, Wednesday and Friday;   Drinks 1 Boost per Day.  Recheck INR in 5 weeks. Procedure Fax #[phone]  Anticoagulation Clinic [phone]     

## 2023-01-18 ENCOUNTER — Other Ambulatory Visit: Payer: Self-pay | Admitting: *Deleted

## 2023-01-18 DIAGNOSIS — M7989 Other specified soft tissue disorders: Secondary | ICD-10-CM

## 2023-01-24 ENCOUNTER — Other Ambulatory Visit: Payer: Self-pay | Admitting: *Deleted

## 2023-01-24 DIAGNOSIS — Z170 Estrogen receptor positive status [ER+]: Secondary | ICD-10-CM

## 2023-01-25 ENCOUNTER — Inpatient Hospital Stay: Payer: Medicare Other | Attending: Hematology and Oncology

## 2023-01-25 ENCOUNTER — Inpatient Hospital Stay: Payer: Medicare Other | Admitting: Hematology and Oncology

## 2023-01-25 NOTE — Progress Notes (Deleted)
[facility], [facility]  [phone] [phone]     ID: Renee Adams DOB: 1933/10/26  MR#: [id number]  CSN#: [id number]  Patient Care Team: Clovis Riley, Elbert Ewings.August Saucer, MD as PCP - General (Family Medicine) Corky Crafts, MD as PCP - Cardiology (Cardiology) Pollyann Savoy, MD as Consulting Physician (Rheumatology) Magrinat, Valentino Hue, MD (Inactive) as Consulting Physician (Oncology) OTHER MD:  CHIEF COMPLAINT: Remote Hx of Left Breast Cancer  CURRENT TREATMENT: observation   INTERIM HISTORY: Renee Adams returns today for follow-up of her remote history of left breast cancer. She continues on observation alone.   Renee Adams is here for follow-up by herself.  She continues on observation and likes to come back to see Dr. Darnelle Catalan once a year.  She denies any new health complaints.  She most recently had a mammogram.  No changes in breathing, bowel habits or urinary habits.  Rest of the pertinent 10 point ROS reviewed and negative   COVID 19 VACCINATION STATUS: Pfizer x2, most recently 09/2019   BREAST CANCER HISTORY:  Renee Adams was originally diagnosed with a left breast carcinoma in early 1996. Tumor was T1, N0, ER and PR positive. She underwent left lumpectomy and sentinel lymph node dissection in March of 1996.  Patient was treated with tamoxifen for 5 years, then Evista for 3 years. The Evsita was then discontinued secondary to history of stroke.   Currently she is being followed with observation alone.   PAST MEDICAL HISTORY Past Medical History:  Diagnosis Date   ASD (atrial septal defect)    Cancer (HCC)    breast, left   Carpal tunnel syndrome, bilateral    Depression    Gait disorder    GERD (gastroesophageal reflux disease)    Glaucoma    Glaucoma    History of stroke    Hypertension    IBS (irritable bowel syndrome)    Personal history of radiation therapy 1996   S/P bunionectomy    bilateral    PAST SURGICAL HISTORY Past Surgical  History:  Procedure Laterality Date   ABDOMINAL HYSTERECTOMY     bladder resuspension     BREAST BIOPSY Left    BREAST BIOPSY Right    BREAST LUMPECTOMY Left 1996   CATARACT EXTRACTION Bilateral    INCISION AND DRAINAGE HIP  03/31/2012   Procedure: IRRIGATION AND DEBRIDEMENT HIP WITH POLY EXCHANGE;  Surgeon: Raymon Mutton, MD;  Location: [facility] OR;  Service: Orthopedics;  Laterality: Left;   ORIF FEMUR FRACTURE Right 11/30/2012   Procedure: OPEN REDUCTION INTERNAL FIXATION (ORIF) DISTAL FEMUR FRACTURE;  Surgeon: Dannielle Huh, MD;  Location: [facility] OR;  Service: Orthopedics;  Laterality: Right;   TOTAL HIP ARTHROPLASTY Bilateral    TOTAL KNEE ARTHROPLASTY Bilateral     FAMILY HISTORY Family History  Problem Relation Age of Onset   Cancer Mother    Breast cancer Mother    Heart failure Father    Diabetes Sister    Osteoarthritis Brother    Breast cancer Maternal Aunt    Heart attack Neg Hx     SOCIAL HISTORY: Updated June 2019 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 She lives alone in her own home with  no pets. The patient's daughter Renee Adams is a Arts development officer and lives in [place]. The patient's son Renee Adams is an Education officer, museum in [place]. The patient has 4 grandchildren.    ADVANCED DIRECTIVES: She has named both her children as healthcare powers of attorney   HEALTHCARE MAINTENANCE  Current Outpatient  Medications  Medication Sig Dispense Refill   acetaminophen (TYLENOL) 500 MG tablet Take 1 tablet (500 mg total) by mouth every 8 (eight) hours as needed. (Patient taking differently: Take 500 mg by mouth every 8 (eight) hours as needed for moderate pain or headache.) 90 tablet 12   ALPRAZolam (XANAX) 0.25 MG tablet Take 4 tablets (1 mg total) by mouth every 4 (four) hours as needed for anxiety. 40 tablet 0   calcium carbonate 200 MG capsule Take 3 capsules (600 mg total) by mouth 2 (two) times daily with a meal. (Patient not taking: Reported on 08/16/2021) 60 capsule 1    furosemide (LASIX) 40 MG tablet Take 1 tablet (40 mg total) by mouth daily as needed. (Patient not taking: Reported on 03/02/2021) 90 tablet 3   gabapentin (NEURONTIN) 100 MG capsule Take 3 capsules (300 mg total) by mouth every 8 (eight) hours as needed. (Patient not taking: Reported on 03/02/2021) 90 capsule 3   ketoconazole (NIZORAL) 2 % cream APPLY TO AFFECTED AREA ON THE SKIN DAILY AS NEEDED FOR IRRITATION 15 g 0   latanoprost (XALATAN) 0.005 % ophthalmic solution Place 1 drop into both eyes at bedtime. 2.5 mL 12   rOPINIRole (REQUIP) 1 MG tablet Take 1 tablet (1 mg total) by mouth at bedtime as needed. For restless legs (Patient taking differently: Take 1 mg by mouth at bedtime as needed (restless legs).) 30 tablet 0   sertraline (ZOLOFT) 100 MG tablet Take 1.5 tablets (150 mg total) by mouth daily. (Patient taking differently: Take 100 mg by mouth daily.) 30 tablet 1   traMADol (ULTRAM) 50 MG tablet Take 50 mg by mouth 2 (two) times daily as needed for moderate pain.     warfarin (COUMADIN) 2 MG tablet TAKE 1 TO 1 AND 1/2 TABLETS BY MOUTH DAILY AS DIRECTED BY THE COUMADIN CLINIC 120 tablet 0   No current facility-administered medications for this visit.    Allergies:  Allergies  Allergen Reactions   Dilaudid [Hydromorphone Hcl] Shortness Of Breath    Tolerates tramadol   Hydromorphone Other (See Comments)    UNKNOWN TO PATIENT   Morphine Other (See Comments)    UNKNOWN TO PATIENT   Morphine And Codeine Shortness Of Breath    Tolerates tramadol   Sulfamethoxazole Other (See Comments)    UNKNOWN TO PATIENT   Sulfa Antibiotics Other (See Comments)    "Made me drunk";  wobbly   Keflex [Cephalexin] Nausea Only   Levaquin [Levofloxacin] Hives and Rash    Physical Exam: white woman using a Rollator  There were no vitals filed for this visit.   Wt Readings from Last 3 Encounters:  03/06/22 150 lb (68 kg)  11/02/21 148 lb 14.4 oz (67.5 kg)  08/16/21 140 lb (63.5 kg)   There is no  height or weight on file to calculate BMI.    ECOG FS:2 - Symptomatic, <50% confined to bed  Physical Exam Constitutional:      Appearance: Normal appearance.  Cardiovascular:     Heart sounds: No murmur heard. Chest:     Comments: Bilateral breasts inspected.  Left breast smaller than the right breast.  Surgical scar noted.  No concerning masses or regional recurrence.  No palpable adenopathy. Musculoskeletal:     Cervical back: Normal range of motion and neck supple. No rigidity.  Lymphadenopathy:     Cervical: No cervical adenopathy.  Skin:    General: Skin is warm and dry.  Neurological:     General:  No focal deficit present.     Mental Status: She is alert.        Lab Results: Lab Results  Component Value Date   WBC 8.4 11/02/2021   HGB 13.7 11/02/2021   HCT 42.1 11/02/2021   MCV 91.3 11/02/2021   PLT 220 11/02/2021   NEUTROABS 5.5 11/02/2021     Chemistry      Component Value Date/Time   NA 139 11/02/2021 1247   NA 142 10/11/2016 1242   K 4.4 11/02/2021 1247   K 4.4 10/11/2016 1242   CL 107 11/02/2021 1247   CL 109 (H) 09/10/2012 1315   CO2 28 11/02/2021 1247   CO2 24 10/11/2016 1242   BUN 23 11/02/2021 1247   BUN 15.6 10/11/2016 1242   CREATININE 0.78 11/02/2021 1247   CREATININE 0.9 10/11/2016 1242      Component Value Date/Time   CALCIUM 9.4 11/02/2021 1247   CALCIUM 9.8 10/11/2016 1242   ALKPHOS 85 11/02/2021 1247   ALKPHOS 103 10/11/2016 1242   AST 22 11/02/2021 1247   AST 20 10/11/2016 1242   ALT 14 11/02/2021 1247   ALT 10 10/11/2016 1242   BILITOT 0.4 11/02/2021 1247   BILITOT 0.78 10/11/2016 1242      STUDIES: No results found.    Assessment:  89 y.o. [place] woman   (1)  status post left lumpectomy and sentinel lymph node dissection March 1996 for a T1, N0, ER/PR positive breast carcinoma.    (2)  Treated with tamoxifen for five years, then Evista for three years with the Evista discontinued secondary to stroke.    (3)   Currently being followed with observation alone.    Plan:  Collyn is now a little over 26 years out from definitive surgery for her breast cancer with no evidence of disease recurrence.  She is likely cured.  However she wanted to follow-up with [place] annually for an exam, this relieves her anxiety very much. There appears to be no concern for recurrence.  Most recent mammogram without any evidence of malignancy.  She will continue to come back in 1 year.  Total time spent: 20 minutes  *Total Encounter Time as defined by the Centers for Medicare and Medicaid Services includes, in addition to the face-to-face time of a patient visit (documented in the note above) non-face-to-face time: obtaining and reviewing outside history, ordering and reviewing medications, tests or procedures, care coordination (communications with other health care professionals or caregivers) and documentation in the medical record.

## 2023-02-08 ENCOUNTER — Ambulatory Visit (HOSPITAL_COMMUNITY): Admission: RE | Admit: 2023-02-08 | Payer: Medicare Other | Source: Ambulatory Visit

## 2023-02-08 ENCOUNTER — Encounter: Payer: Self-pay | Admitting: Vascular Surgery

## 2023-02-08 ENCOUNTER — Ambulatory Visit: Payer: Medicare Other | Admitting: Vascular Surgery

## 2023-02-08 VITALS — BP 135/82 | HR 79 | Temp 98.0°F | Resp 20 | Ht 63.0 in | Wt 148.0 lb

## 2023-02-08 DIAGNOSIS — M7989 Other specified soft tissue disorders: Secondary | ICD-10-CM | POA: Diagnosis not present

## 2023-02-08 DIAGNOSIS — I872 Venous insufficiency (chronic) (peripheral): Secondary | ICD-10-CM | POA: Diagnosis not present

## 2023-02-08 NOTE — Progress Notes (Signed)
Office Note     CC: Bilateral lower extremity swelling Requesting Provider:  Irven Coe, MD  HPI: Renee Adams is a 89 y.o. (April 15, 1934) female who presents at the request of Renee Adams, L.August Saucer, MD for evaluation of bilateral lower extremity swelling.  On exam, Renee Adams was doing well, accompanied by her friend.  A native of [place] she moved to [place] years ago.  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  She has 2 children and 3 grandchildren.  Renee Adams has severe rheumatoid arthritis which limits her independence.  She uses a cane to ambulate, but struggles doing some tasks due to the deformity of bilateral hands.  She denies symptoms of claudication, ischemic rest pain, tissue loss.    Regarding the lower extremities, Renee Adams has had bilateral knees and hips replaced.  Roughly 3 months ago, she had an episode of bilateral lower extremity swelling.  Cellulitis was diagnosed, and she was treated with antibiotics which significantly improved her edema.  Since that time, she has been working to ambulate while sitting at home watching TV-something she does on a regular basis due to her limited mobility.  Since elevating her lower extremities, she has had significant improvement in her swelling and edema.  No new wounds.  She has had discoloration on her lower legs for years, but notes no pain associated.  No prior history of DVT no prior history of venous procedures Unable to wear compression stockings due to loss of dexterity in the hands Anticoagulated-Coumadin.   Past Medical History:  Diagnosis Date   ASD (atrial septal defect)    Cancer (HCC)    breast, left   Carpal tunnel syndrome, bilateral    Depression    Gait disorder    GERD (gastroesophageal reflux disease)    Glaucoma    Glaucoma    History of stroke    Hypertension    IBS (irritable bowel syndrome)    Personal history of radiation therapy 1996    S/P bunionectomy    bilateral    Past Surgical History:  Procedure Laterality Date   ABDOMINAL HYSTERECTOMY     bladder resuspension     BREAST BIOPSY Left    BREAST BIOPSY Right    BREAST LUMPECTOMY Left 1996   CATARACT EXTRACTION Bilateral    INCISION AND DRAINAGE HIP  03/31/2012   Procedure: IRRIGATION AND DEBRIDEMENT HIP WITH POLY EXCHANGE;  Surgeon: Raymon Mutton, MD;  Location: [facility] OR;  Service: Orthopedics;  Laterality: Left;   ORIF FEMUR FRACTURE Right 11/30/2012   Procedure: OPEN REDUCTION INTERNAL FIXATION (ORIF) DISTAL FEMUR FRACTURE;  Surgeon: Dannielle Huh, MD;  Location: [facility] OR;  Service: Orthopedics;  Laterality: Right;   TOTAL HIP ARTHROPLASTY Bilateral    TOTAL KNEE ARTHROPLASTY Bilateral     Social History   Socioeconomic History   Marital status: Widowed    Spouse name: Not on file   Number of children: Not on file   Years of education: Not on file   Highest education level: Not on file  Occupational History   Not on file  Tobacco Use   Smoking status: Never   Smokeless tobacco: Never  Substance and Sexual Activity   Alcohol use: No   Drug use: No   Sexual activity: Not Currently  Other Topics Concern   Not on file  Social History Narrative   Not on file   Social Determinants of Health   Financial Resource  Strain: Not on file  Food Insecurity: Not on file  Transportation Needs: No Transportation Needs (07/10/2022)   PRAPARE - Administrator, Civil Service (Medical): No    Lack of Transportation (Non-Medical): No  Physical Activity: Not on file  Stress: Not on file  Social Connections: Not on file  Intimate Partner Violence: Not on file   Family History  Problem Relation Age of Onset   Cancer Mother    Breast cancer Mother    Heart failure Father    Diabetes Sister    Osteoarthritis Brother    Breast cancer Maternal Aunt    Heart attack Neg Hx     Current Outpatient Medications  Medication Sig Dispense Refill   acetaminophen  (TYLENOL) 500 MG tablet Take 1 tablet (500 mg total) by mouth every 8 (eight) hours as needed. (Patient taking differently: Take 500 mg by mouth every 8 (eight) hours as needed for moderate pain or headache.) 90 tablet 12   ALPRAZolam (XANAX) 0.25 MG tablet Take 4 tablets (1 mg total) by mouth every 4 (four) hours as needed for anxiety. 40 tablet 0   calcium carbonate 200 MG capsule Take 3 capsules (600 mg total) by mouth 2 (two) times daily with a meal. 60 capsule 1   ketoconazole (NIZORAL) 2 % cream APPLY TO AFFECTED AREA ON THE SKIN DAILY AS NEEDED FOR IRRITATION 15 g 0   rOPINIRole (REQUIP) 1 MG tablet Take 1 tablet (1 mg total) by mouth at bedtime as needed. For restless legs (Patient taking differently: Take 1 mg by mouth at bedtime as needed (restless legs).) 30 tablet 0   sertraline (ZOLOFT) 100 MG tablet Take 1.5 tablets (150 mg total) by mouth daily. (Patient taking differently: Take 100 mg by mouth daily.) 30 tablet 1   traMADol (ULTRAM) 50 MG tablet Take 50 mg by mouth 2 (two) times daily as needed for moderate pain.     warfarin (COUMADIN) 2 MG tablet TAKE 1 TO 1 AND 1/2 TABLETS BY MOUTH DAILY AS DIRECTED BY THE COUMADIN CLINIC 120 tablet 0   No current facility-administered medications for this visit.    Allergies  Allergen Reactions   Dilaudid [Hydromorphone Hcl] Shortness Of Breath    Tolerates tramadol   Hydromorphone Other (See Comments)    UNKNOWN TO PATIENT   Morphine Other (See Comments)    UNKNOWN TO PATIENT   Morphine And Codeine Shortness Of Breath    Tolerates tramadol   Sulfamethoxazole Other (See Comments)    UNKNOWN TO PATIENT   Sulfa Antibiotics Other (See Comments)    "Made me drunk";  wobbly   Keflex [Cephalexin] Nausea Only   Levaquin [Levofloxacin] Hives and Rash     REVIEW OF SYSTEMS:  [X]  denotes positive finding, [ ]  denotes negative finding Cardiac  Comments:  Chest pain or chest pressure:    Shortness of breath upon exertion:    Short of  breath when lying flat:    Irregular heart rhythm:        Vascular    Pain in calf, thigh, or hip brought on by ambulation:    Pain in feet at night that wakes you up from your sleep:     Blood clot in your veins:    Leg swelling:         Pulmonary    Oxygen at home:    Productive cough:     Wheezing:         Neurologic  Sudden weakness in arms or legs:     Sudden numbness in arms or legs:     Sudden onset of difficulty speaking or slurred speech:    Temporary loss of vision in one eye:     Problems with dizziness:         Gastrointestinal    Blood in stool:     Vomited blood:         Genitourinary    Burning when urinating:     Blood in urine:        Psychiatric    Major depression:         Hematologic    Bleeding problems:    Problems with blood clotting too easily:        Skin    Rashes or ulcers:        Constitutional    Fever or chills:      PHYSICAL EXAMINATION:  Vitals:   02/08/23 1135  BP: 135/82  Pulse: 79  Resp: 20  Temp: 98 F (36.7 C)  SpO2: 90%  Weight: 148 lb (67.1 kg)  Height: 5\' 3"  (1.6 m)    General:  WDWN in NAD; vital signs documented above Gait: Not observed HENT: WNL, normocephalic Pulmonary: normal non-labored breathing , without Rales, rhonchi,  wheezing Cardiac: regular HR Abdomen: soft, NT, no masses Skin: without rashes Vascular Exam/Pulses:  Right Left  Radial 2+ (normal) 2+ (normal)  Ulnar    Femoral    Popliteal    DP    PT 1+ (weak) 1+ (weak)   Extremities: without ischemic changes, without Gangrene , without cellulitis; without open wounds;  Hemosiderin deposition on bilateral lower extremities with some lipodermatosclerosis. No significant edema Musculoskeletal: no muscle wasting or atrophy  Neurologic: A&O X 3;  No focal weakness or paresthesias are detected Psychiatric:  The pt has Normal affect.   Non-Invasive Vascular Imaging:   Venous Reflux Times   +--------------+---------+------+-----------+------------+-------------+  RIGHT        Reflux NoRefluxReflux TimeDiameter cmsComments                               Yes                                        +--------------+---------+------+-----------+------------+-------------+  CFV                    yes   >1 second                            +--------------+---------+------+-----------+------------+-------------+  FV mid        no                                                   +--------------+---------+------+-----------+------------+-------------+  Popliteal    no                                                   +--------------+---------+------+-----------+------------+-------------+  GSV at [facility]  yes    >500 ms      0.77                   +--------------+---------+------+-----------+------------+-------------+  GSV prox thighno                            0.52                   +--------------+---------+------+-----------+------------+-------------+  GSV mid thigh no                            0.43                   +--------------+---------+------+-----------+------------+-------------+  GSV dist thighno                            0.41    out of fascia  +--------------+---------+------+-----------+------------+-------------+  GSV at knee   no                            0.41    out of fascia  +--------------+---------+------+-----------+------------+-------------+  SSV Pop Fossa           yes    >500 ms      0.35                   +--------------+---------+------+-----------+------------+-------------+  SSV prox calf           yes    >500 ms      0.44                   +--------------+---------+------+-----------+------------+-------------+  SSV mid calf            yes    >500 ms      0.45                   +--------------+---------+------+-----------+------------+-------------+      ASSESSMENT/PLAN:: 89 y.o. female presenting with history of bilateral lower extremity swelling compounded by cellulitis.  Fortunately, the cellulitis has resolved, and swelling improved with the use of antibiotics.  Over the last 3 months, she has worked to elevate the lower extremities and has had significant improvement over the course of that time.  Physical exam as findings of venous insufficiency-namely hemosiderin deposits and lipodermatosclerosis, which are chronic signs of venous hypertension.  The legs are small, no significant edema, no wounds.  Imaging was reviewed demonstrating venous insufficiency within the small saphenous vein, however there was little reflux otherwise.  With these findings, I do not think Ladena would benefit from venous ablation.  Being that symptoms have improved with simple elevation, we discussed continuing this for the foreseeable future.  Unfortunately, she is unable to wear compression stockings due to the loss of dexterity in the hands.  Kiri can follow-up with me as needed, as I am happy to see her should she run into problems.  At this point, her lower extremity edema from chronic venous insufficiency can continue to be managed conservatively with elevation.    Victorino Sparrow, MD [facility] [phone] Total time of patient care including pre-visit research, consultation, and documentation greater than 30 minutes

## 2023-02-20 ENCOUNTER — Other Ambulatory Visit: Payer: Self-pay

## 2023-02-20 DIAGNOSIS — I480 Paroxysmal atrial fibrillation: Secondary | ICD-10-CM

## 2023-02-20 DIAGNOSIS — Z5181 Encounter for therapeutic drug level monitoring: Secondary | ICD-10-CM

## 2023-02-20 MED ORDER — WARFARIN SODIUM 2 MG PO TABS
ORAL_TABLET | ORAL | 1 refills | Status: DC
Start: 2023-02-20 — End: 2023-09-17

## 2023-02-20 NOTE — Telephone Encounter (Signed)
Received faxed Warfarin refill request from [facility].

## 2023-02-21 ENCOUNTER — Ambulatory Visit: Payer: Medicare Other | Attending: Interventional Cardiology

## 2023-02-22 ENCOUNTER — Other Ambulatory Visit: Payer: Self-pay | Admitting: Hematology and Oncology

## 2023-02-25 NOTE — Telephone Encounter (Signed)
Per last OV, reorder. Lorayne Marek, RN

## 2023-02-28 ENCOUNTER — Ambulatory Visit
Admission: RE | Admit: 2023-02-28 | Discharge: 2023-02-28 | Disposition: A | Discharge status: Home / Self Care | Payer: Medicare Other | Source: Ambulatory Visit | Attending: Family Medicine | Admitting: Family Medicine

## 2023-02-28 ENCOUNTER — Other Ambulatory Visit: Payer: Medicare Other

## 2023-02-28 DIAGNOSIS — Z1231 Encounter for screening mammogram for malignant neoplasm of breast: Secondary | ICD-10-CM | POA: Diagnosis not present

## 2023-03-07 ENCOUNTER — Ambulatory Visit: Payer: Medicare Other | Attending: Interventional Cardiology

## 2023-03-07 DIAGNOSIS — Z5181 Encounter for therapeutic drug level monitoring: Secondary | ICD-10-CM | POA: Diagnosis not present

## 2023-03-07 DIAGNOSIS — I480 Paroxysmal atrial fibrillation: Secondary | ICD-10-CM

## 2023-03-07 LAB — POCT INR: INR: 4.8 — AB (ref 2.0–3.0)

## 2023-03-07 NOTE — Patient Instructions (Signed)
Description   HOLD today's dose and tomorrow's dose and then continue taking 1 tablet daily except for 1.5 tablets on Monday, Wednesday and Friday;   Drinks 1 boost per day.   Recheck INR in 10 days  Procedure Fax #[phone]  Anticoagulation Clinic [phone]

## 2023-03-08 ENCOUNTER — Telehealth: Payer: Self-pay | Admitting: *Deleted

## 2023-03-08 ENCOUNTER — Ambulatory Visit: Payer: Medicare Other | Admitting: Interventional Cardiology

## 2023-03-08 NOTE — Telephone Encounter (Signed)
Pam with Dr. Anabel Halon office called to inquire when pt's next INR appt is and to advise that the pt will be having dental extractions done in September (noted in yesterday's visit). She stated the pt will need to come off warfarin and advised that she can fax over a clearance to [phone]. She asked if the appt could be moved out advised pt was seen yesterday and INR was high (thin) so she will be returning sooner. Advised her INR depends on return dates. She confirmed the number and they will get things together regarding this.  She was thankful for the info.

## 2023-03-12 ENCOUNTER — Telehealth: Payer: Self-pay | Admitting: *Deleted

## 2023-03-12 ENCOUNTER — Ambulatory Visit: Payer: Medicare Other | Attending: Interventional Cardiology | Admitting: Physician Assistant

## 2023-03-12 ENCOUNTER — Encounter: Payer: Self-pay | Admitting: Physician Assistant

## 2023-03-12 VITALS — BP 128/76 | HR 89 | Ht 63.0 in | Wt 144.0 lb

## 2023-03-12 DIAGNOSIS — I639 Cerebral infarction, unspecified: Secondary | ICD-10-CM | POA: Diagnosis not present

## 2023-03-12 DIAGNOSIS — D6869 Other thrombophilia: Secondary | ICD-10-CM | POA: Diagnosis not present

## 2023-03-12 DIAGNOSIS — Z01810 Encounter for preprocedural cardiovascular examination: Secondary | ICD-10-CM | POA: Insufficient documentation

## 2023-03-12 DIAGNOSIS — I10 Essential (primary) hypertension: Secondary | ICD-10-CM | POA: Diagnosis not present

## 2023-03-12 DIAGNOSIS — I4811 Longstanding persistent atrial fibrillation: Secondary | ICD-10-CM | POA: Diagnosis not present

## 2023-03-12 DIAGNOSIS — M7989 Other specified soft tissue disorders: Secondary | ICD-10-CM | POA: Diagnosis not present

## 2023-03-12 DIAGNOSIS — E785 Hyperlipidemia, unspecified: Secondary | ICD-10-CM | POA: Insufficient documentation

## 2023-03-12 NOTE — Progress Notes (Signed)
Cardiology Office Note:  .   Date:  03/12/2023  ID:  Renee Adams, DOB 03-14-1934, MRN [id number] PCP: Irven Coe, MD  [facility] Providers Cardiologist:  Lance Muss, MD {  History of Present Illness: .   Renee Adams is a 89 y.o. female with a past medical history of prior CVA, possible PFO from prior echo on long-term Coumadin for stroke prevention started many years ago by Dr. Fraser Adams here for follow-up appointment and preop clearance.  2012 echocardiogram shows normal LVEF, atrial septal aneurysm.  Walking is typically limited by arthritis.  She has been diagnosed with Dupuytren's contracture which affects her hands.  No surgery indicated.  When she was seen a year ago by Dr. Eldridge Dace she was not having any chest pain, dizziness, leg edema, nitroglycerin use, orthopnea, palpitations, PND, shortness of breath, or syncope.  Was having some hip pain.  No bleeding problems.  Today, she tells me that she had some bad lower extremity edema and went to her family doctor in the walk-in clinic as well as cellulitis at that time.  She was started on doxycycline and Augmentin.  She is now off those medications.  Unfortunately, still having some swelling left greater than right.  However, it is resolving.  Heart rate and blood pressure stable today.  Discussed wearing lower extremity edema as tolerated.  She has a difficult time getting these on.  We talked about a zip up compression option.  Unfortunately, it is hard for her to get around at times because she has had both hips and knees replaced.  Otherwise, doing well from a CV standpoint    Reports no shortness of breath nor dyspnea on exertion. Reports no chest pain, pressure, or tightness. No edema, orthopnea, PND. Reports no palpitations.    ROS: Pertinent ROS in HPI  Studies Reviewed: Marland Kitchen   EKG Interpretation Date/Time:  Tuesday March 12 2023 13:57:24 EDT Ventricular Rate:  82 PR Interval:  166 QRS  Duration:  60 QT Interval:  378 QTC Calculation: 441 R Axis:   -46  Text Interpretation: Normal sinus rhythm Left axis deviation Minimal voltage criteria for LVH, may be normal variant ( R in aVL ) Inferior infarct , age undetermined Anterolateral infarct , age undetermined When compared with ECG of 02-Sep-2016 15:23, PREVIOUS ECG IS PRESENT Confirmed by Jari Favre [phone]) on 03/12/2023 2:16:04 PM    none  Physical Exam:   VS:  BP 128/76   Pulse 89   Ht 5\' 3"  (1.6 m)   Wt 144 lb (65.3 kg)   SpO2 92%   BMI 25.51 kg/m    Wt Readings from Last 3 Encounters:  03/12/23 144 lb (65.3 kg)  02/08/23 148 lb (67.1 kg)  03/06/22 150 lb (68 kg)    GEN: Well nourished, well developed in no acute distress NECK: No JVD; No carotid bruits CARDIAC: RRR, no murmurs, rubs, gallops RESPIRATORY:  Clear to auscultation without rales, wheezing or rhonchi  ABDOMEN: Soft, non-tender, non-distended EXTREMITIES:  No edema; No deformity   ASSESSMENT AND PLAN: .   1.  Preop Clearance Renee Adams's perioperative risk of a major cardiac event is 0.9% according to the Revised Cardiac Risk Index (RCRI).  Therefore, she is at low risk for perioperative complications.   Her functional capacity is fair at 4.4 METs according to the Duke Activity Status Index (DASI). Recommendations: According to ACC/AHA guidelines, no further cardiovascular testing needed.  The patient may proceed to surgery at acceptable risk.  Antiplatelet and/or Anticoagulation Recommendations:  Coumadin can by held for 5 days prior to surgery.  Please resume post op when felt to be safe. No bridge needed.   2. CVA -1997 first stroke post surgery -2004 second stroke post surgery -On chronic Coumadin for stroke prevention -Per our office protocol, can hold Coumadin 5 days prior to dental extractions and restart medically safe to do so -Would also discussed with neurologist  3.  Hyperlipidemia -lipid panel to be scanned to our  office -Continue current medication regimen  4.  Hypertension -Blood pressure well-controlled today, 128/76 -Continue current medication regimen -Would suggest monitoring blood pressure at home an hour to 2 hours after medications  5.  Lower extremity edema/cellulitis infection/venous stasis -Still has some residual lower extremity swelling left greater than right -Has completed her antibiotics -Elevation is recommended and compression once infection heals  6.  Anticoagulation   -last coumadin was 4.3 (per patient) -She has a planned INR check coming up before her dental surgery   Dispo: She can return in a year to see Dr. Lynnette Caffey or sooner if needed  Signed, Sharlene Dory, PA-C

## 2023-03-12 NOTE — Telephone Encounter (Signed)
   Pre-operative Risk Assessment    Patient Name: Renee Adams  DOB: 09/22/1933 MRN: [id number]    DATE OF LAST VISIT: 03/06/22 DR. VARANASI DATE OF NEXT VISIT: 03/12/23 TESSA CONTE, PAC  Request for Surgical Clearance    Procedure:  Dental Extraction - Amount of Teeth to be Pulled:  TOTAL OF 11 TEETH EXTRACTED IN 2 PHASES; TRIED TO REACH DDS OFFICE TO CONFIRM HOW MANY TEETH FOR EXTRACTION IN THE FIRST PHASE OF TREATMENT, NEED TO ALSO CONFIRM IF SURGICAL OR SIMPLE EXTRACTIONS : AND ALVEOPLASTY x 2  Date of Surgery:  Clearance TBD                                 Surgeon:  DR. Vivia Ewing, DMD Surgeon's Group or Practice Name:  [facility] Phone number:  [phone] Fax number:  [phone]   Type of Clearance Requested:   - Pharmacy:  Hold Warfarin (Coumadin) PER THE CLEARANCE FORM: MODERATE/HEAVY BLEEDING EXPECTED, GIVEN HER INCONSISTENT INR LEVELS. PLEASE CONSIDER LOVENOX BRIDGE IN THE PERIOPERATIVE PERIOD.    Type of Anesthesia:  Local  & NITROUS    Additional requests/questions:    Elpidio Anis   03/12/2023, 12:36 PM

## 2023-03-12 NOTE — Patient Instructions (Addendum)
Medication Instructions:   Your physician recommends that you continue on your current medications as directed. Please refer to the Current Medication list given to you today.   *If you need a refill on your cardiac medications before your next appointment, please call your pharmacy*   Lab Work:  MAKE SURE YOU GET LABS FROM PRIMARY    If you have labs (blood work) drawn today and your tests are completely normal, you will receive your results only by: MyChart Message (if you have MyChart) OR A paper copy in the mail If you have any lab test that is abnormal or we need to change your treatment, we will call you to review the results.   Testing/Procedures: NONE ORDERED  TODAY     Follow-Up: At [facility], you and your health needs are our priority.  As part of our continuing mission to provide you with exceptional heart care, we have created designated Provider Care Teams.  These Care Teams include your primary Cardiologist (physician) and Advanced Practice Providers (APPs -  Physician Assistants and Nurse Practitioners) who all work together to provide you with the care you need, when you need it.  We recommend signing up for the patient portal called "MyChart".  Sign up information is provided on this After Visit Summary.  MyChart is used to connect with patients for Virtual Visits (Telemedicine).  Patients are able to view lab/test results, encounter notes, upcoming appointments, etc.  Non-urgent messages can be sent to your provider as well.   To learn more about what you can do with MyChart, go to [email or web address].    Your next appointment:   1 year(s)  Provider:   Dr. Lynnette Caffey  Other Instructions Low-Sodium Eating Plan Salt (sodium) helps you keep a healthy balance of fluids in your body. Too much sodium can raise your blood pressure. It can also cause fluid and waste to be held in your body. Your health care provider or dietitian may recommend a low-sodium  eating plan if you have high blood pressure (hypertension), kidney disease, liver disease, or heart failure. Eating less sodium can help lower your blood pressure and reduce swelling. It can also protect your heart, liver, and kidneys. What are tips for following this plan? Reading food labels  Check food labels for the amount of sodium per serving. If you eat more than one serving, you must multiply the listed amount by the number of servings. Choose foods with less than 140 milligrams (mg) of sodium per serving. Avoid foods with 300 mg of sodium or more per serving. Always check how much sodium is in a product, even if the label says "unsalted" or "no salt added." Shopping  Buy products labeled as "low-sodium" or "no salt added." Buy fresh foods. Avoid canned foods and pre-made or frozen meals. Avoid canned, cured, or processed meats. Buy breads that have less than 80 mg of sodium per slice. Cooking  Eat more home-cooked food. Try to eat less restaurant, buffet, and fast food. Try not to add salt when you cook. Use salt-free seasonings or herbs instead of table salt or sea salt. Check with your provider or pharmacist before using salt substitutes. Cook with plant-based oils, such as canola, sunflower, or olive oil. Meal planning When eating at a restaurant, ask if your food can be made with less salt or no salt. Avoid dishes labeled as brined, pickled, cured, or smoked. Avoid dishes made with soy sauce, miso, or teriyaki sauce. Avoid foods that have monosodium glutamate (  MSG) in them. MSG may be added to some restaurant food, sauces, soups, bouillon, and canned foods. Make meals that can be grilled, baked, poached, roasted, or steamed. These are often made with less sodium. General information Try to limit your sodium intake to 1,500-2,300 mg each day, or the amount told by your provider. What foods should I eat? Fruits Fresh, frozen, or canned fruit. Fruit juice. Vegetables Fresh or  frozen vegetables. "No salt added" canned vegetables. "No salt added" tomato sauce and paste. Low-sodium or reduced-sodium tomato and vegetable juice. Grains Low-sodium cereals, such as oats, puffed wheat and rice, and shredded wheat. Low-sodium crackers. Unsalted rice. Unsalted pasta. Low-sodium bread. Whole grain breads and whole grain pasta. Meats and other proteins Fresh or frozen meat, poultry, seafood, and fish. These should have no added salt. Low-sodium canned tuna and salmon. Unsalted nuts. Dried peas, beans, and lentils without added salt. Unsalted canned beans. Eggs. Unsalted nut butters. Dairy Milk. Soy milk. Cheese that is naturally low in sodium, such as ricotta cheese, fresh mozzarella, or Swiss cheese. Low-sodium or reduced-sodium cheese. Cream cheese. Yogurt. Seasonings and condiments Fresh and dried herbs and spices. Salt-free seasonings. Low-sodium mustard and ketchup. Sodium-free salad dressing. Sodium-free light mayonnaise. Fresh or refrigerated horseradish. Lemon juice. Vinegar. Other foods Homemade, reduced-sodium, or low-sodium soups. Unsalted popcorn and pretzels. Low-salt or salt-free chips. The items listed above may not be all the foods and drinks you can have. Talk to a dietitian to learn more. What foods should I avoid? Vegetables Sauerkraut, pickled vegetables, and relishes. Olives. [place] fries. Onion rings. Regular canned vegetables, except low-sodium or reduced-sodium items. Regular canned tomato sauce and paste. Regular tomato and vegetable juice. Frozen vegetables in sauces. Grains Instant hot cereals. Bread stuffing, pancake, and biscuit mixes. Croutons. Seasoned rice or pasta mixes. Noodle soup cups. Boxed or frozen macaroni and cheese. Regular salted crackers. Self-rising flour. Meats and other proteins Meat or fish that is salted, canned, smoked, spiced, or pickled. Precooked or cured meat, such as sausages or meat loaves. Renee Adams. Ham. Pepperoni. Hot dogs.  Corned beef. Chipped beef. Salt pork. Jerky. Pickled herring, anchovies, and sardines. Regular canned tuna. Salted nuts. Dairy Processed cheese and cheese spreads. Hard cheeses. Cheese curds. Blue cheese. Feta cheese. String cheese. Regular cottage cheese. Buttermilk. Canned milk. Fats and oils Salted butter. Regular margarine. Ghee. Bacon fat. Seasonings and condiments Onion salt, garlic salt, seasoned salt, table salt, and sea salt. Canned and packaged gravies. Worcestershire sauce. Tartar sauce. Barbecue sauce. Teriyaki sauce. Soy sauce, including reduced-sodium soy sauce. Steak sauce. Fish sauce. Oyster sauce. Cocktail sauce. Horseradish that you find on the shelf. Regular ketchup and mustard. Meat flavorings and tenderizers. Bouillon cubes. Hot sauce. Pre-made or packaged marinades. Pre-made or packaged taco seasonings. Relishes. Regular salad dressings. Salsa. Other foods Salted popcorn and pretzels. Corn chips and puffs. Potato and tortilla chips. Canned or dried soups. Pizza. Frozen entrees and pot pies. The items listed above may not be all the foods and drinks you should avoid. Talk to a dietitian to learn more. This information is not intended to replace advice given to you by your health care provider. Make sure you discuss any questions you have with your health care provider. Document Revised: 07/12/2022 Document Reviewed: 07/12/2022 Elsevier Patient Education  2024 Elsevier Inc.  Heart-Healthy Eating Plan Eating a healthy diet is important for the health of your heart. A heart-healthy eating plan includes: Eating less unhealthy fats. Eating more healthy fats. Eating less salt in your food. Salt is  also called sodium. Making other changes in your diet. Talk with your doctor or a diet specialist (dietitian) to create an eating plan that is right for you. What is my plan? Your doctor may recommend an eating plan that includes: Total fat: ______% or less of total calories a  day. Saturated fat: ______% or less of total calories a day. Cholesterol: less than _________mg a day. Sodium: less than _________mg a day. What are tips for following this plan? Cooking Avoid frying your food. Try to bake, boil, grill, or broil it instead. You can also reduce fat by: Removing the skin from poultry. Removing all visible fats from meats. Steaming vegetables in water or broth. Meal planning  At meals, divide your plate into four equal parts: Fill one-half of your plate with vegetables and green salads. Fill one-fourth of your plate with whole grains. Fill one-fourth of your plate with lean protein foods. Eat 2-4 cups of vegetables per day. One cup of vegetables is: 1 cup (91 g) broccoli or cauliflower florets. 2 medium carrots. 1 large bell pepper. 1 large sweet potato. 1 large tomato. 1 medium white potato. 2 cups (150 g) raw leafy greens. Eat 1-2 cups of fruit per day. One cup of fruit is: 1 small apple 1 large banana 1 cup (237 g) mixed fruit, 1 large orange,  cup (82 g) dried fruit, 1 cup (240 mL) 100% fruit juice. Eat more foods that have soluble fiber. These are apples, broccoli, carrots, beans, peas, and barley. Try to get 20-30 g of fiber per day. Eat 4-5 servings of nuts, legumes, and seeds per week: 1 serving of dried beans or legumes equals  cup (90 g) cooked. 1 serving of nuts is  oz (12 almonds, 24 pistachios, or 7 walnut halves). 1 serving of seeds equals  oz (8 g). General information Eat more home-cooked food. Eat less restaurant, buffet, and fast food. Limit or avoid alcohol. Limit foods that are high in starch and sugar. Avoid fried foods. Lose weight if you are overweight. Keep track of how much salt (sodium) you eat. This is important if you have high blood pressure. Ask your doctor to tell you more about this. Try to add vegetarian meals each week. Fats Choose healthy fats. These include olive oil and canola oil, flaxseeds,  walnuts, almonds, and seeds. Eat more omega-3 fats. These include salmon, mackerel, sardines, tuna, flaxseed oil, and ground flaxseeds. Try to eat fish at least 2 times each week. Check food labels. Avoid foods with trans fats or high amounts of saturated fat. Limit saturated fats. These are often found in animal products, such as meats, butter, and cream. These are also found in plant foods, such as palm oil, palm kernel oil, and coconut oil. Avoid foods with partially hydrogenated oils in them. These have trans fats. Examples are stick margarine, some tub margarines, cookies, crackers, and other baked goods. What foods should I eat? Fruits All fresh, canned (in natural juice), or frozen fruits. Vegetables Fresh or frozen vegetables (raw, steamed, roasted, or grilled). Green salads. Grains Most grains. Choose whole wheat and whole grains most of the time. Rice and pasta, including brown rice and pastas made with whole wheat. Meats and other proteins Lean, well-trimmed beef, veal, pork, and lamb. Chicken and [place] without skin. All fish and shellfish. Wild duck, rabbit, pheasant, and venison. Egg whites or low-cholesterol egg substitutes. Dried beans, peas, lentils, and tofu. Seeds and most nuts. Dairy Low-fat or nonfat cheeses, including ricotta and mozzarella.  Skim or 1% milk that is liquid, powdered, or evaporated. Buttermilk that is made with low-fat milk. Nonfat or low-fat yogurt. Fats and oils Non-hydrogenated (trans-free) margarines. Vegetable oils, including soybean, sesame, sunflower, olive, peanut, safflower, corn, canola, and cottonseed. Salad dressings or mayonnaise made with a vegetable oil. Beverages Mineral water. Coffee and tea. Diet carbonated beverages. Sweets and desserts Sherbet, gelatin, and fruit ice. Small amounts of dark chocolate. Limit all sweets and desserts. Seasonings and condiments All seasonings and condiments. The items listed above may not be a complete  list of foods and drinks you can eat. Contact a dietitian for more options. What foods should I avoid? Fruits Canned fruit in heavy syrup. Fruit in cream or butter sauce. Fried fruit. Limit coconut. Vegetables Vegetables cooked in cheese, cream, or butter sauce. Fried vegetables. Grains Breads that are made with saturated or trans fats, oils, or whole milk. Croissants. Sweet rolls. Donuts. High-fat crackers, such as cheese crackers. Meats and other proteins Fatty meats, such as hot dogs, ribs, sausage, bacon, rib-eye roast or steak. High-fat deli meats, such as salami and bologna. Caviar. Domestic duck and goose. Organ meats, such as liver. Dairy Cream, sour cream, cream cheese, and creamed cottage cheese. Whole-milk cheeses. Whole or 2% milk that is liquid, evaporated, or condensed. Whole buttermilk. Cream sauce or high-fat cheese sauce. Yogurt that is made from whole milk. Fats and oils Meat fat, or shortening. Cocoa butter, hydrogenated oils, palm oil, coconut oil, palm kernel oil. Solid fats and shortenings, including bacon fat, salt pork, lard, and butter. Nondairy cream substitutes. Salad dressings with cheese or sour cream. Beverages Regular sodas and juice drinks with added sugar. Sweets and desserts Frosting. Pudding. Cookies. Cakes. Pies. Milk chocolate or white chocolate. Buttered syrups. Full-fat ice cream or ice cream drinks. The items listed above may not be a complete list of foods and drinks to avoid. Contact a dietitian for more information. Summary Heart-healthy meal planning includes eating less unhealthy fats, eating more healthy fats, and making other changes in your diet. Eat a balanced diet. This includes fruits and vegetables, low-fat or nonfat dairy, lean protein, nuts and legumes, whole grains, and heart-healthy oils and fats. This information is not intended to replace advice given to you by your health care provider. Make sure you discuss any questions you have with  your health care provider. Document Revised: 07/31/2021 Document Reviewed: 07/31/2021 Elsevier Patient Education  2024 [employer].

## 2023-03-12 NOTE — Telephone Encounter (Signed)
Patient with diagnosis of CVA on warfarin for anticoagulation.    Procedure: 5 teeth extraction then 6 teeth extraction Date of procedure:Sept 17, 24 and May 14, 2023   Patient does NOT require pre-op antibiotics for dental procedure.  Per office protocol, patient can hold warfarin for 5 days prior to procedure.   Patient will NOT need bridging with Lovenox (enoxaparin) around procedure.  Patient does not have afib. She was previously approved to hold warfarin without a bridge due to fragility.  -1997 first stroke post surgery -2004 second stroke post surgery   **This guidance is not considered finalized until pre-operative APP has relayed final recommendations.**

## 2023-03-18 ENCOUNTER — Ambulatory Visit: Payer: Medicare Other | Attending: Cardiovascular Disease

## 2023-03-18 DIAGNOSIS — I480 Paroxysmal atrial fibrillation: Secondary | ICD-10-CM | POA: Diagnosis not present

## 2023-03-18 DIAGNOSIS — Z5181 Encounter for therapeutic drug level monitoring: Secondary | ICD-10-CM | POA: Diagnosis not present

## 2023-03-18 LAB — PROTIME-INR
INR: 8 (ref 0.9–1.2)
Prothrombin Time: 77.2 s — ABNORMAL HIGH (ref 9.1–12.0)

## 2023-03-18 LAB — POCT INR: INR: 7.8 — AB (ref 2.0–3.0)

## 2023-03-18 NOTE — Patient Instructions (Signed)
Description   STAT Lab Results 8.0 - Called and spoke with pt. Instructed to HOLD Warfarin until post procedure. Post procedure on 8/17, take 1.5 tablets and on 9/18 take 2 tablets and then START taking 1 tablet daily except for 1.5 tablets on Wednesday or as directed by MD.  Advised pt to seek immediate medical attention if any signs or symptoms of bleeding occur.  Drinks 1 boost per day.   Recheck INR 1 week post procedure.  Procedure Fax for Cardiac Clearance  #[phone]  Anticoagulation Clinic [phone]

## 2023-04-01 ENCOUNTER — Ambulatory Visit: Payer: Medicare Other

## 2023-04-01 DIAGNOSIS — R112 Nausea with vomiting, unspecified: Secondary | ICD-10-CM | POA: Diagnosis not present

## 2023-04-01 DIAGNOSIS — Z8673 Personal history of transient ischemic attack (TIA), and cerebral infarction without residual deficits: Secondary | ICD-10-CM | POA: Diagnosis not present

## 2023-04-01 DIAGNOSIS — Z853 Personal history of malignant neoplasm of breast: Secondary | ICD-10-CM | POA: Diagnosis not present

## 2023-04-01 DIAGNOSIS — I771 Stricture of artery: Secondary | ICD-10-CM | POA: Diagnosis not present

## 2023-04-01 DIAGNOSIS — Z96653 Presence of artificial knee joint, bilateral: Secondary | ICD-10-CM | POA: Diagnosis present

## 2023-04-01 DIAGNOSIS — Z923 Personal history of irradiation: Secondary | ICD-10-CM | POA: Diagnosis not present

## 2023-04-01 DIAGNOSIS — E785 Hyperlipidemia, unspecified: Secondary | ICD-10-CM | POA: Diagnosis present

## 2023-04-01 DIAGNOSIS — R059 Cough, unspecified: Secondary | ICD-10-CM | POA: Diagnosis present

## 2023-04-01 DIAGNOSIS — R918 Other nonspecific abnormal finding of lung field: Secondary | ICD-10-CM | POA: Diagnosis not present

## 2023-04-01 DIAGNOSIS — K56609 Unspecified intestinal obstruction, unspecified as to partial versus complete obstruction: Secondary | ICD-10-CM | POA: Diagnosis not present

## 2023-04-01 DIAGNOSIS — K219 Gastro-esophageal reflux disease without esophagitis: Secondary | ICD-10-CM | POA: Diagnosis not present

## 2023-04-01 DIAGNOSIS — R140 Abdominal distension (gaseous): Secondary | ICD-10-CM | POA: Diagnosis not present

## 2023-04-01 DIAGNOSIS — K529 Noninfective gastroenteritis and colitis, unspecified: Secondary | ICD-10-CM | POA: Diagnosis not present

## 2023-04-01 DIAGNOSIS — R0602 Shortness of breath: Secondary | ICD-10-CM | POA: Diagnosis not present

## 2023-04-01 DIAGNOSIS — Z885 Allergy status to narcotic agent status: Secondary | ICD-10-CM | POA: Diagnosis not present

## 2023-04-01 DIAGNOSIS — R1084 Generalized abdominal pain: Secondary | ICD-10-CM | POA: Diagnosis not present

## 2023-04-01 DIAGNOSIS — H9202 Otalgia, left ear: Secondary | ICD-10-CM | POA: Diagnosis present

## 2023-04-01 DIAGNOSIS — H409 Unspecified glaucoma: Secondary | ICD-10-CM | POA: Diagnosis present

## 2023-04-01 DIAGNOSIS — Z5181 Encounter for therapeutic drug level monitoring: Secondary | ICD-10-CM | POA: Diagnosis not present

## 2023-04-01 DIAGNOSIS — Q2112 Patent foramen ovale: Secondary | ICD-10-CM | POA: Diagnosis not present

## 2023-04-01 DIAGNOSIS — Z881 Allergy status to other antibiotic agents status: Secondary | ICD-10-CM | POA: Diagnosis not present

## 2023-04-01 DIAGNOSIS — K429 Umbilical hernia without obstruction or gangrene: Secondary | ICD-10-CM | POA: Diagnosis not present

## 2023-04-01 DIAGNOSIS — R109 Unspecified abdominal pain: Secondary | ICD-10-CM | POA: Diagnosis not present

## 2023-04-01 DIAGNOSIS — F419 Anxiety disorder, unspecified: Secondary | ICD-10-CM | POA: Diagnosis not present

## 2023-04-01 DIAGNOSIS — I10 Essential (primary) hypertension: Secondary | ICD-10-CM | POA: Diagnosis not present

## 2023-04-01 DIAGNOSIS — B9620 Unspecified Escherichia coli [E. coli] as the cause of diseases classified elsewhere: Secondary | ICD-10-CM | POA: Diagnosis present

## 2023-04-01 DIAGNOSIS — I480 Paroxysmal atrial fibrillation: Secondary | ICD-10-CM | POA: Diagnosis not present

## 2023-04-01 DIAGNOSIS — K5730 Diverticulosis of large intestine without perforation or abscess without bleeding: Secondary | ICD-10-CM | POA: Diagnosis not present

## 2023-04-01 DIAGNOSIS — N390 Urinary tract infection, site not specified: Secondary | ICD-10-CM | POA: Diagnosis present

## 2023-04-01 DIAGNOSIS — Z4682 Encounter for fitting and adjustment of non-vascular catheter: Secondary | ICD-10-CM | POA: Diagnosis not present

## 2023-04-01 DIAGNOSIS — G2581 Restless legs syndrome: Secondary | ICD-10-CM | POA: Diagnosis present

## 2023-04-01 DIAGNOSIS — Z79899 Other long term (current) drug therapy: Secondary | ICD-10-CM | POA: Diagnosis not present

## 2023-04-01 DIAGNOSIS — K5669 Other partial intestinal obstruction: Secondary | ICD-10-CM | POA: Diagnosis not present

## 2023-04-01 DIAGNOSIS — Z888 Allergy status to other drugs, medicaments and biological substances status: Secondary | ICD-10-CM | POA: Diagnosis not present

## 2023-04-01 DIAGNOSIS — Z7901 Long term (current) use of anticoagulants: Secondary | ICD-10-CM | POA: Diagnosis not present

## 2023-04-01 DIAGNOSIS — Z1152 Encounter for screening for COVID-19: Secondary | ICD-10-CM | POA: Diagnosis not present

## 2023-04-01 LAB — POCT INR: INR: 1.6 — AB (ref 2.0–3.0)

## 2023-04-01 NOTE — Patient Instructions (Signed)
TAKE 1.5 tablets today only then continue 1 tablet Daily, except 1.5 tablets on Wednesdays. Drinks 1 boost per day.   Recheck INR 2 weeks  Procedure Fax for Cardiac Clearance  #[phone]  Anticoagulation Clinic [phone]

## 2023-04-02 ENCOUNTER — Other Ambulatory Visit: Payer: Self-pay

## 2023-04-02 ENCOUNTER — Inpatient Hospital Stay (HOSPITAL_COMMUNITY)
Admission: EM | Admit: 2023-04-02 | Discharge: 2023-04-08 | DRG: 392 | Disposition: A | Discharge status: Home / Self Care | Payer: Medicare Other | Attending: Internal Medicine | Admitting: Internal Medicine

## 2023-04-02 ENCOUNTER — Emergency Department (HOSPITAL_COMMUNITY): Payer: Medicare Other

## 2023-04-02 ENCOUNTER — Observation Stay (HOSPITAL_COMMUNITY): Payer: Medicare Other

## 2023-04-02 ENCOUNTER — Encounter (HOSPITAL_COMMUNITY): Payer: Self-pay | Admitting: Internal Medicine

## 2023-04-02 DIAGNOSIS — Z7901 Long term (current) use of anticoagulants: Secondary | ICD-10-CM | POA: Diagnosis not present

## 2023-04-02 DIAGNOSIS — I480 Paroxysmal atrial fibrillation: Secondary | ICD-10-CM | POA: Diagnosis present

## 2023-04-02 DIAGNOSIS — B9620 Unspecified Escherichia coli [E. coli] as the cause of diseases classified elsewhere: Secondary | ICD-10-CM | POA: Diagnosis present

## 2023-04-02 DIAGNOSIS — B9689 Other specified bacterial agents as the cause of diseases classified elsewhere: Secondary | ICD-10-CM | POA: Diagnosis present

## 2023-04-02 DIAGNOSIS — K219 Gastro-esophageal reflux disease without esophagitis: Secondary | ICD-10-CM | POA: Diagnosis present

## 2023-04-02 DIAGNOSIS — Q2112 Patent foramen ovale: Secondary | ICD-10-CM

## 2023-04-02 DIAGNOSIS — K56609 Unspecified intestinal obstruction, unspecified as to partial versus complete obstruction: Secondary | ICD-10-CM | POA: Diagnosis not present

## 2023-04-02 DIAGNOSIS — Z9841 Cataract extraction status, right eye: Secondary | ICD-10-CM

## 2023-04-02 DIAGNOSIS — Z853 Personal history of malignant neoplasm of breast: Secondary | ICD-10-CM

## 2023-04-02 DIAGNOSIS — H9190 Unspecified hearing loss, unspecified ear: Secondary | ICD-10-CM | POA: Diagnosis present

## 2023-04-02 DIAGNOSIS — R0602 Shortness of breath: Secondary | ICD-10-CM | POA: Diagnosis present

## 2023-04-02 DIAGNOSIS — Z885 Allergy status to narcotic agent status: Secondary | ICD-10-CM

## 2023-04-02 DIAGNOSIS — Z803 Family history of malignant neoplasm of breast: Secondary | ICD-10-CM

## 2023-04-02 DIAGNOSIS — R1084 Generalized abdominal pain: Secondary | ICD-10-CM | POA: Diagnosis not present

## 2023-04-02 DIAGNOSIS — Z833 Family history of diabetes mellitus: Secondary | ICD-10-CM

## 2023-04-02 DIAGNOSIS — K5669 Other partial intestinal obstruction: Secondary | ICD-10-CM | POA: Diagnosis not present

## 2023-04-02 DIAGNOSIS — G479 Sleep disorder, unspecified: Secondary | ICD-10-CM | POA: Diagnosis present

## 2023-04-02 DIAGNOSIS — Z4682 Encounter for fitting and adjustment of non-vascular catheter: Secondary | ICD-10-CM | POA: Diagnosis not present

## 2023-04-02 DIAGNOSIS — K529 Noninfective gastroenteritis and colitis, unspecified: Secondary | ICD-10-CM | POA: Diagnosis not present

## 2023-04-02 DIAGNOSIS — H9202 Otalgia, left ear: Secondary | ICD-10-CM | POA: Diagnosis present

## 2023-04-02 DIAGNOSIS — R112 Nausea with vomiting, unspecified: Secondary | ICD-10-CM | POA: Diagnosis not present

## 2023-04-02 DIAGNOSIS — I10 Essential (primary) hypertension: Secondary | ICD-10-CM | POA: Diagnosis present

## 2023-04-02 DIAGNOSIS — G2581 Restless legs syndrome: Secondary | ICD-10-CM | POA: Diagnosis present

## 2023-04-02 DIAGNOSIS — F419 Anxiety disorder, unspecified: Secondary | ICD-10-CM | POA: Diagnosis not present

## 2023-04-02 DIAGNOSIS — Z9842 Cataract extraction status, left eye: Secondary | ICD-10-CM

## 2023-04-02 DIAGNOSIS — Z1152 Encounter for screening for COVID-19: Secondary | ICD-10-CM

## 2023-04-02 DIAGNOSIS — Z882 Allergy status to sulfonamides status: Secondary | ICD-10-CM

## 2023-04-02 DIAGNOSIS — Z96653 Presence of artificial knee joint, bilateral: Secondary | ICD-10-CM | POA: Diagnosis present

## 2023-04-02 DIAGNOSIS — Z923 Personal history of irradiation: Secondary | ICD-10-CM

## 2023-04-02 DIAGNOSIS — K429 Umbilical hernia without obstruction or gangrene: Secondary | ICD-10-CM | POA: Diagnosis not present

## 2023-04-02 DIAGNOSIS — Z8673 Personal history of transient ischemic attack (TIA), and cerebral infarction without residual deficits: Secondary | ICD-10-CM

## 2023-04-02 DIAGNOSIS — Z90710 Acquired absence of both cervix and uterus: Secondary | ICD-10-CM

## 2023-04-02 DIAGNOSIS — Z79899 Other long term (current) drug therapy: Secondary | ICD-10-CM

## 2023-04-02 DIAGNOSIS — N390 Urinary tract infection, site not specified: Secondary | ICD-10-CM | POA: Diagnosis present

## 2023-04-02 DIAGNOSIS — Z8249 Family history of ischemic heart disease and other diseases of the circulatory system: Secondary | ICD-10-CM

## 2023-04-02 DIAGNOSIS — K5730 Diverticulosis of large intestine without perforation or abscess without bleeding: Secondary | ICD-10-CM | POA: Diagnosis not present

## 2023-04-02 DIAGNOSIS — Z881 Allergy status to other antibiotic agents status: Secondary | ICD-10-CM

## 2023-04-02 DIAGNOSIS — E785 Hyperlipidemia, unspecified: Secondary | ICD-10-CM | POA: Diagnosis present

## 2023-04-02 DIAGNOSIS — I517 Cardiomegaly: Secondary | ICD-10-CM | POA: Diagnosis present

## 2023-04-02 DIAGNOSIS — H409 Unspecified glaucoma: Secondary | ICD-10-CM | POA: Diagnosis present

## 2023-04-02 DIAGNOSIS — I4891 Unspecified atrial fibrillation: Secondary | ICD-10-CM | POA: Diagnosis present

## 2023-04-02 DIAGNOSIS — Z96643 Presence of artificial hip joint, bilateral: Secondary | ICD-10-CM | POA: Diagnosis present

## 2023-04-02 DIAGNOSIS — R059 Cough, unspecified: Secondary | ICD-10-CM | POA: Diagnosis present

## 2023-04-02 DIAGNOSIS — Z888 Allergy status to other drugs, medicaments and biological substances status: Secondary | ICD-10-CM

## 2023-04-02 LAB — CBC WITH DIFFERENTIAL/PLATELET
Abs Immature Granulocytes: 0.03 10*3/uL (ref 0.00–0.07)
Basophils Absolute: 0.1 10*3/uL (ref 0.0–0.1)
Basophils Relative: 1 %
Eosinophils Absolute: 0.1 10*3/uL (ref 0.0–0.5)
Eosinophils Relative: 2 %
HCT: 47.1 % — ABNORMAL HIGH (ref 36.0–46.0)
Hemoglobin: 14.7 g/dL (ref 12.0–15.0)
Immature Granulocytes: 0 %
Lymphocytes Relative: 13 %
Lymphs Abs: 1 10*3/uL (ref 0.7–4.0)
MCH: 27.7 pg (ref 26.0–34.0)
MCHC: 31.2 g/dL (ref 30.0–36.0)
MCV: 88.7 fL (ref 80.0–100.0)
Monocytes Absolute: 0.4 10*3/uL (ref 0.1–1.0)
Monocytes Relative: 5 %
Neutro Abs: 6 10*3/uL (ref 1.7–7.7)
Neutrophils Relative %: 79 %
Platelets: 229 10*3/uL (ref 150–400)
RBC: 5.31 MIL/uL — ABNORMAL HIGH (ref 3.87–5.11)
RDW: 14.6 % (ref 11.5–15.5)
WBC: 7.7 10*3/uL (ref 4.0–10.5)
nRBC: 0 % (ref 0.0–0.2)

## 2023-04-02 LAB — COMPREHENSIVE METABOLIC PANEL
ALT: 13 U/L (ref 0–44)
AST: 23 U/L (ref 15–41)
Albumin: 3.7 g/dL (ref 3.5–5.0)
Alkaline Phosphatase: 84 U/L (ref 38–126)
Anion gap: 14 (ref 5–15)
BUN: 17 mg/dL (ref 8–23)
CO2: 25 mmol/L (ref 22–32)
Calcium: 10 mg/dL (ref 8.9–10.3)
Chloride: 103 mmol/L (ref 98–111)
Creatinine, Ser: 0.84 mg/dL (ref 0.44–1.00)
GFR, Estimated: >60 mL/min (ref >60)
Glucose, Bld: 115 mg/dL — ABNORMAL HIGH (ref 70–99)
Potassium: 4.3 mmol/L (ref 3.5–5.1)
Sodium: 142 mmol/L (ref 135–145)
Total Bilirubin: 0.6 mg/dL (ref 0.3–1.2)
Total Protein: 6.8 g/dL (ref 6.5–8.1)

## 2023-04-02 LAB — PROTIME-INR
INR: 1.6 — ABNORMAL HIGH (ref 0.8–1.2)
Prothrombin Time: 18.8 seconds — ABNORMAL HIGH (ref 11.4–15.2)

## 2023-04-02 LAB — LIPASE, BLOOD: Lipase: 25 U/L (ref 11–51)

## 2023-04-02 LAB — SARS CORONAVIRUS 2 BY RT PCR: SARS Coronavirus 2 by RT PCR: NEGATIVE

## 2023-04-02 LAB — HEPARIN LEVEL (UNFRACTIONATED): Heparin Unfractionated: 0.25 IU/mL — ABNORMAL LOW (ref 0.30–0.70)

## 2023-04-02 LAB — MAGNESIUM: Magnesium: 2.1 mg/dL (ref 1.7–2.4)

## 2023-04-02 MED ORDER — HEPARIN (PORCINE) 25000 UT/250ML-% IV SOLN
700.0000 [IU]/h | INTRAVENOUS | Status: DC
  Administered 2023-04-02: 800 [IU]/h via INTRAVENOUS
  Filled 2023-04-02 (×2): qty 250

## 2023-04-02 MED ORDER — ONDANSETRON 4 MG PO TBDP
4.0000 mg | ORAL_TABLET | Freq: Once | ORAL | Status: AC
  Administered 2023-04-02: 4 mg via ORAL
  Filled 2023-04-02: qty 1

## 2023-04-02 MED ORDER — ALBUTEROL SULFATE (2.5 MG/3ML) 0.083% IN NEBU
2.5000 mg | INHALATION_SOLUTION | Freq: Four times a day (QID) | RESPIRATORY_TRACT | Status: DC | PRN
  Administered 2023-04-04 – 2023-04-05 (×2): 2.5 mg via RESPIRATORY_TRACT
  Filled 2023-04-02 (×3): qty 3

## 2023-04-02 MED ORDER — FENTANYL CITRATE PF 50 MCG/ML IJ SOSY
50.0000 ug | PREFILLED_SYRINGE | Freq: Once | INTRAMUSCULAR | Status: AC
  Administered 2023-04-02: 50 ug via INTRAVENOUS
  Filled 2023-04-02: qty 1

## 2023-04-02 MED ORDER — SODIUM CHLORIDE 0.9% FLUSH
3.0000 mL | Freq: Two times a day (BID) | INTRAVENOUS | Status: DC
  Administered 2023-04-02 – 2023-04-07 (×11): 3 mL via INTRAVENOUS

## 2023-04-02 MED ORDER — LACTATED RINGERS IV BOLUS
1000.0000 mL | Freq: Once | INTRAVENOUS | Status: AC
  Administered 2023-04-02: 1000 mL via INTRAVENOUS

## 2023-04-02 MED ORDER — ACETAMINOPHEN 325 MG PO TABS
650.0000 mg | ORAL_TABLET | Freq: Four times a day (QID) | ORAL | Status: DC | PRN
  Administered 2023-04-04 – 2023-04-08 (×8): 650 mg via ORAL
  Filled 2023-04-02 (×8): qty 2

## 2023-04-02 MED ORDER — SODIUM CHLORIDE 0.9 % IV SOLN: INTRAVENOUS | Status: DC

## 2023-04-02 MED ORDER — ONDANSETRON HCL 4 MG/2ML IJ SOLN: 4.0000 mg | Freq: Once | INTRAMUSCULAR | Status: DC

## 2023-04-02 MED ORDER — FENTANYL CITRATE PF 50 MCG/ML IJ SOSY: 50.0000 ug | PREFILLED_SYRINGE | Freq: Once | INTRAMUSCULAR | Status: DC

## 2023-04-02 MED ORDER — LORAZEPAM 2 MG/ML IJ SOLN
0.5000 mg | Freq: Four times a day (QID) | INTRAMUSCULAR | Status: DC | PRN
  Administered 2023-04-02 – 2023-04-04 (×2): 0.5 mg via INTRAVENOUS
  Filled 2023-04-02 (×3): qty 1

## 2023-04-02 MED ORDER — ONDANSETRON HCL 4 MG/2ML IJ SOLN
4.0000 mg | Freq: Four times a day (QID) | INTRAMUSCULAR | Status: DC | PRN
  Administered 2023-04-02 (×2): 4 mg via INTRAVENOUS
  Filled 2023-04-02 (×3): qty 2

## 2023-04-02 MED ORDER — ONDANSETRON HCL 4 MG PO TABS
4.0000 mg | ORAL_TABLET | Freq: Four times a day (QID) | ORAL | Status: DC | PRN
  Administered 2023-04-07: 4 mg via ORAL
  Filled 2023-04-02: qty 1

## 2023-04-02 MED ORDER — ACETAMINOPHEN 650 MG RE SUPP: 650.0000 mg | Freq: Four times a day (QID) | RECTAL | Status: DC | PRN

## 2023-04-02 MED ORDER — IOHEXOL 350 MG/ML SOLN
75.0000 mL | Freq: Once | INTRAVENOUS | Status: AC | PRN
  Administered 2023-04-02: 75 mL via INTRAVENOUS

## 2023-04-02 MED ORDER — KETOROLAC TROMETHAMINE 15 MG/ML IJ SOLN
15.0000 mg | Freq: Four times a day (QID) | INTRAMUSCULAR | Status: AC | PRN
  Administered 2023-04-02 – 2023-04-03 (×3): 15 mg via INTRAVENOUS
  Filled 2023-04-02 (×3): qty 1

## 2023-04-02 MED ORDER — KETOROLAC TROMETHAMINE 30 MG/ML IJ SOLN
30.0000 mg | Freq: Once | INTRAMUSCULAR | Status: AC
  Administered 2023-04-02: 30 mg via INTRAVENOUS
  Filled 2023-04-02: qty 1

## 2023-04-02 MED ORDER — DIATRIZOATE MEGLUMINE & SODIUM 66-10 % PO SOLN
90.0000 mL | Freq: Once | ORAL | Status: AC
  Administered 2023-04-03: 90 mL via NASOGASTRIC
  Filled 2023-04-02: qty 90

## 2023-04-02 MED ORDER — PANTOPRAZOLE SODIUM 40 MG IV SOLR
40.0000 mg | Freq: Every day | INTRAVENOUS | Status: DC
  Administered 2023-04-02 – 2023-04-06 (×5): 40 mg via INTRAVENOUS
  Filled 2023-04-02 (×5): qty 10

## 2023-04-02 NOTE — Progress Notes (Signed)
ANTICOAGULATION CONSULT NOTE - Initial Consult  Pharmacy Consult for transition from coumadin to heparin infusion Indication:  history of CVA w/ PFO  Allergies  Allergen Reactions   Dilaudid [Hydromorphone Hcl] Shortness Of Breath    Tolerates tramadol   Hydromorphone Other (See Comments)    UNKNOWN TO PATIENT   Morphine Other (See Comments)    UNKNOWN TO PATIENT   Morphine And Codeine Shortness Of Breath    Tolerates tramadol   Sulfamethoxazole Other (See Comments)    UNKNOWN TO PATIENT   Sulfa Antibiotics Other (See Comments)    "Made me drunk";  wobbly   Keflex [Cephalexin] Nausea Only   Levaquin [Levofloxacin] Hives and Rash    Patient Measurements: Weight: 65.2 kg (143 lb 11.8 oz) Heparin Dosing Weight: 65.2 Kg  Vital Signs: Temp: 99.3 F (37.4 C) (09/24 2033) Temp Source: Oral (09/24 2033) BP: 138/81 (09/24 2033) Pulse Rate: 86 (09/24 2033)  Labs: Recent Labs    04/01/23 1406 04/02/23 0530 04/02/23 1040 04/02/23 2141  HGB  --  14.7  --   --   HCT  --  47.1*  --   --   PLT  --  229  --   --   LABPROT  --   --  18.8*  --   INR 1.6*  --  1.6*  --   HEPARINUNFRC  --   --   --  0.25*  CREATININE  --  0.84  --   --     Estimated Creatinine Clearance: 43.6 mL/min (by C-G formula based on SCr of 0.84 mg/dL).   Medical History: Past Medical History:  Diagnosis Date   ASD (atrial septal defect)    Cancer (HCC)    breast, left   Carpal tunnel syndrome, bilateral    Depression    Gait disorder    GERD (gastroesophageal reflux disease)    Glaucoma    Glaucoma    History of stroke    Hypertension    IBS (irritable bowel syndrome)    Personal history of radiation therapy 1996   S/P bunionectomy    bilateral     Assessment: 37 YOF admitted w/ cc of abdominal pain. She has a history of CVA (1997 and 2004) in the setting of PFO. She is maintained on coumadin in the ambulatory care setting. Current INR is 1.6 (9/24 10:40). A consult is placed for a  heparin infusion  HL 0.25  - subtherapeutic  Goal of Therapy:  Heparin level 0.3-0.7 units/ml Monitor platelets by anticoagulation protocol: Yes   Plan:  Increase heparin infusion to 950 units/hr Check anti-Xa level in 8 hours and daily while on heparin Continue to monitor H&H and platelets  Calton Dach, PharmD, BCCCP Clinical Pharmacist 04/02/2023 11:46 PM

## 2023-04-02 NOTE — ED Triage Notes (Signed)
Belly pain that wraps around top of belly then radiates down to pelvic area that started tonight around 10pm. She was hoping it would get better but getting worse. Recent 5 teeth extractions last week. Having emesis x 2 and it is just water. No belly surgeries.

## 2023-04-02 NOTE — ED Notes (Signed)
Pt now sobbing.

## 2023-04-02 NOTE — Consult Note (Signed)
[facility] Surgery Consult Note  Renee Adams Feb 20, 87 1935  [id number].    Requesting MD: Alvester Chou Chief Complaint/Reason for Consult: SBO  HPI:  Renee Adams is a 89 y.o. female h/o prior CVA and ASD on coumadin who presented to the ED today for evaluation of abdominal pain. States that it started around 2200 last night. Pain is lower in her abdomen. Associated with nausea and vomiting. She alternates between diarrhea and constipation at baseline. She had a small BM in the ED today. No flatus. No prior h/o SBO. No prior h/o abdominal surgery.  CT scan showed several distal small bowel loops appear thick walled with surrounding edema/inflammation concerning for enteritis; the small bowel loops proximal to this are mildly prominent and fluid-filled suggesting the possibility of low grade small bowel obstruction. General surgery asked to see.   Family History  Problem Relation Age of Onset   Cancer Mother    Breast cancer Mother    Heart failure Father    Diabetes Sister    Osteoarthritis Brother    Breast cancer Maternal Aunt    Heart attack Neg Hx     Past Medical History:  Diagnosis Date   ASD (atrial septal defect)    Cancer (HCC)    breast, left   Carpal tunnel syndrome, bilateral    Depression    Gait disorder    GERD (gastroesophageal reflux disease)    Glaucoma    Glaucoma    History of stroke    Hypertension    IBS (irritable bowel syndrome)    Personal history of radiation therapy 1996   S/P bunionectomy    bilateral    Past Surgical History:  Procedure Laterality Date   ABDOMINAL HYSTERECTOMY     bladder resuspension     BREAST BIOPSY Left    BREAST BIOPSY Right    BREAST LUMPECTOMY Left 1996   CATARACT EXTRACTION Bilateral    INCISION AND DRAINAGE HIP  03/31/2012   Procedure: IRRIGATION AND DEBRIDEMENT HIP WITH POLY EXCHANGE;  Surgeon: Raymon Mutton, MD;  Location: [facility] OR;  Service: Orthopedics;  Laterality: Left;   ORIF FEMUR FRACTURE  Right 11/30/2012   Procedure: OPEN REDUCTION INTERNAL FIXATION (ORIF) DISTAL FEMUR FRACTURE;  Surgeon: Dannielle Huh, MD;  Location: [facility] OR;  Service: Orthopedics;  Laterality: Right;   TOTAL HIP ARTHROPLASTY Bilateral    TOTAL KNEE ARTHROPLASTY Bilateral     Social History:  reports that she has never smoked. She has never used smokeless tobacco. She reports that she does not drink alcohol and does not use drugs.  Allergies:  Allergies  Allergen Reactions   Dilaudid [Hydromorphone Hcl] Shortness Of Breath    Tolerates tramadol   Hydromorphone Other (See Comments)    UNKNOWN TO PATIENT   Morphine Other (See Comments)    UNKNOWN TO PATIENT   Morphine And Codeine Shortness Of Breath    Tolerates tramadol   Sulfamethoxazole Other (See Comments)    UNKNOWN TO PATIENT   Sulfa Antibiotics Other (See Comments)    "Made me drunk";  wobbly   Keflex [Cephalexin] Nausea Only   Levaquin [Levofloxacin] Hives and Rash    (Not in a hospital admission)   Prior to Admission medications   Medication Sig Start Date End Date Taking? Authorizing Provider  acetaminophen (TYLENOL) 500 MG tablet Take 1 tablet (500 mg total) by mouth every 8 (eight) hours as needed. Patient taking differently: Take 500 mg by mouth every 8 (eight) hours as needed for  moderate pain or headache. 02/07/21   Magrinat, Valentino Hue, MD  ALPRAZolam Prudy Feeler) 0.25 MG tablet Take 4 tablets (1 mg total) by mouth every 4 (four) hours as needed for anxiety. 12/17/12   Angiulli, Mcarthur Rossetti, PA-C  ketoconazole (NIZORAL) 2 % cream APPLY TO AFFECTED AREA ON THE SKIN DAILY AS NEEDED FOR IRRITATION 02/25/23   Rachel Moulds, MD  rOPINIRole (REQUIP) 1 MG tablet Take 1 tablet (1 mg total) by mouth at bedtime as needed. For restless legs Patient taking differently: Take 1 mg by mouth at bedtime as needed (restless legs). 12/17/12   Angiulli, Mcarthur Rossetti, PA-C  sertraline (ZOLOFT) 100 MG tablet Take 1.5 tablets (150 mg total) by mouth daily. Patient taking  differently: Take 100 mg by mouth daily. 12/17/12   Angiulli, Mcarthur Rossetti, PA-C  traMADol (ULTRAM) 50 MG tablet Take 50 mg by mouth 2 (two) times daily as needed for moderate pain. 07/12/21   [provider]  warfarin (COUMADIN) 2 MG tablet TAKE 1 TO 1 AND 1/2 TABLETS BY MOUTH DAILY AS DIRECTED BY THE COUMADIN CLINIC 02/20/23   Corky Crafts, MD  pramipexole (MIRAPEX) 0.125 MG tablet Take 0.125 mg by mouth 3 (three) times daily.  09/15/11  [provider]    Blood pressure (!) 152/84, pulse 73, temperature 97.9 F (36.6 C), temperature source Oral, resp. rate 18, SpO2 93%. Physical Exam: General: pleasant, elderly female who is laying in bed in NAD HEENT: head is normocephalic, atraumatic.  Sclera are noninjected.  Pupils equal and round.  Ears and nose without any masses or lesions.  Mouth is pink and moist. Dentition fair Heart: regular, rate, and rhythm Lungs: CTAB, no wheezes, rhonchi, or rales noted.  Respiratory effort nonlabored on room air Abd: soft, minimal distension, +BS, no masses, hernias, or organomegaly. Mild generalized TTP without rebound or guarding Skin: warm and dry with no masses, lesions, or rashes Neuro: MAEs, no gross motor or sensory deficits BUE/BLE  Results for orders placed or performed during the hospital encounter of 04/02/23 (from the past 48 hour(s))  Lipase, blood     Status: None   Collection Time: 04/02/23  5:30 AM  Result Value Ref Range   Lipase 25 11 - 51 U/L    Comment: Performed at [facility], [address], [place], [place] [address]  Comprehensive metabolic panel     Status: Abnormal   Collection Time: 04/02/23  5:30 AM  Result Value Ref Range   Sodium 142 135 - 145 mmol/L   Potassium 4.3 3.5 - 5.1 mmol/L   Chloride 103 98 - 111 mmol/L   CO2 25 22 - 32 mmol/L   Glucose, Bld 115 (H) 70 - 99 mg/dL    Comment: Glucose reference range applies only to samples taken after fasting for at least 8 hours.   BUN 17 8 - 23 mg/dL    Creatinine, Ser 2.84 0.44 - 1.00 mg/dL   Calcium 13.2 8.9 - 44.0 mg/dL   Total Protein 6.8 6.5 - 8.1 g/dL   Albumin 3.7 3.5 - 5.0 g/dL   AST 23 15 - 41 U/L   ALT 13 0 - 44 U/L   Alkaline Phosphatase 84 38 - 126 U/L   Total Bilirubin 0.6 0.3 - 1.2 mg/dL   GFR, Estimated >10 >27 mL/min    Comment: (NOTE) Calculated using the CKD-EPI Creatinine Equation (2021)    Anion gap 14 5 - 15    Comment: Performed at [facility],  [address], [place], [place] [address]  CBC with Differential     Status: Abnormal   Collection Time: 04/02/23  5:30 AM  Result Value Ref Range   WBC 7.7 4.0 - 10.5 K/uL   RBC 5.31 (H) 3.87 - 5.11 MIL/uL   Hemoglobin 14.7 12.0 - 15.0 g/dL   HCT 32.9 (H) 51.8 - 84.1 %   MCV 88.7 80.0 - 100.0 fL   MCH 27.7 26.0 - 34.0 pg   MCHC 31.2 30.0 - 36.0 g/dL   RDW 66.0 63.0 - 16.0 %   Platelets 229 150 - 400 K/uL   nRBC 0.0 0.0 - 0.2 %   Neutrophils Relative % 79 %   Neutro Abs 6.0 1.7 - 7.7 K/uL   Lymphocytes Relative 13 %   Lymphs Abs 1.0 0.7 - 4.0 K/uL   Monocytes Relative 5 %   Monocytes Absolute 0.4 0.1 - 1.0 K/uL   Eosinophils Relative 2 %   Eosinophils Absolute 0.1 0.0 - 0.5 K/uL   Basophils Relative 1 %   Basophils Absolute 0.1 0.0 - 0.1 K/uL   Immature Granulocytes 0 %   Abs Immature Granulocytes 0.03 0.00 - 0.07 K/uL    Comment: Performed at [facility], [address], [place], [place] [address]  Magnesium     Status: None   Collection Time: 04/02/23 10:40 AM  Result Value Ref Range   Magnesium 2.1 1.7 - 2.4 mg/dL    Comment: Performed at [facility], [address] [place], [place] [address]  Protime-INR     Status: Abnormal   Collection Time: 04/02/23 10:40 AM  Result Value Ref Range   Prothrombin Time 18.8 (H) 11.4 - 15.2 seconds   INR 1.6 (H) 0.8 - 1.2    Comment: (NOTE) INR goal varies based on device and disease states. Performed at [facility], [address], [place], [place] [address]    CT ABDOMEN  PELVIS W CONTRAST  Result Date: 04/02/2023 CLINICAL DATA:  Nausea, vomiting EXAM: CT ABDOMEN AND PELVIS WITH CONTRAST TECHNIQUE: Multidetector CT imaging of the abdomen and pelvis was performed using the standard protocol following bolus administration of intravenous contrast. RADIATION DOSE REDUCTION: This exam was performed according to the departmental dose-optimization program which includes automated exposure control, adjustment of the mA and/or kV according to patient size and/or use of iterative reconstruction technique. CONTRAST:  75mL OMNIPAQUE IOHEXOL 350 MG/ML SOLN COMPARISON:  None Available. FINDINGS: Lower chest: Linear scarring in the lung bases. No effusions. Dense mitral valve annular calcifications. Hepatobiliary: No focal hepatic abnormality. Gallbladder unremarkable. Pancreas: No focal abnormality or ductal dilatation. Spleen: No focal abnormality.  Normal size. Adrenals/Urinary Tract: No adrenal abnormality. No suspicious focal renal abnormality. No stones or hydronephrosis. Urinary bladder obscured by beam hardening artifact from bilateral hip replacements. Stomach/Bowel: Left colonic diverticulosis. No active diverticulitis. Abnormal appearing distal small bowel loops with thick walls and surrounding edema. Findings concerning for enteritis. Proximal to these loops, the small bowel is mildly prominent and fluid-filled suggesting the possibility of low grade small bowel obstruction. Vascular/Lymphatic: Aortoiliac atherosclerosis. No evidence of aneurysm or adenopathy. Reproductive: Obscured by beam hardening artifact from bilateral hip replacements. Other: Small amount of free fluid in the pelvis. No free air. Small umbilical hernia containing fat. Musculoskeletal: No acute bony abnormality. IMPRESSION: Several distal small bowel loops appear thick walled with surrounding edema/inflammation concerning for enteritis. The small bowel loops proximal to this are mildly prominent and fluid-filled  suggesting the  possibility of low grade small bowel obstruction. Left colonic diverticulosis. Aortic atherosclerosis. Electronically Signed   By: Charlett Nose M.D.   On: 04/02/2023 09:37      Assessment/Plan SBO vs enteritis  - No prior h/o abdominal surgery - CT scan today shows several thick walled distal small bowel loops with surrounding edema/inflammation concerning for enteritis; the small bowel loops proximal to this are mildly prominent and fluid-filled suggesting the possibility of low grade small bowel obstruction - No indication for acute surgical intervention. Suspect this is more of an enteritis than a true SBO. Recommend NG tube decompression. Will start patient on SBO protocol with gastrograffin. We will follow.  ID - none VTE - hold coumadin, ok for heparin gtt if needed FEN - IVF, NPO Foley - none  Hx CVA, ASD on coumadin HTN HLD Glaucoma IBS  I reviewed ED provider notes, last 24 h vitals and pain scores, last 48 h intake and output, last 24 h labs and trends, and last 24 h imaging results.   [name], PA-C [facility] Surgery 04/02/2023, 12:23 PM Please see Amion for pager number during day hours 7:00am-4:30pm

## 2023-04-02 NOTE — ED Provider Notes (Signed)
[place] EMERGENCY DEPARTMENT AT [facility] Provider Note   CSN: [id number] Arrival date & time: 04/02/23  4098     History  Chief Complaint  Patient presents with   Abdominal Pain    Renee Adams is a 89 y.o. female.   Abdominal Pain Associated symptoms: constipation, nausea and vomiting      89 year old female with medical history significant for GERD, HTN, depression, IBS presenting to the emergency department with abdominal pain.  The patient states that she is having belly pain that radiates across the top of her abdomen and down to her pelvis.  Symptoms started around 10 PM last night.  She states that she is not passing gas.  She had 2 episodes of NBNB emesis with dry heaving now.  No fevers or chills.  She did have a large and somewhat painful bowel movement right after being roomed in the emergency department but still is endorsing 9 out of 10 pain and is still not passing gas.  Home Medications Prior to Admission medications   Medication Sig Start Date End Date Taking? Authorizing Provider  acetaminophen (TYLENOL) 500 MG tablet Take 1 tablet (500 mg total) by mouth every 8 (eight) hours as needed. Patient taking differently: Take 500 mg by mouth every 8 (eight) hours as needed for moderate pain or headache. 02/07/21   Magrinat, Valentino Hue, MD  ALPRAZolam Prudy Feeler) 0.25 MG tablet Take 4 tablets (1 mg total) by mouth every 4 (four) hours as needed for anxiety. 12/17/12   Angiulli, Mcarthur Rossetti, PA-C  ketoconazole (NIZORAL) 2 % cream APPLY TO AFFECTED AREA ON THE SKIN DAILY AS NEEDED FOR IRRITATION 02/25/23   Rachel Moulds, MD  rOPINIRole (REQUIP) 1 MG tablet Take 1 tablet (1 mg total) by mouth at bedtime as needed. For restless legs Patient taking differently: Take 1 mg by mouth at bedtime as needed (restless legs). 12/17/12   Angiulli, Mcarthur Rossetti, PA-C  sertraline (ZOLOFT) 100 MG tablet Take 1.5 tablets (150 mg total) by mouth daily. Patient taking differently: Take 100  mg by mouth daily. 12/17/12   Angiulli, Mcarthur Rossetti, PA-C  traMADol (ULTRAM) 50 MG tablet Take 50 mg by mouth 2 (two) times daily as needed for moderate pain. 07/12/21   [provider]  warfarin (COUMADIN) 2 MG tablet TAKE 1 TO 1 AND 1/2 TABLETS BY MOUTH DAILY AS DIRECTED BY THE COUMADIN CLINIC 02/20/23   Corky Crafts, MD  pramipexole (MIRAPEX) 0.125 MG tablet Take 0.125 mg by mouth 3 (three) times daily.  09/15/11  [provider]      Allergies    Dilaudid [hydromorphone hcl], Hydromorphone, Morphine, Morphine and codeine, Sulfamethoxazole, Sulfa antibiotics, Keflex [cephalexin], and Levaquin [levofloxacin]    Review of Systems   Review of Systems  Gastrointestinal:  Positive for abdominal pain, constipation, nausea and vomiting.  All other systems reviewed and are negative.   Physical Exam Updated Vital Signs BP (!) 145/84 (BP Location: Right Arm)   Pulse 81   Temp 99 F (37.2 C) (Oral)   Resp 18   SpO2 90%  Physical Exam Vitals and nursing note reviewed.  Constitutional:      General: She is not in acute distress.    Appearance: She is well-developed.  HENT:     Head: Normocephalic and atraumatic.  Eyes:     Conjunctiva/sclera: Conjunctivae normal.  Cardiovascular:     Rate and Rhythm: Normal rate and regular rhythm.     Heart sounds: No murmur heard.  Pulmonary:     Effort: Pulmonary effort is normal. No respiratory distress.     Breath sounds: Normal breath sounds.  Abdominal:     Palpations: Abdomen is soft.     Tenderness: There is generalized abdominal tenderness. There is no guarding or rebound.  Musculoskeletal:        General: No swelling.     Cervical back: Neck supple.  Skin:    General: Skin is warm and dry.     Capillary Refill: Capillary refill takes less than 2 seconds.  Neurological:     Mental Status: She is alert.  Psychiatric:        Mood and Affect: Mood normal.     ED Results / Procedures / Treatments   Labs (all labs  ordered are listed, but only abnormal results are displayed) Labs Reviewed  COMPREHENSIVE METABOLIC PANEL - Abnormal; Notable for the following components:      Result Value   Glucose, Bld 115 (*)    All other components within normal limits  CBC WITH DIFFERENTIAL/PLATELET - Abnormal; Notable for the following components:   RBC 5.31 (*)    HCT 47.1 (*)    All other components within normal limits  LIPASE, BLOOD  URINALYSIS, ROUTINE W REFLEX MICROSCOPIC    EKG None  Radiology No results found.  Procedures Procedures    Medications Ordered in ED Medications  ondansetron (ZOFRAN-ODT) disintegrating tablet 4 mg (4 mg Oral Given 04/02/23 0540)  lactated ringers bolus 1,000 mL (1,000 mLs Intravenous New Bag/Given 04/02/23 0650)  fentaNYL (SUBLIMAZE) injection 50 mcg (50 mcg Intravenous Given 04/02/23 1610)    ED Course/ Medical Decision Making/ A&P                                 Medical Decision Making Amount and/or Complexity of Data Reviewed Labs: ordered. Radiology: ordered.  Risk Prescription drug management.    89 year old female with medical history significant for GERD, HTN, depression, IBS presenting to the emergency department with abdominal pain.  The patient states that she is having belly pain that radiates across the top of her abdomen and down to her pelvis.  Symptoms started around 10 PM last night.  She states that she is not passing gas.  She had 2 episodes of NBNB emesis with dry heaving now.  No fevers or chills.  She did have a large and somewhat painful bowel movement right after being roomed in the emergency department but still is endorsing 9 out of 10 pain and is still not passing gas.   On arrival, the patient was afebrile, not tachycardic or tachypneic, BP 145/84, saturating well on room air on my evaluation.  The patient presents with generalized abdominal discomfort, retching and dry heaving in the setting of painful bowel movements.  Differential  diagnose includes constipation versus small bowel obstruction.  Considered diverticulitis, appendicitis.  IV access was obtained and the patient was administered an IV fluid bolus in addition to IV fentanyl for pain control.  She was administered oral Zofran in triage.  A laboratory evaluation revealed a CBC without a leukocytosis or anemia, lipase normal, CMP unremarkable, urinalysis ordered and pending.  For CT abdomen/pelvis to evaluate for possible bowel obstruction, signout given to Dr. Renaye Rakers pending CT results, ultimate disposition pending results of testing and reassessment.     Final Clinical Impression(s) / ED Diagnoses Final diagnoses:  Generalized abdominal pain    Rx /  DC Orders ED Discharge Orders     None         Ernie Avena, MD 04/02/23 [phone]

## 2023-04-02 NOTE — H&P (Addendum)
History and Physical    Patient: Renee Adams:[id number] DOB: Jul 31, 1933 DOA: 04/02/2023 DOS: the patient was seen and examined on 04/02/2023 PCP: Irven Coe, MD  Patient coming from: Home  Chief Complaint:  Chief Complaint  Patient presents with   Abdominal Pain   HPI: Renee Adams is a 89 y.o. female with medical history significant of hypertension, PAF on coumadin, CVA, possible PFO, left breast cancer s/p lumpectomy in 1997 in remission, and GERD who presented with complaints of abdominal pain starting yesterday evening.  She reports having pain around her waist and lower abdomen that was crampy in nature.  Associated symptoms included nausea and dry heaves.  Patient denies any prior history of small bowel obstruction and has not had any abdominal surgeries.  Patient had just recently had extraction of her upper molar teeth last week and had been on a soft diet.  In the emergency department patient was noted to be afebrile with blood pressures 152/84, and all other vital signs maintained.  Labs noted INR 1.6.CT scan of the abdomen pelvis noted several distal small loops thick-walled with surrounding edema/inflammation concerning for enteritis and possible low grade small bowel obstruction.  Patient having given emetics, ketorolac, and fentanyl.  While in the emergency department patient was noted to have a small bowel movement.  General surgery have been consulted to evaluate.  NG tube was recommended.  Patient had been given 1 L of lactated Ringer's, antiemetics, and fentanyl IV for pain.  Review of Systems: As mentioned in the history of present illness. All other systems reviewed and are negative. Past Medical History:  Diagnosis Date   ASD (atrial septal defect)    Cancer (HCC)    breast, left   Carpal tunnel syndrome, bilateral    Depression    Gait disorder    GERD (gastroesophageal reflux disease)    Glaucoma    Glaucoma    History of stroke    Hypertension    IBS  (irritable bowel syndrome)    Personal history of radiation therapy 1996   S/P bunionectomy    bilateral   Past Surgical History:  Procedure Laterality Date   ABDOMINAL HYSTERECTOMY     bladder resuspension     BREAST BIOPSY Left    BREAST BIOPSY Right    BREAST LUMPECTOMY Left 1996   CATARACT EXTRACTION Bilateral    INCISION AND DRAINAGE HIP  03/31/2012   Procedure: IRRIGATION AND DEBRIDEMENT HIP WITH POLY EXCHANGE;  Surgeon: Raymon Mutton, MD;  Location: [facility] OR;  Service: Orthopedics;  Laterality: Left;   ORIF FEMUR FRACTURE Right 11/30/2012   Procedure: OPEN REDUCTION INTERNAL FIXATION (ORIF) DISTAL FEMUR FRACTURE;  Surgeon: Dannielle Huh, MD;  Location: [facility] OR;  Service: Orthopedics;  Laterality: Right;   TOTAL HIP ARTHROPLASTY Bilateral    TOTAL KNEE ARTHROPLASTY Bilateral    Social History:  reports that she has never smoked. She has never used smokeless tobacco. She reports that she does not drink alcohol and does not use drugs.  Allergies  Allergen Reactions   Dilaudid [Hydromorphone Hcl] Shortness Of Breath    Tolerates tramadol   Hydromorphone Other (See Comments)    UNKNOWN TO PATIENT   Morphine Other (See Comments)    UNKNOWN TO PATIENT   Morphine And Codeine Shortness Of Breath    Tolerates tramadol   Sulfamethoxazole Other (See Comments)    UNKNOWN TO PATIENT   Sulfa Antibiotics Other (See Comments)    "Made me drunk";  wobbly  Keflex [Cephalexin] Nausea Only   Levaquin [Levofloxacin] Hives and Rash    Family History  Problem Relation Age of Onset   Cancer Mother    Breast cancer Mother    Heart failure Father    Diabetes Sister    Osteoarthritis Brother    Breast cancer Maternal Aunt    Heart attack Neg Hx     Prior to Admission medications   Medication Sig Start Date End Date Taking? Authorizing Provider  acetaminophen (TYLENOL) 500 MG tablet Take 1 tablet (500 mg total) by mouth every 8 (eight) hours as needed. Patient taking differently: Take 500  mg by mouth every 8 (eight) hours as needed for moderate pain or headache. 02/07/21   Magrinat, Valentino Hue, MD  ALPRAZolam Prudy Feeler) 0.25 MG tablet Take 4 tablets (1 mg total) by mouth every 4 (four) hours as needed for anxiety. 12/17/12   Angiulli, Mcarthur Rossetti, PA-C  ketoconazole (NIZORAL) 2 % cream APPLY TO AFFECTED AREA ON THE SKIN DAILY AS NEEDED FOR IRRITATION 02/25/23   Rachel Moulds, MD  rOPINIRole (REQUIP) 1 MG tablet Take 1 tablet (1 mg total) by mouth at bedtime as needed. For restless legs Patient taking differently: Take 1 mg by mouth at bedtime as needed (restless legs). 12/17/12   Angiulli, Mcarthur Rossetti, PA-C  sertraline (ZOLOFT) 100 MG tablet Take 1.5 tablets (150 mg total) by mouth daily. Patient taking differently: Take 100 mg by mouth daily. 12/17/12   Angiulli, Mcarthur Rossetti, PA-C  traMADol (ULTRAM) 50 MG tablet Take 50 mg by mouth 2 (two) times daily as needed for moderate pain. 07/12/21   [provider]  warfarin (COUMADIN) 2 MG tablet TAKE 1 TO 1 AND 1/2 TABLETS BY MOUTH DAILY AS DIRECTED BY THE COUMADIN CLINIC 02/20/23   Corky Crafts, MD  pramipexole (MIRAPEX) 0.125 MG tablet Take 0.125 mg by mouth 3 (three) times daily.  09/15/11  [provider]    Physical Exam: Vitals:   04/02/23 0516 04/02/23 0745  BP: (!) 145/84 (!) 152/84  Pulse: 81 85  Resp: 18 18  Temp: 99 F (37.2 C) 97.9 F (36.6 C)  TempSrc: Oral Oral  SpO2: 90% 94%    Constitutional: Elderly female currently in no acute distress belching Eyes: PERRL, lids and conjunctivae normal ENMT: Mucous membranes are moist.  Patient has missing teeth from recent extraction.  Very hard of hearing. Neck: normal, supple Respiratory: clear to auscultation bilaterally, no wheezing, no crackles.  Currently on 2 L nasal cannula oxygen with O2 saturations maintained. Cardiovascular: Regular rate and rhythm, no murmurs / rubs / gallops. No extremity edema.  Abdomen:  Mild distention appreciated with bowel sounds present,  but mildly decreased. Musculoskeletal: no clubbing / cyanosis. No joint deformity upper and lower extremities. contractures noted of the right hand.   Skin: no rashes, lesions, ulcers. No induration Neurologic: CN 2-12 grossly intact.   Strength 5/5 in all 4.  Psychiatric: Normal judgment and insight. Alert and oriented x 3. Normal mood.   Data Reviewed:  EKG reveals sinus rhythm 81 bpm with left ventricular hypertrophy and QTc 478.  Reviewed labs, imaging, and pertinent records as documented.  Assessment and Plan:  Enteritis Possible partial small bowel obstruction Patient presented with complaints of lower abdominal pain starting yesterday evening.  CT imaging noted thick-walled distal small bowel bowel loops with surrounding edema/inflammation concerning for enteritis possibility of a low-grade small bowel obstruction.. -Admit to medical telemetry bed -Strict I&Os -N.p.o. -Continue NGT to suction -Normal saline  IV fluids -Toradol IV as needed for pain -Antiemetics as needed -Appreciate general surgery consultative services,   will follow-up for any further recommendations.  Paroxysmal atrial fibrillation on chronic anticoagulation History of CVA History of PFO  She appears to be in sinus rhythm at this time.   INR was noted to be subtherapeutic at 1.6.  CHA2DS2-VASc score is equal to at least 5 based off age, sex, and history of stroke. -Hold Coumadin -Heparin drip per pharmacy.  History of breast cancer Patient status post left lumpectomy back in 1997 and has been in remission.  Anxiety Patient home medication regimen includes Xanax 1 mg as needed for anxiety and Zoloft. -Ativan IV as needed for anxiety  Recent tooth extraction Patient just recently had multiple upper molars extracted in preparation for dentures last week.  Patient was supposed to be on amoxicillin. -Need to monitor and placed on alternative     GI prophylaxis: Protonix DVT prophylaxis:  Heparin Advance Care Planning:   Code Status: Full Code   Consults: General Surgery Family Communication: Son updated at bedside  Severity of Illness: The appropriate patient status for this patient is INPATIENT. Inpatient status is judged to be reasonable and necessary in order to provide the required intensity of service to ensure the patient's safety. The patient's presenting symptoms, physical exam findings, and initial radiographic and laboratory data in the context of their chronic comorbidities is felt to place them at high risk for further clinical deterioration. Furthermore, it is not anticipated that the patient will be medically stable for discharge from the hospital within 2 midnights of admission.   * I certify that at the point of admission it is my clinical judgment that the patient will require inpatient hospital care spanning beyond 2 midnights from the point of admission due to high intensity of service, high risk for further deterioration and high frequency of surveillance required.*  Author: Clydie Braun, MD 04/02/2023 10:41 AM  For on call review [email or web address].

## 2023-04-02 NOTE — ED Notes (Signed)
Pt had large BM unable to collect urine due to contamination

## 2023-04-02 NOTE — Plan of Care (Addendum)
1300: Pt admitted to 2 west, pt alert and oriented x4, pt son at bedside. Pt had a incontinent episode, RN and NA cleaned pt. Pt on 2L [place]. Pt vitals taken, pt placed on telemetry. Pt seen by general surgery. Orders for NG tube placed  1430: RN able to place NG tube in right nare, pt became very anxious afterwards, RN gave IV PRN ativan pt able to rest. NG tube at 56cm. Abdominal xray taken to confirm NG tube placement. Pt started on heparin gtt and IV fluids, second IV placed by IV team.     1730 RN waited several hours for xray to be read, RN called radiology. Radiology to speak with radiology MD.  [phone]: RN noticed xray finally read, Tube in correct placement. RN planning to hook pt to intermittent wall suction and give gastrografin but RN was notified by pt son the NG tube was out and laying on chest, pt son and pt unsure of how that happened. Accidental removal. Pt resting in bed RN notified Dr.Smith, RN to notify nightshift it would be best to place another tube close to when pt allowed to have IV ativan again for anxiety. MD aware.   Problem: Education: Goal: Knowledge of General Education information will improve Description: Including pain rating scale, medication(s)/side effects and non-pharmacologic comfort measures Outcome: Progressing   Problem: Health Behavior/Discharge Planning: Goal: Ability to manage health-related needs will improve Outcome: Progressing   Problem: Clinical Measurements: Goal: Ability to maintain clinical measurements within normal limits will improve Outcome: Progressing Goal: Will remain free from infection Outcome: Progressing Goal: Diagnostic test results will improve Outcome: Progressing Goal: Respiratory complications will improve Outcome: Progressing Goal: Cardiovascular complication will be avoided Outcome: Progressing   Problem: Activity: Goal: Risk for activity intolerance will decrease Outcome: Progressing   Problem: Nutrition: Goal:  Adequate nutrition will be maintained Outcome: Progressing   Problem: Coping: Goal: Level of anxiety will decrease Outcome: Progressing   Problem: Elimination: Goal: Will not experience complications related to bowel motility Outcome: Progressing Goal: Will not experience complications related to urinary retention Outcome: Progressing   Problem: Pain Managment: Goal: General experience of comfort will improve Outcome: Progressing   Problem: Safety: Goal: Ability to remain free from injury will improve Outcome: Progressing   Problem: Skin Integrity: Goal: Risk for impaired skin integrity will decrease Outcome: Progressing

## 2023-04-02 NOTE — ED Provider Notes (Signed)
89 yo female here with abdominal pain onset last night Constipated but had BM today, hard, in ED Not passing gas Vomiting yesterday   Labs unremarkable CT abdomen pelvis pending  Physical Exam  BP (!) 145/84 (BP Location: Right Arm)   Pulse 81   Temp 99 F (37.2 C) (Oral)   Resp 18   SpO2 90%   Physical Exam  Procedures  Procedures  ED Course / MDM   Clinical Course as of 04/02/23 1215  Tue Apr 02, 2023  1042 Admitted to Dr Katrinka Blazing hospitalist [MT]    Clinical Course User Index [MT] Renaye Rakers Kermit Balo, MD   Medical Decision Making Amount and/or Complexity of Data Reviewed Labs: ordered. Radiology: ordered.  Risk Prescription drug management. Decision regarding hospitalization.   Patient CT imaging is concerning for enteritis with potential partial small bowel obstruction.  He continues to have nausea in the ED.  He continues to have pain, although it is improved with Toradol.  At this point I spoke with the patient and husband medical admission for suspected enteritis and ileus.  We are attempting to minimize her opioid narcotics, and I discussed this again as a cause of ileus, but if she has severe pain she may benefit from additional small dose of the fentanyl.  She has severe adverse reactions to morphine and Dilaudid but tolerates fentanyl okay.  She did have a small bowel movement earlier today that was forceful but has not passed gas since, and I am not certain at this truly indicates improvement of her ileus.  Given her age and comorbidities I think medical admission is reasonable.  Her potassium is within normal limits.  We will check a magnesium level.  She does not have prior history of any abdominal surgeries, and have a lower suspicion for adhesions as a cause of bowel obstruction.  I suspect this more likely of some viral etiology.  I do not see an indication for IV antibiotics at this time.  I will discuss the case with general surgery but I am anticipating a  medical hospitalist admission.   Surgery PA [facility] consulted by phone and suggested there may be a role beneficial for an NG tube if the patient is he did have belching and appears to have gastric distention on her CT imaging.      Terald Sleeper, MD 04/02/23 [phone]

## 2023-04-02 NOTE — Progress Notes (Addendum)
ANTICOAGULATION CONSULT NOTE - Initial Consult  Pharmacy Consult for transition from coumadin to heparin infusion Indication:  history of CVA w/ PFO  Allergies  Allergen Reactions   Dilaudid [Hydromorphone Hcl] Shortness Of Breath    Tolerates tramadol   Hydromorphone Other (See Comments)    UNKNOWN TO PATIENT   Morphine Other (See Comments)    UNKNOWN TO PATIENT   Morphine And Codeine Shortness Of Breath    Tolerates tramadol   Sulfamethoxazole Other (See Comments)    UNKNOWN TO PATIENT   Sulfa Antibiotics Other (See Comments)    "Made me drunk";  wobbly   Keflex [Cephalexin] Nausea Only   Levaquin [Levofloxacin] Hives and Rash    Patient Measurements: Weight: 65.2 kg (143 lb 11.8 oz) Heparin Dosing Weight: 65.2 Kg  Vital Signs: Temp: 98.2 F (36.8 C) (09/24 1253) Temp Source: Oral (09/24 1253) BP: 127/78 (09/24 1253) Pulse Rate: 77 (09/24 1253)  Labs: Recent Labs    04/01/23 1406 04/02/23 0530 04/02/23 1040  HGB  --  14.7  --   HCT  --  47.1*  --   PLT  --  229  --   LABPROT  --   --  18.8*  INR 1.6*  --  1.6*  CREATININE  --  0.84  --     CrCl cannot be calculated (Unknown ideal weight.).   Medical History: Past Medical History:  Diagnosis Date   ASD (atrial septal defect)    Cancer (HCC)    breast, left   Carpal tunnel syndrome, bilateral    Depression    Gait disorder    GERD (gastroesophageal reflux disease)    Glaucoma    Glaucoma    History of stroke    Hypertension    IBS (irritable bowel syndrome)    Personal history of radiation therapy 1996   S/P bunionectomy    bilateral    Medications:  Medications Prior to Admission  Medication Sig Dispense Refill Last Dose   acetaminophen (TYLENOL) 500 MG tablet Take 1 tablet (500 mg total) by mouth every 8 (eight) hours as needed. (Patient taking differently: Take 500 mg by mouth every 8 (eight) hours as needed for moderate pain or headache.) 90 tablet 12    ALPRAZolam (XANAX) 0.25 MG tablet  Take 4 tablets (1 mg total) by mouth every 4 (four) hours as needed for anxiety. 40 tablet 0    ketoconazole (NIZORAL) 2 % cream APPLY TO AFFECTED AREA ON THE SKIN DAILY AS NEEDED FOR IRRITATION 15 g 0    rOPINIRole (REQUIP) 1 MG tablet Take 1 tablet (1 mg total) by mouth at bedtime as needed. For restless legs (Patient taking differently: Take 1 mg by mouth at bedtime as needed (restless legs).) 30 tablet 0    sertraline (ZOLOFT) 100 MG tablet Take 1.5 tablets (150 mg total) by mouth daily. (Patient taking differently: Take 100 mg by mouth daily.) 30 tablet 1    traMADol (ULTRAM) 50 MG tablet Take 50 mg by mouth 2 (two) times daily as needed for moderate pain.      warfarin (COUMADIN) 2 MG tablet TAKE 1 TO 1 AND 1/2 TABLETS BY MOUTH DAILY AS DIRECTED BY THE COUMADIN CLINIC 120 tablet 1    Scheduled:   diatrizoate meglumine-sodium  90 mL Per NG tube Once   fentaNYL (SUBLIMAZE) injection  50 mcg Intravenous Once   sodium chloride flush  3 mL Intravenous Q12H    Assessment: 88 YOF admitted w/ cc of abdominal  pain. She has a history of CVA (1997 and 2004) in the setting of PFO. She is maintained on coumadin in the ambulatory care setting. Current INR is 1.6 (9/24 10:40) A consult is placed for a heparin infusion  Goal of Therapy:  Heparin level 0.3-0.7 units/ml Monitor platelets by anticoagulation protocol: Yes   Plan:  Start heparin infusion at 800 units/hr Check anti-Xa level in 8 hours and daily while on heparin Continue to monitor H&H and platelets  Brynlee Pennywell BS, PharmD, BCPS Clinical Pharmacist 04/02/2023 2:19 PM  Contact: [phone] after 3 PM  "Be curious, not judgmental..." -Debbora Dus

## 2023-04-03 ENCOUNTER — Observation Stay (HOSPITAL_COMMUNITY): Payer: Medicare Other

## 2023-04-03 DIAGNOSIS — I10 Essential (primary) hypertension: Secondary | ICD-10-CM | POA: Diagnosis present

## 2023-04-03 DIAGNOSIS — Z96653 Presence of artificial knee joint, bilateral: Secondary | ICD-10-CM | POA: Diagnosis present

## 2023-04-03 DIAGNOSIS — K5669 Other partial intestinal obstruction: Secondary | ICD-10-CM | POA: Diagnosis not present

## 2023-04-03 DIAGNOSIS — Z79899 Other long term (current) drug therapy: Secondary | ICD-10-CM | POA: Diagnosis not present

## 2023-04-03 DIAGNOSIS — I480 Paroxysmal atrial fibrillation: Secondary | ICD-10-CM | POA: Diagnosis present

## 2023-04-03 DIAGNOSIS — E785 Hyperlipidemia, unspecified: Secondary | ICD-10-CM | POA: Diagnosis present

## 2023-04-03 DIAGNOSIS — Z923 Personal history of irradiation: Secondary | ICD-10-CM | POA: Diagnosis not present

## 2023-04-03 DIAGNOSIS — Z885 Allergy status to narcotic agent status: Secondary | ICD-10-CM | POA: Diagnosis not present

## 2023-04-03 DIAGNOSIS — H9202 Otalgia, left ear: Secondary | ICD-10-CM | POA: Diagnosis present

## 2023-04-03 DIAGNOSIS — G2581 Restless legs syndrome: Secondary | ICD-10-CM | POA: Diagnosis present

## 2023-04-03 DIAGNOSIS — Z8673 Personal history of transient ischemic attack (TIA), and cerebral infarction without residual deficits: Secondary | ICD-10-CM | POA: Diagnosis not present

## 2023-04-03 DIAGNOSIS — R0602 Shortness of breath: Secondary | ICD-10-CM | POA: Diagnosis present

## 2023-04-03 DIAGNOSIS — Z888 Allergy status to other drugs, medicaments and biological substances status: Secondary | ICD-10-CM | POA: Diagnosis not present

## 2023-04-03 DIAGNOSIS — I771 Stricture of artery: Secondary | ICD-10-CM | POA: Diagnosis not present

## 2023-04-03 DIAGNOSIS — Z4682 Encounter for fitting and adjustment of non-vascular catheter: Secondary | ICD-10-CM | POA: Diagnosis not present

## 2023-04-03 DIAGNOSIS — R918 Other nonspecific abnormal finding of lung field: Secondary | ICD-10-CM | POA: Diagnosis not present

## 2023-04-03 DIAGNOSIS — H409 Unspecified glaucoma: Secondary | ICD-10-CM | POA: Diagnosis present

## 2023-04-03 DIAGNOSIS — B9620 Unspecified Escherichia coli [E. coli] as the cause of diseases classified elsewhere: Secondary | ICD-10-CM | POA: Diagnosis present

## 2023-04-03 DIAGNOSIS — K219 Gastro-esophageal reflux disease without esophagitis: Secondary | ICD-10-CM | POA: Diagnosis not present

## 2023-04-03 DIAGNOSIS — Z853 Personal history of malignant neoplasm of breast: Secondary | ICD-10-CM | POA: Diagnosis not present

## 2023-04-03 DIAGNOSIS — Z7901 Long term (current) use of anticoagulants: Secondary | ICD-10-CM | POA: Diagnosis not present

## 2023-04-03 DIAGNOSIS — R059 Cough, unspecified: Secondary | ICD-10-CM | POA: Diagnosis present

## 2023-04-03 DIAGNOSIS — K529 Noninfective gastroenteritis and colitis, unspecified: Secondary | ICD-10-CM | POA: Diagnosis present

## 2023-04-03 DIAGNOSIS — Z881 Allergy status to other antibiotic agents status: Secondary | ICD-10-CM | POA: Diagnosis not present

## 2023-04-03 DIAGNOSIS — Q2112 Patent foramen ovale: Secondary | ICD-10-CM | POA: Diagnosis not present

## 2023-04-03 DIAGNOSIS — R140 Abdominal distension (gaseous): Secondary | ICD-10-CM | POA: Diagnosis not present

## 2023-04-03 DIAGNOSIS — F419 Anxiety disorder, unspecified: Secondary | ICD-10-CM | POA: Diagnosis present

## 2023-04-03 DIAGNOSIS — R109 Unspecified abdominal pain: Secondary | ICD-10-CM | POA: Diagnosis not present

## 2023-04-03 DIAGNOSIS — N390 Urinary tract infection, site not specified: Secondary | ICD-10-CM | POA: Diagnosis present

## 2023-04-03 DIAGNOSIS — K56609 Unspecified intestinal obstruction, unspecified as to partial versus complete obstruction: Secondary | ICD-10-CM | POA: Diagnosis present

## 2023-04-03 DIAGNOSIS — Z1152 Encounter for screening for COVID-19: Secondary | ICD-10-CM | POA: Diagnosis not present

## 2023-04-03 LAB — URINALYSIS, ROUTINE W REFLEX MICROSCOPIC
Bilirubin Urine: NEGATIVE
Glucose, UA: NEGATIVE mg/dL
Hgb urine dipstick: NEGATIVE
Ketones, ur: NEGATIVE mg/dL
Nitrite: POSITIVE — AB
Protein, ur: 30 mg/dL — AB
Specific Gravity, Urine: 1.04 — ABNORMAL HIGH (ref 1.005–1.030)
WBC, UA: >50 WBC/hpf (ref 0–5)
pH: 5 (ref 5.0–8.0)

## 2023-04-03 LAB — BASIC METABOLIC PANEL
Anion gap: 9 (ref 5–15)
BUN: 18 mg/dL (ref 8–23)
CO2: 23 mmol/L (ref 22–32)
Calcium: 8.5 mg/dL — ABNORMAL LOW (ref 8.9–10.3)
Chloride: 108 mmol/L (ref 98–111)
Creatinine, Ser: 0.9 mg/dL (ref 0.44–1.00)
GFR, Estimated: >60 mL/min (ref >60)
Glucose, Bld: 92 mg/dL (ref 70–99)
Potassium: 4 mmol/L (ref 3.5–5.1)
Sodium: 140 mmol/L (ref 135–145)

## 2023-04-03 LAB — CBC
HCT: 39.9 % (ref 36.0–46.0)
Hemoglobin: 12.5 g/dL (ref 12.0–15.0)
MCH: 28 pg (ref 26.0–34.0)
MCHC: 31.3 g/dL (ref 30.0–36.0)
MCV: 89.5 fL (ref 80.0–100.0)
Platelets: 199 10*3/uL (ref 150–400)
RBC: 4.46 MIL/uL (ref 3.87–5.11)
RDW: 14.6 % (ref 11.5–15.5)
WBC: 7.5 10*3/uL (ref 4.0–10.5)
nRBC: 0 % (ref 0.0–0.2)

## 2023-04-03 LAB — C-REACTIVE PROTEIN: CRP: 0.6 mg/dL (ref <1.0)

## 2023-04-03 LAB — SEDIMENTATION RATE: Sed Rate: 3 mm/hr (ref 0–22)

## 2023-04-03 LAB — HEPARIN LEVEL (UNFRACTIONATED)
Heparin Unfractionated: 0.76 IU/mL — ABNORMAL HIGH (ref 0.30–0.70)
Heparin Unfractionated: 0.81 IU/mL — ABNORMAL HIGH (ref 0.30–0.70)

## 2023-04-03 LAB — PROCALCITONIN: Procalcitonin: <0.1 ng/mL

## 2023-04-03 NOTE — Progress Notes (Signed)
Patient ID: Renee Adams, female   DOB: 16-May-1934, 89 y.o.   MRN: [id number] [facility] Surgery Progress Note     Subjective: CC-  Feeling much better this morning. States that her abdominal pain is less. No longer needing pain medication. No flatus or BM. NG with 700cc out  Objective: Vital signs in last 24 hours: Temp:  [97.6 F (36.4 C)-99.3 F (37.4 C)] 99.1 F (37.3 C) (09/25 0904) Pulse Rate:  [73-88] 81 (09/25 0904) Resp:  [16-17] 17 (09/25 0600) BP: (127-151)/(78-85) 137/80 (09/25 0904) SpO2:  [85 %-94 %] 94 % (09/25 0904) Weight:  [65.2 kg-66.2 kg] 66.2 kg (09/25 0600) Last BM Date : 04/02/23  Intake/Output from previous day: 09/24 0701 - 09/25 0700 In: 1148.5 [I.V.:978.5; NG/GT:170] Out: 700 [Emesis/NG output:700] Intake/Output this shift: No intake/output data recorded.  PE: Gen:  Alert, NAD, pleasant Abd: soft, nondistend, minimal lower abdominal TTP without rebound or guarding, bowel sounds present  Lab Results:  Recent Labs    04/02/23 0530 04/03/23 0827  WBC 7.7 7.5  HGB 14.7 12.5  HCT 47.1* 39.9  PLT 229 199   BMET Recent Labs    04/02/23 0530  NA 142  K 4.3  CL 103  CO2 25  GLUCOSE 115*  BUN 17  CREATININE 0.84  CALCIUM 10.0   PT/INR Recent Labs    04/01/23 1406 04/02/23 1040  LABPROT  --  18.8*  INR 1.6* 1.6*   CMP     Component Value Date/Time   NA 142 04/02/2023 0530   NA 142 10/11/2016 1242   K 4.3 04/02/2023 0530   K 4.4 10/11/2016 1242   CL 103 04/02/2023 0530   CL 109 (H) 09/10/2012 1315   CO2 25 04/02/2023 0530   CO2 24 10/11/2016 1242   GLUCOSE 115 (H) 04/02/2023 0530   GLUCOSE 80 10/11/2016 1242   GLUCOSE 94 09/10/2012 1315   BUN 17 04/02/2023 0530   BUN 15.6 10/11/2016 1242   CREATININE 0.84 04/02/2023 0530   CREATININE 0.78 11/02/2021 1247   CREATININE 0.9 10/11/2016 1242   CALCIUM 10.0 04/02/2023 0530   CALCIUM 9.8 10/11/2016 1242   PROT 6.8 04/02/2023 0530   PROT 6.8 10/11/2016 1242   ALBUMIN  3.7 04/02/2023 0530   ALBUMIN 3.9 10/11/2016 1242   AST 23 04/02/2023 0530   AST 22 11/02/2021 1247   AST 20 10/11/2016 1242   ALT 13 04/02/2023 0530   ALT 14 11/02/2021 1247   ALT 10 10/11/2016 1242   ALKPHOS 84 04/02/2023 0530   ALKPHOS 103 10/11/2016 1242   BILITOT 0.6 04/02/2023 0530   BILITOT 0.4 11/02/2021 1247   BILITOT 0.78 10/11/2016 1242   GFRNONAA >60 04/02/2023 0530   GFRNONAA >60 11/02/2021 1247   GFRAA >60 01/19/2020 1120   GFRAA 54 (L) 01/01/2019 1500   Lipase     Component Value Date/Time   LIPASE 25 04/02/2023 0530       Studies/Results: DG Abd 1 View  Result Date: 04/02/2023 CLINICAL DATA:  [id number] Encounter for imaging study to confirm nasogastric (NG) tube placement [id number] EXAM: ABDOMEN - 1 VIEW COMPARISON:  None Available. FINDINGS: Enteric tube courses below the diaphragm with tip and side port overlying the expected region the gastric lumen. The bowel gas pattern is normal. No radio-opaque calculi or other significant radiographic abnormality are seen. Vascular clips overlie the left chest. IMPRESSION: Enteric tube in good position. Electronically Signed   By: Tish Frederickson M.D.   On: 04/02/2023 21:53  DG Abd Portable 1V-Small Bowel Protocol-Position Verification  Result Date: 04/02/2023 CLINICAL DATA:  NG tube EXAM: PORTABLE ABDOMEN - 1 VIEW COMPARISON:  CT abdomen and pelvis same day FINDINGS: Nasogastric tube tip is in the mid gastric body. Surgical clips are noted in the left chest. There is scoliosis of the thoracolumbar spine. IMPRESSION: Nasogastric tube tip is in the mid gastric body. Electronically Signed   By: Darliss Cheney M.D.   On: 04/02/2023 17:34   CT ABDOMEN PELVIS W CONTRAST  Result Date: 04/02/2023 CLINICAL DATA:  Nausea, vomiting EXAM: CT ABDOMEN AND PELVIS WITH CONTRAST TECHNIQUE: Multidetector CT imaging of the abdomen and pelvis was performed using the standard protocol following bolus administration of intravenous contrast.  RADIATION DOSE REDUCTION: This exam was performed according to the departmental dose-optimization program which includes automated exposure control, adjustment of the mA and/or kV according to patient size and/or use of iterative reconstruction technique. CONTRAST:  75mL OMNIPAQUE IOHEXOL 350 MG/ML SOLN COMPARISON:  None Available. FINDINGS: Lower chest: Linear scarring in the lung bases. No effusions. Dense mitral valve annular calcifications. Hepatobiliary: No focal hepatic abnormality. Gallbladder unremarkable. Pancreas: No focal abnormality or ductal dilatation. Spleen: No focal abnormality.  Normal size. Adrenals/Urinary Tract: No adrenal abnormality. No suspicious focal renal abnormality. No stones or hydronephrosis. Urinary bladder obscured by beam hardening artifact from bilateral hip replacements. Stomach/Bowel: Left colonic diverticulosis. No active diverticulitis. Abnormal appearing distal small bowel loops with thick walls and surrounding edema. Findings concerning for enteritis. Proximal to these loops, the small bowel is mildly prominent and fluid-filled suggesting the possibility of low grade small bowel obstruction. Vascular/Lymphatic: Aortoiliac atherosclerosis. No evidence of aneurysm or adenopathy. Reproductive: Obscured by beam hardening artifact from bilateral hip replacements. Other: Small amount of free fluid in the pelvis. No free air. Small umbilical hernia containing fat. Musculoskeletal: No acute bony abnormality. IMPRESSION: Several distal small bowel loops appear thick walled with surrounding edema/inflammation concerning for enteritis. The small bowel loops proximal to this are mildly prominent and fluid-filled suggesting the possibility of low grade small bowel obstruction. Left colonic diverticulosis. Aortic atherosclerosis. Electronically Signed   By: Charlett Nose M.D.   On: 04/02/2023 09:37    Anti-infectives: Anti-infectives (From admission, onward)    None         Assessment/Plan SBO vs enteritis  - No prior h/o abdominal surgery - CT scan 9/24 shows several thick walled distal small bowel loops with surrounding edema/inflammation concerning for enteritis; the small bowel loops proximal to this are mildly prominent and fluid-filled suggesting the possibility of low grade small bowel obstruction - Gastrograffin given around 1am, she should have a follow up film this morning. No return in bowel function but pain improved. Continue NG to LIWS for now. Mobilize.    ID - none VTE - holding coumadin >> heparin gtt FEN - IVF, NPO/NGT to LIWS Foley - none   Hx CVA, ASD on coumadin HTN HLD Glaucoma IBS  I reviewed last 24 h vitals and pain scores, last 48 h intake and output, last 24 h labs and trends, and last 24 h imaging results.    LOS: 0 days    [name], [facility] Surgery 04/03/2023, 9:18 AM Please see Amion for pager number during day hours 7:00am-4:30pm

## 2023-04-03 NOTE — Progress Notes (Signed)
ANTICOAGULATION CONSULT NOTE - follow-up  Pharmacy Consult for transition from coumadin to heparin infusion Indication:  history of CVA w/ PFO  Allergies  Allergen Reactions   Dilaudid [Hydromorphone Hcl] Shortness Of Breath    Tolerates tramadol   Hydromorphone Other (See Comments)    UNKNOWN TO PATIENT   Morphine Other (See Comments)    UNKNOWN TO PATIENT   Morphine And Codeine Shortness Of Breath    Tolerates tramadol   Sulfamethoxazole Other (See Comments)    UNKNOWN TO PATIENT   Sulfa Antibiotics Other (See Comments)    "Made me drunk";  wobbly   Keflex [Cephalexin] Nausea Only   Levaquin [Levofloxacin] Hives and Rash    Patient Measurements: Weight: 66.2 kg (146 lb) Heparin Dosing Weight: 65.2 Kg  Vital Signs: Temp: 99.1 F (37.3 C) (09/Renee 0904) Temp Source: Oral (09/Renee 0600) BP: 137/80 (09/Renee 0904) Pulse Rate: 81 (09/Renee 0904)  Labs: Recent Labs    04/01/23 1406 04/02/23 0530 04/02/23 1040 04/02/23 2141 09/Renee/24 0827  HGB  --  14.7  --   --  12.5  HCT  --  47.1*  --   --  39.9  PLT  --  229  --   --  199  LABPROT  --   --  18.8*  --   --   INR 1.6*  --  1.6*  --   --   HEPARINUNFRC  --   --   --  0.Renee* 0.76*  CREATININE  --  0.84  --   --  0.90    Estimated Creatinine Clearance: 41 mL/min (by C-G formula based on SCr of 0.9 mg/dL).   Medical History: Past Medical History:  Diagnosis Date   ASD (atrial septal defect)    Cancer (HCC)    breast, left   Carpal tunnel syndrome, bilateral    Depression    Gait disorder    GERD (gastroesophageal reflux disease)    Glaucoma    Glaucoma    History of stroke    Hypertension    IBS (irritable bowel syndrome)    Personal history of radiation therapy 1996   S/P bunionectomy    bilateral     Assessment: Renee Adams admitted w/ cc of abdominal pain. She has a history of CVA (1997 and 2004) in the setting of PFO. She is maintained on coumadin in the ambulatory care setting. Current INR is 1.6 (9/24 10:40).  A consult is placed for a heparin infusion  9/Renee AM update: HL 0.76 No signs of bleeding or issues with heparin infusion  Goal of Therapy:  Heparin level 0.3-0.7 units/ml Monitor platelets by anticoagulation protocol: Yes   Plan:  Decrease heparin infusion to 850 units/hr Check anti-Xa level in 8 hours and daily while on heparin Continue to monitor H&H and platelets  Rakesha Dalporto BS, PharmD, BCPS Clinical Pharmacist 9/Renee/2024 10:29 AM  Contact: [phone] after 3 PM  "Be curious, not judgmental..." -Debbora Dus

## 2023-04-03 NOTE — Progress Notes (Signed)
ANTICOAGULATION CONSULT NOTE  Pharmacy Consult for transition from coumadin to heparin infusion Indication:  history of CVA w/ PFO  Allergies  Allergen Reactions   Dilaudid [Hydromorphone Hcl] Shortness Of Breath    Tolerates tramadol   Hydromorphone Other (See Comments)    UNKNOWN TO PATIENT   Morphine Other (See Comments)    UNKNOWN TO PATIENT   Morphine And Codeine Shortness Of Breath    Tolerates tramadol   Sulfamethoxazole Other (See Comments)    UNKNOWN TO PATIENT   Sulfa Antibiotics Other (See Comments)    "Made me drunk";  wobbly   Keflex [Cephalexin] Nausea Only   Levaquin [Levofloxacin] Hives and Rash    Patient Measurements: Weight: 66.2 kg (146 lb) Heparin Dosing Weight: 65.2 Kg  Vital Signs: Temp: 97.8 F (36.6 C) (09/25 1637) BP: 113/68 (09/25 1637) Pulse Rate: 67 (09/25 1637)  Labs: Recent Labs    04/01/23 1406 04/02/23 0530 04/02/23 1040 04/02/23 2141 04/03/23 0827 04/03/23 1740  HGB  --  14.7  --   --  12.5  --   HCT  --  47.1*  --   --  39.9  --   PLT  --  229  --   --  199  --   LABPROT  --   --  18.8*  --   --   --   INR 1.6*  --  1.6*  --   --   --   HEPARINUNFRC  --   --   --  0.25* 0.76* 0.81*  CREATININE  --  0.84  --   --  0.90  --     Estimated Creatinine Clearance: 41 mL/min (by C-G formula based on SCr of 0.9 mg/dL).  Assessment: 37 YOF admitted w/ cc of abdominal pain. She has a history of CVA (1997 and 2004) in the setting of PFO. She is maintained on coumadin in the ambulatory care setting. Current INR is 1.6 (9/24 10:40). A consult is placed for a heparin infusion  Heparin level supratherapeutic (0.81) on infusion at 850 units/hr. Heparin level increased further even with rate decrease. No bleeding noted.  Goal of Therapy:  Heparin level 0.3-0.7 units/ml Monitor platelets by anticoagulation protocol: Yes   Plan:  Decrease heparin infusion to 700 units/hr Check anti-Xa level in 8 hours   Christoper Fabian, PharmD, BCPS Please  see amion for complete clinical pharmacist phone list 04/03/2023 6:48 PM

## 2023-04-03 NOTE — Plan of Care (Signed)

## 2023-04-03 NOTE — Plan of Care (Signed)

## 2023-04-03 NOTE — Progress Notes (Signed)
PROGRESS NOTE    Renee Adams  [name]:[id number] DOB: October 23, 1933 DOA: 04/02/2023 PCP: Irven Coe, MD    Brief Narrative:   Renee Adams is a 89 y.o. female with past medical history significant for HTN, paroxysmal atrial fibrillation on Coumadin, CVA, questionable PFO, left breast cancer s/p lumpectomy 1997 in remission, GERD presenting to [facility] ED on 9/24 with complaints of abdominal pain.  Onset day prior to admission. Pain localized around her waist and lower abdomen; crampy in nature.  Associated with nausea and dry heaves.  Denies any history of of small bowel obstruction or previous abdominal surgery.  Recently had extraction of her upper molar teeth last week and has been on a Soft diet, completed antibiotics.   In the ED, temperature 99.0 F, HR 81, RR 18, BP 145/84, SpO2 90% on room air.  WBC 7.7, hemoglobin 14.7, platelet count 229.  Sodium 142, potassium 4.3, chloride 103, CO2 25, glucose 115, BUN 17, creatinine 0.84.  AST 23, ALT 13, total bilirubin 0.6.  Lipase 25.  COVID PCR negative.  CT Abdo/pelvis with several distal small loops of thick-walled with surrounding edema/inflammation concerning for enteritis, small bowel loops proximal to this are mildly prominent and fluid-filled suggesting of low-grade small bowel obstruction, left colonic diverticulosis.  Patient was administered antiemetics, IV fluids, Toradol and fentanyl.  General surgery consulted.  NG tube was recommended.  TRH consulted for admission for further evaluation and management of small bowel obstruction, enteritis.  Assessment & Plan:   Small bowel obstruction Enteritis Patient presenting to ED with 1 day history of crampy abdominal pain associated nausea and dry heaves.  No previous history of abdominal surgeries or small bowel obstructions in the past.  Patient is afebrile without leukocytosis.  Procalcitonin less than 0.10.  CRP 0.6, ESR 6. --General Surgery following, appreciate assistance -- N.p.o., NGT to  LWIS -- NS at 75 mL/h --Toradol/IV fentanyl as needed pain control -- Antiemetics as needed -- Strict I's and O's, monitor NG output, 700 mL past 24 hours  Essential hypertension Currently not on antihypertensives outpatient.  Paroxysmal atrial fibrillation on Coumadin Hx CVA/PFO -- Holding Coumadin -- Heparin drip, pharmacy consulted for dosing/monitoring  Anxiety Holding home Zoloft and Xanax while NPO. --Lorazepam 0.5 mg IV every 6 hours as needed anxiety  History of breast cancer Patient status post left lumpectomy back in 1997 and has been in remission.  Recent tooth extraction Patient with recent multiple upper molars extracted in preparation for dentures last week.  Patient reports completed outpatient course of oral amoxicillin.  GERD -- Protonix 40 mg IV every 24 hours   DVT prophylaxis: Heparin drip    Code Status: Full Code Family Communication: Updated family present at bedside this morning  Disposition Plan:  Level of care: Telemetry Medical Status is: Observation The patient remains OBS appropriate and will d/c before 2 midnights.    Consultants:  General surgery  Procedures:  NG tube placement  Antimicrobials:  None   Subjective: Patient seen examined bedside, resting calmly.  Lying in bed.  Reports abdominal pain is much improved with evacuation of the fluid.  Remains with NG tube in place, on low wall suction.  Discussed with general surgery PA this morning, will continue to obtain serial abdominal films.  No other specific questions or concerns at this time.  Denies headache, no visual changes, no chest pain, no palpitations, no shortness of breath, no fever/chills/night sweats, no current nausea/vomiting, no diarrhea, no focal weakness, no fatigue, no paresthesias.  No acute events overnight per nursing staff.  Objective: Vitals:   04/02/23 2035 04/03/23 0026 04/03/23 0600 04/03/23 0904  BP:  (!) 140/83 (!) 151/85 137/80  Pulse:  81 88 81   Resp:  17 17   Temp:  98.1 F (36.7 C) 99.2 F (37.3 C) 99.1 F (37.3 C)  TempSrc:  Oral Oral   SpO2: 93% 94% 93% 94%  Weight:   66.2 kg     Intake/Output Summary (Last 24 hours) at 04/03/2023 1305 Last data filed at 04/03/2023 1100 Gross per 24 hour  Intake 1148.47 ml  Output 1100 ml  Net 48.47 ml   Filed Weights   04/02/23 1439 04/03/23 0600  Weight: 65.2 kg 66.2 kg    Examination:  Physical Exam: GEN: NAD, alert and oriented x 3, wd/wn HEENT: NCAT, PERRL, EOMI, sclera clear, MMM PULM: CTAB w/o wheezes/crackles, normal respiratory effort CV: RRR w/o M/G/R GI: abd soft, nondistended, mild lower quadrant TTP, faint bowel sounds, no R/G/M MSK: no peripheral edema, muscle strength globally intact 5/5 bilateral upper/lower extremities NEURO: CN II-XII intact, no focal deficits, sensation to light touch intact PSYCH: normal mood/affect Integumentary: dry/intact, no rashes or wounds    Data Reviewed: I have personally reviewed following labs and imaging studies  CBC: Recent Labs  Lab 04/02/23 0530 04/03/23 0827  WBC 7.7 7.5  NEUTROABS 6.0  --   HGB 14.7 12.5  HCT 47.1* 39.9  MCV 88.7 89.5  PLT 229 199   Basic Metabolic Panel: Recent Labs  Lab 04/02/23 0530 04/02/23 1040 04/03/23 0827  NA 142  --  140  K 4.3  --  4.0  CL 103  --  108  CO2 25  --  23  GLUCOSE 115*  --  92  BUN 17  --  18  CREATININE 0.84  --  0.90  CALCIUM 10.0  --  8.5*  MG  --  2.1  --    GFR: Estimated Creatinine Clearance: 41 mL/min (by C-G formula based on SCr of 0.9 mg/dL). Liver Function Tests: Recent Labs  Lab 04/02/23 0530  AST 23  ALT 13  ALKPHOS 84  BILITOT 0.6  PROT 6.8  ALBUMIN 3.7   Recent Labs  Lab 04/02/23 0530  LIPASE 25   No results for input(s): "AMMONIA" in the last 168 hours. Coagulation Profile: Recent Labs  Lab 04/01/23 1406 04/02/23 1040  INR 1.6* 1.6*   Cardiac Enzymes: No results for input(s): "CKTOTAL", "CKMB", "CKMBINDEX", "TROPONINI"  in the last 168 hours. BNP (last 3 results) No results for input(s): "PROBNP" in the last 8760 hours. HbA1C: No results for input(s): "HGBA1C" in the last 72 hours. CBG: No results for input(s): "GLUCAP" in the last 168 hours. Lipid Profile: No results for input(s): "CHOL", "HDL", "LDLCALC", "TRIG", "CHOLHDL", "LDLDIRECT" in the last 72 hours. Thyroid Function Tests: No results for input(s): "TSH", "T4TOTAL", "FREET4", "T3FREE", "THYROIDAB" in the last 72 hours. Anemia Panel: No results for input(s): "VITAMINB12", "FOLATE", "FERRITIN", "TIBC", "IRON", "RETICCTPCT" in the last 72 hours. Sepsis Labs: Recent Labs  Lab 04/03/23 0827  PROCALCITON <0.10    Recent Results (from the past 240 hour(s))  SARS Coronavirus 2 by RT PCR (hospital order, performed in [facility] hospital lab) *cepheid single result test* Anterior Nasal Swab     Status: None   Collection Time: 04/02/23 10:14 AM   Specimen: Anterior Nasal Swab  Result Value Ref Range Status   SARS Coronavirus 2 by RT PCR NEGATIVE NEGATIVE Final  Comment: Performed at [facility], [address], [place], [place] [address]         Radiology Studies: DG Abd 1 View  Result Date: 04/02/2023 CLINICAL DATA:  [id number] Encounter for imaging study to confirm nasogastric (NG) tube placement [id number] EXAM: ABDOMEN - 1 VIEW COMPARISON:  None Available. FINDINGS: Enteric tube courses below the diaphragm with tip and side port overlying the expected region the gastric lumen. The bowel gas pattern is normal. No radio-opaque calculi or other significant radiographic abnormality are seen. Vascular clips overlie the left chest. IMPRESSION: Enteric tube in good position. Electronically Signed   By: Tish Frederickson M.D.   On: 04/02/2023 21:53   DG Abd Portable 1V-Small Bowel Protocol-Position Verification  Result Date: 04/02/2023 CLINICAL DATA:  NG tube EXAM: PORTABLE ABDOMEN - 1 VIEW COMPARISON:  CT abdomen and pelvis same day FINDINGS:  Nasogastric tube tip is in the mid gastric body. Surgical clips are noted in the left chest. There is scoliosis of the thoracolumbar spine. IMPRESSION: Nasogastric tube tip is in the mid gastric body. Electronically Signed   By: Darliss Cheney M.D.   On: 04/02/2023 17:34   CT ABDOMEN PELVIS W CONTRAST  Result Date: 04/02/2023 CLINICAL DATA:  Nausea, vomiting EXAM: CT ABDOMEN AND PELVIS WITH CONTRAST TECHNIQUE: Multidetector CT imaging of the abdomen and pelvis was performed using the standard protocol following bolus administration of intravenous contrast. RADIATION DOSE REDUCTION: This exam was performed according to the departmental dose-optimization program which includes automated exposure control, adjustment of the mA and/or kV according to patient size and/or use of iterative reconstruction technique. CONTRAST:  75mL OMNIPAQUE IOHEXOL 350 MG/ML SOLN COMPARISON:  None Available. FINDINGS: Lower chest: Linear scarring in the lung bases. No effusions. Dense mitral valve annular calcifications. Hepatobiliary: No focal hepatic abnormality. Gallbladder unremarkable. Pancreas: No focal abnormality or ductal dilatation. Spleen: No focal abnormality.  Normal size. Adrenals/Urinary Tract: No adrenal abnormality. No suspicious focal renal abnormality. No stones or hydronephrosis. Urinary bladder obscured by beam hardening artifact from bilateral hip replacements. Stomach/Bowel: Left colonic diverticulosis. No active diverticulitis. Abnormal appearing distal small bowel loops with thick walls and surrounding edema. Findings concerning for enteritis. Proximal to these loops, the small bowel is mildly prominent and fluid-filled suggesting the possibility of low grade small bowel obstruction. Vascular/Lymphatic: Aortoiliac atherosclerosis. No evidence of aneurysm or adenopathy. Reproductive: Obscured by beam hardening artifact from bilateral hip replacements. Other: Small amount of free fluid in the pelvis. No free air.  Small umbilical hernia containing fat. Musculoskeletal: No acute bony abnormality. IMPRESSION: Several distal small bowel loops appear thick walled with surrounding edema/inflammation concerning for enteritis. The small bowel loops proximal to this are mildly prominent and fluid-filled suggesting the possibility of low grade small bowel obstruction. Left colonic diverticulosis. Aortic atherosclerosis. Electronically Signed   By: Charlett Nose M.D.   On: 04/02/2023 09:37        Scheduled Meds:  fentaNYL (SUBLIMAZE) injection  50 mcg Intravenous Once   pantoprazole (PROTONIX) IV  40 mg Intravenous Daily   sodium chloride flush  3 mL Intravenous Q12H   Continuous Infusions:  sodium chloride 75 mL/hr at 04/03/23 0608   heparin 850 Units/hr (04/03/23 1115)     LOS: 0 days    Time spent: 51 minutes spent on chart review, discussion with nursing staff, consultants, updating family and interview/physical exam; more than 50% of that time was spent in counseling and/or coordination of care.    Alvira Philips [place], DO [facility]  Available via Epic secure chat 7am-7pm After these hours, please refer to coverage provider listed on [email or web address] 04/03/2023, 1:05 PM

## 2023-04-04 ENCOUNTER — Inpatient Hospital Stay (HOSPITAL_COMMUNITY): Payer: Medicare Other

## 2023-04-04 DIAGNOSIS — K529 Noninfective gastroenteritis and colitis, unspecified: Secondary | ICD-10-CM | POA: Diagnosis not present

## 2023-04-04 LAB — BASIC METABOLIC PANEL
Anion gap: 3 — ABNORMAL LOW (ref 5–15)
BUN: 18 mg/dL (ref 8–23)
CO2: 24 mmol/L (ref 22–32)
Calcium: 8.2 mg/dL — ABNORMAL LOW (ref 8.9–10.3)
Chloride: 112 mmol/L — ABNORMAL HIGH (ref 98–111)
Creatinine, Ser: 0.73 mg/dL (ref 0.44–1.00)
GFR, Estimated: >60 mL/min (ref >60)
Glucose, Bld: 69 mg/dL — ABNORMAL LOW (ref 70–99)
Potassium: 3.7 mmol/L (ref 3.5–5.1)
Sodium: 139 mmol/L (ref 135–145)

## 2023-04-04 LAB — CBC
HCT: 37.6 % (ref 36.0–46.0)
Hemoglobin: 11.9 g/dL — ABNORMAL LOW (ref 12.0–15.0)
MCH: 29.2 pg (ref 26.0–34.0)
MCHC: 31.6 g/dL (ref 30.0–36.0)
MCV: 92.2 fL (ref 80.0–100.0)
Platelets: 150 10*3/uL (ref 150–400)
RBC: 4.08 MIL/uL (ref 3.87–5.11)
RDW: 14.6 % (ref 11.5–15.5)
WBC: 6.1 10*3/uL (ref 4.0–10.5)
nRBC: 0 % (ref 0.0–0.2)

## 2023-04-04 LAB — HEPARIN LEVEL (UNFRACTIONATED)
Heparin Unfractionated: 0.53 IU/mL (ref 0.30–0.70)
Heparin Unfractionated: 0.48 IU/mL (ref 0.30–0.70)

## 2023-04-04 LAB — MAGNESIUM: Magnesium: 2.1 mg/dL (ref 1.7–2.4)

## 2023-04-04 MED ORDER — ALPRAZOLAM 0.5 MG PO TABS
0.2500 mg | ORAL_TABLET | ORAL | Status: DC | PRN
  Administered 2023-04-05 – 2023-04-06 (×3): 0.25 mg via ORAL
  Filled 2023-04-04 (×3): qty 1

## 2023-04-04 MED ORDER — ROPINIROLE HCL 1 MG PO TABS: 1.0000 mg | ORAL_TABLET | Freq: Every evening | ORAL | Status: DC | PRN

## 2023-04-04 MED ORDER — MENTHOL 3 MG MT LOZG
1.0000 | LOZENGE | OROMUCOSAL | Status: DC | PRN
  Administered 2023-04-07: 3 mg via ORAL
  Filled 2023-04-04: qty 9

## 2023-04-04 MED ORDER — ROPINIROLE HCL 1 MG PO TABS
1.0000 mg | ORAL_TABLET | Freq: Three times a day (TID) | ORAL | Status: DC | PRN
  Administered 2023-04-04 – 2023-04-07 (×6): 1 mg via ORAL
  Filled 2023-04-04 (×6): qty 1

## 2023-04-04 MED ORDER — ENOXAPARIN SODIUM 80 MG/0.8ML IJ SOSY
70.0000 mg | PREFILLED_SYRINGE | Freq: Two times a day (BID) | INTRAMUSCULAR | Status: DC
  Administered 2023-04-04 – 2023-04-07 (×6): 70 mg via SUBCUTANEOUS
  Filled 2023-04-04 (×7): qty 0.8

## 2023-04-04 MED ORDER — LORAZEPAM 2 MG/ML IJ SOLN
0.5000 mg | Freq: Four times a day (QID) | INTRAMUSCULAR | Status: DC | PRN
  Administered 2023-04-04: 0.5 mg via INTRAVENOUS
  Filled 2023-04-04: qty 1

## 2023-04-04 NOTE — Progress Notes (Signed)
Pt NG tube was dislodged. Pt passing gas and has bowel sounds. Message left with surgery answering service. Awaiting call back. Pt denies N/V at this time. Nursing plan of care ongoing.

## 2023-04-04 NOTE — Plan of Care (Signed)
  Problem: Education: Goal: Knowledge of General Education information will improve Description: Including pain rating scale, medication(s)/side effects and non-pharmacologic comfort measures 04/04/2023 1936 by Jerelene Redden, RN Outcome: Progressing 04/04/2023 1936 by Jerelene Redden, RN Outcome: Progressing   Problem: Nutrition: Goal: Adequate nutrition will be maintained 04/04/2023 1936 by Jerelene Redden, RN Outcome: Progressing 04/04/2023 1936 by Jerelene Redden, RN Outcome: Progressing   Problem: Coping: Goal: Level of anxiety will decrease 04/04/2023 1936 by Jerelene Redden, RN Outcome: Progressing 04/04/2023 1936 by Jerelene Redden, RN Outcome: Progressing   Problem: Pain Managment: Goal: General experience of comfort will improve Outcome: Progressing

## 2023-04-04 NOTE — Progress Notes (Addendum)
ANTICOAGULATION CONSULT NOTE- follow-up  Pharmacy Consult for transition from heparin infusion to lovenox Indication:  history of CVA w/ PFO  Allergies  Allergen Reactions   Dilaudid [Hydromorphone Hcl] Shortness Of Breath    Tolerates tramadol   Hydromorphone Other (See Comments)    UNKNOWN TO PATIENT   Morphine Other (See Comments)    UNKNOWN TO PATIENT   Morphine And Codeine Shortness Of Breath    Tolerates tramadol   Sulfamethoxazole Other (See Comments)    UNKNOWN TO PATIENT   Sulfa Antibiotics Other (See Comments)    "Made me drunk";  wobbly   Keflex [Cephalexin] Nausea Only   Levaquin [Levofloxacin] Hives and Rash    Patient Measurements: Weight: 66.2 kg (146 lb) Heparin Dosing Weight: 65.2 Kg  Vital Signs: Temp: 98.5 F (36.9 C) (09/26 0805) Temp Source: Oral (09/26 0805) BP: 134/79 (09/26 0805) Pulse Rate: 71 (09/26 0805)  Labs: Recent Labs     0000 04/01/23 1406 04/02/23 0530 04/02/23 1040 04/02/23 2141 04/03/23 0827 04/03/23 1740 04/04/23 0457 04/04/23 1121  HGB   < >  --  14.7  --   --  12.5  --  11.9*  --   HCT  --   --  47.1*  --   --  39.9  --  37.6  --   PLT  --   --  229  --   --  199  --  150  --   LABPROT  --   --   --  18.8*  --   --   --   --   --   INR  --  1.6*  --  1.6*  --   --   --   --   --   HEPARINUNFRC  --   --   --   --    < > 0.76* 0.81* 0.53 0.48  CREATININE  --   --  0.84  --   --  0.90  --  0.73  --    < > = values in this interval not displayed.    Estimated Creatinine Clearance: 46.1 mL/min (by C-G formula based on SCr of 0.73 mg/dL).  Assessment: 23 YOF admitted w/ cc of abdominal pain. She has a history of CVA (1997 and 2004) in the setting of PFO. She is maintained on coumadin in the ambulatory care setting. Current INR is 1.6 (9/24 10:40). A consult is placed for a heparin infusion  Heparin level supratherapeutic (0.81) on infusion at 850 units/hr. Heparin level increased further even with rate decrease. No bleeding  noted.  9/26 AM update: HL 0.48- therapeutic Patient is bleeding at the IV site / no issues with the hep gtt   Goal of Therapy:  Monitor platelets by anticoagulation protocol: Yes   Plan:  Spoke with provider. Patient is not at a point where we can transition her back to oral therapy. Given the current bleeding from IV site, we will hold heparin for an hour and then start her on 70 mg sq q12h CBC daily Monitor for signs of bleeding   [name] BS, PharmD, BCPS Clinical Pharmacist 04/04/2023 1:24 PM  Contact: [phone] after 3 PM  "Be curious, not judgmental..." -Debbora Dus

## 2023-04-04 NOTE — Progress Notes (Signed)
PROGRESS NOTE    Renee Adams  ZOX:[id number] DOB: 1933-11-06 DOA: 04/02/2023 PCP: Irven Coe, MD    Brief Narrative:   Renee Adams is a 89 y.o. female with past medical history significant for HTN, CVA w/ PFO on coumadin, left breast cancer s/p lumpectomy 1997 in remission, GERD presenting to [facility] ED on 9/24 with complaints of abdominal pain.  Onset day prior to admission. Pain localized around her waist and lower abdomen; crampy in nature.  Associated with nausea and dry heaves.  Denies any history of of small bowel obstruction or previous abdominal surgery.  Recently had extraction of her upper molar teeth last week and has been on a Soft diet, completed antibiotics.   In the ED, temperature 99.0 F, HR 81, RR 18, BP 145/84, SpO2 90% on room air.  WBC 7.7, hemoglobin 14.7, platelet count 229.  Sodium 142, potassium 4.3, chloride 103, CO2 25, glucose 115, BUN 17, creatinine 0.84.  AST 23, ALT 13, total bilirubin 0.6.  Lipase 25.  COVID PCR negative.  CT Abdo/pelvis with several distal small loops of thick-walled with surrounding edema/inflammation concerning for enteritis, small bowel loops proximal to this are mildly prominent and fluid-filled suggesting of low-grade small bowel obstruction, left colonic diverticulosis.  Patient was administered antiemetics, IV fluids, Toradol and fentanyl.  General surgery consulted.  NG tube was recommended.  TRH consulted for admission for further evaluation and management of small bowel obstruction, enteritis.  Assessment & Plan:   Small bowel obstruction Enteritis Patient presenting to ED with 1 day history of crampy abdominal pain associated nausea and dry heaves.  No previous history of abdominal surgeries or small bowel obstructions in the past.  Patient is afebrile without leukocytosis.  Procalcitonin less than 0.10.  CRP 0.6, ESR 6. -- General Surgery following, appreciate assistance -- CLD -- NS at 75 mL/h -- Toradol/IV fentanyl as needed  pain control -- Antiemetics as needed -- Strict I's and O's  Essential hypertension Currently not on antihypertensives outpatient.  Hx CVA/PFO -- Holding Coumadin -- Heparin drip, pharmacy consulted for dosing/monitoring  Anxiety Holding home Zoloft and Xanax while NPO. -- Lorazepam 0.5 mg IV every 6 hours as needed anxiety  Restless leg syndrome -- Requip 1mg  PO at bedtime PRN  History of breast cancer Patient status post left lumpectomy back in 1997 and has been in remission.  Recent tooth extraction Patient with recent multiple upper molars extracted in preparation for dentures last week.  Patient reports completed outpatient course of oral amoxicillin.  GERD -- Protonix 40 mg IV every 24 hours   DVT prophylaxis: Heparin drip    Code Status: Full Code Family Communication: Updated family present at bedside this morning  Disposition Plan:  Level of care: Med-Surg Status is: Inpatient Remains inpatient appropriate because: Needs further advancement of diet with toleration and general surgery signed off for stable for discharge home      Consultants:  General surgery  Procedures:  NG tube placement  Antimicrobials:  None   Subjective: Patient seen examined bedside, resting calmly.  Lying in bed.  NG tube dislodged overnight and now removed.  Reports flatus.  No bowel movement since admission.  Abdominal pain improved.  Seen by general surgery this morning, starting clear liquid diet.  Reports difficulty sleeping overnight due to her restless legs.  No other specific questions or concerns at this time.  Denies headache, no visual changes, no chest pain, no palpitations, no shortness of breath, no fever/chills/night sweats, no nausea/vomiting,  no diarrhea, no focal weakness, no fatigue, no paresthesias.  No other acute events overnight per nursing staff.  Objective: Vitals:   04/03/23 1953 04/04/23 0027 04/04/23 0553 04/04/23 0805  BP: 137/73 138/77 137/63  134/79  Pulse: 73 69 75 71  Resp: 18 18 18 16   Temp: 98.6 F (37 C) 99.1 F (37.3 C) 98.6 F (37 C) 98.5 F (36.9 C)  TempSrc: Oral Oral Oral Oral  SpO2: 95% 95% 95% 97%  Weight:        Intake/Output Summary (Last 24 hours) at 04/04/2023 1131 Last data filed at 04/04/2023 0421 Gross per 24 hour  Intake 2050.37 ml  Output --  Net 2050.37 ml   Filed Weights   04/02/23 1439 04/03/23 0600  Weight: 65.2 kg 66.2 kg    Examination:  Physical Exam: GEN: NAD, alert and oriented x 3, wd/wn HEENT: NCAT, PERRL, EOMI, sclera clear, MMM PULM: CTAB w/o wheezes/crackles, normal respiratory effort CV: RRR w/o M/G/R GI: abd soft, nondistended, mild lower quadrant TTP, faint bowel sounds, no R/G/M MSK: no peripheral edema, muscle strength globally intact 5/5 bilateral upper/lower extremities NEURO: CN II-XII intact, no focal deficits, sensation to light touch intact PSYCH: normal mood/affect Integumentary: dry/intact, no rashes or wounds    Data Reviewed: I have personally reviewed following labs and imaging studies  CBC: Recent Labs  Lab 04/02/23 0530 04/03/23 0827 04/04/23 0457  WBC 7.7 7.5 6.1  NEUTROABS 6.0  --   --   HGB 14.7 12.5 11.9*  HCT 47.1* 39.9 37.6  MCV 88.7 89.5 92.2  PLT 229 199 150   Basic Metabolic Panel: Recent Labs  Lab 04/02/23 0530 04/02/23 1040 04/03/23 0827 04/04/23 0457  NA 142  --  140 139  K 4.3  --  4.0 3.7  CL 103  --  108 112*  CO2 25  --  23 24  GLUCOSE 115*  --  92 69*  BUN 17  --  18 18  CREATININE 0.84  --  0.90 0.73  CALCIUM 10.0  --  8.5* 8.2*  MG  --  2.1  --  2.1   GFR: Estimated Creatinine Clearance: 46.1 mL/min (by C-G formula based on SCr of 0.73 mg/dL). Liver Function Tests: Recent Labs  Lab 04/02/23 0530  AST 23  ALT 13  ALKPHOS 84  BILITOT 0.6  PROT 6.8  ALBUMIN 3.7   Recent Labs  Lab 04/02/23 0530  LIPASE 25   No results for input(s): "AMMONIA" in the last 168 hours. Coagulation Profile: Recent Labs   Lab 04/01/23 1406 04/02/23 1040  INR 1.6* 1.6*   Cardiac Enzymes: No results for input(s): "CKTOTAL", "CKMB", "CKMBINDEX", "TROPONINI" in the last 168 hours. BNP (last 3 results) No results for input(s): "PROBNP" in the last 8760 hours. HbA1C: No results for input(s): "HGBA1C" in the last 72 hours. CBG: No results for input(s): "GLUCAP" in the last 168 hours. Lipid Profile: No results for input(s): "CHOL", "HDL", "LDLCALC", "TRIG", "CHOLHDL", "LDLDIRECT" in the last 72 hours. Thyroid Function Tests: No results for input(s): "TSH", "T4TOTAL", "FREET4", "T3FREE", "THYROIDAB" in the last 72 hours. Anemia Panel: No results for input(s): "VITAMINB12", "FOLATE", "FERRITIN", "TIBC", "IRON", "RETICCTPCT" in the last 72 hours. Sepsis Labs: Recent Labs  Lab 04/03/23 0827  PROCALCITON <0.10    Recent Results (from the past 240 hour(s))  SARS Coronavirus 2 by RT PCR (hospital order, performed in [facility] hospital lab) *cepheid single result test* Anterior Nasal Swab     Status: None  Collection Time: 04/02/23 10:14 AM   Specimen: Anterior Nasal Swab  Result Value Ref Range Status   SARS Coronavirus 2 by RT PCR NEGATIVE NEGATIVE Final    Comment: Performed at [facility], [address], [place], [place] [address]         Radiology Studies: DG Abd Portable 1V  Result Date: 04/04/2023 CLINICAL DATA:  Abdominal pain and distention. EXAM: PORTABLE ABDOMEN - 1 VIEW COMPARISON:  April 03, 2023. FINDINGS: The bowel gas pattern is normal. No significant stool burden is noted. No radio-opaque calculi or other significant radiographic abnormality are seen. IMPRESSION: No abnormal bowel dilatation. Electronically Signed   By: Lupita Raider M.D.   On: 04/04/2023 09:30   DG Abd Portable 1V-Small Bowel Obstruction Protocol-initial, 8 hr delay  Result Date: 04/03/2023 CLINICAL DATA:  Follow-up small bowel obstruction, 8 hour delay. EXAM: PORTABLE ABDOMEN - 1 VIEW COMPARISON:   Radiographs and CT yesterday FINDINGS: Enteric tube tip below the diaphragm in the stomach. There is no enteric contrast in the colon. Persistent air-filled loops of small bowel centrally, not abnormally dilated. Excreted IV contrast in the urinary bladder. IMPRESSION: Persistent air-filled loops of small bowel centrally, not abnormally dilated. No enteric contrast in the colon. Electronically Signed   By: Narda Rutherford M.D.   On: 04/03/2023 13:11   DG Abd 1 View  Result Date: 04/02/2023 CLINICAL DATA:  [id number] Encounter for imaging study to confirm nasogastric (NG) tube placement [id number] EXAM: ABDOMEN - 1 VIEW COMPARISON:  None Available. FINDINGS: Enteric tube courses below the diaphragm with tip and side port overlying the expected region the gastric lumen. The bowel gas pattern is normal. No radio-opaque calculi or other significant radiographic abnormality are seen. Vascular clips overlie the left chest. IMPRESSION: Enteric tube in good position. Electronically Signed   By: Tish Frederickson M.D.   On: 04/02/2023 21:53   DG Abd Portable 1V-Small Bowel Protocol-Position Verification  Result Date: 04/02/2023 CLINICAL DATA:  NG tube EXAM: PORTABLE ABDOMEN - 1 VIEW COMPARISON:  CT abdomen and pelvis same day FINDINGS: Nasogastric tube tip is in the mid gastric body. Surgical clips are noted in the left chest. There is scoliosis of the thoracolumbar spine. IMPRESSION: Nasogastric tube tip is in the mid gastric body. Electronically Signed   By: Darliss Cheney M.D.   On: 04/02/2023 17:34        Scheduled Meds:  fentaNYL (SUBLIMAZE) injection  50 mcg Intravenous Once   pantoprazole (PROTONIX) IV  40 mg Intravenous Daily   sodium chloride flush  3 mL Intravenous Q12H   Continuous Infusions:  sodium chloride 75 mL/hr at 04/04/23 1107   heparin 700 Units/hr (04/04/23 0421)     LOS: 1 day    Time spent: 51 minutes spent on chart review, discussion with nursing staff, consultants, updating family  and interview/physical exam; more than 50% of that time was spent in counseling and/or coordination of care.    Alvira Philips [place], DO [facility] Available via Epic secure chat 7am-7pm After these hours, please refer to coverage provider listed on [email or web address] 04/04/2023, 11:31 AM

## 2023-04-04 NOTE — Progress Notes (Signed)
Phlebotomy contacted regarding drawing Pt heparin levels. Nursing plan of care ongoing.

## 2023-04-04 NOTE — Progress Notes (Signed)
Patient ID: Renee Adams, female   DOB: Jan 18, 1934, 89 y.o.   MRN: [id number] [facility] Surgery Progress Note     Subjective: CC-  NG accidentally came out over night. States that her abdomen is a little sore. No n/v. She started passing flatus, no BM.  Objective: Vital signs in last 24 hours: Temp:  [97.8 F (36.6 C)-99.1 F (37.3 C)] 98.5 F (36.9 C) (09/26 0805) Pulse Rate:  [67-75] 71 (09/26 0805) Resp:  [16-18] 16 (09/26 0805) BP: (113-138)/(63-79) 134/79 (09/26 0805) SpO2:  [93 %-97 %] 97 % (09/26 0805) Last BM Date : 04/02/23  Intake/Output from previous day: 09/25 0701 - 09/26 0700 In: 2170.4 [P.O.:250; I.V.:1680.4; NG/GT:240] Out: 400 [Emesis/NG output:400] Intake/Output this shift: No intake/output data recorded.  PE: Gen:  Alert, NAD, pleasant Abd: soft, nondistend, nontender, bowel sounds present  Lab Results:  Recent Labs    04/03/23 0827 04/04/23 0457  WBC 7.5 6.1  HGB 12.5 11.9*  HCT 39.9 37.6  PLT 199 150   BMET Recent Labs    04/03/23 0827 04/04/23 0457  NA 140 139  K 4.0 3.7  CL 108 112*  CO2 23 24  GLUCOSE 92 69*  BUN 18 18  CREATININE 0.90 0.73  CALCIUM 8.5* 8.2*   PT/INR Recent Labs    04/01/23 1406 04/02/23 1040  LABPROT  --  18.8*  INR 1.6* 1.6*   CMP     Component Value Date/Time   NA 139 04/04/2023 0457   NA 142 10/11/2016 1242   K 3.7 04/04/2023 0457   K 4.4 10/11/2016 1242   CL 112 (H) 04/04/2023 0457   CL 109 (H) 09/10/2012 1315   CO2 24 04/04/2023 0457   CO2 24 10/11/2016 1242   GLUCOSE 69 (L) 04/04/2023 0457   GLUCOSE 80 10/11/2016 1242   GLUCOSE 94 09/10/2012 1315   BUN 18 04/04/2023 0457   BUN 15.6 10/11/2016 1242   CREATININE 0.73 04/04/2023 0457   CREATININE 0.78 11/02/2021 1247   CREATININE 0.9 10/11/2016 1242   CALCIUM 8.2 (L) 04/04/2023 0457   CALCIUM 9.8 10/11/2016 1242   PROT 6.8 04/02/2023 0530   PROT 6.8 10/11/2016 1242   ALBUMIN 3.7 04/02/2023 0530   ALBUMIN 3.9 10/11/2016 1242    AST 23 04/02/2023 0530   AST 22 11/02/2021 1247   AST 20 10/11/2016 1242   ALT 13 04/02/2023 0530   ALT 14 11/02/2021 1247   ALT 10 10/11/2016 1242   ALKPHOS 84 04/02/2023 0530   ALKPHOS 103 10/11/2016 1242   BILITOT 0.6 04/02/2023 0530   BILITOT 0.4 11/02/2021 1247   BILITOT 0.78 10/11/2016 1242   GFRNONAA >60 04/04/2023 0457   GFRNONAA >60 11/02/2021 1247   GFRAA >60 01/19/2020 1120   GFRAA 54 (L) 01/01/2019 1500   Lipase     Component Value Date/Time   LIPASE 25 04/02/2023 0530       Studies/Results: DG Abd Portable 1V  Result Date: 04/04/2023 CLINICAL DATA:  Abdominal pain and distention. EXAM: PORTABLE ABDOMEN - 1 VIEW COMPARISON:  April 03, 2023. FINDINGS: The bowel gas pattern is normal. No significant stool burden is noted. No radio-opaque calculi or other significant radiographic abnormality are seen. IMPRESSION: No abnormal bowel dilatation. Electronically Signed   By: Lupita Raider M.D.   On: 04/04/2023 09:30   DG Abd Portable 1V-Small Bowel Obstruction Protocol-initial, 8 hr delay  Result Date: 04/03/2023 CLINICAL DATA:  Follow-up small bowel obstruction, 8 hour delay. EXAM: PORTABLE ABDOMEN -  1 VIEW COMPARISON:  Radiographs and CT yesterday FINDINGS: Enteric tube tip below the diaphragm in the stomach. There is no enteric contrast in the colon. Persistent air-filled loops of small bowel centrally, not abnormally dilated. Excreted IV contrast in the urinary bladder. IMPRESSION: Persistent air-filled loops of small bowel centrally, not abnormally dilated. No enteric contrast in the colon. Electronically Signed   By: Narda Rutherford M.D.   On: 04/03/2023 13:11   DG Abd 1 View  Result Date: 04/02/2023 CLINICAL DATA:  [id number] Encounter for imaging study to confirm nasogastric (NG) tube placement [id number] EXAM: ABDOMEN - 1 VIEW COMPARISON:  None Available. FINDINGS: Enteric tube courses below the diaphragm with tip and side port overlying the expected region the gastric  lumen. The bowel gas pattern is normal. No radio-opaque calculi or other significant radiographic abnormality are seen. Vascular clips overlie the left chest. IMPRESSION: Enteric tube in good position. Electronically Signed   By: Tish Frederickson M.D.   On: 04/02/2023 21:53   DG Abd Portable 1V-Small Bowel Protocol-Position Verification  Result Date: 04/02/2023 CLINICAL DATA:  NG tube EXAM: PORTABLE ABDOMEN - 1 VIEW COMPARISON:  CT abdomen and pelvis same day FINDINGS: Nasogastric tube tip is in the mid gastric body. Surgical clips are noted in the left chest. There is scoliosis of the thoracolumbar spine. IMPRESSION: Nasogastric tube tip is in the mid gastric body. Electronically Signed   By: Darliss Cheney M.D.   On: 04/02/2023 17:34    Anti-infectives: Anti-infectives (From admission, onward)    None        Assessment/Plan SBO vs enteritis  - No prior h/o abdominal surgery - CT scan 9/24 shows several thick walled distal small bowel loops with surrounding edema/inflammation concerning for enteritis; the small bowel loops proximal to this are mildly prominent and fluid-filled suggesting the possibility of low grade small bowel obstruction - NG came out over night. She is starting to pass flatus. Trial clear liquids today.    ID - none VTE - holding coumadin >> heparin gtt FEN - IVF, CLD Foley - none   Hx CVA, ASD on coumadin HTN HLD Glaucoma IBS  I reviewed last 24 h vitals and pain scores, last 48 h intake and output, last 24 h labs and trends, and last 24 h imaging results.    LOS: 1 day    Renee Adams, [facility] Surgery 04/04/2023, 9:48 AM Please see Amion for pager number during day hours 7:00am-4:30pm

## 2023-04-04 NOTE — Progress Notes (Signed)
ANTICOAGULATION CONSULT NOTE - Follow Up Consult  Pharmacy Consult for heparin Indication:  h/o CVA w/ PFO  Labs: Recent Labs     0000 04/01/23 1406 04/02/23 0530 04/02/23 1040 04/02/23 2141 04/03/23 0827 04/03/23 1740 04/04/23 0457  HGB   < >  --  14.7  --   --  12.5  --  11.9*  HCT  --   --  47.1*  --   --  39.9  --  37.6  PLT  --   --  229  --   --  199  --  150  LABPROT  --   --   --  18.8*  --   --   --   --   INR  --  1.6*  --  1.6*  --   --   --   --   HEPARINUNFRC  --   --   --   --    < > 0.76* 0.81* 0.53  CREATININE  --   --  0.84  --   --  0.90  --  0.73   < > = values in this interval not displayed.    Assessment/Plan:  89yo female therapeutic on heparin after rate change. Will continue infusion at current rate of 700 units/hr and confirm stable with additional level.  Vernard Gambles, PharmD, BCPS 04/04/2023 6:24 AM

## 2023-04-04 NOTE — Plan of Care (Signed)

## 2023-04-04 NOTE — Plan of Care (Signed)

## 2023-04-05 DIAGNOSIS — K529 Noninfective gastroenteritis and colitis, unspecified: Secondary | ICD-10-CM | POA: Diagnosis not present

## 2023-04-05 LAB — CBC
HCT: 36.7 % (ref 36.0–46.0)
Hemoglobin: 11.4 g/dL — ABNORMAL LOW (ref 12.0–15.0)
MCH: 28.1 pg (ref 26.0–34.0)
MCHC: 31.1 g/dL (ref 30.0–36.0)
MCV: 90.6 fL (ref 80.0–100.0)
Platelets: 154 10*3/uL (ref 150–400)
RBC: 4.05 MIL/uL (ref 3.87–5.11)
RDW: 14.5 % (ref 11.5–15.5)
WBC: 4.6 10*3/uL (ref 4.0–10.5)
nRBC: 0 % (ref 0.0–0.2)

## 2023-04-05 LAB — BASIC METABOLIC PANEL
Anion gap: 6 (ref 5–15)
BUN: 17 mg/dL (ref 8–23)
CO2: 20 mmol/L — ABNORMAL LOW (ref 22–32)
Calcium: 8.2 mg/dL — ABNORMAL LOW (ref 8.9–10.3)
Chloride: 113 mmol/L — ABNORMAL HIGH (ref 98–111)
Creatinine, Ser: 0.67 mg/dL (ref 0.44–1.00)
GFR, Estimated: >60 mL/min (ref >60)
Glucose, Bld: 72 mg/dL (ref 70–99)
Potassium: 3.8 mmol/L (ref 3.5–5.1)
Sodium: 139 mmol/L (ref 135–145)

## 2023-04-05 MED ORDER — FLUTICASONE PROPIONATE 50 MCG/ACT NA SUSP
2.0000 | Freq: Every day | NASAL | Status: DC | PRN
  Filled 2023-04-05: qty 16

## 2023-04-05 MED ORDER — LORATADINE 10 MG PO TABS
10.0000 mg | ORAL_TABLET | Freq: Every day | ORAL | Status: DC | PRN
  Administered 2023-04-05 – 2023-04-07 (×3): 10 mg via ORAL
  Filled 2023-04-05 (×3): qty 1

## 2023-04-05 MED ORDER — SODIUM CHLORIDE 0.9 % IV SOLN
1.0000 g | INTRAVENOUS | Status: AC
  Administered 2023-04-05 – 2023-04-07 (×3): 1 g via INTRAVENOUS
  Filled 2023-04-05 (×3): qty 10

## 2023-04-05 MED ORDER — SERTRALINE HCL 100 MG PO TABS
100.0000 mg | ORAL_TABLET | Freq: Every day | ORAL | Status: DC
  Administered 2023-04-05 – 2023-04-08 (×4): 100 mg via ORAL
  Filled 2023-04-05 (×4): qty 1

## 2023-04-05 NOTE — Care Management Important Message (Signed)
Important Message  Patient Details  Name: Renee Adams MRN: [id number] Date of Birth: Oct 10, 1933   Important Message Given:  Yes - Medicare IM     Dorena Bodo 04/05/2023, 3:10 PM

## 2023-04-05 NOTE — Progress Notes (Signed)
Patient ID: Renee Adams, female   DOB: 01-Jun-1934, 89 y.o.   MRN: [id number] [facility] Surgery Progress Note     Subjective: Tolerating PO.  No bowel movements.  Passing some flatus  Objective: Vital signs in last 24 hours: Temp:  [97.3 F (36.3 C)-98.4 F (36.9 C)] 98.4 F (36.9 C) (09/27 0728) Pulse Rate:  [64-74] 67 (09/27 0728) Resp:  [16-18] 16 (09/27 0728) BP: (115-148)/(67-76) 144/76 (09/27 0728) SpO2:  [96 %-99 %] 97 % (09/27 0728) Last BM Date : 04/02/23  Intake/Output from previous day: No intake/output data recorded. Intake/Output this shift: No intake/output data recorded.  PE: Gen:  Alert, NAD, pleasant Abd: soft, nondistend, nontender, bowel sounds present  Lab Results:  Recent Labs    04/04/23 0457 04/05/23 0658  WBC 6.1 4.6  HGB 11.9* 11.4*  HCT 37.6 36.7  PLT 150 154   BMET Recent Labs    04/04/23 0457 04/05/23 0658  NA 139 139  K 3.7 3.8  CL 112* 113*  CO2 24 20*  GLUCOSE 69* 72  BUN 18 17  CREATININE 0.73 0.67  CALCIUM 8.2* 8.2*   PT/INR Recent Labs    04/02/23 1040  LABPROT 18.8*  INR 1.6*   CMP     Component Value Date/Time   NA 139 04/05/2023 0658   NA 142 10/11/2016 1242   K 3.8 04/05/2023 0658   K 4.4 10/11/2016 1242   CL 113 (H) 04/05/2023 0658   CL 109 (H) 09/10/2012 1315   CO2 20 (L) 04/05/2023 0658   CO2 24 10/11/2016 1242   GLUCOSE 72 04/05/2023 0658   GLUCOSE 80 10/11/2016 1242   GLUCOSE 94 09/10/2012 1315   BUN 17 04/05/2023 0658   BUN 15.6 10/11/2016 1242   CREATININE 0.67 04/05/2023 0658   CREATININE 0.78 11/02/2021 1247   CREATININE 0.9 10/11/2016 1242   CALCIUM 8.2 (L) 04/05/2023 0658   CALCIUM 9.8 10/11/2016 1242   PROT 6.8 04/02/2023 0530   PROT 6.8 10/11/2016 1242   ALBUMIN 3.7 04/02/2023 0530   ALBUMIN 3.9 10/11/2016 1242   AST 23 04/02/2023 0530   AST 22 11/02/2021 1247   AST 20 10/11/2016 1242   ALT 13 04/02/2023 0530   ALT 14 11/02/2021 1247   ALT 10 10/11/2016 1242   ALKPHOS  84 04/02/2023 0530   ALKPHOS 103 10/11/2016 1242   BILITOT 0.6 04/02/2023 0530   BILITOT 0.4 11/02/2021 1247   BILITOT 0.78 10/11/2016 1242   GFRNONAA >60 04/05/2023 0658   GFRNONAA >60 11/02/2021 1247   GFRAA >60 01/19/2020 1120   GFRAA 54 (L) 01/01/2019 1500   Lipase     Component Value Date/Time   LIPASE 25 04/02/2023 0530       Studies/Results: DG Abd Portable 1V  Result Date: 04/04/2023 CLINICAL DATA:  Abdominal pain and distention. EXAM: PORTABLE ABDOMEN - 1 VIEW COMPARISON:  April 03, 2023. FINDINGS: The bowel gas pattern is normal. No significant stool burden is noted. No radio-opaque calculi or other significant radiographic abnormality are seen. IMPRESSION: No abnormal bowel dilatation. Electronically Signed   By: Lupita Raider M.D.   On: 04/04/2023 09:30    Anti-infectives: Anti-infectives (From admission, onward)    None        Assessment/Plan SBO vs enteritis  - No prior h/o abdominal surgery - CT scan 9/24 shows several thick walled distal small bowel loops with surrounding edema/inflammation concerning for enteritis; the small bowel loops proximal to this are mildly prominent and fluid-filled  suggesting the possibility of low grade small bowel obstruction - Advance diet as tolerated - Seems to be resolving, looks more like enteritis   ID - none VTE - holding coumadin >> heparin gtt FEN - IVF, Soft diet Foley - none   Hx CVA, ASD on coumadin HTN HLD Glaucoma IBS  I reviewed last 24 h vitals and pain scores, last 48 h intake and output, last 24 h labs and trends, and last 24 h imaging results.    LOS: 2 days    Quentin Ore, MD  [facility] Surgery 04/05/2023, 9:46 AM Please see Amion for pager number during day hours 7:00am-4:30pm

## 2023-04-05 NOTE — Progress Notes (Addendum)
PROGRESS NOTE    Renee Adams  MVH:[id number] DOB: [birth year] DOA: 04/02/2023 PCP: Irven Coe, MD    Brief Narrative:   Renee Adams is a 89 y.o. female with past medical history significant for HTN, CVA w/ PFO on coumadin, left breast cancer s/p lumpectomy 1997 in remission, GERD presenting to [facility] ED on 9/24 with complaints of abdominal pain.  Onset day prior to admission. Pain localized around her waist and lower abdomen; crampy in nature.  Associated with nausea and dry heaves.  Denies any history of of small bowel obstruction or previous abdominal surgery.  Recently had extraction of her upper molar teeth last week and has been on a Soft diet, completed antibiotics.   In the ED, temperature 99.0 F, HR 81, RR 18, BP 145/84, SpO2 90% on room air.  WBC 7.7, hemoglobin 14.7, platelet count 229.  Sodium 142, potassium 4.3, chloride 103, CO2 25, glucose 115, BUN 17, creatinine 0.84.  AST 23, ALT 13, total bilirubin 0.6.  Lipase 25.  COVID PCR negative.  CT Abdo/pelvis with several distal small loops of thick-walled with surrounding edema/inflammation concerning for enteritis, small bowel loops proximal to this are mildly prominent and fluid-filled suggesting of low-grade small bowel obstruction, left colonic diverticulosis.  Patient was administered antiemetics, IV fluids, Toradol and fentanyl.  General surgery consulted.  NG tube was recommended.  TRH consulted for admission for further evaluation and management of small bowel obstruction, enteritis.  Assessment & Plan:   Small bowel obstruction Enteritis Patient presenting to ED with 1 day history of crampy abdominal pain associated nausea and dry heaves.  No previous history of abdominal surgeries or small bowel obstructions in the past.  Patient is afebrile without leukocytosis.  Procalcitonin less than 0.10.  CRP 0.6, ESR 6. -- General Surgery following, appreciate assistance -- Full liquid diet advanced to soft diet today by general  surgery -- NS at 75 mL/h -- Toradol as needed pain control -- Antiemetics as needed -- Strict I's and O's  GNR urinary tract infection Urinalysis with large leukocytes, positive nitrite, many bacteria, greater than 50 WBCs. -- Urine Cx: >100k GNRs; further identification and susceptibilities pending. -- Ceftriaxone 1 g IV every 24 hours  Essential hypertension Currently not on antihypertensives outpatient.  Hx CVA/PFO -- Holding Coumadin -- Lovenox 70mg  [place] q12h until patient able to tolerate advance diet then will change back to Coumadin  Anxiety -- Xanax 0.25 mg p.o. every 4 hours as needed anxiety -- Restart home Zoloft 100 mg p.o. daily today.  Restless leg syndrome -- Requip 1mg  PO TID PRN  History of breast cancer Patient status post left lumpectomy back in 1997 and has been in remission.  Recent tooth extraction Patient with recent multiple upper molars extracted in preparation for dentures last week.  Patient reports completed outpatient course of oral amoxicillin.  GERD -- Protonix 40 mg IV every 24 hours   DVT prophylaxis: Heparin drip    Code Status: Full Code Family Communication: Updated family present at bedside this morning  Disposition Plan:  Level of care: Med-Surg Status is: Inpatient Remains inpatient appropriate because: Needs further advancement of diet with toleration and general surgery signed off for stable for discharge home      Consultants:  General surgery  Procedures:  NG tube placement  Antimicrobials:  None   Subjective: Patient seen examined bedside, resting calmly.  Lying in bed.  Family present at bedside.  Patient reports flatus but no bowel movement.  Tolerating full  liquid diet, further advance to soft diet today by general surgery.  No other specific questions or concerns at this time.  Denies headache, no visual changes, no chest pain, no palpitations, no shortness of breath, no fever/chills/night sweats, no  nausea/vomiting, no diarrhea, no focal weakness, no fatigue, no paresthesias.  No other acute events overnight per nursing staff.  Objective: Vitals:   04/04/23 2000 04/05/23 0048 04/05/23 0409 04/05/23 0728  BP:  125/69 (!) 148/69 (!) 144/76  Pulse:  74 71 67  Resp:  18 18 16   Temp: 98.3 F (36.8 C) 98.1 F (36.7 C)  98.4 F (36.9 C)  TempSrc:  Oral  Oral  SpO2:  98% 97% 97%  Weight:       No intake or output data in the 24 hours ending 04/05/23 1031  Filed Weights   04/02/23 1439 04/03/23 0600  Weight: 65.2 kg 66.2 kg    Examination:  Physical Exam: GEN: NAD, alert and oriented x 3, wd/wn HEENT: NCAT, PERRL, EOMI, sclera clear, MMM PULM: CTAB w/o wheezes/crackles, normal respiratory effort CV: RRR w/o M/G/R GI: abd soft, nondistended, NTTP TTP, + BS MSK: no peripheral edema, muscle strength globally intact 5/5 bilateral upper/lower extremities NEURO: CN II-XII intact, no focal deficits, sensation to light touch intact PSYCH: normal mood/affect Integumentary: dry/intact, no rashes or wounds    Data Reviewed: I have personally reviewed following labs and imaging studies  CBC: Recent Labs  Lab 04/02/23 0530 04/03/23 0827 04/04/23 0457 04/05/23 0658  WBC 7.7 7.5 6.1 4.6  NEUTROABS 6.0  --   --   --   HGB 14.7 12.5 11.9* 11.4*  HCT 47.1* 39.9 37.6 36.7  MCV 88.7 89.5 92.2 90.6  PLT 229 199 150 154   Basic Metabolic Panel: Recent Labs  Lab 04/02/23 0530 04/02/23 1040 04/03/23 0827 04/04/23 0457 04/05/23 0658  NA 142  --  140 139 139  K 4.3  --  4.0 3.7 3.8  CL 103  --  108 112* 113*  CO2 25  --  23 24 20*  GLUCOSE 115*  --  92 69* 72  BUN 17  --  18 18 17   CREATININE 0.84  --  0.90 0.73 0.67  CALCIUM 10.0  --  8.5* 8.2* 8.2*  MG  --  2.1  --  2.1  --    GFR: Estimated Creatinine Clearance: 46.1 mL/min (by C-G formula based on SCr of 0.67 mg/dL). Liver Function Tests: Recent Labs  Lab 04/02/23 0530  AST 23  ALT 13  ALKPHOS 84  BILITOT 0.6   PROT 6.8  ALBUMIN 3.7   Recent Labs  Lab 04/02/23 0530  LIPASE 25   No results for input(s): "AMMONIA" in the last 168 hours. Coagulation Profile: Recent Labs  Lab 04/01/23 1406 04/02/23 1040  INR 1.6* 1.6*   Cardiac Enzymes: No results for input(s): "CKTOTAL", "CKMB", "CKMBINDEX", "TROPONINI" in the last 168 hours. BNP (last 3 results) No results for input(s): "PROBNP" in the last 8760 hours. HbA1C: No results for input(s): "HGBA1C" in the last 72 hours. CBG: No results for input(s): "GLUCAP" in the last 168 hours. Lipid Profile: No results for input(s): "CHOL", "HDL", "LDLCALC", "TRIG", "CHOLHDL", "LDLDIRECT" in the last 72 hours. Thyroid Function Tests: No results for input(s): "TSH", "T4TOTAL", "FREET4", "T3FREE", "THYROIDAB" in the last 72 hours. Anemia Panel: No results for input(s): "VITAMINB12", "FOLATE", "FERRITIN", "TIBC", "IRON", "RETICCTPCT" in the last 72 hours. Sepsis Labs: Recent Labs  Lab 04/03/23 0827  PROCALCITON <0.10  Recent Results (from the past 240 hour(s))  SARS Coronavirus 2 by RT PCR (hospital order, performed in [facility] hospital lab) *cepheid single result test* Anterior Nasal Swab     Status: None   Collection Time: 04/02/23 10:14 AM   Specimen: Anterior Nasal Swab  Result Value Ref Range Status   SARS Coronavirus 2 by RT PCR NEGATIVE NEGATIVE Final    Comment: Performed at [facility], [address] [place], [place] [address]  Urine Culture (for pregnant, neutropenic or urologic patients or patients with an indwelling urinary catheter)     Status: Abnormal (Preliminary result)   Collection Time: 04/03/23  1:09 PM   Specimen: Urine, Clean Catch  Result Value Ref Range Status   Specimen Description URINE, CLEAN CATCH  Final   Special Requests   Final    NONE Performed at [facility], [address] [place], [place] [address]    Culture >=100,000 COLONIES/mL GRAM NEGATIVE RODS (A)  Final   Report Status PENDING   Incomplete         Radiology Studies: DG Abd Portable 1V  Result Date: 04/04/2023 CLINICAL DATA:  Abdominal pain and distention. EXAM: PORTABLE ABDOMEN - 1 VIEW COMPARISON:  April 03, 2023. FINDINGS: The bowel gas pattern is normal. No significant stool burden is noted. No radio-opaque calculi or other significant radiographic abnormality are seen. IMPRESSION: No abnormal bowel dilatation. Electronically Signed   By: Lupita Raider M.D.   On: 04/04/2023 09:30        Scheduled Meds:  enoxaparin (LOVENOX) injection  70 mg Subcutaneous Q12H   fentaNYL (SUBLIMAZE) injection  50 mcg Intravenous Once   pantoprazole (PROTONIX) IV  40 mg Intravenous Daily   sodium chloride flush  3 mL Intravenous Q12H   Continuous Infusions:  sodium chloride 75 mL/hr at 04/04/23 2204     LOS: 2 days    Time spent: 51 minutes spent on chart review, discussion with nursing staff, consultants, updating family and interview/physical exam; more than 50% of that time was spent in counseling and/or coordination of care.    Alvira Philips [place], DO [facility] Available via Epic secure chat 7am-7pm After these hours, please refer to coverage provider listed on [email or web address] 04/05/2023, 10:31 AM

## 2023-04-05 NOTE — Plan of Care (Signed)

## 2023-04-06 DIAGNOSIS — K529 Noninfective gastroenteritis and colitis, unspecified: Secondary | ICD-10-CM | POA: Diagnosis not present

## 2023-04-06 LAB — URINE CULTURE: Culture: >=100000 — AB

## 2023-04-06 LAB — CBC
HCT: 38.5 % (ref 36.0–46.0)
Hemoglobin: 12.5 g/dL (ref 12.0–15.0)
MCH: 28.4 pg (ref 26.0–34.0)
MCHC: 32.5 g/dL (ref 30.0–36.0)
MCV: 87.5 fL (ref 80.0–100.0)
Platelets: 176 10*3/uL (ref 150–400)
RBC: 4.4 MIL/uL (ref 3.87–5.11)
RDW: 14.5 % (ref 11.5–15.5)
WBC: 5.2 10*3/uL (ref 4.0–10.5)
nRBC: 0 % (ref 0.0–0.2)

## 2023-04-06 LAB — PROTIME-INR
INR: 2.1 — ABNORMAL HIGH (ref 0.8–1.2)
Prothrombin Time: 24 s — ABNORMAL HIGH (ref 11.4–15.2)

## 2023-04-06 MED ORDER — PANTOPRAZOLE SODIUM 40 MG PO TBEC
40.0000 mg | DELAYED_RELEASE_TABLET | Freq: Every day | ORAL | Status: DC
  Administered 2023-04-07 – 2023-04-08 (×2): 40 mg via ORAL
  Filled 2023-04-06 (×2): qty 1

## 2023-04-06 MED ORDER — WARFARIN SODIUM 1 MG PO TABS
1.0000 mg | ORAL_TABLET | Freq: Once | ORAL | Status: AC
  Administered 2023-04-07: 1 mg via ORAL
  Filled 2023-04-06: qty 1

## 2023-04-06 MED ORDER — WARFARIN SODIUM 2 MG PO TABS
2.0000 mg | ORAL_TABLET | Freq: Once | ORAL | Status: DC
  Filled 2023-04-06: qty 1

## 2023-04-06 MED ORDER — WARFARIN - PHARMACIST DOSING INPATIENT: Freq: Every day | Status: DC

## 2023-04-06 NOTE — Evaluation (Signed)
Physical Therapy Evaluation and Discharge Patient Details Name: Renee Adams MRN: [id number] DOB: 12-27-1933 Today's Date: 04/06/2023  History of Present Illness  89 y.o. female who presented 04/02/23 with complaints of abdominal pain. CT abd concerning for enteritis and possible SBO; NG tube;  PMH significant of hypertension, PAF on coumadin, CVA, possible PFO, left breast cancer s/p lumpectomy in 1997, and GERD  Clinical Impression   Patient evaluated by Physical Therapy with no further acute PT needs identified. Patient ambulated 80 ft with RW on RA with sats 92% (93% at rest in sitting afterwards). Will refer to Mobility Team in case pt remains hospitalized.  PT is signing off. Thank you for this referral.         If plan is discharge home, recommend the following: A little help with bathing/dressing/bathroom;Assistance with cooking/housework;Assist for transportation;Help with stairs or ramp for entrance   Can travel by private vehicle        Equipment Recommendations None recommended by PT  Recommendations for Other Services       Functional Status Assessment Patient has not had a recent decline in their functional status     Precautions / Restrictions Precautions Precautions: Fall      Mobility  Bed Mobility Overal bed mobility: Modified Independent             General bed mobility comments: incr time, use of rail as she has at home    Transfers Overall transfer level: Needs assistance Equipment used: Rolling walker (2 wheels) Transfers: Sit to/from Stand Sit to Stand: Contact guard assist, Min assist           General transfer comment: initially from bed with min assist; from [facility] and EOB later with CGA    Ambulation/Gait Ambulation/Gait assistance: Modified independent (Device/Increase time) Gait Distance (Feet): 80 Feet Assistive device: Rolling walker (2 wheels) Gait Pattern/deviations: Step-through pattern, Decreased stride length, Trunk  flexed   Gait velocity interpretation: 1.31 - 2.62 ft/sec, indicative of limited community ambulator   General Gait Details: pt reports slightly slower than usual and mild dyspnea (pt could perceive but not PT)  Stairs            Wheelchair Mobility     Tilt Bed    Modified Rankin (Stroke Patients Only)       Balance Overall balance assessment: Modified Independent                                           Pertinent Vitals/Pain Pain Assessment Pain Assessment: No/denies pain    Home Living Family/patient expects to be discharged to:: Private residence Living Arrangements: Alone Available Help at Discharge: Family;Available 24 hours/day (daughter plans to stay as needed) Type of Home: House Home Access: Stairs to enter Entrance Stairs-Rails: Right Entrance Stairs-Number of Steps: 1   Home Layout: Two level;Able to live on main level with bedroom/bathroom Home Equipment: Rolling Walker (2 wheels);Shower seat;Grab bars - tub/shower      Prior Function Prior Level of Function : Needs assist             Mobility Comments: uses rollator all the time ADLs Comments: modified independent wiht BADLs; son grocery shops, assist with housekeeping except doing dishes, reheats things     Extremity/Trunk Assessment   Upper Extremity Assessment Upper Extremity Assessment: Generalized weakness (RA arthritic joint changes)    Lower Extremity Assessment Lower  Extremity Assessment: Generalized weakness (RA arthritic joint changes)    Cervical / Trunk Assessment Cervical / Trunk Assessment: Kyphotic  Communication   Communication Communication: Hearing impairment  Cognition Arousal: Alert Behavior During Therapy: WFL for tasks assessed/performed Overall Cognitive Status: Within Functional Limits for tasks assessed                                          General Comments General comments (skin integrity, edema, etc.): Daughter  present and confirmed all information pt provided.    Exercises     Assessment/Plan    PT Assessment Patient does not need any further PT services  PT Problem List         PT Treatment Interventions      PT Goals (Current goals can be found in the Care Plan section)  Acute Rehab PT Goals PT Goal Formulation: All assessment and education complete, DC therapy    Frequency       Co-evaluation               AM-PAC PT "6 Clicks" Mobility  Outcome Measure Help needed turning from your back to your side while in a flat bed without using bedrails?: None Help needed moving from lying on your back to sitting on the side of a flat bed without using bedrails?: None Help needed moving to and from a bed to a chair (including a wheelchair)?: A Little Help needed standing up from a chair using your arms (e.g., wheelchair or bedside chair)?: A Little Help needed to walk in hospital room?: None Help needed climbing 3-5 steps with a railing? : A Little 6 Click Score: 21    End of Session   Activity Tolerance: Patient tolerated treatment well Patient left: in chair;with call bell/phone within reach;with family/visitor present Nurse Communication: Mobility status;Other (comment) (walked on RA; 93% at rest; RN ok with leaving O2 off; daughter ok to walk with pt) PT Visit Diagnosis: Other abnormalities of gait and mobility (R26.89)    Time: 0752-0827 PT Time Calculation (min) (ACUTE ONLY): 35 min   Charges:   PT Evaluation $PT Eval Low Complexity: 1 Low PT Treatments $Gait Training: 8-22 mins PT General Charges $$ ACUTE PT VISIT: 1 Visit          Jerolyn Center, PT Acute Rehabilitation Services  Office [phone]   Zena Amos 04/06/2023, 8:48 AM

## 2023-04-06 NOTE — Progress Notes (Signed)
Patient ID: Renee Adams, female   DOB: 1934-04-28, 89 y.o.   MRN: [id number] [facility] Surgery Progress Note     Subjective: Tolerating PO.  No bowel movement per pt.  Passing some flatus  Objective: Vital signs in last 24 hours: Temp:  [97.7 F (36.5 C)-98.5 F (36.9 C)] 98.5 F (36.9 C) (09/28 0745) Pulse Rate:  [65-77] 74 (09/28 0745) Resp:  [16-17] 16 (09/28 0745) BP: (130-172)/(63-92) 172/92 (09/28 0745) SpO2:  [93 %-96 %] 95 % (09/28 0745) Last BM Date : 04/02/23  Intake/Output from previous day: 09/27 0701 - 09/28 0700 In: 100 [IV Piggyback:100] Out: -  Intake/Output this shift: No intake/output data recorded.  PE: Gen:  Alert, NAD, pleasant Abd: soft, nondistend, nontender, bowel sounds present  Lab Results:  Recent Labs    04/04/23 0457 04/05/23 0658  WBC 6.1 4.6  HGB 11.9* 11.4*  HCT 37.6 36.7  PLT 150 154   BMET Recent Labs    04/04/23 0457 04/05/23 0658  NA 139 139  K 3.7 3.8  CL 112* 113*  CO2 24 20*  GLUCOSE 69* 72  BUN 18 17  CREATININE 0.73 0.67  CALCIUM 8.2* 8.2*   PT/INR No results for input(s): "LABPROT", "INR" in the last 72 hours.  CMP     Component Value Date/Time   NA 139 04/05/2023 0658   NA 142 10/11/2016 1242   K 3.8 04/05/2023 0658   K 4.4 10/11/2016 1242   CL 113 (H) 04/05/2023 0658   CL 109 (H) 09/10/2012 1315   CO2 20 (L) 04/05/2023 0658   CO2 24 10/11/2016 1242   GLUCOSE 72 04/05/2023 0658   GLUCOSE 80 10/11/2016 1242   GLUCOSE 94 09/10/2012 1315   BUN 17 04/05/2023 0658   BUN 15.6 10/11/2016 1242   CREATININE 0.67 04/05/2023 0658   CREATININE 0.78 11/02/2021 1247   CREATININE 0.9 10/11/2016 1242   CALCIUM 8.2 (L) 04/05/2023 0658   CALCIUM 9.8 10/11/2016 1242   PROT 6.8 04/02/2023 0530   PROT 6.8 10/11/2016 1242   ALBUMIN 3.7 04/02/2023 0530   ALBUMIN 3.9 10/11/2016 1242   AST 23 04/02/2023 0530   AST 22 11/02/2021 1247   AST 20 10/11/2016 1242   ALT 13 04/02/2023 0530   ALT 14 11/02/2021  1247   ALT 10 10/11/2016 1242   ALKPHOS 84 04/02/2023 0530   ALKPHOS 103 10/11/2016 1242   BILITOT 0.6 04/02/2023 0530   BILITOT 0.4 11/02/2021 1247   BILITOT 0.78 10/11/2016 1242   GFRNONAA >60 04/05/2023 0658   GFRNONAA >60 11/02/2021 1247   GFRAA >60 01/19/2020 1120   GFRAA 54 (L) 01/01/2019 1500   Lipase     Component Value Date/Time   LIPASE 25 04/02/2023 0530       Studies/Results: No results found.  Anti-infectives: Anti-infectives (From admission, onward)    Start     Dose/Rate Route Frequency Ordered Stop   04/05/23 1130  cefTRIAXone (ROCEPHIN) 1 g in sodium chloride 0.9 % 100 mL IVPB        1 g 200 mL/hr over 30 Minutes Intravenous Every 24 hours 04/05/23 1035 04/08/23 1129        Assessment/Plan SBO vs enteritis  - No prior h/o abdominal surgery - CT scan 9/24 shows several thick walled distal small bowel loops with surrounding edema/inflammation concerning for enteritis; the small bowel loops proximal to this are mildly prominent and fluid-filled suggesting the possibility of low grade small bowel obstruction - Advance diet as  tolerated - Seems to be resolving, looks more like enteritis. She is tolerating a soft diet with no nausea or pain at this point.    Surgery team will sign off- please call any time with questions or concerns.  ID - none VTE - holding coumadin >> heparin gtt FEN - IVF, Soft diet Foley - none   Hx CVA, ASD on coumadin HTN HLD Glaucoma IBS  I reviewed last 24 h vitals and pain scores, last 48 h intake and output, last 24 h labs and trends, and last 24 h imaging results.    LOS: 3 days    Berna Bue, MD  [facility] Surgery 04/06/2023, 8:09 AM Please see Amion for pager number during day hours 7:00am-4:30pm

## 2023-04-06 NOTE — Progress Notes (Signed)
PROGRESS NOTE    Renee Adams  ZOX:[id number] DOB: Sep 19, 1933 DOA: 04/02/2023 PCP: Irven Coe, MD    Brief Narrative:   Renee Adams is a 89 y.o. female with past medical history significant for HTN, CVA w/ PFO on coumadin, left breast cancer s/p lumpectomy 1997 in remission, GERD presenting to [facility] ED on 9/24 with complaints of abdominal pain.  Onset day prior to admission. Pain localized around her waist and lower abdomen; crampy in nature.  Associated with nausea and dry heaves.  Denies any history of of small bowel obstruction or previous abdominal surgery.  Recently had extraction of her upper molar teeth last week and has been on a Soft diet, completed antibiotics.   In the ED, temperature 99.0 F, HR 81, RR 18, BP 145/84, SpO2 90% on room air.  WBC 7.7, hemoglobin 14.7, platelet count 229.  Sodium 142, potassium 4.3, chloride 103, CO2 25, glucose 115, BUN 17, creatinine 0.84.  AST 23, ALT 13, total bilirubin 0.6.  Lipase 25.  COVID PCR negative.  CT Abdo/pelvis with several distal small loops of thick-walled with surrounding edema/inflammation concerning for enteritis, small bowel loops proximal to this are mildly prominent and fluid-filled suggesting of low-grade small bowel obstruction, left colonic diverticulosis.  Patient was administered antiemetics, IV fluids, Toradol and fentanyl.  General surgery consulted.  NG tube was recommended.  TRH consulted for admission for further evaluation and management of small bowel obstruction, enteritis.  Assessment & Plan:   Small bowel obstruction Enteritis Patient presenting to ED with 1 day history of crampy abdominal pain associated nausea and dry heaves.  No previous history of abdominal surgeries or small bowel obstructions in the past.  Patient is afebrile without leukocytosis.  Procalcitonin less than 0.10.  CRP 0.6, ESR 6.  Seen by general surgery and now signed off. -- soft diet today -- Toradol as needed pain control -- Antiemetics  as needed -- Strict I's and O's  E. coli urinary tract infection Urinalysis with large leukocytes, positive nitrite, many bacteria, greater than 50 WBCs.  Urine culture positive for E. coli. Ceftriaxone 1 g IV every 24 hours; plan 3-day course  Essential hypertension Currently not on antihypertensives outpatient.  Hx CVA/PFO -- Restart home Coumadin today  Anxiety -- Xanax 0.25 mg p.o. every 4 hours as needed anxiety -- Zoloft 100 mg p.o. daily   Restless leg syndrome -- Requip 1mg  PO TID PRN  History of breast cancer Patient status post left lumpectomy back in 1997 and has been in remission.  Recent tooth extraction Patient with recent multiple upper molars extracted in preparation for dentures last week.  Patient reports completed outpatient course of oral amoxicillin.  GERD -- Protonix 40 mg IV every 24 hours   DVT prophylaxis: Heparin drip    Code Status: Full Code Family Communication: Updated family present at bedside this morning  Disposition Plan:  Level of care: Med-Surg Status is: Inpatient Remains inpatient appropriate because: Anticipate discharge home tomorrow     Consultants:  General surgery  Procedures:  NG tube placement  Antimicrobials:  None   Subjective: Patient seen examined bedside, resting calmly.  Sitting in bedside chair.  Daughter present.  Reports small bowel movement earlier this morning.  Seen by general surgery and now signed off.  Daughter requesting further monitoring of oral intake today and improved bowel movements before ready for discharge home, anticipate tomorrow.  Daughter also requesting albuterol inhaler for discharge.  No other specific questions or concerns at this  time.   Denies headache, no visual changes, no chest pain, no palpitations, no shortness of breath, no fever/chills/night sweats, no nausea/vomiting, no diarrhea, no focal weakness, no fatigue, no paresthesias.  No other acute events overnight per nursing  staff.  Objective: Vitals:   04/05/23 2218 04/06/23 0210 04/06/23 0516 04/06/23 0745  BP: 130/63 137/84 (!) 152/78 (!) 172/92  Pulse: 77 70 76 74  Resp: 17 17 17 16   Temp: 97.7 F (36.5 C) 98.1 F (36.7 C) 98.3 F (36.8 C) 98.5 F (36.9 C)  TempSrc: Oral Oral Oral Oral  SpO2: 95% 96% 96% 95%  Weight:        Intake/Output Summary (Last 24 hours) at 04/06/2023 1023 Last data filed at 04/06/2023 0559 Gross per 24 hour  Intake 100 ml  Output --  Net 100 ml    Filed Weights   04/02/23 1439 04/03/23 0600  Weight: 65.2 kg 66.2 kg    Examination:  Physical Exam: GEN: NAD, alert and oriented x 3, wd/wn HEENT: NCAT, PERRL, EOMI, sclera clear, MMM PULM: CTAB w/o wheezes/crackles, normal respiratory effort, on room air CV: RRR w/o M/G/R GI: abd soft, nondistended, NTTP TTP, + BS MSK: no peripheral edema, muscle strength globally intact 5/5 bilateral upper/lower extremities NEURO: CN II-XII intact, no focal deficits, sensation to light touch intact PSYCH: normal mood/affect Integumentary: dry/intact, no rashes or wounds    Data Reviewed: I have personally reviewed following labs and imaging studies  CBC: Recent Labs  Lab 04/02/23 0530 04/03/23 0827 04/04/23 0457 04/05/23 0658 04/06/23 0905  WBC 7.7 7.5 6.1 4.6 5.2  NEUTROABS 6.0  --   --   --   --   HGB 14.7 12.5 11.9* 11.4* 12.5  HCT 47.1* 39.9 37.6 36.7 38.5  MCV 88.7 89.5 92.2 90.6 87.5  PLT 229 199 150 154 176   Basic Metabolic Panel: Recent Labs  Lab 04/02/23 0530 04/02/23 1040 04/03/23 0827 04/04/23 0457 04/05/23 0658  NA 142  --  140 139 139  K 4.3  --  4.0 3.7 3.8  CL 103  --  108 112* 113*  CO2 25  --  23 24 20*  GLUCOSE 115*  --  92 69* 72  BUN 17  --  18 18 17   CREATININE 0.84  --  0.90 0.73 0.67  CALCIUM 10.0  --  8.5* 8.2* 8.2*  MG  --  2.1  --  2.1  --    GFR: Estimated Creatinine Clearance: 46.1 mL/min (by C-G formula based on SCr of 0.67 mg/dL). Liver Function Tests: Recent Labs   Lab 04/02/23 0530  AST 23  ALT 13  ALKPHOS 84  BILITOT 0.6  PROT 6.8  ALBUMIN 3.7   Recent Labs  Lab 04/02/23 0530  LIPASE 25   No results for input(s): "AMMONIA" in the last 168 hours. Coagulation Profile: Recent Labs  Lab 04/01/23 1406 04/02/23 1040  INR 1.6* 1.6*   Cardiac Enzymes: No results for input(s): "CKTOTAL", "CKMB", "CKMBINDEX", "TROPONINI" in the last 168 hours. BNP (last 3 results) No results for input(s): "PROBNP" in the last 8760 hours. HbA1C: No results for input(s): "HGBA1C" in the last 72 hours. CBG: No results for input(s): "GLUCAP" in the last 168 hours. Lipid Profile: No results for input(s): "CHOL", "HDL", "LDLCALC", "TRIG", "CHOLHDL", "LDLDIRECT" in the last 72 hours. Thyroid Function Tests: No results for input(s): "TSH", "T4TOTAL", "FREET4", "T3FREE", "THYROIDAB" in the last 72 hours. Anemia Panel: No results for input(s): "VITAMINB12", "FOLATE", "FERRITIN", "TIBC", "IRON", "  RETICCTPCT" in the last 72 hours. Sepsis Labs: Recent Labs  Lab 04/03/23 0827  PROCALCITON <0.10    Recent Results (from the past 240 hour(s))  SARS Coronavirus 2 by RT PCR (hospital order, performed in [facility] hospital lab) *cepheid single result test* Anterior Nasal Swab     Status: None   Collection Time: 04/02/23 10:14 AM   Specimen: Anterior Nasal Swab  Result Value Ref Range Status   SARS Coronavirus 2 by RT PCR NEGATIVE NEGATIVE Final    Comment: Performed at [facility], [address] [place], [place] [address]  Urine Culture (for pregnant, neutropenic or urologic patients or patients with an indwelling urinary catheter)     Status: Abnormal   Collection Time: 04/03/23  1:09 PM   Specimen: Urine, Clean Catch  Result Value Ref Range Status   Specimen Description URINE, CLEAN CATCH  Final   Special Requests   Final    NONE Performed at [facility], [address] [place], [place] [address]    Culture >=100,000 COLONIES/mL ESCHERICHIA  COLI (A)  Final   Report Status 04/06/2023 FINAL  Final   Organism ID, Bacteria ESCHERICHIA COLI (A)  Final      Susceptibility   Escherichia coli - MIC*    AMPICILLIN >=32 RESISTANT Resistant     CEFAZOLIN <=4 SENSITIVE Sensitive     CEFEPIME <=0.12 SENSITIVE Sensitive     CEFTRIAXONE <=0.25 SENSITIVE Sensitive     CIPROFLOXACIN <=0.25 SENSITIVE Sensitive     GENTAMICIN <=1 SENSITIVE Sensitive     IMIPENEM <=0.25 SENSITIVE Sensitive     NITROFURANTOIN <=16 SENSITIVE Sensitive     TRIMETH/SULFA <=20 SENSITIVE Sensitive     AMPICILLIN/SULBACTAM 16 INTERMEDIATE Intermediate     PIP/TAZO <=4 SENSITIVE Sensitive     * >=100,000 COLONIES/mL ESCHERICHIA COLI         Radiology Studies: No results found.      Scheduled Meds:  enoxaparin (LOVENOX) injection  70 mg Subcutaneous Q12H   fentaNYL (SUBLIMAZE) injection  50 mcg Intravenous Once   pantoprazole (PROTONIX) IV  40 mg Intravenous Daily   sertraline  100 mg Oral Daily   sodium chloride flush  3 mL Intravenous Q12H   Continuous Infusions:  cefTRIAXone (ROCEPHIN)  IV Stopped (04/05/23 1147)     LOS: 3 days    Time spent: 51 minutes spent on chart review, discussion with nursing staff, consultants, updating family and interview/physical exam; more than 50% of that time was spent in counseling and/or coordination of care.    Renee Philips [place], DO [facility] Available via Epic secure chat 7am-7pm After these hours, please refer to coverage provider listed on [email or web address] 04/06/2023, 10:23 AM

## 2023-04-06 NOTE — Progress Notes (Addendum)
ANTICOAGULATION CONSULT NOTE- follow-up  Pharmacy Consult for re-initiation of warfarin with lovenox bridge Indication:  history of CVA w/ PFO  Allergies  Allergen Reactions   Dilaudid [Hydromorphone Hcl] Shortness Of Breath    Tolerates tramadol   Hydromorphone Other (See Comments)    UNKNOWN TO PATIENT   Morphine Other (See Comments)    UNKNOWN TO PATIENT   Morphine And Codeine Shortness Of Breath    Tolerates tramadol   Sulfamethoxazole Other (See Comments)    UNKNOWN TO PATIENT   Sulfa Antibiotics Other (See Comments)    "Made me drunk";  wobbly   Keflex [Cephalexin] Nausea Only   Levaquin [Levofloxacin] Hives and Rash    Patient Measurements: Weight: 66.2 kg (146 lb) Heparin Dosing Weight: 65.2 Kg  Vital Signs: Temp: 98.5 F (36.9 C) (09/28 0745) Temp Source: Oral (09/28 0745) BP: 172/92 (09/28 0745) Pulse Rate: 74 (09/28 0745)  Labs: Recent Labs    04/03/23 1740 04/04/23 0457 04/04/23 0457 04/04/23 1121 04/05/23 0658 04/06/23 0905  HGB  --  11.9*   < >  --  11.4* 12.5  HCT  --  37.6  --   --  36.7 38.5  PLT  --  150  --   --  154 176  HEPARINUNFRC 0.81* 0.53  --  0.48  --   --   CREATININE  --  0.73  --   --  0.67  --    < > = values in this interval not displayed.    Estimated Creatinine Clearance: 46.1 mL/min (by C-G formula based on SCr of 0.67 mg/dL).  Assessment: 20 YOF admitted w/ cc of abdominal pain. She has a history of CVA (1997 and 2004) in the setting of PFO. She is maintained on coumadin in the ambulatory care setting. Current INR is 1.6 (9/24 10:40). Patient initially started on a heparin infusion. While on heparin, patient experienced ongoing bleeding from IV site. Transition patient to lovenox 70 mg (~1 mg/kg) every 12 hours on 9/26 PM. Pharmacy now consulted to re-initiate warfarin with lovenox bridge.   PTA Warfarin Regimen: 3 mg Wednesdays, 2 mg every other day. Recent Outpatient INR has been variable, most recent INR 1.6 on 9/24. Last  dose of warfarin on 9/23 PM.   Notable DDI with warfarin: ropinirole may increase INR  9/28 PM: INR 2.1 today, unclear cause of elevated INR. Last dose of warfarin given PTA on 9/23.   Goal of Therapy:  Monitor platelets by anticoagulation protocol: Yes INR goal 2-3   Plan:  Warfarin 1 mg x1 Lovenox 70 mg subcutaneously every 12 hours CBC and INR daily Monitor for signs of bleeding  Enos Fling, PharmD PGY-1 Acute Care Pharmacy Resident 04/06/2023 10:20 AM

## 2023-04-07 ENCOUNTER — Inpatient Hospital Stay (HOSPITAL_COMMUNITY): Payer: Medicare Other

## 2023-04-07 DIAGNOSIS — K529 Noninfective gastroenteritis and colitis, unspecified: Secondary | ICD-10-CM | POA: Diagnosis not present

## 2023-04-07 LAB — CBC
HCT: 39.8 % (ref 36.0–46.0)
Hemoglobin: 13.1 g/dL (ref 12.0–15.0)
MCH: 28.6 pg (ref 26.0–34.0)
MCHC: 32.9 g/dL (ref 30.0–36.0)
MCV: 86.9 fL (ref 80.0–100.0)
Platelets: 183 10*3/uL (ref 150–400)
RBC: 4.58 MIL/uL (ref 3.87–5.11)
RDW: 14.6 % (ref 11.5–15.5)
WBC: 4.7 10*3/uL (ref 4.0–10.5)
nRBC: 0 % (ref 0.0–0.2)

## 2023-04-07 LAB — PROTIME-INR
INR: 1.6 — ABNORMAL HIGH (ref 0.8–1.2)
Prothrombin Time: 19.6 s — ABNORMAL HIGH (ref 11.4–15.2)

## 2023-04-07 MED ORDER — BISACODYL 10 MG RE SUPP: 10.0000 mg | Freq: Every day | RECTAL | Status: DC | PRN

## 2023-04-07 MED ORDER — BISACODYL 10 MG RE SUPP
10.0000 mg | Freq: Once | RECTAL | Status: DC
  Filled 2023-04-07: qty 1

## 2023-04-07 MED ORDER — DOCUSATE SODIUM 100 MG PO CAPS
100.0000 mg | ORAL_CAPSULE | Freq: Two times a day (BID) | ORAL | Status: DC
  Administered 2023-04-07 – 2023-04-08 (×3): 100 mg via ORAL
  Filled 2023-04-07 (×3): qty 1

## 2023-04-07 MED ORDER — POLYETHYLENE GLYCOL 3350 17 G PO PACK
17.0000 g | PACK | Freq: Every day | ORAL | Status: DC | PRN
  Administered 2023-04-07: 17 g via ORAL
  Filled 2023-04-07: qty 1

## 2023-04-07 MED ORDER — FLUTICASONE PROPIONATE 50 MCG/ACT NA SUSP
2.0000 | Freq: Every day | NASAL | Status: DC
  Administered 2023-04-07 – 2023-04-08 (×2): 2 via NASAL
  Filled 2023-04-07: qty 16

## 2023-04-07 NOTE — Progress Notes (Signed)
Patient ID: Renee Adams, female   DOB: October 30, 1933, 89 y.o.   MRN: [id number] [facility] Surgery Progress Note     Subjective: No acute change.  No bowel movements recorded.  Objective: Vital signs in last 24 hours: Temp:  [98.1 F (36.7 C)-98.3 F (36.8 C)] 98.3 F (36.8 C) (09/29 0752) Pulse Rate:  [72-81] 81 (09/29 0752) Resp:  [16-17] 17 (09/29 0752) BP: (134-174)/(72-93) 174/93 (09/29 0752) SpO2:  [90 %-94 %] 92 % (09/29 0752) Last BM Date : 04/02/23  Intake/Output from previous day: 09/28 0701 - 09/29 0700 In: 250 [P.O.:250] Out: 300 [Urine:300] Intake/Output this shift: No intake/output data recorded.  PE: Gen: Patient is sleeping comfortably Abd: soft, nondistend, nontender  Lab Results:  Recent Labs    04/05/23 0658 04/06/23 0905  WBC 4.6 5.2  HGB 11.4* 12.5  HCT 36.7 38.5  PLT 154 176   BMET Recent Labs    04/05/23 0658  NA 139  K 3.8  CL 113*  CO2 20*  GLUCOSE 72  BUN 17  CREATININE 0.67  CALCIUM 8.2*   PT/INR Recent Labs    04/06/23 1113  LABPROT 24.0*  INR 2.1*    CMP     Component Value Date/Time   NA 139 04/05/2023 0658   NA 142 10/11/2016 1242   K 3.8 04/05/2023 0658   K 4.4 10/11/2016 1242   CL 113 (H) 04/05/2023 0658   CL 109 (H) 09/10/2012 1315   CO2 20 (L) 04/05/2023 0658   CO2 24 10/11/2016 1242   GLUCOSE 72 04/05/2023 0658   GLUCOSE 80 10/11/2016 1242   GLUCOSE 94 09/10/2012 1315   BUN 17 04/05/2023 0658   BUN 15.6 10/11/2016 1242   CREATININE 0.67 04/05/2023 0658   CREATININE 0.78 11/02/2021 1247   CREATININE 0.9 10/11/2016 1242   CALCIUM 8.2 (L) 04/05/2023 0658   CALCIUM 9.8 10/11/2016 1242   PROT 6.8 04/02/2023 0530   PROT 6.8 10/11/2016 1242   ALBUMIN 3.7 04/02/2023 0530   ALBUMIN 3.9 10/11/2016 1242   AST 23 04/02/2023 0530   AST 22 11/02/2021 1247   AST 20 10/11/2016 1242   ALT 13 04/02/2023 0530   ALT 14 11/02/2021 1247   ALT 10 10/11/2016 1242   ALKPHOS 84 04/02/2023 0530   ALKPHOS 103  10/11/2016 1242   BILITOT 0.6 04/02/2023 0530   BILITOT 0.4 11/02/2021 1247   BILITOT 0.78 10/11/2016 1242   GFRNONAA >60 04/05/2023 0658   GFRNONAA >60 11/02/2021 1247   GFRAA >60 01/19/2020 1120   GFRAA 54 (L) 01/01/2019 1500   Lipase     Component Value Date/Time   LIPASE 25 04/02/2023 0530       Studies/Results: No results found.  Anti-infectives: Anti-infectives (From admission, onward)    Start     Dose/Rate Route Frequency Ordered Stop   04/05/23 1130  cefTRIAXone (ROCEPHIN) 1 g in sodium chloride 0.9 % 100 mL IVPB        1 g 200 mL/hr over 30 Minutes Intravenous Every 24 hours 04/05/23 1035 04/08/23 1129        Assessment/Plan SBO vs enteritis  - No prior h/o abdominal surgery - CT scan 9/24 shows several thick walled distal small bowel loops with surrounding edema/inflammation concerning for enteritis; the small bowel loops proximal to this are mildly prominent and fluid-filled suggesting the possibility of low grade small bowel obstruction - Seems to be resolving, looks more like enteritis. She is tolerating a soft diet with no nausea  or pain at this point.  -Will add stool softener and as needed suppository   Surgery team will sign off- please call any time with questions or concerns.  ID - none VTE -  coumadin >> heparin gtt FEN - IVF, Soft diet Foley - none   Hx CVA, ASD on coumadin HTN HLD Glaucoma IBS  I reviewed last 24 h vitals and pain scores, last 48 h intake and output, last 24 h labs and trends, and last 24 h imaging results.    LOS: 4 days    Berna Bue, MD  [facility] Surgery 04/07/2023, 8:24 AM Please see Amion for pager number during day hours 7:00am-4:30pm

## 2023-04-07 NOTE — Plan of Care (Signed)
°  Problem: Education: Goal: Knowledge of General Education information will improve Description: Including pain rating scale, medication(s)/side effects and non-pharmacologic comfort measures Outcome: Progressing   Problem: Health Behavior/Discharge Planning: Goal: Ability to manage health-related needs will improve Outcome: Progressing   Problem: Clinical Measurements: Goal: Will remain free from infection Outcome: Progressing   Problem: Activity: Goal: Risk for activity intolerance will decrease Outcome: Progressing   Problem: Nutrition: Goal: Adequate nutrition will be maintained Outcome: Progressing   Problem: Coping: Goal: Level of anxiety will decrease Outcome: Progressing   Problem: Elimination: Goal: Will not experience complications related to bowel motility Outcome: Progressing   Problem: Elimination: Goal: Will not experience complications related to urinary retention Outcome: Progressing   Problem: Pain Managment: Goal: General experience of comfort will improve Outcome: Progressing   Problem: Safety: Goal: Ability to remain free from injury will improve Outcome: Progressing   Problem: Skin Integrity: Goal: Risk for impaired skin integrity will decrease Outcome: Progressing

## 2023-04-07 NOTE — Progress Notes (Signed)
PROGRESS NOTE    RICARDA KLESS  ZOX:[id number] DOB: Jun 26, 1934 DOA: 04/02/2023 PCP: Irven Coe, MD    Brief Narrative:   Renee Adams is a 89 y.o. female with past medical history significant for HTN, CVA w/ PFO on coumadin, left breast cancer s/p lumpectomy 1997 in remission, GERD presenting to [facility] ED on 9/24 with complaints of abdominal pain.  Onset day prior to admission. Pain localized around her waist and lower abdomen; crampy in nature.  Associated with nausea and dry heaves.  Denies any history of of small bowel obstruction or previous abdominal surgery.  Recently had extraction of her upper molar teeth last week and has been on a Soft diet, completed antibiotics.   In the ED, temperature 99.0 F, HR 81, RR 18, BP 145/84, SpO2 90% on room air.  WBC 7.7, hemoglobin 14.7, platelet count 229.  Sodium 142, potassium 4.3, chloride 103, CO2 25, glucose 115, BUN 17, creatinine 0.84.  AST 23, ALT 13, total bilirubin 0.6.  Lipase 25.  COVID PCR negative.  CT Abdo/pelvis with several distal small loops of thick-walled with surrounding edema/inflammation concerning for enteritis, small bowel loops proximal to this are mildly prominent and fluid-filled suggesting of low-grade small bowel obstruction, left colonic diverticulosis.  Patient was administered antiemetics, IV fluids, Toradol and fentanyl.  General surgery consulted.  NG tube was recommended.  TRH consulted for admission for further evaluation and management of small bowel obstruction, enteritis.  Assessment & Plan:   Small bowel obstruction Enteritis Patient presenting to ED with 1 day history of crampy abdominal pain associated nausea and dry heaves.  No previous history of abdominal surgeries or small bowel obstructions in the past.  Patient is afebrile without leukocytosis.  Procalcitonin less than 0.10.  CRP 0.6, ESR 6.  Seen by general surgery and now signed off. -- soft diet today -- Toradol as needed pain control -- Antiemetics  as needed -- Strict I's and O's  E. coli urinary tract infection Urinalysis with large leukocytes, positive nitrite, many bacteria, greater than 50 WBCs.  Urine culture positive for E. coli. Ceftriaxone 1 g IV every 24 hours; plan 3-day course  Shortness of breath/cough: Patient complaining of shortness of breath, cough, congestion, left-sided ear pain.  Examination of the left external otic canal with no concerning findings to suggest active infection and tympanic membrane appears normal in appearance.  Chest x-ray with poor inspiratory effort but no concern for focal consolidation. -- Continue Flonase/Claritin  Essential hypertension Currently not on antihypertensives outpatient.  Hx CVA/PFO -- Restart home Coumadin today  Anxiety -- Xanax 0.25 mg p.o. every 4 hours as needed anxiety -- Zoloft 100 mg p.o. daily   Restless leg syndrome -- Requip 1mg  PO TID PRN  History of breast cancer Patient status post left lumpectomy back in 1997 and has been in remission.  Recent tooth extraction Patient with recent multiple upper molars extracted in preparation for dentures last week.  Patient reports completed outpatient course of oral amoxicillin.  GERD -- Protonix 40 mg IV every 24 hours   DVT prophylaxis: Heparin drip warfarin (COUMADIN) tablet 1 mg    Code Status: Full Code Family Communication: Updated family present at bedside this morning  Disposition Plan:  Level of care: Med-Surg Status is: Inpatient Remains inpatient appropriate because: Anticipate discharge home tomorrow     Consultants:  General surgery  Procedures:  NG tube placement  Antimicrobials:  None   Subjective: Patient seen examined bedside, resting calmly.  Lying in  bed.  Daughter present.  Continues with flatus, no bowel movement.  Seen by general surgery, signed off.  Started on stool softener.  Patient complaining of shortness of breath, cough, congestion, left ear pain.  Examination of left  ear unrevealing.  Obtain chest x-ray with no focal consolidation.   No other specific questions or concerns at this time.   Denies headache, no visual changes, no chest pain, no palpitations, no shortness of breath, no fever/chills/night sweats, no nausea/vomiting, no diarrhea, no focal weakness, no fatigue, no paresthesias.  No other acute events overnight per nursing staff.  Objective: Vitals:   04/06/23 1515 04/06/23 2044 04/07/23 0421 04/07/23 0752  BP: 134/72 (!) 148/81 (!) 149/79 (!) 174/93  Pulse: 72 76 73 81  Resp: 17 16 16 17   Temp: 98.1 F (36.7 C) 98.2 F (36.8 C) 98.2 F (36.8 C) 98.3 F (36.8 C)  TempSrc: Oral Oral Oral Oral  SpO2: 91% 94% 90% 92%  Weight:        Intake/Output Summary (Last 24 hours) at 04/07/2023 1114 Last data filed at 04/06/2023 1900 Gross per 24 hour  Intake 100 ml  Output 300 ml  Net -200 ml    Filed Weights   04/02/23 1439 04/03/23 0600  Weight: 65.2 kg 66.2 kg    Examination:  Physical Exam: GEN: NAD, alert and oriented x 3, wd/wn HEENT: NCAT, PERRL, EOMI, sclera clear, MMM, Right EAU with mild wax buildup, no erythema/purulence, tympanic membrane clear, left EACU with no erythema/fluctuance, tympanic membrane clear PULM: CTAB w/o wheezes/crackles, normal respiratory effort, on room air CV: RRR w/o M/G/R GI: abd soft, nondistended, NTTP TTP, + BS MSK: no peripheral edema, muscle strength globally intact 5/5 bilateral upper/lower extremities NEURO: CN II-XII intact, no focal deficits, sensation to light touch intact PSYCH: normal mood/affect Integumentary: dry/intact, no rashes or wounds    Data Reviewed: I have personally reviewed following labs and imaging studies  CBC: Recent Labs  Lab 04/02/23 0530 04/03/23 0827 04/04/23 0457 04/05/23 0658 04/06/23 0905  WBC 7.7 7.5 6.1 4.6 5.2  NEUTROABS 6.0  --   --   --   --   HGB 14.7 12.5 11.9* 11.4* 12.5  HCT 47.1* 39.9 37.6 36.7 38.5  MCV 88.7 89.5 92.2 90.6 87.5  PLT 229 199  150 154 176   Basic Metabolic Panel: Recent Labs  Lab 04/02/23 0530 04/02/23 1040 04/03/23 0827 04/04/23 0457 04/05/23 0658  NA 142  --  140 139 139  K 4.3  --  4.0 3.7 3.8  CL 103  --  108 112* 113*  CO2 25  --  23 24 20*  GLUCOSE 115*  --  92 69* 72  BUN 17  --  18 18 17   CREATININE 0.84  --  0.90 0.73 0.67  CALCIUM 10.0  --  8.5* 8.2* 8.2*  MG  --  2.1  --  2.1  --    GFR: Estimated Creatinine Clearance: 46.1 mL/min (by C-G formula based on SCr of 0.67 mg/dL). Liver Function Tests: Recent Labs  Lab 04/02/23 0530  AST 23  ALT 13  ALKPHOS 84  BILITOT 0.6  PROT 6.8  ALBUMIN 3.7   Recent Labs  Lab 04/02/23 0530  LIPASE 25   No results for input(s): "AMMONIA" in the last 168 hours. Coagulation Profile: Recent Labs  Lab 04/01/23 1406 04/02/23 1040 04/06/23 1113  INR 1.6* 1.6* 2.1*   Cardiac Enzymes: No results for input(s): "CKTOTAL", "CKMB", "CKMBINDEX", "TROPONINI" in the last  168 hours. BNP (last 3 results) No results for input(s): "PROBNP" in the last 8760 hours. HbA1C: No results for input(s): "HGBA1C" in the last 72 hours. CBG: No results for input(s): "GLUCAP" in the last 168 hours. Lipid Profile: No results for input(s): "CHOL", "HDL", "LDLCALC", "TRIG", "CHOLHDL", "LDLDIRECT" in the last 72 hours. Thyroid Function Tests: No results for input(s): "TSH", "T4TOTAL", "FREET4", "T3FREE", "THYROIDAB" in the last 72 hours. Anemia Panel: No results for input(s): "VITAMINB12", "FOLATE", "FERRITIN", "TIBC", "IRON", "RETICCTPCT" in the last 72 hours. Sepsis Labs: Recent Labs  Lab 04/03/23 0827  PROCALCITON <0.10    Recent Results (from the past 240 hour(s))  SARS Coronavirus 2 by RT PCR (hospital order, performed in [facility] hospital lab) *cepheid single result test* Anterior Nasal Swab     Status: None   Collection Time: 04/02/23 10:14 AM   Specimen: Anterior Nasal Swab  Result Value Ref Range Status   SARS Coronavirus 2 by RT PCR NEGATIVE  NEGATIVE Final    Comment: Performed at [facility], [address], [place], [place] [address]  Urine Culture (for pregnant, neutropenic or urologic patients or patients with an indwelling urinary catheter)     Status: Abnormal   Collection Time: 04/03/23  1:09 PM   Specimen: Urine, Clean Catch  Result Value Ref Range Status   Specimen Description URINE, CLEAN CATCH  Final   Special Requests   Final    NONE Performed at [facility], [address], [place], [place] [address]    Culture >=100,000 COLONIES/mL ESCHERICHIA COLI (A)  Final   Report Status 04/06/2023 FINAL  Final   Organism ID, Bacteria ESCHERICHIA COLI (A)  Final      Susceptibility   Escherichia coli - MIC*    AMPICILLIN >=32 RESISTANT Resistant     CEFAZOLIN <=4 SENSITIVE Sensitive     CEFEPIME <=0.12 SENSITIVE Sensitive     CEFTRIAXONE <=0.25 SENSITIVE Sensitive     CIPROFLOXACIN <=0.25 SENSITIVE Sensitive     GENTAMICIN <=1 SENSITIVE Sensitive     IMIPENEM <=0.25 SENSITIVE Sensitive     NITROFURANTOIN <=16 SENSITIVE Sensitive     TRIMETH/SULFA <=20 SENSITIVE Sensitive     AMPICILLIN/SULBACTAM 16 INTERMEDIATE Intermediate     PIP/TAZO <=4 SENSITIVE Sensitive     * >=100,000 COLONIES/mL ESCHERICHIA COLI         Radiology Studies: No results found.      Scheduled Meds:  bisacodyl  10 mg Rectal Once   docusate sodium  100 mg Oral BID   enoxaparin (LOVENOX) injection  70 mg Subcutaneous Q12H   fentaNYL (SUBLIMAZE) injection  50 mcg Intravenous Once   fluticasone  2 spray Each Nare Daily   pantoprazole  40 mg Oral Daily   sertraline  100 mg Oral Daily   sodium chloride flush  3 mL Intravenous Q12H   warfarin  1 mg Oral ONCE-1600   Warfarin - Pharmacist Dosing Inpatient   Does not apply q1600   Continuous Infusions:  cefTRIAXone (ROCEPHIN)  IV 1 g (04/06/23 1222)     LOS: 4 days    Time spent: 51 minutes spent on chart review, discussion with nursing staff, consultants, updating  family and interview/physical exam; more than 50% of that time was spent in counseling and/or coordination of care.    Alvira Philips [place], DO [facility] Available via Epic secure chat 7am-7pm After these hours, please refer to coverage provider listed on [email or web address] 04/07/2023, 11:14 AM

## 2023-04-07 NOTE — Progress Notes (Addendum)
ANTICOAGULATION CONSULT NOTE- follow-up  Pharmacy Consult for re-initiation of warfarin Indication:  history of CVA w/ PFO  Allergies  Allergen Reactions   Dilaudid [Hydromorphone Hcl] Shortness Of Breath    Tolerates tramadol   Hydromorphone Other (See Comments)    UNKNOWN TO PATIENT   Morphine Other (See Comments)    UNKNOWN TO PATIENT   Morphine And Codeine Shortness Of Breath    Tolerates tramadol   Sulfamethoxazole Other (See Comments)    UNKNOWN TO PATIENT   Sulfa Antibiotics Other (See Comments)    "Made me drunk";  wobbly   Keflex [Cephalexin] Nausea Only   Levaquin [Levofloxacin] Hives and Rash    Patient Measurements: Weight: 66.2 kg (146 lb) Heparin Dosing Weight: 65.2 Kg  Vital Signs: Temp: 98.3 F (36.8 C) (09/29 0752) Temp Source: Oral (09/29 0752) BP: 122/69 (09/29 1255) Pulse Rate: 80 (09/29 1255)  Labs: Recent Labs    04/05/23 0658 04/06/23 0905 04/06/23 1113 04/07/23 1124  HGB 11.4* 12.5  --  13.1  HCT 36.7 38.5  --  39.8  PLT 154 176  --  183  LABPROT  --   --  24.0* 19.6*  INR  --   --  2.1* 1.6*  CREATININE 0.67  --   --   --     Estimated Creatinine Clearance: 46.1 mL/min (by C-G formula based on SCr of 0.67 mg/dL).  Assessment: 13 YOF admitted w/ cc of abdominal pain. She has a history of CVA (1997 and 2004) in the setting of PFO. She is maintained on coumadin in the ambulatory care setting. Current INR is 1.6 (9/24 10:40). Patient initially started on a heparin infusion. While on heparin, patient experienced ongoing bleeding from IV site. Transition patient to lovenox 70 mg (~1 mg/kg) every 12 hours on 9/26 PM. Pharmacy now consulted to re-initiate warfarin with lovenox bridge.   PTA Warfarin Regimen: 3 mg Wednesdays, 2 mg every other day. Recent Outpatient INR has been variable, most recent INR 1.6 on 9/24. Last dose of warfarin on 9/23 PM.   Notable DDI with warfarin: ropinirole may increase INR  9/29: INR 1.6, subtherapeutic today.  Unclear why INR was elevated to 2.1 yesterday. Did not receive dose of warfarin yesterday. CBC stable (Hgb 13.1, Plt 183). Will stop lovenox today per MD.   Goal of Therapy:  Monitor platelets by anticoagulation protocol: Yes INR goal 2-3   Plan:  Warfarin 1 mg x1 CBC and INR daily Monitor for signs of bleeding  Enos Fling, PharmD PGY-1 Acute Care Pharmacy Resident 04/07/2023 1:00 PM

## 2023-04-07 NOTE — Plan of Care (Signed)

## 2023-04-08 DIAGNOSIS — K529 Noninfective gastroenteritis and colitis, unspecified: Secondary | ICD-10-CM | POA: Diagnosis not present

## 2023-04-08 LAB — BASIC METABOLIC PANEL
Anion gap: 6 (ref 5–15)
BUN: 7 mg/dL — ABNORMAL LOW (ref 8–23)
CO2: 26 mmol/L (ref 22–32)
Calcium: 8.8 mg/dL — ABNORMAL LOW (ref 8.9–10.3)
Chloride: 108 mmol/L (ref 98–111)
Creatinine, Ser: 0.94 mg/dL (ref 0.44–1.00)
GFR, Estimated: 59 mL/min — ABNORMAL LOW (ref >60)
Glucose, Bld: 79 mg/dL (ref 70–99)
Potassium: 3.3 mmol/L — ABNORMAL LOW (ref 3.5–5.1)
Sodium: 140 mmol/L (ref 135–145)

## 2023-04-08 LAB — CBC
HCT: 37.9 % (ref 36.0–46.0)
Hemoglobin: 12.4 g/dL (ref 12.0–15.0)
MCH: 28.6 pg (ref 26.0–34.0)
MCHC: 32.7 g/dL (ref 30.0–36.0)
MCV: 87.3 fL (ref 80.0–100.0)
Platelets: 176 10*3/uL (ref 150–400)
RBC: 4.34 MIL/uL (ref 3.87–5.11)
RDW: 14.7 % (ref 11.5–15.5)
WBC: 4.1 10*3/uL (ref 4.0–10.5)
nRBC: 0 % (ref 0.0–0.2)

## 2023-04-08 LAB — MAGNESIUM: Magnesium: 1.8 mg/dL (ref 1.7–2.4)

## 2023-04-08 MED ORDER — FLUTICASONE PROPIONATE 50 MCG/ACT NA SUSP: 2.0000 | Freq: Every day | NASAL | Status: AC

## 2023-04-08 MED ORDER — LORATADINE 10 MG PO TABS: 10.0000 mg | ORAL_TABLET | Freq: Every day | ORAL | Status: AC | PRN

## 2023-04-08 MED ORDER — ALBUTEROL SULFATE HFA 108 (90 BASE) MCG/ACT IN AERS: 2.0000 | INHALATION_SPRAY | Freq: Four times a day (QID) | RESPIRATORY_TRACT | 2 refills | Status: AC | PRN

## 2023-04-08 NOTE — Plan of Care (Signed)

## 2023-04-08 NOTE — Discharge Summary (Signed)
Physician Discharge Summary  Renee Adams OVF:[id number] DOB: 1933/09/01 DOA: 04/02/2023  PCP: Irven Coe, MD  Admit date: 04/02/2023 Discharge date: 04/08/2023  Admitted From: Home Disposition: Home  Recommendations for Outpatient Follow-up:  Follow up with PCP in 1-2 weeks   Home Health: No Equipment/Devices: None  Discharge Condition: Stable CODE STATUS: Full code Diet recommendation: Soft diet  History of present illness:  Renee Adams is a 89 y.o. female with past medical history significant for HTN, CVA w/ PFO on coumadin, left breast cancer s/p lumpectomy 1997 in remission, GERD presenting to [facility] ED on 9/24 with complaints of abdominal pain.  Onset day prior to admission. Pain localized around her waist and lower abdomen; crampy in nature.  Associated with nausea and dry heaves.  Denies any history of of small bowel obstruction or previous abdominal surgery.  Recently had extraction of her upper molar teeth last week and has been on a Soft diet, completed antibiotics.    In the ED, temperature 99.0 F, HR 81, RR 18, BP 145/84, SpO2 90% on room air.  WBC 7.7, hemoglobin 14.7, platelet count 229.  Sodium 142, potassium 4.3, chloride 103, CO2 25, glucose 115, BUN 17, creatinine 0.84.  AST 23, ALT 13, total bilirubin 0.6.  Lipase 25.  COVID PCR negative.  CT Abdo/pelvis with several distal small loops of thick-walled with surrounding edema/inflammation concerning for enteritis, small bowel loops proximal to this are mildly prominent and fluid-filled suggesting of low-grade small bowel obstruction, left colonic diverticulosis.  Patient was administered antiemetics, IV fluids, Toradol and fentanyl.  General surgery consulted.  NG tube was recommended.  TRH consulted for admission for further evaluation and management of small bowel obstruction, enteritis.  Hospital course:  Small bowel obstruction Enteritis Patient presenting to ED with 1 day history of crampy abdominal pain  associated nausea and dry heaves.  No previous history of abdominal surgeries or small bowel obstructions in the past.  Patient is afebrile without leukocytosis.  Procalcitonin less than 0.10.  CRP 0.6, ESR 6.  Seen by general surgery; diet slowly advanced with toleration.     E. coli urinary tract infection Urinalysis with large leukocytes, positive nitrite, many bacteria, greater than 50 WBCs.  Urine culture positive for E. coli. Needed 3-day course of ceftriaxone.   Shortness of breath/cough: Patient complaining of shortness of breath, cough, congestion, left-sided ear pain.  Examination of the left external otic canal with no concerning findings to suggest active infection and tympanic membrane appears normal in appearance.  Chest x-ray with poor inspiratory effort but no concern for focal consolidation.  Continue Flonase and Claritin as needed.  Albuterol MDI for shortness of breath/wheezing.   Essential hypertension Currently not on antihypertensives outpatient.   Hx CVA/PFO Continue Coumadin.  Outpatient follow-up with Coumadin clinic.   Anxiety Continue Zoloft and Xanax   Restless leg syndrome Continue Requip    History of breast cancer Patient status post left lumpectomy back in 1997 and has been in remission.   Recent tooth extraction Patient with recent multiple upper molars extracted in preparation for dentures last week.  Patient reports completed outpatient course of oral amoxicillin.  Outpatient follow-up with dental medicine.    Discharge Diagnoses:  Principal Problem:   Enteritis Active Problems:   SBO (small bowel obstruction) (HCC)   Atrial fibrillation (HCC)   Anticoagulated   History of breast cancer   Anxiety    Discharge Instructions  Discharge Instructions     Call MD for:  difficulty  breathing, headache or visual disturbances   Complete by: As directed    Call MD for:  extreme fatigue   Complete by: As directed    Call MD for:  persistant  dizziness or light-headedness   Complete by: As directed    Call MD for:  persistant nausea and vomiting   Complete by: As directed    Call MD for:  severe uncontrolled pain   Complete by: As directed    Call MD for:  temperature >100.4   Complete by: As directed    Diet - low sodium heart healthy   Complete by: As directed    Increase activity slowly   Complete by: As directed       Allergies as of 04/08/2023       Reactions   Dilaudid [hydromorphone Hcl] Shortness Of Breath   Tolerates tramadol   Hydromorphone Other (See Comments)   UNKNOWN TO PATIENT   Morphine Other (See Comments)   UNKNOWN TO PATIENT   Morphine And Codeine Shortness Of Breath   Tolerates tramadol   Sulfamethoxazole Other (See Comments)   UNKNOWN TO PATIENT   Sulfa Antibiotics Other (See Comments)   "Made me drunk";  wobbly   Keflex [cephalexin] Nausea Only   Levaquin [levofloxacin] Hives, Rash        Medication List     TAKE these medications    acetaminophen 500 MG tablet Commonly known as: TYLENOL Take 1 tablet (500 mg total) by mouth every 8 (eight) hours as needed. What changed: reasons to take this   albuterol 108 (90 Base) MCG/ACT inhaler Commonly known as: VENTOLIN HFA Inhale 2 puffs into the lungs every 6 (six) hours as needed for wheezing or shortness of breath.   ALPRAZolam 0.25 MG tablet Commonly known as: XANAX Take 4 tablets (1 mg total) by mouth every 4 (four) hours as needed for anxiety.   fluticasone 50 MCG/ACT nasal spray Commonly known as: FLONASE Place 2 sprays into both nostrils daily. Start taking on: April 09, 2023   ketoconazole 2 % cream Commonly known as: NIZORAL APPLY TO AFFECTED AREA ON THE SKIN DAILY AS NEEDED FOR IRRITATION   loratadine 10 MG tablet Commonly known as: CLARITIN Take 1 tablet (10 mg total) by mouth daily as needed for allergies or rhinitis.   multivitamin with minerals Tabs tablet Take 1 tablet by mouth daily.   rOPINIRole 1 MG  tablet Commonly known as: REQUIP Take 1 tablet (1 mg total) by mouth at bedtime as needed. For restless legs What changed:  reasons to take this additional instructions   sertraline 100 MG tablet Commonly known as: ZOLOFT Take 1.5 tablets (150 mg total) by mouth daily. What changed: how much to take   traMADol 50 MG tablet Commonly known as: ULTRAM Take 100 mg by mouth daily as needed (Finger pain).   VITAMIN D PO Take 1 tablet by mouth daily.   warfarin 2 MG tablet Commonly known as: COUMADIN Take as directed. If you are unsure how to take this medication, talk to your nurse or doctor. Original instructions: TAKE 1 TO 1 AND 1/2 TABLETS BY MOUTH DAILY AS DIRECTED BY THE COUMADIN CLINIC What changed:  how much to take how to take this when to take this additional instructions        Follow-up Information     Irven Coe, MD. Schedule an appointment as soon as possible for a visit in 1 week(s).   Specialty: Family Medicine Contact information: [address] [place] [address] [phone]                Allergies  Allergen Reactions   Dilaudid [Hydromorphone Hcl] Shortness Of Breath    Tolerates tramadol   Hydromorphone Other (See Comments)    UNKNOWN TO PATIENT   Morphine Other (See Comments)    UNKNOWN TO PATIENT   Morphine And Codeine Shortness Of Breath    Tolerates tramadol   Sulfamethoxazole Other (See Comments)    UNKNOWN TO PATIENT   Sulfa Antibiotics Other (See Comments)    "Made me drunk";  wobbly   Keflex [Cephalexin] Nausea Only   Levaquin [Levofloxacin] Hives and Rash    Consultations: General surgery   Procedures/Studies: DG CHEST PORT 1 VIEW  Result Date: 04/07/2023 CLINICAL DATA:  Shortness of breath.  Reflux disease. EXAM: PORTABLE CHEST 1 VIEW COMPARISON:  03/28/2012 FINDINGS: Heart size is normal. The aorta is tortuous. Lung volumes are diminished since 2013 which could relate to a poor inspiration or  restrictive lung disease. Lung markings are more prominent than seen previously, possibly indicating fibrosis. No sign of acute consolidation, lobar collapse or pleural effusion. No acute bone finding. Surgical clips in the left chest as seen previously. IMPRESSION: 1. Diminished lung volumes since 2013. Increased prominence of lung markings, possibly indicating fibrosis. No sign of acute consolidation or lobar collapse. 2. Tortuous aorta. Electronically Signed   By: Paulina Fusi M.D.   On: 04/07/2023 13:27   DG Abd Portable 1V  Result Date: 04/04/2023 CLINICAL DATA:  Abdominal pain and distention. EXAM: PORTABLE ABDOMEN - 1 VIEW COMPARISON:  April 03, 2023. FINDINGS: The bowel gas pattern is normal. No significant stool burden is noted. No radio-opaque calculi or other significant radiographic abnormality are seen. IMPRESSION: No abnormal bowel dilatation. Electronically Signed   By: Lupita Raider M.D.   On: 04/04/2023 09:30   DG Abd Portable 1V-Small Bowel Obstruction Protocol-initial, 8 hr delay  Result Date: 04/03/2023 CLINICAL DATA:  Follow-up small bowel obstruction, 8 hour delay. EXAM: PORTABLE ABDOMEN - 1 VIEW COMPARISON:  Radiographs and CT yesterday FINDINGS: Enteric tube tip below the diaphragm in the stomach. There is no enteric contrast in the colon. Persistent air-filled loops of small bowel centrally, not abnormally dilated. Excreted IV contrast in the urinary bladder. IMPRESSION: Persistent air-filled loops of small bowel centrally, not abnormally dilated. No enteric contrast in the colon. Electronically Signed   By: Narda Rutherford M.D.   On: 04/03/2023 13:11   DG Abd 1 View  Result Date: 04/02/2023 CLINICAL DATA:  [id number] Encounter for imaging study to confirm nasogastric (NG) tube placement [id number] EXAM: ABDOMEN - 1 VIEW COMPARISON:  None Available. FINDINGS: Enteric tube courses below the diaphragm with tip and side port overlying the expected region the gastric lumen. The bowel  gas pattern is normal. No radio-opaque calculi or other significant radiographic abnormality are seen. Vascular clips overlie the left chest. IMPRESSION: Enteric tube in good position. Electronically Signed   By: Tish Frederickson M.D.   On: 04/02/2023 21:53   DG Abd Portable 1V-Small Bowel Protocol-Position Verification  Result Date: 04/02/2023 CLINICAL DATA:  NG tube EXAM: PORTABLE ABDOMEN - 1 VIEW COMPARISON:  CT abdomen and pelvis same day FINDINGS: Nasogastric tube tip is in the mid gastric body. Surgical clips are noted in the left chest. There is scoliosis of the thoracolumbar spine. IMPRESSION: Nasogastric tube tip is in the mid gastric body. Electronically Signed   By: Mcneil Sober.D.  On: 04/02/2023 17:34   CT ABDOMEN PELVIS W CONTRAST  Result Date: 04/02/2023 CLINICAL DATA:  Nausea, vomiting EXAM: CT ABDOMEN AND PELVIS WITH CONTRAST TECHNIQUE: Multidetector CT imaging of the abdomen and pelvis was performed using the standard protocol following bolus administration of intravenous contrast. RADIATION DOSE REDUCTION: This exam was performed according to the departmental dose-optimization program which includes automated exposure control, adjustment of the mA and/or kV according to patient size and/or use of iterative reconstruction technique. CONTRAST:  75mL OMNIPAQUE IOHEXOL 350 MG/ML SOLN COMPARISON:  None Available. FINDINGS: Lower chest: Linear scarring in the lung bases. No effusions. Dense mitral valve annular calcifications. Hepatobiliary: No focal hepatic abnormality. Gallbladder unremarkable. Pancreas: No focal abnormality or ductal dilatation. Spleen: No focal abnormality.  Normal size. Adrenals/Urinary Tract: No adrenal abnormality. No suspicious focal renal abnormality. No stones or hydronephrosis. Urinary bladder obscured by beam hardening artifact from bilateral hip replacements. Stomach/Bowel: Left colonic diverticulosis. No active diverticulitis. Abnormal appearing distal small  bowel loops with thick walls and surrounding edema. Findings concerning for enteritis. Proximal to these loops, the small bowel is mildly prominent and fluid-filled suggesting the possibility of low grade small bowel obstruction. Vascular/Lymphatic: Aortoiliac atherosclerosis. No evidence of aneurysm or adenopathy. Reproductive: Obscured by beam hardening artifact from bilateral hip replacements. Other: Small amount of free fluid in the pelvis. No free air. Small umbilical hernia containing fat. Musculoskeletal: No acute bony abnormality. IMPRESSION: Several distal small bowel loops appear thick walled with surrounding edema/inflammation concerning for enteritis. The small bowel loops proximal to this are mildly prominent and fluid-filled suggesting the possibility of low grade small bowel obstruction. Left colonic diverticulosis. Aortic atherosclerosis. Electronically Signed   By: Charlett Nose M.D.   On: 04/02/2023 09:37     Subjective: Patient seen examined bedside, resting calmly.  Lying in bed.  Daughter present.  Feels "much better" this morning.  Ready for discharge home.  Continues to tolerate diet.  Completed antibiotic course for UTI.  No other specific questions or concerns at this time.  Denies headache, no chest pain, no shortness of breath, no abdominal pain, no fever/chills/night sweats, no nausea/vomiting/diarrhea, no focal weakness, no fatigue, no paresthesias.  No acute events overnight per nursing staff.  Discharge Exam: Vitals:   04/08/23 0515 04/08/23 0843  BP: (!) 142/83 (!) 155/98  Pulse: 75 93  Resp: 19 17  Temp: 97.7 F (36.5 C) 98.3 F (36.8 C)  SpO2: 91% 93%   Vitals:   04/07/23 1512 04/07/23 2039 04/08/23 0515 04/08/23 0843  BP: 122/81 (!) 145/78 (!) 142/83 (!) 155/98  Pulse: 76 75 75 93  Resp: 17 19 19 17   Temp: 98 F (36.7 C) 97.8 F (36.6 C) 97.7 F (36.5 C) 98.3 F (36.8 C)  TempSrc:    Oral  SpO2: 90% 92% 91% 93%  Weight:        Physical Exam: GEN:  NAD, alert and oriented x 3, elderly in appearance, hard of hearing HEENT: NCAT, PERRL, EOMI, sclera clear, MMM PULM: CTAB w/o wheezes/crackles, normal respiratory effort on room air CV: RRR w/o M/G/R GI: abd soft, NTND, NABS, no R/G/M MSK: no peripheral edema, muscle strength globally intact 5/5 bilateral upper/lower extremities NEURO: No focal neurological deficits PSYCH: normal mood/affect Integumentary: No concerning rashes/lesions/wounds noted on exposed skin surfaces.    The results of significant diagnostics from this hospitalization (including imaging, microbiology, ancillary and laboratory) are listed below for reference.     Microbiology: Recent Results (from the past 240 hour(s))  SARS Coronavirus  2 by RT PCR (hospital order, performed in [facility] hospital lab) *cepheid single result test* Anterior Nasal Swab     Status: None   Collection Time: 04/02/23 10:14 AM   Specimen: Anterior Nasal Swab  Result Value Ref Range Status   SARS Coronavirus 2 by RT PCR NEGATIVE NEGATIVE Final    Comment: Performed at [facility] Lab, [address] [place], [place] [address]  Urine Culture (for pregnant, neutropenic or urologic patients or patients with an indwelling urinary catheter)     Status: Abnormal   Collection Time: 04/03/23  1:09 PM   Specimen: Urine, Clean Catch  Result Value Ref Range Status   Specimen Description URINE, CLEAN CATCH  Final   Special Requests   Final    NONE Performed at [facility], [address], [place], [place] [address]    Culture >=100,000 COLONIES/mL ESCHERICHIA COLI (A)  Final   Report Status 04/06/2023 FINAL  Final   Organism ID, Bacteria ESCHERICHIA COLI (A)  Final      Susceptibility   Escherichia coli - MIC*    AMPICILLIN >=32 RESISTANT Resistant     CEFAZOLIN <=4 SENSITIVE Sensitive     CEFEPIME <=0.12 SENSITIVE Sensitive     CEFTRIAXONE <=0.25 SENSITIVE Sensitive     CIPROFLOXACIN <=0.25 SENSITIVE Sensitive     GENTAMICIN  <=1 SENSITIVE Sensitive     IMIPENEM <=0.25 SENSITIVE Sensitive     NITROFURANTOIN <=16 SENSITIVE Sensitive     TRIMETH/SULFA <=20 SENSITIVE Sensitive     AMPICILLIN/SULBACTAM 16 INTERMEDIATE Intermediate     PIP/TAZO <=4 SENSITIVE Sensitive     * >=100,000 COLONIES/mL ESCHERICHIA COLI     Labs: BNP (last 3 results) No results for input(s): "BNP" in the last 8760 hours. Basic Metabolic Panel: Recent Labs  Lab 04/02/23 0530 04/02/23 1040 04/03/23 0827 04/04/23 0457 04/05/23 0658 04/08/23 0724  NA 142  --  140 139 139 140  K 4.3  --  4.0 3.7 3.8 3.3*  CL 103  --  108 112* 113* 108  CO2 25  --  23 24 20* 26  GLUCOSE 115*  --  92 69* 72 79  BUN 17  --  18 18 17  7*  CREATININE 0.84  --  0.90 0.73 0.67 0.94  CALCIUM 10.0  --  8.5* 8.2* 8.2* 8.8*  MG  --  2.1  --  2.1  --  1.8   Liver Function Tests: Recent Labs  Lab 04/02/23 0530  AST 23  ALT 13  ALKPHOS 84  BILITOT 0.6  PROT 6.8  ALBUMIN 3.7   Recent Labs  Lab 04/02/23 0530  LIPASE 25   No results for input(s): "AMMONIA" in the last 168 hours. CBC: Recent Labs  Lab 04/02/23 0530 04/03/23 0827 04/04/23 0457 04/05/23 0658 04/06/23 0905 04/07/23 1124 04/08/23 0724  WBC 7.7   < > 6.1 4.6 5.2 4.7 4.1  NEUTROABS 6.0  --   --   --   --   --   --   HGB 14.7   < > 11.9* 11.4* 12.5 13.1 12.4  HCT 47.1*   < > 37.6 36.7 38.5 39.8 37.9  MCV 88.7   < > 92.2 90.6 87.5 86.9 87.3  PLT 229   < > 150 154 176 183 176   < > = values in this interval not displayed.   Cardiac Enzymes: No results for input(s): "CKTOTAL", "CKMB", "CKMBINDEX", "TROPONINI" in the last 168 hours. BNP: Invalid input(s): "  POCBNP" CBG: No results for input(s): "GLUCAP" in the last 168 hours. D-Dimer No results for input(s): "DDIMER" in the last 72 hours. Hgb A1c No results for input(s): "HGBA1C" in the last 72 hours. Lipid Profile No results for input(s): "CHOL", "HDL", "LDLCALC", "TRIG", "CHOLHDL", "LDLDIRECT" in the last 72 hours. Thyroid  function studies No results for input(s): "TSH", "T4TOTAL", "T3FREE", "THYROIDAB" in the last 72 hours.  Invalid input(s): "FREET3" Anemia work up No results for input(s): "VITAMINB12", "FOLATE", "FERRITIN", "TIBC", "IRON", "RETICCTPCT" in the last 72 hours. Urinalysis    Component Value Date/Time   COLORURINE AMBER (A) 04/03/2023 1309   APPEARANCEUR CLOUDY (A) 04/03/2023 1309   LABSPEC 1.040 (H) 04/03/2023 1309   LABSPEC 1.020 04/17/2006 1216   PHURINE 5.0 04/03/2023 1309   GLUCOSEU NEGATIVE 04/03/2023 1309   HGBUR NEGATIVE 04/03/2023 1309   BILIRUBINUR NEGATIVE 04/03/2023 1309   BILIRUBINUR Negative 04/17/2006 1216   KETONESUR NEGATIVE 04/03/2023 1309   PROTEINUR 30 (A) 04/03/2023 1309   UROBILINOGEN 0.2 12/03/2012 2214   NITRITE POSITIVE (A) 04/03/2023 1309   LEUKOCYTESUR LARGE (A) 04/03/2023 1309   LEUKOCYTESUR Negative 04/17/2006 1216   Sepsis Labs Recent Labs  Lab 04/05/23 0658 04/06/23 0905 04/07/23 1124 04/08/23 0724  WBC 4.6 5.2 4.7 4.1   Microbiology Recent Results (from the past 240 hour(s))  SARS Coronavirus 2 by RT PCR (hospital order, performed in [facility] hospital lab) *cepheid single result test* Anterior Nasal Swab     Status: None   Collection Time: 04/02/23 10:14 AM   Specimen: Anterior Nasal Swab  Result Value Ref Range Status   SARS Coronavirus 2 by RT PCR NEGATIVE NEGATIVE Final    Comment: Performed at [facility], [address], [place], [place] [address]  Urine Culture (for pregnant, neutropenic or urologic patients or patients with an indwelling urinary catheter)     Status: Abnormal   Collection Time: 04/03/23  1:09 PM   Specimen: Urine, Clean Catch  Result Value Ref Range Status   Specimen Description URINE, CLEAN CATCH  Final   Special Requests   Final    NONE Performed at [facility] Lab, [address], [place], [place] [address]    Culture >=100,000 COLONIES/mL ESCHERICHIA COLI (A)  Final   Report Status 04/06/2023  FINAL  Final   Organism ID, Bacteria ESCHERICHIA COLI (A)  Final      Susceptibility   Escherichia coli - MIC*    AMPICILLIN >=32 RESISTANT Resistant     CEFAZOLIN <=4 SENSITIVE Sensitive     CEFEPIME <=0.12 SENSITIVE Sensitive     CEFTRIAXONE <=0.25 SENSITIVE Sensitive     CIPROFLOXACIN <=0.25 SENSITIVE Sensitive     GENTAMICIN <=1 SENSITIVE Sensitive     IMIPENEM <=0.25 SENSITIVE Sensitive     NITROFURANTOIN <=16 SENSITIVE Sensitive     TRIMETH/SULFA <=20 SENSITIVE Sensitive     AMPICILLIN/SULBACTAM 16 INTERMEDIATE Intermediate     PIP/TAZO <=4 SENSITIVE Sensitive     * >=100,000 COLONIES/mL ESCHERICHIA COLI     Time coordinating discharge: Over 30 minutes  SIGNED:   Alvira Philips [place], DO  [facility] 04/08/2023, 9:47 AM

## 2023-04-08 NOTE — Progress Notes (Signed)
Patient and daughter verbalized understanding of instructions.all belongings given to daughter.

## 2023-04-15 DIAGNOSIS — Z23 Encounter for immunization: Secondary | ICD-10-CM | POA: Diagnosis not present

## 2023-04-15 DIAGNOSIS — K529 Noninfective gastroenteritis and colitis, unspecified: Secondary | ICD-10-CM | POA: Diagnosis not present

## 2023-04-15 DIAGNOSIS — K56609 Unspecified intestinal obstruction, unspecified as to partial versus complete obstruction: Secondary | ICD-10-CM | POA: Diagnosis not present

## 2023-04-15 DIAGNOSIS — Z8719 Personal history of other diseases of the digestive system: Secondary | ICD-10-CM | POA: Diagnosis not present

## 2023-04-15 DIAGNOSIS — E559 Vitamin D deficiency, unspecified: Secondary | ICD-10-CM | POA: Diagnosis not present

## 2023-04-15 DIAGNOSIS — E7800 Pure hypercholesterolemia, unspecified: Secondary | ICD-10-CM | POA: Diagnosis not present

## 2023-04-15 DIAGNOSIS — Q211 Atrial septal defect, unspecified: Secondary | ICD-10-CM | POA: Diagnosis not present

## 2023-04-15 DIAGNOSIS — E876 Hypokalemia: Secondary | ICD-10-CM | POA: Diagnosis not present

## 2023-04-15 DIAGNOSIS — Z8673 Personal history of transient ischemic attack (TIA), and cerebral infarction without residual deficits: Secondary | ICD-10-CM | POA: Diagnosis not present

## 2023-04-15 LAB — PROTIME-INR: INR: 1.6 — AB (ref 0.80–1.20)

## 2023-04-16 ENCOUNTER — Ambulatory Visit (INDEPENDENT_AMBULATORY_CARE_PROVIDER_SITE_OTHER): Payer: Self-pay | Admitting: *Deleted

## 2023-04-16 ENCOUNTER — Ambulatory Visit: Payer: Medicare Other

## 2023-04-16 DIAGNOSIS — I480 Paroxysmal atrial fibrillation: Secondary | ICD-10-CM | POA: Diagnosis not present

## 2023-04-16 DIAGNOSIS — Z5181 Encounter for therapeutic drug level monitoring: Secondary | ICD-10-CM

## 2023-04-16 NOTE — Patient Instructions (Signed)
Description   Spoke with pt and advised pt to take 1.5 tablets of warfarin today only then continue taking warfarin 1 tablet daily, except 1.5 tablets on Wednesdays. Drinks 1 boost per day.  Recheck INR 1 week.  Procedure Fax for Cardiac Clearance #[phone]  Anticoagulation Clinic [phone]

## 2023-04-24 ENCOUNTER — Ambulatory Visit: Payer: Medicare Other | Attending: Interventional Cardiology

## 2023-04-24 DIAGNOSIS — I480 Paroxysmal atrial fibrillation: Secondary | ICD-10-CM | POA: Insufficient documentation

## 2023-04-24 DIAGNOSIS — Z5181 Encounter for therapeutic drug level monitoring: Secondary | ICD-10-CM | POA: Insufficient documentation

## 2023-04-24 LAB — POCT INR: INR: 2.4 (ref 2.0–3.0)

## 2023-04-24 NOTE — Patient Instructions (Signed)
Description   Continue taking warfarin 1 tablet daily, except 1.5 tablets on Wednesdays. Drinks 2 boost per day.  Recheck INR 2 weeks.  Procedure Fax for Cardiac Clearance #[phone]  Anticoagulation Clinic [phone]

## 2023-05-08 ENCOUNTER — Ambulatory Visit: Payer: Medicare Other

## 2023-05-13 ENCOUNTER — Telehealth: Payer: Self-pay

## 2023-05-13 NOTE — Telephone Encounter (Signed)
I spoke with Pam from [facility] office who said that there is no need to check INR prior to her extractions on 11/5 since the patient has held her Coumadin since 10/31-11/4.

## 2023-05-21 ENCOUNTER — Ambulatory Visit: Payer: Medicare Other

## 2023-05-23 ENCOUNTER — Ambulatory Visit: Payer: Medicare Other | Attending: Interventional Cardiology

## 2023-06-03 ENCOUNTER — Ambulatory Visit: Payer: Medicare Other | Attending: Cardiology

## 2023-06-03 DIAGNOSIS — I480 Paroxysmal atrial fibrillation: Secondary | ICD-10-CM | POA: Diagnosis not present

## 2023-06-03 DIAGNOSIS — Z5181 Encounter for therapeutic drug level monitoring: Secondary | ICD-10-CM | POA: Diagnosis not present

## 2023-06-03 LAB — POCT INR: INR: 1.4 — AB (ref 2.0–3.0)

## 2023-06-03 NOTE — Patient Instructions (Signed)
TAKE 2 TABLETS TONIGHT ONLY THEN Continue taking warfarin 1 tablet daily, except 1.5 tablets on Wednesdays. Drinks 2 boost per day.  Recheck INR 3 weeks.  Procedure Fax for Cardiac Clearance #[phone]  Anticoagulation Clinic [phone]

## 2023-06-24 ENCOUNTER — Ambulatory Visit: Payer: Medicare Other

## 2023-06-24 ENCOUNTER — Telehealth: Payer: Self-pay | Admitting: Podiatry

## 2023-06-24 NOTE — Telephone Encounter (Signed)
Pt left message Friday 12/13 @ 447pm stating she has seen Dr Al Corpus and Clois Comber and is needing an appt.  I left message for pt to call to get an appt scheduled.

## 2023-07-08 ENCOUNTER — Ambulatory Visit: Payer: Medicare Other

## 2023-07-08 ENCOUNTER — Telehealth: Payer: Self-pay

## 2023-07-08 NOTE — Telephone Encounter (Signed)
Lpmtcb to reschedule INR appt

## 2023-07-20 ENCOUNTER — Other Ambulatory Visit: Payer: Self-pay

## 2023-07-20 ENCOUNTER — Emergency Department (HOSPITAL_COMMUNITY): Payer: Medicare Other

## 2023-07-20 ENCOUNTER — Encounter (HOSPITAL_COMMUNITY): Payer: Self-pay

## 2023-07-20 ENCOUNTER — Observation Stay (HOSPITAL_COMMUNITY): Payer: Medicare Other

## 2023-07-20 ENCOUNTER — Inpatient Hospital Stay (HOSPITAL_COMMUNITY)
Admission: EM | Admit: 2023-07-20 | Discharge: 2023-07-24 | DRG: 948 | Disposition: A | Discharge status: Skilled Nursing Facility | Payer: Medicare Other | Attending: Family Medicine | Admitting: Family Medicine

## 2023-07-20 DIAGNOSIS — Z1152 Encounter for screening for COVID-19: Secondary | ICD-10-CM

## 2023-07-20 DIAGNOSIS — M25521 Pain in right elbow: Secondary | ICD-10-CM | POA: Diagnosis not present

## 2023-07-20 DIAGNOSIS — H9190 Unspecified hearing loss, unspecified ear: Secondary | ICD-10-CM | POA: Diagnosis not present

## 2023-07-20 DIAGNOSIS — R29818 Other symptoms and signs involving the nervous system: Secondary | ICD-10-CM | POA: Diagnosis not present

## 2023-07-20 DIAGNOSIS — M25511 Pain in right shoulder: Secondary | ICD-10-CM | POA: Diagnosis not present

## 2023-07-20 DIAGNOSIS — K589 Irritable bowel syndrome without diarrhea: Secondary | ICD-10-CM | POA: Diagnosis present

## 2023-07-20 DIAGNOSIS — M5116 Intervertebral disc disorders with radiculopathy, lumbar region: Secondary | ICD-10-CM | POA: Diagnosis not present

## 2023-07-20 DIAGNOSIS — R531 Weakness: Secondary | ICD-10-CM | POA: Diagnosis not present

## 2023-07-20 DIAGNOSIS — R1310 Dysphagia, unspecified: Secondary | ICD-10-CM | POA: Diagnosis not present

## 2023-07-20 DIAGNOSIS — F32A Depression, unspecified: Secondary | ICD-10-CM | POA: Diagnosis not present

## 2023-07-20 DIAGNOSIS — M19011 Primary osteoarthritis, right shoulder: Secondary | ICD-10-CM | POA: Diagnosis not present

## 2023-07-20 DIAGNOSIS — R791 Abnormal coagulation profile: Secondary | ICD-10-CM | POA: Diagnosis present

## 2023-07-20 DIAGNOSIS — N390 Urinary tract infection, site not specified: Secondary | ICD-10-CM | POA: Diagnosis present

## 2023-07-20 DIAGNOSIS — Z7901 Long term (current) use of anticoagulants: Secondary | ICD-10-CM

## 2023-07-20 DIAGNOSIS — I672 Cerebral atherosclerosis: Secondary | ICD-10-CM | POA: Diagnosis not present

## 2023-07-20 DIAGNOSIS — I482 Chronic atrial fibrillation, unspecified: Secondary | ICD-10-CM | POA: Diagnosis not present

## 2023-07-20 DIAGNOSIS — Z7401 Bed confinement status: Secondary | ICD-10-CM | POA: Diagnosis not present

## 2023-07-20 DIAGNOSIS — R0989 Other specified symptoms and signs involving the circulatory and respiratory systems: Secondary | ICD-10-CM | POA: Diagnosis not present

## 2023-07-20 DIAGNOSIS — Z79899 Other long term (current) drug therapy: Secondary | ICD-10-CM

## 2023-07-20 DIAGNOSIS — G2581 Restless legs syndrome: Secondary | ICD-10-CM | POA: Diagnosis present

## 2023-07-20 DIAGNOSIS — M4317 Spondylolisthesis, lumbosacral region: Secondary | ICD-10-CM | POA: Diagnosis not present

## 2023-07-20 DIAGNOSIS — R0902 Hypoxemia: Secondary | ICD-10-CM | POA: Diagnosis not present

## 2023-07-20 DIAGNOSIS — Z853 Personal history of malignant neoplasm of breast: Secondary | ICD-10-CM

## 2023-07-20 DIAGNOSIS — K219 Gastro-esophageal reflux disease without esophagitis: Secondary | ICD-10-CM | POA: Diagnosis present

## 2023-07-20 DIAGNOSIS — M21831 Other specified acquired deformities of right forearm: Secondary | ICD-10-CM | POA: Diagnosis present

## 2023-07-20 DIAGNOSIS — Z885 Allergy status to narcotic agent status: Secondary | ICD-10-CM

## 2023-07-20 DIAGNOSIS — Z8673 Personal history of transient ischemic attack (TIA), and cerebral infarction without residual deficits: Secondary | ICD-10-CM

## 2023-07-20 DIAGNOSIS — R262 Difficulty in walking, not elsewhere classified: Secondary | ICD-10-CM | POA: Diagnosis not present

## 2023-07-20 DIAGNOSIS — Q211 Atrial septal defect, unspecified: Secondary | ICD-10-CM

## 2023-07-20 DIAGNOSIS — M7989 Other specified soft tissue disorders: Secondary | ICD-10-CM | POA: Diagnosis not present

## 2023-07-20 DIAGNOSIS — I771 Stricture of artery: Secondary | ICD-10-CM | POA: Diagnosis not present

## 2023-07-20 DIAGNOSIS — M4156 Other secondary scoliosis, lumbar region: Secondary | ICD-10-CM | POA: Diagnosis present

## 2023-07-20 DIAGNOSIS — Z602 Problems related to living alone: Secondary | ICD-10-CM | POA: Diagnosis present

## 2023-07-20 DIAGNOSIS — Z9842 Cataract extraction status, left eye: Secondary | ICD-10-CM

## 2023-07-20 DIAGNOSIS — M4187 Other forms of scoliosis, lumbosacral region: Secondary | ICD-10-CM | POA: Diagnosis not present

## 2023-07-20 DIAGNOSIS — Z96643 Presence of artificial hip joint, bilateral: Secondary | ICD-10-CM | POA: Diagnosis present

## 2023-07-20 DIAGNOSIS — R6510 Systemic inflammatory response syndrome (SIRS) of non-infectious origin without acute organ dysfunction: Secondary | ICD-10-CM | POA: Diagnosis not present

## 2023-07-20 DIAGNOSIS — Z96653 Presence of artificial knee joint, bilateral: Secondary | ICD-10-CM | POA: Diagnosis present

## 2023-07-20 DIAGNOSIS — I3481 Nonrheumatic mitral (valve) annulus calcification: Secondary | ICD-10-CM | POA: Diagnosis not present

## 2023-07-20 DIAGNOSIS — Z90710 Acquired absence of both cervix and uterus: Secondary | ICD-10-CM

## 2023-07-20 DIAGNOSIS — M25531 Pain in right wrist: Secondary | ICD-10-CM | POA: Diagnosis not present

## 2023-07-20 DIAGNOSIS — F419 Anxiety disorder, unspecified: Secondary | ICD-10-CM | POA: Diagnosis not present

## 2023-07-20 DIAGNOSIS — M19042 Primary osteoarthritis, left hand: Secondary | ICD-10-CM | POA: Diagnosis present

## 2023-07-20 DIAGNOSIS — G5603 Carpal tunnel syndrome, bilateral upper limbs: Secondary | ICD-10-CM | POA: Diagnosis present

## 2023-07-20 DIAGNOSIS — Z881 Allergy status to other antibiotic agents status: Secondary | ICD-10-CM

## 2023-07-20 DIAGNOSIS — Z833 Family history of diabetes mellitus: Secondary | ICD-10-CM

## 2023-07-20 DIAGNOSIS — G8929 Other chronic pain: Secondary | ICD-10-CM | POA: Diagnosis present

## 2023-07-20 DIAGNOSIS — Z803 Family history of malignant neoplasm of breast: Secondary | ICD-10-CM

## 2023-07-20 DIAGNOSIS — M47816 Spondylosis without myelopathy or radiculopathy, lumbar region: Secondary | ICD-10-CM | POA: Diagnosis present

## 2023-07-20 DIAGNOSIS — M21832 Other specified acquired deformities of left forearm: Secondary | ICD-10-CM | POA: Diagnosis present

## 2023-07-20 DIAGNOSIS — Z9889 Other specified postprocedural states: Secondary | ICD-10-CM

## 2023-07-20 DIAGNOSIS — M778 Other enthesopathies, not elsewhere classified: Secondary | ICD-10-CM | POA: Diagnosis not present

## 2023-07-20 DIAGNOSIS — M19041 Primary osteoarthritis, right hand: Secondary | ICD-10-CM | POA: Diagnosis present

## 2023-07-20 DIAGNOSIS — M549 Dorsalgia, unspecified: Secondary | ICD-10-CM | POA: Diagnosis not present

## 2023-07-20 DIAGNOSIS — T7840XD Allergy, unspecified, subsequent encounter: Secondary | ICD-10-CM | POA: Diagnosis not present

## 2023-07-20 DIAGNOSIS — Z8719 Personal history of other diseases of the digestive system: Secondary | ICD-10-CM

## 2023-07-20 DIAGNOSIS — R0602 Shortness of breath: Secondary | ICD-10-CM | POA: Diagnosis not present

## 2023-07-20 DIAGNOSIS — M1990 Unspecified osteoarthritis, unspecified site: Secondary | ICD-10-CM | POA: Diagnosis not present

## 2023-07-20 DIAGNOSIS — Z9841 Cataract extraction status, right eye: Secondary | ICD-10-CM

## 2023-07-20 DIAGNOSIS — Z923 Personal history of irradiation: Secondary | ICD-10-CM

## 2023-07-20 DIAGNOSIS — H409 Unspecified glaucoma: Secondary | ICD-10-CM | POA: Diagnosis present

## 2023-07-20 DIAGNOSIS — Z8249 Family history of ischemic heart disease and other diseases of the circulatory system: Secondary | ICD-10-CM

## 2023-07-20 DIAGNOSIS — I10 Essential (primary) hypertension: Secondary | ICD-10-CM | POA: Diagnosis not present

## 2023-07-20 DIAGNOSIS — I4891 Unspecified atrial fibrillation: Secondary | ICD-10-CM | POA: Diagnosis not present

## 2023-07-20 DIAGNOSIS — I6782 Cerebral ischemia: Secondary | ICD-10-CM | POA: Diagnosis not present

## 2023-07-20 DIAGNOSIS — R4182 Altered mental status, unspecified: Secondary | ICD-10-CM | POA: Diagnosis not present

## 2023-07-20 DIAGNOSIS — Z87440 Personal history of urinary (tract) infections: Secondary | ICD-10-CM

## 2023-07-20 DIAGNOSIS — Z8261 Family history of arthritis: Secondary | ICD-10-CM

## 2023-07-20 DIAGNOSIS — Z882 Allergy status to sulfonamides status: Secondary | ICD-10-CM

## 2023-07-20 LAB — T4, FREE: Free T4: 1.07 ng/dL (ref 0.61–1.12)

## 2023-07-20 LAB — URINALYSIS, W/ REFLEX TO CULTURE (INFECTION SUSPECTED)
Bilirubin Urine: NEGATIVE
Glucose, UA: NEGATIVE mg/dL
Hgb urine dipstick: NEGATIVE
Ketones, ur: NEGATIVE mg/dL
Nitrite: NEGATIVE
Protein, ur: NEGATIVE mg/dL
Specific Gravity, Urine: 1.023 (ref 1.005–1.030)
pH: 5 (ref 5.0–8.0)

## 2023-07-20 LAB — CBC WITH DIFFERENTIAL/PLATELET
Abs Immature Granulocytes: 0.03 10*3/uL (ref 0.00–0.07)
Basophils Absolute: 0.1 10*3/uL (ref 0.0–0.1)
Basophils Relative: 1 %
Eosinophils Absolute: 0.1 10*3/uL (ref 0.0–0.5)
Eosinophils Relative: 1 %
HCT: 38.2 % (ref 36.0–46.0)
Hemoglobin: 12.3 g/dL (ref 12.0–15.0)
Immature Granulocytes: 0 %
Lymphocytes Relative: 10 %
Lymphs Abs: 1 10*3/uL (ref 0.7–4.0)
MCH: 27.8 pg (ref 26.0–34.0)
MCHC: 32.2 g/dL (ref 30.0–36.0)
MCV: 86.2 fL (ref 80.0–100.0)
Monocytes Absolute: 0.7 10*3/uL (ref 0.1–1.0)
Monocytes Relative: 7 %
Neutro Abs: 8 10*3/uL — ABNORMAL HIGH (ref 1.7–7.7)
Neutrophils Relative %: 81 %
Platelets: 402 10*3/uL — ABNORMAL HIGH (ref 150–400)
RBC: 4.43 MIL/uL (ref 3.87–5.11)
RDW: 13.9 % (ref 11.5–15.5)
WBC: 9.8 10*3/uL (ref 4.0–10.5)
nRBC: 0 % (ref 0.0–0.2)

## 2023-07-20 LAB — PROTIME-INR
INR: 3.4 — ABNORMAL HIGH (ref 0.8–1.2)
Prothrombin Time: 34.7 s — ABNORMAL HIGH (ref 11.4–15.2)

## 2023-07-20 LAB — TSH: TSH: 1.224 u[IU]/mL (ref 0.350–4.500)

## 2023-07-20 LAB — RESP PANEL BY RT-PCR (RSV, FLU A&B, COVID)  RVPGX2
Influenza A by PCR: NEGATIVE
Influenza B by PCR: NEGATIVE
Resp Syncytial Virus by PCR: NEGATIVE
SARS Coronavirus 2 by RT PCR: NEGATIVE

## 2023-07-20 LAB — TROPONIN I (HIGH SENSITIVITY)
Troponin I (High Sensitivity): 3 ng/L (ref <18)
Troponin I (High Sensitivity): 3 ng/L (ref <18)

## 2023-07-20 LAB — COMPREHENSIVE METABOLIC PANEL
ALT: 9 U/L (ref 0–44)
AST: 16 U/L (ref 15–41)
Albumin: 2.8 g/dL — ABNORMAL LOW (ref 3.5–5.0)
Alkaline Phosphatase: 72 U/L (ref 38–126)
Anion gap: 9 (ref 5–15)
BUN: 13 mg/dL (ref 8–23)
CO2: 22 mmol/L (ref 22–32)
Calcium: 9.2 mg/dL (ref 8.9–10.3)
Chloride: 107 mmol/L (ref 98–111)
Creatinine, Ser: 0.78 mg/dL (ref 0.44–1.00)
GFR, Estimated: >60 mL/min (ref >60)
Glucose, Bld: 125 mg/dL — ABNORMAL HIGH (ref 70–99)
Potassium: 4.2 mmol/L (ref 3.5–5.1)
Sodium: 138 mmol/L (ref 135–145)
Total Bilirubin: 0.7 mg/dL (ref 0.0–1.2)
Total Protein: 6.3 g/dL — ABNORMAL LOW (ref 6.5–8.1)

## 2023-07-20 LAB — D-DIMER, QUANTITATIVE: D-Dimer, Quant: 1 ug{FEU}/mL — ABNORMAL HIGH (ref 0.00–0.50)

## 2023-07-20 LAB — VITAMIN B12: Vitamin B-12: 691 pg/mL (ref 180–914)

## 2023-07-20 LAB — CK: Total CK: 20 U/L — ABNORMAL LOW (ref 38–234)

## 2023-07-20 MED ORDER — ONDANSETRON 4 MG PO TBDP
4.0000 mg | ORAL_TABLET | Freq: Once | ORAL | Status: AC
  Administered 2023-07-20: 4 mg via ORAL
  Filled 2023-07-20: qty 1

## 2023-07-20 MED ORDER — ACETAMINOPHEN 650 MG RE SUPP: 650.0000 mg | Freq: Four times a day (QID) | RECTAL | Status: DC | PRN

## 2023-07-20 MED ORDER — ROPINIROLE HCL 1 MG PO TABS
1.0000 mg | ORAL_TABLET | Freq: Every evening | ORAL | Status: DC | PRN
  Administered 2023-07-20 – 2023-07-23 (×4): 1 mg via ORAL
  Filled 2023-07-20 (×4): qty 1

## 2023-07-20 MED ORDER — CEPHALEXIN 250 MG PO CAPS
500.0000 mg | ORAL_CAPSULE | Freq: Once | ORAL | Status: AC
  Administered 2023-07-20: 500 mg via ORAL
  Filled 2023-07-20: qty 2

## 2023-07-20 MED ORDER — TRAMADOL HCL 50 MG PO TABS
50.0000 mg | ORAL_TABLET | Freq: Once | ORAL | Status: AC
  Administered 2023-07-20: 50 mg via ORAL
  Filled 2023-07-20: qty 1

## 2023-07-20 MED ORDER — CEPHALEXIN 250 MG PO CAPS
500.0000 mg | ORAL_CAPSULE | Freq: Two times a day (BID) | ORAL | Status: DC
  Administered 2023-07-20 – 2023-07-21 (×2): 500 mg via ORAL
  Filled 2023-07-20 (×2): qty 2

## 2023-07-20 MED ORDER — WARFARIN - PHARMACIST DOSING INPATIENT: Freq: Every day | Status: DC

## 2023-07-20 MED ORDER — SERTRALINE HCL 100 MG PO TABS
100.0000 mg | ORAL_TABLET | Freq: Every day | ORAL | Status: DC
Start: 2023-07-20 — End: 2023-07-22
  Administered 2023-07-20 – 2023-07-21 (×2): 100 mg via ORAL
  Filled 2023-07-20 (×2): qty 1

## 2023-07-20 MED ORDER — SODIUM CHLORIDE 0.9% FLUSH
3.0000 mL | Freq: Two times a day (BID) | INTRAVENOUS | Status: DC
  Administered 2023-07-20 – 2023-07-24 (×7): 3 mL via INTRAVENOUS

## 2023-07-20 MED ORDER — SODIUM CHLORIDE 0.9 % IV SOLN: INTRAVENOUS | Status: AC

## 2023-07-20 MED ORDER — ACETAMINOPHEN 325 MG PO TABS
650.0000 mg | ORAL_TABLET | Freq: Four times a day (QID) | ORAL | Status: DC | PRN
  Administered 2023-07-23 – 2023-07-24 (×3): 650 mg via ORAL
  Filled 2023-07-20 (×3): qty 2

## 2023-07-20 MED ORDER — ALPRAZOLAM 0.25 MG PO TABS
0.2500 mg | ORAL_TABLET | Freq: Two times a day (BID) | ORAL | Status: DC | PRN
  Administered 2023-07-20 – 2023-07-24 (×6): 0.25 mg via ORAL
  Filled 2023-07-20 (×6): qty 1

## 2023-07-20 MED ORDER — PANTOPRAZOLE SODIUM 40 MG IV SOLR
40.0000 mg | INTRAVENOUS | Status: DC
  Administered 2023-07-20 – 2023-07-24 (×5): 40 mg via INTRAVENOUS
  Filled 2023-07-20 (×5): qty 10

## 2023-07-20 MED ORDER — TRAMADOL HCL 50 MG PO TABS
50.0000 mg | ORAL_TABLET | Freq: Every day | ORAL | Status: DC | PRN
  Administered 2023-07-20 – 2023-07-22 (×5): 50 mg via ORAL
  Filled 2023-07-20 (×6): qty 1

## 2023-07-20 NOTE — Assessment & Plan Note (Signed)
 Vitals:   07/20/23 1130 07/20/23 1200 07/20/23 1230 07/20/23 1300  BP: (!) 111/57 (!) 114/54 (!) 117/56 (!) 112/52   07/20/23 1330 07/20/23 1400 07/20/23 1430 07/20/23 1500  BP: (!) 108/53 109/60 114/62 104/62   07/20/23 1630 07/20/23 1700 07/20/23 1845 07/20/23 1925  BP: 123/62 (!) 103/55 (!) 113/57 129/79  Stable. No BP meds PTA.

## 2023-07-20 NOTE — Assessment & Plan Note (Signed)
 Pt meets SIRS with vitals and possible sepsis.  Will follow culture. Continue keflex.

## 2023-07-20 NOTE — ED Notes (Signed)
 Pt's walking Sa02 was 87% on RA

## 2023-07-20 NOTE — Assessment & Plan Note (Signed)
 PT consult.  Will get TFT / B12 / CPK .

## 2023-07-20 NOTE — Progress Notes (Signed)
 PHARMACY - ANTICOAGULATION CONSULT NOTE  Pharmacy Consult for warfarin Indication: atrial fibrillation  Allergies  Allergen Reactions   Dilaudid  [Hydromorphone  Hcl] Shortness Of Breath    Tolerates tramadol    Hydromorphone  Other (See Comments)    UNKNOWN TO PATIENT   Morphine  Other (See Comments)    UNKNOWN TO PATIENT   Morphine  And Codeine Shortness Of Breath    Tolerates tramadol    Sulfamethoxazole Other (See Comments)    UNKNOWN TO PATIENT   Sulfa Antibiotics Other (See Comments)    Made me drunk;  wobbly   Keflex  [Cephalexin ] Nausea Only   Levaquin  [Levofloxacin ] Hives and Rash    Patient Measurements: Height: 5' 3 (160 cm) Weight: 66.2 kg (145 lb 15.1 oz) IBW/kg (Calculated) : 52.4   Vital Signs: Temp: 99.3 F (37.4 C) (01/11 1229) Temp Source: Oral (01/11 1229) BP: 104/62 (01/11 1500) Pulse Rate: 87 (01/11 1500)  Labs: Recent Labs    07/20/23 1111 07/20/23 1248 07/20/23 1410  HGB 12.3  --   --   HCT 38.2  --   --   PLT 402*  --   --   LABPROT  --   --  34.7*  INR  --   --  3.4*  CREATININE 0.78  --   --   CKTOTAL 20*  --   --   TROPONINIHS 3 3  --     Estimated Creatinine Clearance: 45.3 mL/min (by C-G formula based on SCr of 0.78 mg/dL).   Medical History: Past Medical History:  Diagnosis Date   ASD (atrial septal defect)    Cancer (HCC)    breast, left   Carpal tunnel syndrome, bilateral    Depression    Gait disorder    GERD (gastroesophageal reflux disease)    Glaucoma    Glaucoma    History of stroke    Hypertension    IBS (irritable bowel syndrome)    Personal history of radiation therapy 1996   S/P bunionectomy    bilateral    Assessment: 64 YOF presenting with weakness, hx of afib on warfarin PTA with last dose taken 1/10, INR on admission is supratherapeutic at 3.4  PTA dosing:  2mg  daily except 3mg  on Wed  Goal of Therapy:  INR 2-3 Monitor platelets by anticoagulation protocol: Yes   Plan:  Omit warfarin dose  tonight Daily INR, s/s bleeding  Dorn Poot, PharmD, [facility] Clinical Pharmacist ED Pharmacist Phone # [phone] 07/20/2023 4:14 PM

## 2023-07-20 NOTE — ED Provider Notes (Addendum)
 Portal EMERGENCY DEPARTMENT AT [facility] Provider Note   CSN: [id number] Arrival date & time: 07/20/23  9178     History  Chief Complaint  Patient presents with   Weakness    Renee Adams is a 89 y.o. female.  HPI   89 y.o. female with past medical history of small bowel obstruction, atrial fibrillation, chronic anticoagulation, anxiety, essential hypertension, history of UTI, ASD last seen by cardiology few months ago, history of breast cancer presenting today with generalized weakness more noticed by family.  Patient does not report any complaints of nausea vomiting diarrhea fevers chills abdominal pain headaches blurred vision speech or gait issues or any other symptoms.  Son at bedside states that this morning she told him that she felt like she was having a stroke her right shoulder was hurting.  Patient states that she has been having worsening weakness over the last 3 weeks with her having difficulty getting to the mailbox and back.  Patient today was so weak, that she was unable to get out of bed without assistance.  Per the son, paramedics had to help her get out of the bed which is highly unusual for the patient.  Patient also indicates that for the last several months she has been having worsening pain over the right shoulder. Home Medications Prior to Admission medications   Medication Sig Start Date End Date Taking? Authorizing Provider  ALPRAZolam  (XANAX ) 0.25 MG tablet Take 4 tablets (1 mg total) by mouth every 4 (four) hours as needed for anxiety. 12/17/12  Yes Angiulli, Toribio PARAS, PA-C  ketoconazole  (NIZORAL ) 2 % cream APPLY TO AFFECTED AREA ON THE SKIN DAILY AS NEEDED FOR IRRITATION Patient taking differently: Apply 1 Application topically once as needed. 02/25/23  Yes Iruku, Praveena, MD  metroNIDAZOLE (METROGEL) 0.75 % vaginal gel Place 1 Applicatorful vaginally See admin instructions. Patient stated she uses it twice a week. 07/20/2023.   Yes [provider]  Multiple Vitamin (MULTIVITAMIN WITH MINERALS) TABS tablet Take 1 tablet by mouth daily.   Yes [provider]  rOPINIRole  (REQUIP ) 1 MG tablet Take 1 tablet (1 mg total) by mouth at bedtime as needed. For restless legs Patient taking differently: Take 1 mg by mouth at bedtime as needed (restless legs). 12/17/12  Yes Angiulli, Toribio PARAS, PA-C  sertraline  (ZOLOFT ) 100 MG tablet Take 1.5 tablets (150 mg total) by mouth daily. Patient taking differently: Take 100 mg by mouth daily. 12/17/12  Yes Angiulli, Toribio PARAS, PA-C  traMADol  (ULTRAM ) 50 MG tablet Take 100 mg by mouth daily as needed (Finger pain). 07/12/21  Yes [provider]  VITAMIN D PO Take 1 tablet by mouth daily.   Yes [provider]  warfarin (COUMADIN ) 2 MG tablet TAKE 1 TO 1 AND 1/2 TABLETS BY MOUTH DAILY AS DIRECTED BY THE COUMADIN  CLINIC Patient taking differently: Take 2 mg by mouth See admin instructions. Take 2 mg by mouth every day except on Wednesdays take 3 mg per patient 02/20/23  Yes Dann Candyce RAMAN, MD  acetaminophen  (TYLENOL ) 500 MG tablet Take 1 tablet (500 mg total) by mouth every 8 (eight) hours as needed. Patient not taking: Reported on 07/20/2023 02/07/21   Magrinat, Sandria BROCKS, MD  albuterol  (VENTOLIN  HFA) 108 [phone] Base) MCG/ACT inhaler Inhale 2 puffs into the lungs every 6 (six) hours as needed for wheezing or shortness of breath. Patient not taking: Reported on 07/20/2023 04/08/23   [place], Camellia PARAS, DO  fluticasone  (FLONASE ) 50  MCG/ACT nasal spray Place 2 sprays into both nostrils daily. Patient not taking: Reported on 07/20/2023 04/09/23   [place], Camellia PARAS, DO  loratadine  (CLARITIN ) 10 MG tablet Take 1 tablet (10 mg total) by mouth daily as needed for allergies or rhinitis. Patient not taking: Reported on 07/20/2023 04/08/23   [place], Camellia PARAS, DO  pramipexole (MIRAPEX) 0.125 MG tablet Take 0.125 mg by mouth 3 (three) times daily.  09/15/11  [provider]      Allergies     Dilaudid  [hydromorphone  hcl], Hydromorphone , Morphine , Morphine  and codeine, Sulfamethoxazole, Sulfa antibiotics, Keflex  [cephalexin ], and Levaquin  [levofloxacin ]    Review of Systems   Review of Systems  All other systems reviewed and are negative.   Physical Exam Updated Vital Signs BP (!) 108/59   Pulse 74   Temp 97.8 F (36.6 C) (Oral)   Resp 20   Ht 5' 3 (1.6 m)   Wt 66.2 kg   SpO2 93%   BMI 25.85 kg/m  Physical Exam Vitals and nursing note reviewed.  Constitutional:      Appearance: She is well-developed.  HENT:     Head: Atraumatic.  Cardiovascular:     Rate and Rhythm: Normal rate.  Pulmonary:     Effort: Pulmonary effort is normal.  Musculoskeletal:     Cervical back: Normal range of motion and neck supple.  Skin:    General: Skin is warm and dry.  Neurological:     Mental Status: She is alert and oriented to person, place, and time.     Comments: Reproducible tenderness with active range of motion of the right upper extremity     ED Results / Procedures / Treatments   Labs (all labs ordered are listed, but only abnormal results are displayed) Labs Reviewed  COMPREHENSIVE METABOLIC PANEL - Abnormal; Notable for the following components:      Result Value   Glucose, Bld 125 (*)    Total Protein 6.3 (*)    Albumin 2.8 (*)    All other components within normal limits  CBC WITH DIFFERENTIAL/PLATELET - Abnormal; Notable for the following components:   Platelets 402 (*)    Neutro Abs 8.0 (*)    All other components within normal limits  URINALYSIS, W/ REFLEX TO CULTURE (INFECTION SUSPECTED) - Abnormal; Notable for the following components:   APPearance HAZY (*)    Leukocytes,Ua SMALL (*)    Bacteria, UA RARE (*)    All other components within normal limits  CK - Abnormal; Notable for the following components:   Total CK 20 (*)    All other components within normal limits  PROTIME-INR - Abnormal; Notable for the following components:   Prothrombin  Time 34.7 (*)    INR 3.4 (*)    All other components within normal limits  D-DIMER, QUANTITATIVE - Abnormal; Notable for the following components:   D-Dimer, Quant 1.00 (*)    All other components within normal limits  PROTIME-INR - Abnormal; Notable for the following components:   Prothrombin Time 40.8 (*)    INR 4.2 (*)    All other components within normal limits  COMPREHENSIVE METABOLIC PANEL - Abnormal; Notable for the following components:   Total Protein 5.8 (*)    Albumin 2.5 (*)    AST 13 (*)    All other components within normal limits  CBC - Abnormal; Notable for the following components:   Hemoglobin 11.0 (*)    HCT 35.2 (*)    All other components  within normal limits  RESP PANEL BY RT-PCR (RSV, FLU A&B, COVID)  RVPGX2  TSH  VITAMIN B12  T4, FREE  TROPONIN I (HIGH SENSITIVITY)  TROPONIN I (HIGH SENSITIVITY)    EKG EKG Interpretation Date/Time:  Saturday July 20 2023 08:23:11 EST Ventricular Rate:  90 PR Interval:  157 QRS Duration:  81 QT Interval:  373 QTC Calculation: 457 R Axis:   -38  Text Interpretation: Sinus rhythm Abnormal R-wave progression, early transition Inferior infarct, old No acute changes No significant change since last tracing Confirmed by Charlyn Sora [phone]) on 07/20/2023 1:03:22 PM  Radiology CT HEAD WO CONTRAST ( ) Result Date: 07/20/2023 CLINICAL DATA:  Altered mental status EXAM: CT HEAD WITHOUT CONTRAST TECHNIQUE: Contiguous axial images were obtained from the base of the skull through the vertex without intravenous contrast. RADIATION DOSE REDUCTION: This exam was performed according to the departmental dose-optimization program which includes automated exposure control, adjustment of the mA and/or kV according to patient size and/or use of iterative reconstruction technique. COMPARISON:  12/05/2012 FINDINGS: Brain: No evidence of acute infarction, hemorrhage, hydrocephalus, extra-axial collection or mass lesion/mass effect. Age  related atrophy. Old left parietal infarct. Subcortical white matter and periventricular small vessel ischemic changes. Vascular: Mild intracranial atherosclerosis. Skull: Normal. Negative for fracture or focal lesion. Sinuses/Orbits: The visualized paranasal sinuses are essentially clear. The mastoid air cells are unopacified. Other: None. IMPRESSION: No acute intracranial abnormality. Old left parietal infarct. Atrophy with small vessel ischemic changes. Electronically Signed   By: Pinkie Pebbles M.D.   On: 07/20/2023 19:17   DG Chest Port 1 View Result Date: 07/20/2023 CLINICAL DATA:  Hypoxia.  Weakness. EXAM: PORTABLE CHEST 1 VIEW COMPARISON:  04/07/2023 FINDINGS: Lung volumes are low. Upper normal heart size. Normal mediastinal contours. There mitral annulus calcifications. A skin fold projects over the right lateral hemithorax. No focal airspace disease, pulmonary edema, large pleural effusion or pneumothorax. Left axillary surgical clips. IMPRESSION: Low lung volumes without acute chest finding. Electronically Signed   By: Andrea Gasman M.D.   On: 07/20/2023 15:10   DG Wrist 2 Views Right Result Date: 07/20/2023 CLINICAL DATA:  Pain. EXAM: RIGHT WRIST - 2 VIEW COMPARISON:  None Available. FINDINGS: No acute fracture. Appears contracted. Abnormal alignment of the thumb carpal metacarpal joint which appears subluxed, of unknown acuity. Joint space narrowing and subchondral changes at the thumb carpal metacarpal joint. Borderline widening of the scapholunate interval at 3 mm. No erosive or bony destructive change. Mild generalized soft tissue edema. IMPRESSION: 1. Abnormal alignment of the thumb carpometacarpal joint which appears subluxed, of unknown acuity.The hand appears to be contracted. Recommend correlation with physical exam. 2. Borderline widening of the scapholunate interval at 3 mm can be seen with ligamentous deficiency. Electronically Signed   By: Andrea Gasman M.D.   On: 07/20/2023  15:09   DG Elbow 2 Views Right Result Date: 07/20/2023 CLINICAL DATA:  Worsening pain.  Right elbow pain. EXAM: RIGHT ELBOW - 2 VIEW COMPARISON:  None Available. FINDINGS: Technically limited by positioning. No evidence of fracture. There is a prominent anterior fat pad but no definite posterior fat pad displacement to suggest effusion. No erosions. Tiny olecranon spur. No focal soft tissue abnormalities. IMPRESSION: 1. No acute findings. 2. Tiny olecranon spur. 3. Prominent anterior fat pad without definite joint effusion. Electronically Signed   By: Andrea Gasman M.D.   On: 07/20/2023 15:07   DG Shoulder Right Portable Result Date: 07/20/2023 CLINICAL DATA:  Worsening pain.  Right shoulder pain. EXAM:  RIGHT SHOULDER - 1 VIEW COMPARISON:  None Available. FINDINGS: There is no evidence of fracture or dislocation. Acromioclavicular degenerative change with joint space narrowing and inferior spurring. No erosions or focal bone abnormality. Soft tissues are unremarkable. IMPRESSION: Mild to moderate glenohumeral osteoarthritis. Electronically Signed   By: Andrea Gasman M.D.   On: 07/20/2023 15:05    Procedures Procedures    Medications Ordered in ED Medications  Warfarin - Pharmacist Dosing Inpatient (has no administration in time range)  sodium chloride  flush (NS) 0.9 % injection 3 mL (3 mLs Intravenous Given 07/20/23 2212)  acetaminophen  (TYLENOL ) tablet 650 mg (has no administration in time range)    Or  acetaminophen  (TYLENOL ) suppository 650 mg (has no administration in time range)  0.9 %  sodium chloride  infusion ( Intravenous New Bag/Given 07/20/23 1654)  pantoprazole  (PROTONIX ) injection 40 mg (40 mg Intravenous Given 07/20/23 1655)  ALPRAZolam  (XANAX ) tablet 0.25 mg (0.25 mg Oral Given 07/20/23 2212)  sertraline  (ZOLOFT ) tablet 100 mg (100 mg Oral Given 07/21/23 0919)  rOPINIRole  (REQUIP ) tablet 1 mg (1 mg Oral Given 07/20/23 2212)  traMADol  (ULTRAM ) tablet 50 mg (50 mg Oral Given  07/20/23 2212)  traMADol  (ULTRAM ) tablet 50 mg (50 mg Oral Given 07/20/23 1115)  ondansetron  (ZOFRAN -ODT) disintegrating tablet 4 mg (4 mg Oral Given 07/20/23 1115)  cephALEXin  (KEFLEX ) capsule 500 mg (500 mg Oral Given 07/20/23 1436)    ED Course/ Medical Decision Making/ A&P                                 Medical Decision Making Amount and/or Complexity of Data Reviewed Labs: ordered. Radiology: ordered.  Risk Prescription drug management. Decision regarding hospitalization.   This patient presents to the ED with chief complaint(s) of weakness, right arm pain with pertinent past medical history of arthritis, stroke on warfarin.The complaint involves an extensive differential diagnosis and also carries with it a high risk of complications and morbidity.    The differential diagnosis includes: Acute electrolyte abnormality, renal failure, severe anemia, UTI. Shortness of breath could be because of valvular disorder, CHF, pneumonia, pleural effusion.  ACS less likely.  The initial plan is to get basic labs, UA and reassess.  Shoulder pain appears to be because of arthritis.   Additional history obtained: Additional history obtained from family Records reviewed previous admission documents  Independent labs interpretation:  The following labs were independently interpreted: CBC is reassuring.  UA shows no signs of infection.  CK is also normal.  Independent visualization and interpretation of imaging: - I independently visualized the following imaging with scope of interpretation limited to determining acute life threatening conditions related to emergency care: Radiographs of the upper extremity, which revealed no evidence of fracture  Treatment and Reassessment: Patient reassessed. Acute weakness, leading to ER visit in a patient who lives independently.  There has been some gradual decline, but there was an abrupt change where patient cannot do any ADLs.  We have requested  admission for further workup.  High risk for discharge in someone who is weak, with high risk for fall, while she is on Coumadin  which is supratherapeutic.   Final Clinical Impression(s) / ED Diagnoses Final diagnoses:  Generalized weakness  Supratherapeutic INR    Rx / DC Orders ED Discharge Orders     None           Charlyn Sora, MD 07/21/23 1013

## 2023-07-20 NOTE — Hospital Course (Addendum)
 89yo with hx afib on chronic anticoagulation, anxiety , HTN, ASD, hx breast ca presented to ED with increased generalized weakness.  Pt initially reported progressive weakness on trying to get to and from mailbox. On the morning of presentation, pt recalls being unable to get out of bed, states having no strength in her L leg. Denied acute UE weakness. Pt did report on-going issues regarding R shoulder pains

## 2023-07-20 NOTE — ED Triage Notes (Signed)
 Pt arrived via [facility] from home for c/o generalized weakness started upon waking at 0530. Pt c/o worse pain in right shoulder that radiates down right arm.

## 2023-07-20 NOTE — Assessment & Plan Note (Signed)
 Mild cystitis pt is asymptomatic. Will follow culture and treat. We will cont keflex  started in ed.  Urinalysis    Component Value Date/Time   COLORURINE YELLOW 07/20/2023 1242   APPEARANCEUR HAZY (A) 07/20/2023 1242   LABSPEC 1.023 07/20/2023 1242   LABSPEC 1.020 04/17/2006 1216   PHURINE 5.0 07/20/2023 1242   GLUCOSEU NEGATIVE 07/20/2023 1242   HGBUR NEGATIVE 07/20/2023 1242   BILIRUBINUR NEGATIVE 07/20/2023 1242   BILIRUBINUR Negative 04/17/2006 1216   KETONESUR NEGATIVE 07/20/2023 1242   PROTEINUR NEGATIVE 07/20/2023 1242   UROBILINOGEN 0.2 12/03/2012 2214   NITRITE NEGATIVE 07/20/2023 1242   LEUKOCYTESUR SMALL (A) 07/20/2023 1242   LEUKOCYTESUR Negative 04/17/2006 1216

## 2023-07-20 NOTE — Assessment & Plan Note (Signed)
 EKG today is SR. Pt is on coumadin.  We will continue same and current pt's INR is  3.4.

## 2023-07-20 NOTE — H&P (Signed)
 History and Physical    Patient: Renee Adams:[id number] DOB: 09-06-1933 DOA: 07/20/2023 DOS: the patient was seen and examined on 07/20/2023 PCP: Leonel Cole, MD  Patient coming from: Home Chief complaint: Chief Complaint  Patient presents with   Weakness   HPI:  Renee Adams is a 89 y.o. female with past medical history  of allergies to Dilaudid , morphine , sulfa antibiotics, Keflex , Levaquin , history of small bowel obstruction, atrial fibrillation, chronic anticoagulation, anxiety, essential hypertension, history of UTI, ASD last seen by cardiology few months ago, history of breast cancer presenting today with generalized weakness more noticed by family.  Patient does not report any complaints of nausea vomiting diarrhea fevers chills abdominal pain headaches blurred vision speech or gait issues or any other symptoms.  Son at bedside states that this morning she told him that she felt like she was having a stroke her right shoulder was hurting.  ED Course: In emergency room vitals trend shows patient meets SIRS criteria as far as vitals. Vitals:   07/20/23 1700 07/20/23 1712 07/20/23 1845 07/20/23 1925  BP: (!) 103/55  (!) 113/57 129/79  Pulse: 85  82 85  Temp:  97.8 F (36.6 C)  98.3 F (36.8 C)  Resp: 17  17 17   Height:      Weight:      SpO2: 92%  92% 93%  TempSrc:  Oral  Oral  BMI (Calculated):      EKG shows sinus rhythm at 90, old inferior infarct, poor voltage tracing, PR of 157, QTc 457.  But no ST-T wave elevations.  Nonspecific ST depression in lead V4 and V5. Evaluation so far shows: CMP shows glucose 125 normal LFTs normal kidney function, normal CPK, troponin of 3 x 2. EKG nonischemic as above. CBC is within normal limits except for mild elevation of platelets at 402, INR is 3.4. Viral panel negative for influenza RSV and COVID. Urinalysis is hazy with 6-10 WBCs 0-5 RBCs for bacteria but nitrite negative and small leukocytes- possible cystitis.  Chest  x-ray today showed low lung volumes without any acute chest findings. Right elbow shows an olecranon spur and no acute findings. The right wrist shows thumb carpometacarpal joint subluxation.  In the ED pt received: Medications  traMADol  (ULTRAM ) tablet 50 mg (50 mg Oral Given 07/20/23 1115)  ondansetron  (ZOFRAN -ODT) disintegrating tablet 4 mg (4 mg Oral Given 07/20/23 1115)    Review of Systems  Constitutional:  Positive for malaise/fatigue.  Musculoskeletal:  Positive for joint pain.       Right shoulder pain.   Neurological:  Positive for weakness.  All other systems reviewed and are negative.  Past Medical History:  Diagnosis Date   ASD (atrial septal defect)    Cancer (HCC)    breast, left   Carpal tunnel syndrome, bilateral    Depression    Gait disorder    GERD (gastroesophageal reflux disease)    Glaucoma    Glaucoma    History of stroke    Hypertension    IBS (irritable bowel syndrome)    Personal history of radiation therapy 1996   S/P bunionectomy    bilateral   Past Surgical History:  Procedure Laterality Date   ABDOMINAL HYSTERECTOMY     bladder resuspension     BREAST BIOPSY Left    BREAST BIOPSY Right    BREAST LUMPECTOMY Left 1996   CATARACT EXTRACTION Bilateral    INCISION AND DRAINAGE HIP  03/31/2012   Procedure: IRRIGATION AND DEBRIDEMENT  HIP WITH POLY EXCHANGE;  Surgeon: Garnette JONETTA Raman, MD;  Location: [facility] OR;  Service: Orthopedics;  Laterality: Left;   ORIF FEMUR FRACTURE Right 11/30/2012   Procedure: OPEN REDUCTION INTERNAL FIXATION (ORIF) DISTAL FEMUR FRACTURE;  Surgeon: Marcey Raman, MD;  Location: [facility] OR;  Service: Orthopedics;  Laterality: Right;   TOTAL HIP ARTHROPLASTY Bilateral    TOTAL KNEE ARTHROPLASTY Bilateral     reports that she has never smoked. She has never used smokeless tobacco. She reports that she does not drink alcohol and does not use drugs.  Allergies  Allergen Reactions   Dilaudid  [Hydromorphone  Hcl] Shortness Of Breath     Tolerates tramadol    Hydromorphone  Other (See Comments)    UNKNOWN TO PATIENT   Morphine  Other (See Comments)    UNKNOWN TO PATIENT   Morphine  And Codeine Shortness Of Breath    Tolerates tramadol    Sulfamethoxazole Other (See Comments)    UNKNOWN TO PATIENT   Sulfa Antibiotics Other (See Comments)    Made me drunk;  wobbly   Keflex  [Cephalexin ] Nausea Only   Levaquin  [Levofloxacin ] Hives and Rash    Family History  Problem Relation Age of Onset   Cancer Mother    Breast cancer Mother    Heart failure Father    Diabetes Sister    Osteoarthritis Brother    Breast cancer Maternal Aunt    Heart attack Neg Hx     Prior to Admission medications   Medication Sig Start Date End Date Taking? Authorizing Provider  acetaminophen  (TYLENOL ) 500 MG tablet Take 1 tablet (500 mg total) by mouth every 8 (eight) hours as needed. Patient taking differently: Take 500 mg by mouth every 8 (eight) hours as needed for moderate pain or headache. 02/07/21   Magrinat, Sandria BROCKS, MD  albuterol  (VENTOLIN  HFA) 108 (90 Base) MCG/ACT inhaler Inhale 2 puffs into the lungs every 6 (six) hours as needed for wheezing or shortness of breath. 04/08/23   [place], Camellia PARAS, DO  ALPRAZolam  (XANAX ) 0.25 MG tablet Take 4 tablets (1 mg total) by mouth every 4 (four) hours as needed for anxiety. 12/17/12   Angiulli, Toribio PARAS, PA-C  fluticasone  (FLONASE ) 50 MCG/ACT nasal spray Place 2 sprays into both nostrils daily. 04/09/23   [place], Eric J, DO  ketoconazole  (NIZORAL ) 2 % cream APPLY TO AFFECTED AREA ON THE SKIN DAILY AS NEEDED FOR IRRITATION 02/25/23   Iruku, Praveena, MD  loratadine  (CLARITIN ) 10 MG tablet Take 1 tablet (10 mg total) by mouth daily as needed for allergies or rhinitis. 04/08/23   [place], Camellia PARAS, DO  Multiple Vitamin (MULTIVITAMIN WITH MINERALS) TABS tablet Take 1 tablet by mouth daily.    [provider]  rOPINIRole  (REQUIP ) 1 MG tablet Take 1 tablet (1 mg total) by mouth at bedtime as needed. For  restless legs Patient taking differently: Take 1 mg by mouth at bedtime as needed (restless legs). 12/17/12   Angiulli, Toribio PARAS, PA-C  sertraline  (ZOLOFT ) 100 MG tablet Take 1.5 tablets (150 mg total) by mouth daily. Patient taking differently: Take 100 mg by mouth daily. 12/17/12   Angiulli, Toribio PARAS, PA-C  traMADol  (ULTRAM ) 50 MG tablet Take 100 mg by mouth daily as needed (Finger pain). 07/12/21   [provider]  VITAMIN D PO Take 1 tablet by mouth daily.    [provider]  warfarin (COUMADIN ) 2 MG tablet TAKE 1 TO 1 AND 1/2 TABLETS BY MOUTH DAILY AS DIRECTED BY THE COUMADIN  CLINIC Patient taking differently:  Take 2 mg by mouth See admin instructions. Take 2 mg by mouth every day except on Wednesdays take 3 mg per patient 02/20/23   Varanasi, Jayadeep S, MD  pramipexole (MIRAPEX) 0.125 MG tablet Take 0.125 mg by mouth 3 (three) times daily.  09/15/11  [provider]     Vitals:   07/20/23 1700 07/20/23 1712 07/20/23 1845 07/20/23 1925  BP: (!) 103/55  (!) 113/57 129/79  Pulse: 85  82 85  Resp: 17  17 17   Temp:  97.8 F (36.6 C)  98.3 F (36.8 C)  TempSrc:  Oral  Oral  SpO2: 92%  92% 93%  Weight:      Height:       Physical Exam Vitals and nursing note reviewed.  Constitutional:      General: She is not in acute distress. HENT:     Head: Normocephalic and atraumatic.     Right Ear: External ear normal.     Left Ear: External ear normal.     Nose: Nose normal. No nasal deformity.     Mouth/Throat:     Lips: Pink.     Tongue: No lesions.     Pharynx: Oropharynx is clear.  Eyes:     General: Lids are normal.     Extraocular Movements: Extraocular movements intact.  Cardiovascular:     Rate and Rhythm: Normal rate and regular rhythm.     Heart sounds: Normal heart sounds.  Pulmonary:     Effort: Pulmonary effort is normal.     Breath sounds: Normal breath sounds.  Abdominal:     General: Bowel sounds are normal. There is no distension.      Palpations: Abdomen is soft. There is no mass.     Tenderness: There is no abdominal tenderness.  Musculoskeletal:        General: Deformity present.     Right lower leg: No edema.     Left lower leg: No edema.     Comments: Bl subluxation of 1st MCP.  Skin:    General: Skin is warm.  Neurological:     General: No focal deficit present.     Mental Status: She is alert and oriented to person, place, and time.     Cranial Nerves: Cranial nerves 2-12 are intact.  Psychiatric:        Attention and Perception: Attention normal.        Mood and Affect: Mood normal.        Speech: Speech normal.        Behavior: Behavior normal. Behavior is cooperative.      Labs on Admission: I have personally reviewed following labs and imaging studies Results for orders placed or performed during the hospital encounter of 07/20/23 (from the past 24 hours)  Comprehensive metabolic panel     Status: Abnormal   Collection Time: 07/20/23 11:11 AM  Result Value Ref Range   Sodium 138 135 - 145 mmol/L   Potassium 4.2 3.5 - 5.1 mmol/L   Chloride 107 98 - 111 mmol/L   CO2 22 22 - 32 mmol/L   Glucose, Bld 125 (H) 70 - 99 mg/dL   BUN 13 8 - 23 mg/dL   Creatinine, Ser 9.21 0.44 - 1.00 mg/dL   Calcium  9.2 8.9 - 10.3 mg/dL   Total Protein 6.3 (L) 6.5 - 8.1 g/dL   Albumin 2.8 (L) 3.5 - 5.0 g/dL   AST 16 15 - 41 U/L   ALT 9 0 -  44 U/L   Alkaline Phosphatase 72 38 - 126 U/L   Total Bilirubin 0.7 0.0 - 1.2 mg/dL   GFR, Estimated >39 >39 mL/min   Anion gap 9 5 - 15  CBC with Differential     Status: Abnormal   Collection Time: 07/20/23 11:11 AM  Result Value Ref Range   WBC 9.8 4.0 - 10.5 K/uL   RBC 4.43 3.87 - 5.11 MIL/uL   Hemoglobin 12.3 12.0 - 15.0 g/dL   HCT 61.7 63.9 - 53.9 %   MCV 86.2 80.0 - 100.0 fL   MCH 27.8 26.0 - 34.0 pg   MCHC 32.2 30.0 - 36.0 g/dL   RDW 86.0 88.4 - 84.4 %   Platelets 402 (H) 150 - 400 K/uL   nRBC 0.0 0.0 - 0.2 %   Neutrophils Relative % 81 %   Neutro Abs 8.0 (H) 1.7 -  7.7 K/uL   Lymphocytes Relative 10 %   Lymphs Abs 1.0 0.7 - 4.0 K/uL   Monocytes Relative 7 %   Monocytes Absolute 0.7 0.1 - 1.0 K/uL   Eosinophils Relative 1 %   Eosinophils Absolute 0.1 0.0 - 0.5 K/uL   Basophils Relative 1 %   Basophils Absolute 0.1 0.0 - 0.1 K/uL   Immature Granulocytes 0 %   Abs Immature Granulocytes 0.03 0.00 - 0.07 K/uL  Resp panel by RT-PCR (RSV, Flu A&B, Covid) Anterior Nasal Swab     Status: None   Collection Time: 07/20/23 11:11 AM   Specimen: Anterior Nasal Swab  Result Value Ref Range   SARS Coronavirus 2 by RT PCR NEGATIVE NEGATIVE   Influenza A by PCR NEGATIVE NEGATIVE   Influenza B by PCR NEGATIVE NEGATIVE   Resp Syncytial Virus by PCR NEGATIVE NEGATIVE                Troponin I (High Sensitivity)     Status: None   Collection Time: 07/20/23 11:11 AM  Result Value Ref Range   Troponin I (High Sensitivity) 3 <18 ng/L  Urinalysis, w/ Reflex to Culture (Infection Suspected) -Urine, Clean Catch     Status: Abnormal   Collection Time: 07/20/23 12:42 PM  Result Value Ref Range   Specimen Source URINE, CLEAN CATCH    Color, Urine YELLOW YELLOW   APPearance HAZY (A) CLEAR   Specific Gravity, Urine 1.023 1.005 - 1.030   pH 5.0 5.0 - 8.0   Glucose, UA NEGATIVE NEGATIVE mg/dL   Hgb urine dipstick NEGATIVE NEGATIVE   Bilirubin Urine NEGATIVE NEGATIVE   Ketones, ur NEGATIVE NEGATIVE mg/dL   Protein, ur NEGATIVE NEGATIVE mg/dL   Nitrite NEGATIVE NEGATIVE   Leukocytes,Ua SMALL (A) NEGATIVE   RBC / HPF 0-5 0 - 5 RBC/hpf   WBC, UA 6-10 0 - 5 WBC/hpf   Bacteria, UA RARE (A) NONE SEEN   Squamous Epithelial / HPF 0-5 0 - 5 /HPF   Mucus PRESENT    Hyaline Casts, UA PRESENT   Troponin I (High Sensitivity)     Status: None   Collection Time: 07/20/23 12:48 PM  Result Value Ref Range   Troponin I (High Sensitivity) 3 <18 ng/L  Protime-INR     Status: Abnormal   Collection Time: 07/20/23  2:10 PM  Result Value Ref Range   Prothrombin Time 34.7 (H) 11.4 -  15.2 seconds   INR 3.4 (H) 0.8 - 1.2  D-dimer, quantitative     Status: Abnormal   Collection Time: 07/20/23  4:51 PM  Result  Value Ref Range   D-Dimer, Quant 1.00 (H) 0.00 - 0.50 ug/mL-FEU   Recent Results (from the past 720 hours)  Resp panel by RT-PCR (RSV, Flu A&B, Covid) Anterior Nasal Swab     Status: None   Collection Time: 07/20/23 11:11 AM   Specimen: Anterior Nasal Swab  Result Value Ref Range Status   SARS Coronavirus 2 by RT PCR NEGATIVE NEGATIVE Final   Influenza A by PCR NEGATIVE NEGATIVE Final   Influenza B by PCR NEGATIVE NEGATIVE Final    Comment: (NOTE) The Xpert Xpress SARS-CoV-2/FLU/RSV plus assay is intended as an aid in the diagnosis of influenza from Nasopharyngeal swab specimens and should not be used as a sole basis for treatment. Nasal washings and aspirates are unacceptable for Xpert Xpress SARS-CoV-2/FLU/RSV testing.  Fact Sheet for Patients: bloggercourse.com  Fact Sheet for Healthcare Providers: seriousbroker.it  This test is not yet approved or cleared by the [place]  FDA and has been authorized for detection and/or diagnosis of SARS-CoV-2 by FDA under an Emergency Use Authorization (EUA). This EUA will remain in effect (meaning this test can be used) for the duration of the COVID-19 declaration under Section 564(b)(1) of the Act, 21 U.S.C. section 360bbb-3(b)(1), unless the authorization is terminated or revoked.     Resp Syncytial Virus by PCR NEGATIVE NEGATIVE Final    Comment: (NOTE) Fact Sheet for Patients: bloggercourse.com  Fact Sheet for Healthcare Providers: seriousbroker.it  This test is not yet approved or cleared by the [place]  FDA and has been authorized for detection and/or diagnosis of SARS-CoV-2 by FDA under an Emergency Use Authorization (EUA). This EUA will remain in effect (meaning this test can be used) for the  duration of the COVID-19 declaration under Section 564(b)(1) of the Act, 21 U.S.C. section 360bbb-3(b)(1), unless the authorization is terminated or revoked.  Performed at [facility], [address] [place], [place] [address]    CBC:    Latest Ref Rng & Units 07/20/2023   11:11 AM 04/08/2023    7:24 AM 04/07/2023   11:24 AM  CBC  WBC 4.0 - 10.5 K/uL 9.8  4.1  4.7   Hemoglobin 12.0 - 15.0 g/dL 87.6  87.5  86.8   Hematocrit 36.0 - 46.0 % 38.2  37.9  39.8   Platelets 150 - 400 K/uL 402  176  183    Basic Metabolic Panel: Recent Labs  Lab 07/20/23 1111  NA 138  K 4.2  CL 107  CO2 22  GLUCOSE 125*  BUN 13  CREATININE 0.78  CALCIUM  9.2   Creatinine: Lab Results  Component Value Date   CREATININE 0.78 07/20/2023   CREATININE 0.94 04/08/2023   CREATININE 0.67 04/05/2023    Liver Function Tests:    Latest Ref Rng & Units 07/20/2023   11:11 AM 04/02/2023    5:30 AM 11/02/2021   12:47 PM  Hepatic Function  Total Protein 6.5 - 8.1 g/dL 6.3  6.8  6.8   Albumin 3.5 - 5.0 g/dL 2.8  3.7  4.1   AST 15 - 41 U/L 16  23  22    ALT 0 - 44 U/L 9  13  14    Alk Phosphatase 38 - 126 U/L 72  84  85   Total Bilirubin 0.0 - 1.2 mg/dL 0.7  0.6  0.4    Coagulation Profile: Recent Labs  Lab 07/20/23 1410  INR 3.4*   Cardiac Enzymes: Recent Labs  Lab 07/20/23 1111  CKTOTAL 20*  BNP (last 3 results) No results for input(s): PROBNP in the last 8760 hours. HbA1C: No results for input(s): HGBA1C in the last 72 hours. Lipid Profile: No results for input(s): CHOL, HDL, LDLCALC, TRIG, CHOLHDL, LDLDIRECT in the last 72 hours. Urinalysis    Component Value Date/Time   COLORURINE YELLOW 07/20/2023 1242   APPEARANCEUR HAZY (A) 07/20/2023 1242   LABSPEC 1.023 07/20/2023 1242   LABSPEC 1.020 04/17/2006 1216   PHURINE 5.0 07/20/2023 1242   GLUCOSEU NEGATIVE 07/20/2023 1242   HGBUR NEGATIVE 07/20/2023 1242   BILIRUBINUR NEGATIVE 07/20/2023 1242   BILIRUBINUR  Negative 04/17/2006 1216   KETONESUR NEGATIVE 07/20/2023 1242   PROTEINUR NEGATIVE 07/20/2023 1242   UROBILINOGEN 0.2 12/03/2012 2214   NITRITE NEGATIVE 07/20/2023 1242   LEUKOCYTESUR SMALL (A) 07/20/2023 1242   LEUKOCYTESUR Negative 04/17/2006 1216     Unresulted Labs (From admission, onward)     Start     Ordered   07/21/23 0500  Protime-INR  Daily,   R      07/20/23 1614   07/21/23 0500  Comprehensive metabolic panel  Tomorrow morning,   R        07/20/23 1616   07/21/23 0500  CBC  Tomorrow morning,   R        07/20/23 1616            Radiological Exams on Admission: CT HEAD WO CONTRAST ( ) Result Date: 07/20/2023 CLINICAL DATA:  Altered mental status EXAM: CT HEAD WITHOUT CONTRAST TECHNIQUE: Contiguous axial images were obtained from the base of the skull through the vertex without intravenous contrast. RADIATION DOSE REDUCTION: This exam was performed according to the departmental dose-optimization program which includes automated exposure control, adjustment of the mA and/or kV according to patient size and/or use of iterative reconstruction technique. COMPARISON:  12/05/2012 FINDINGS: Brain: No evidence of acute infarction, hemorrhage, hydrocephalus, extra-axial collection or mass lesion/mass effect. Age related atrophy. Old left parietal infarct. Subcortical white matter and periventricular small vessel ischemic changes. Vascular: Mild intracranial atherosclerosis. Skull: Normal. Negative for fracture or focal lesion. Sinuses/Orbits: The visualized paranasal sinuses are essentially clear. The mastoid air cells are unopacified. Other: None. IMPRESSION: No acute intracranial abnormality. Old left parietal infarct. Atrophy with small vessel ischemic changes. Electronically Signed   By: Pinkie Pebbles M.D.   On: 07/20/2023 19:17   DG Chest Port 1 View Result Date: 07/20/2023 CLINICAL DATA:  Hypoxia.  Weakness. EXAM: PORTABLE CHEST 1 VIEW COMPARISON:  04/07/2023 FINDINGS: Lung  volumes are low. Upper normal heart size. Normal mediastinal contours. There mitral annulus calcifications. A skin fold projects over the right lateral hemithorax. No focal airspace disease, pulmonary edema, large pleural effusion or pneumothorax. Left axillary surgical clips. IMPRESSION: Low lung volumes without acute chest finding. Electronically Signed   By: Andrea Gasman M.D.   On: 07/20/2023 15:10   DG Wrist 2 Views Right Result Date: 07/20/2023 CLINICAL DATA:  Pain. EXAM: RIGHT WRIST - 2 VIEW COMPARISON:  None Available. FINDINGS: No acute fracture. Appears contracted. Abnormal alignment of the thumb carpal metacarpal joint which appears subluxed, of unknown acuity. Joint space narrowing and subchondral changes at the thumb carpal metacarpal joint. Borderline widening of the scapholunate interval at 3 mm. No erosive or bony destructive change. Mild generalized soft tissue edema. IMPRESSION: 1. Abnormal alignment of the thumb carpometacarpal joint which appears subluxed, of unknown acuity.The hand appears to be contracted. Recommend correlation with physical exam. 2. Borderline widening of the scapholunate interval at 3 mm can be  seen with ligamentous deficiency. Electronically Signed   By: Andrea Gasman M.D.   On: 07/20/2023 15:09   DG Elbow 2 Views Right Result Date: 07/20/2023 CLINICAL DATA:  Worsening pain.  Right elbow pain. EXAM: RIGHT ELBOW - 2 VIEW COMPARISON:  None Available. FINDINGS: Technically limited by positioning. No evidence of fracture. There is a prominent anterior fat pad but no definite posterior fat pad displacement to suggest effusion. No erosions. Tiny olecranon spur. No focal soft tissue abnormalities. IMPRESSION: 1. No acute findings. 2. Tiny olecranon spur. 3. Prominent anterior fat pad without definite joint effusion. Electronically Signed   By: Andrea Gasman M.D.   On: 07/20/2023 15:07   DG Shoulder Right Portable Result Date: 07/20/2023 CLINICAL DATA:  Worsening  pain.  Right shoulder pain. EXAM: RIGHT SHOULDER - 1 VIEW COMPARISON:  None Available. FINDINGS: There is no evidence of fracture or dislocation. Acromioclavicular degenerative change with joint space narrowing and inferior spurring. No erosions or focal bone abnormality. Soft tissues are unremarkable. IMPRESSION: Mild to moderate glenohumeral osteoarthritis. Electronically Signed   By: Andrea Gasman M.D.   On: 07/20/2023 15:05    Data Reviewed: Relevant notes from primary care and specialist visits, past discharge summaries as available in EHR, including Care Everywhere. Prior diagnostic testing as pertinent to current admission diagnoses, Updated medications and problem lists for reconciliation ED course, including vitals, labs, imaging, treatment and response to treatment,Triage notes, nursing and pharmacy notes and ED provider's notes Notable results as noted in HPI.Discussed case with EDMD/ ED APP/ or Specialty MD on call and as needed.  Assessment and Plan: * Generalized weakness PT consult.  Will get TFT / B12 / CPK .    Atrial fibrillation (HCC) EKG today is SR. Pt is on coumadin .  We will continue same and current pt's INR is  3.4.    SIRS (systemic inflammatory response syndrome) (HCC) Pt meets SIRS with vitals and possible sepsis.  Will follow culture. Continue keflex .     UTI (urinary tract infection) Mild cystitis pt is asymptomatic. Will follow culture and treat. We will cont keflex  started in ed.  Urinalysis    Component Value Date/Time   COLORURINE YELLOW 07/20/2023 1242   APPEARANCEUR HAZY (A) 07/20/2023 1242   LABSPEC 1.023 07/20/2023 1242   LABSPEC 1.020 04/17/2006 1216   PHURINE 5.0 07/20/2023 1242   GLUCOSEU NEGATIVE 07/20/2023 1242   HGBUR NEGATIVE 07/20/2023 1242   BILIRUBINUR NEGATIVE 07/20/2023 1242   BILIRUBINUR Negative 04/17/2006 1216   KETONESUR NEGATIVE 07/20/2023 1242   PROTEINUR NEGATIVE 07/20/2023 1242   UROBILINOGEN 0.2 12/03/2012 2214    NITRITE NEGATIVE 07/20/2023 1242   LEUKOCYTESUR SMALL (A) 07/20/2023 1242   LEUKOCYTESUR Negative 04/17/2006 1216       Essential hypertension, benign Vitals:   07/20/23 1130 07/20/23 1200 07/20/23 1230 07/20/23 1300  BP: (!) 111/57 (!) 114/54 (!) 117/56 (!) 112/52   07/20/23 1330 07/20/23 1400 07/20/23 1430 07/20/23 1500  BP: (!) 108/53 109/60 114/62 104/62   07/20/23 1630 07/20/23 1700 07/20/23 1845 07/20/23 1925  BP: 123/62 (!) 103/55 (!) 113/57 129/79  Stable. No BP meds PTA.    DVT prophylaxis:  Coumadin   Consults:  None  Advance Care Planning:    Code Status: Full Code   Family Communication:  Son at bedside.  Disposition Plan:  TBD Severity of Illness: The appropriate patient status for this patient is OBSERVATION. Observation status is judged to be reasonable and necessary in order to provide the required intensity of service  to ensure the patient's safety. The patient's presenting symptoms, physical exam findings, and initial radiographic and laboratory data in the context of their medical condition is felt to place them at decreased risk for further clinical deterioration. Furthermore, it is anticipated that the patient will be medically stable for discharge from the hospital within 2 midnights of admission.   Author: Mario LULLA Blanch, MD 07/20/2023 8:03 PM  For on call review [email or web address].   Orders Placed This Encounter  Procedures   Resp panel by RT-PCR (RSV, Flu A&B, Covid) Anterior Nasal Swab   DG Shoulder Right Portable   DG Elbow 2 Views Right   DG Wrist 2 Views Right   DG Chest Port 1 View   CT HEAD WO CONTRAST ( )   Comprehensive metabolic panel   CBC with Differential   Urinalysis, w/ Reflex to Culture (Infection Suspected) -Urine, Clean Catch   CK   Protime-INR   D-dimer, quantitative   Protime-INR   Comprehensive metabolic panel   CBC   TSH   Vitamin B12   T4, free   Diet Heart Room service appropriate? Yes; Fluid consistency: Thin    Ambulate patient   Check Pulse Oximetry while ambulating   Measure blood pressure   Maintain IV access   Vital signs   Notify physician (specify)   Mobility Protocol: No Restrictions   Refer to Sidebar Report Refer to ICU, Med-Surg, Progressive, and Step-Down Mobility Protocol Sidebars   Initiate Adult Central Line Maintenance and Catheter Protocol for patients with central line (CVC, PICC, Port, Hemodialysis, Trialysis)   Daily weights   Intake and Output   Do not place and if present remove PureWick   Initiate Oral Care Protocol   Initiate Carrier Fluid Protocol   RN may order General Admission PRN Orders utilizing General Admission PRN medications (through manage orders) for the following patient needs: allergy symptoms (Claritin ), cold sores (Carmex), cough (Robitussin DM), eye irritation (Liquifilm Tears), hemorrhoids (Tucks), indigestion (Maalox), minor skin irritation (Hydrocortisone Cream), muscle pain Lucienne Gay), nose irritation (saline nasal spray) and sore throat (Chloraseptic spray).   Cardiac Monitoring Continuous x 48 hours Indications for use: Other; Other indications for use: a.fib   Full code   Consult for Unassigned Medical Admission   Consult to hospitalist   warfarin (COUMADIN ) per pharmacy consult   Pulse oximetry check with vital signs   Oxygen therapy Mode or (Route): Nasal cannula; Liters Per Minute: 2; Keep O2 saturation between: greater than 92 %   EKG 12-Lead   Place in observation (patient's expected length of stay will be less than 2 midnights)   Aspiration precautions   Fall precautions

## 2023-07-21 ENCOUNTER — Observation Stay (HOSPITAL_COMMUNITY): Payer: Medicare Other

## 2023-07-21 DIAGNOSIS — R791 Abnormal coagulation profile: Secondary | ICD-10-CM | POA: Diagnosis not present

## 2023-07-21 DIAGNOSIS — I771 Stricture of artery: Secondary | ICD-10-CM | POA: Diagnosis not present

## 2023-07-21 DIAGNOSIS — R29818 Other symptoms and signs involving the nervous system: Secondary | ICD-10-CM | POA: Diagnosis not present

## 2023-07-21 DIAGNOSIS — R531 Weakness: Secondary | ICD-10-CM | POA: Diagnosis not present

## 2023-07-21 DIAGNOSIS — M4187 Other forms of scoliosis, lumbosacral region: Secondary | ICD-10-CM | POA: Diagnosis not present

## 2023-07-21 DIAGNOSIS — M4317 Spondylolisthesis, lumbosacral region: Secondary | ICD-10-CM | POA: Diagnosis not present

## 2023-07-21 DIAGNOSIS — M5116 Intervertebral disc disorders with radiculopathy, lumbar region: Secondary | ICD-10-CM | POA: Diagnosis not present

## 2023-07-21 DIAGNOSIS — M549 Dorsalgia, unspecified: Secondary | ICD-10-CM | POA: Diagnosis not present

## 2023-07-21 LAB — COMPREHENSIVE METABOLIC PANEL
ALT: 10 U/L (ref 0–44)
AST: 13 U/L — ABNORMAL LOW (ref 15–41)
Albumin: 2.5 g/dL — ABNORMAL LOW (ref 3.5–5.0)
Alkaline Phosphatase: 61 U/L (ref 38–126)
Anion gap: 6 (ref 5–15)
BUN: 14 mg/dL (ref 8–23)
CO2: 24 mmol/L (ref 22–32)
Calcium: 9 mg/dL (ref 8.9–10.3)
Chloride: 108 mmol/L (ref 98–111)
Creatinine, Ser: 0.86 mg/dL (ref 0.44–1.00)
GFR, Estimated: >60 mL/min (ref >60)
Glucose, Bld: 92 mg/dL (ref 70–99)
Potassium: 3.9 mmol/L (ref 3.5–5.1)
Sodium: 138 mmol/L (ref 135–145)
Total Bilirubin: 0.6 mg/dL (ref 0.0–1.2)
Total Protein: 5.8 g/dL — ABNORMAL LOW (ref 6.5–8.1)

## 2023-07-21 LAB — CBC
HCT: 35.2 % — ABNORMAL LOW (ref 36.0–46.0)
Hemoglobin: 11 g/dL — ABNORMAL LOW (ref 12.0–15.0)
MCH: 27.4 pg (ref 26.0–34.0)
MCHC: 31.3 g/dL (ref 30.0–36.0)
MCV: 87.8 fL (ref 80.0–100.0)
Platelets: 370 10*3/uL (ref 150–400)
RBC: 4.01 MIL/uL (ref 3.87–5.11)
RDW: 14.2 % (ref 11.5–15.5)
WBC: 7.7 10*3/uL (ref 4.0–10.5)
nRBC: 0 % (ref 0.0–0.2)

## 2023-07-21 LAB — PROTIME-INR
INR: 4.2 (ref 0.8–1.2)
Prothrombin Time: 40.8 s — ABNORMAL HIGH (ref 11.4–15.2)

## 2023-07-21 MED ORDER — ALBUTEROL SULFATE (2.5 MG/3ML) 0.083% IN NEBU: 2.5000 mg | INHALATION_SOLUTION | Freq: Four times a day (QID) | RESPIRATORY_TRACT | Status: DC | PRN

## 2023-07-21 NOTE — ED Notes (Signed)
 MD Margo Aye updated on critical INR. Pharmacy consult placed by MD

## 2023-07-21 NOTE — Care Management Obs Status (Signed)
 MEDICARE OBSERVATION STATUS NOTIFICATION   Patient Details  Name: PALESTINE MOSCO MRN: [id number] Date of Birth: [birth year]   Medicare Observation Status Notification Given:  Yes    Lawerance Sabal, RN 07/21/2023, 1:37 PM

## 2023-07-21 NOTE — ED Notes (Signed)
 PT well appearing at time of transport to xray then to floor.

## 2023-07-21 NOTE — Progress Notes (Signed)
 PHARMACY - ANTICOAGULATION CONSULT NOTE  Pharmacy Consult for warfarin Indication: atrial fibrillation  Allergies  Allergen Reactions   Dilaudid  [Hydromorphone  Hcl] Shortness Of Breath    Tolerates tramadol    Hydromorphone  Other (See Comments)    UNKNOWN TO PATIENT   Morphine  Other (See Comments)    UNKNOWN TO PATIENT   Morphine  And Codeine Shortness Of Breath    Tolerates tramadol    Sulfamethoxazole Other (See Comments)    UNKNOWN TO PATIENT   Sulfa Antibiotics Other (See Comments)    Made me drunk;  wobbly   Keflex  [Cephalexin ] Nausea Only   Levaquin  [Levofloxacin ] Hives and Rash    Patient Measurements: Height: 5' 3 (160 cm) Weight: 66.2 kg (145 lb 15.1 oz) IBW/kg (Calculated) : 52.4   Vital Signs: Temp: 97.8 F (36.6 C) (01/12 0814) Temp Source: Oral (01/12 0814) BP: 108/59 (01/12 0900) Pulse Rate: 74 (01/12 0900)  Labs: Recent Labs    07/20/23 1111 07/20/23 1248 07/20/23 1410 07/21/23 0440  HGB 12.3  --   --  11.0*  HCT 38.2  --   --  35.2*  PLT 402*  --   --  370  LABPROT  --   --  34.7* 40.8*  INR  --   --  3.4* 4.2*  CREATININE 0.78  --   --  0.86  CKTOTAL 20*  --   --   --   TROPONINIHS 3 3  --   --     Estimated Creatinine Clearance: 42.1 mL/min (by C-G formula based on SCr of 0.86 mg/dL).   Medical History: Past Medical History:  Diagnosis Date   ASD (atrial septal defect)    Cancer (HCC)    breast, left   Carpal tunnel syndrome, bilateral    Depression    Gait disorder    GERD (gastroesophageal reflux disease)    Glaucoma    Glaucoma    History of stroke    Hypertension    IBS (irritable bowel syndrome)    Personal history of radiation therapy 1996   S/P bunionectomy    bilateral    Assessment: 35 YOF presenting with weakness, hx of afib on warfarin PTA with last dose taken 1/10, INR on admission is supratherapeutic at 3.4  PTA dosing:  2mg  daily except 3mg  on Wed  INR this AM trended up from 3.4 to 4.2  Goal of  Therapy:  INR 2-3 Monitor platelets by anticoagulation protocol: Yes   Plan:  Continue to hold warfarin given supratherapeutic INR Daily INR, s/s bleeding  Dorn Poot, PharmD, [facility] Clinical Pharmacist ED Pharmacist Phone # [phone] 07/21/2023 10:20 AM

## 2023-07-21 NOTE — Progress Notes (Signed)
  Progress Note   Patient: Renee Adams FMW:[id number] DOB: 1934-07-07 DOA: 07/20/2023     0 DOS: the patient was seen and examined on 07/21/2023   Brief hospital course: 89yo with hx afib on chronic anticoagulation, anxiety , HTN, ASD, hx breast ca presented to ED with increased generalized weakness.  Pt initially reported progressive weakness on trying to get to and from mailbox. On the morning of presentation, pt recalls being unable to get out of bed, states having no strength in her L leg. Denied acute UE weakness. Pt did report on-going issues regarding R shoulder pains  Assessment and Plan: Generalized weakness -Unclear etiology -CT head neg for acute process. 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 No acute infarct -Ordered and reviewed lumbar xray. Rightward lumbar scoliosis with diffuse DJD noted -have consulted PT/OT  Arthritis -extensive arthritic hand joint deformities noted on exam -OT consulted  Afib -Currently rate controlled -Continue coumadin  per pharmacy dosing, per below -no meds for rate control noted on mar  Chronic anticoagulation -pt noted to have supertherapeutic INR at 4.2 -Pharmacy consulted for dosing  UTI and sepsis ruled out -Pt afebrile, no leukocytosis. Not septic -small Leuks on UA -Pt denies any suprapubic pain or any dysuria, is aysmptomatic, thus will not treat for UTI -Will discontinue further abx at this time  HTN -BP stable at this time  Subjective: Still feels generally weak  Physical Exam: Vitals:   07/21/23 0900 07/21/23 1000 07/21/23 1136 07/21/23 1136  BP: (!) 108/59 115/60  108/85  Pulse: 74 79  82  Resp: 20 (!) 21  16  Temp:    97.9 F (36.6 C)  TempSrc:    Oral  SpO2: 93% 91%  (!) 87%  Weight:   58.3 kg   Height:   5' 2 (1.575 m)    General exam: Awake, laying in bed, in nad Respiratory system: Normal respiratory effort, no wheezing Cardiovascular system: regular  rate, s1, s2 Gastrointestinal system: Soft, nondistended, positive BS Central nervous system: CN2-12 grossly intact, strength intact Extremities: Perfused, no clubbing Skin: Normal skin turgor, no notable skin lesions seen Psychiatry: Mood normal // no visual hallucinations   Data Reviewed:  Labs reviewed: Na 138, k 3.9, Cr 0.86, WBC 7.7, Hgb 11.0, Plts 14.2  Family Communication: Pt in room, family not at bedside  Disposition: Status is: Observation The patient remains OBS appropriate and will d/c before 2 midnights.  Planned Discharge Destination:  Awaiting PT/OT    Author: Garnette Pelt, MD 07/21/2023 3:30 PM  For on call review [email or web address].

## 2023-07-21 NOTE — ED Notes (Signed)
 ED TO INPATIENT HANDOFF REPORT  ED Nurse Name and Phone #: Aloma RN [phone]  S Name/Age/Gender Renee Adams 89 y.o. female Room/Bed: 041C/041C  Code Status   Code Status: Full Code  Home/SNF/Other Home Patient oriented to: self, place, time, and situation Is this baseline? Yes   Triage Complete: Triage complete  Chief Complaint Generalized weakness [R53.1]  Triage Note Pt arrived via [facility] from home for c/o generalized weakness started upon waking at 0530. Pt c/o worse pain in right shoulder that radiates down right arm.    Allergies Allergies  Allergen Reactions   Dilaudid  [Hydromorphone  Hcl] Shortness Of Breath    Tolerates tramadol    Hydromorphone  Other (See Comments)    UNKNOWN TO PATIENT   Morphine  Other (See Comments)    UNKNOWN TO PATIENT   Morphine  And Codeine Shortness Of Breath    Tolerates tramadol    Sulfamethoxazole Other (See Comments)    UNKNOWN TO PATIENT   Sulfa Antibiotics Other (See Comments)    Made me drunk;  wobbly   Keflex  [Cephalexin ] Nausea Only   Levaquin  [Levofloxacin ] Hives and Rash    Level of Care/Admitting Diagnosis ED Disposition     ED Disposition  Admit   Condition  --   Comment  Hospital Area: [facility] [[id number]]  Level of Care: Telemetry Medical [104]  May place patient in observation at [facility] or [facility] if equivalent level of care is available:: Yes  Covid Evaluation: Asymptomatic - no recent exposure (last 10 days) testing not required  Diagnosis: Generalized weakness [312262]  Admitting Physician: TOBIE MARIO LULLA DEDRA  Attending Physician: TOBIE MARIO LULLA DEDRA          B Medical/Surgery History Past Medical History:  Diagnosis Date   ASD (atrial septal defect)    Cancer (HCC)    breast, left   Carpal tunnel syndrome, bilateral    Depression    Gait disorder    GERD (gastroesophageal reflux disease)    Glaucoma    Glaucoma    History of stroke    Hypertension     IBS (irritable bowel syndrome)    Personal history of radiation therapy 1996   S/P bunionectomy    bilateral   Past Surgical History:  Procedure Laterality Date   ABDOMINAL HYSTERECTOMY     bladder resuspension     BREAST BIOPSY Left    BREAST BIOPSY Right    BREAST LUMPECTOMY Left 1996   CATARACT EXTRACTION Bilateral    INCISION AND DRAINAGE HIP  03/31/2012   Procedure: IRRIGATION AND DEBRIDEMENT HIP WITH POLY EXCHANGE;  Surgeon: Garnette JONETTA Raman, MD;  Location: [facility] OR;  Service: Orthopedics;  Laterality: Left;   ORIF FEMUR FRACTURE Right 11/30/2012   Procedure: OPEN REDUCTION INTERNAL FIXATION (ORIF) DISTAL FEMUR FRACTURE;  Surgeon: Marcey Raman, MD;  Location: [facility] OR;  Service: Orthopedics;  Laterality: Right;   TOTAL HIP ARTHROPLASTY Bilateral    TOTAL KNEE ARTHROPLASTY Bilateral      A IV Location/Drains/Wounds Patient Lines/Drains/Airways Status     Active Line/Drains/Airways     Name Placement date Placement time Site Days   Peripheral IV 07/20/23 20 G Anterior;Right Forearm 07/20/23  1110  Forearm  1            Intake/Output Last 24 hours No intake or output data in the 24 hours ending 07/21/23 0946  Labs/Imaging Results for orders placed or performed during the hospital encounter of 07/20/23 (from the past 48 hours)  Comprehensive metabolic  panel     Status: Abnormal   Collection Time: 07/20/23 11:11 AM  Result Value Ref Range   Sodium 138 135 - 145 mmol/L   Potassium 4.2 3.5 - 5.1 mmol/L   Chloride 107 98 - 111 mmol/L   CO2 22 22 - 32 mmol/L   Glucose, Bld 125 (H) 70 - 99 mg/dL    Comment: Glucose reference range applies only to samples taken after fasting for at least 8 hours.   BUN 13 8 - 23 mg/dL   Creatinine, Ser 9.21 0.44 - 1.00 mg/dL   Calcium  9.2 8.9 - 10.3 mg/dL   Total Protein 6.3 (L) 6.5 - 8.1 g/dL   Albumin 2.8 (L) 3.5 - 5.0 g/dL   AST 16 15 - 41 U/L   ALT 9 0 - 44 U/L   Alkaline Phosphatase 72 38 - 126 U/L   Total Bilirubin 0.7 0.0 - 1.2  mg/dL   GFR, Estimated >39 >39 mL/min    Comment: (NOTE) Calculated using the CKD-EPI Creatinine Equation (2021)    Anion gap 9 5 - 15    Comment: Performed at [facility], [address] [place], [place] [address]  CBC with Differential     Status: Abnormal   Collection Time: 07/20/23 11:11 AM  Result Value Ref Range   WBC 9.8 4.0 - 10.5 K/uL   RBC 4.43 3.87 - 5.11 MIL/uL   Hemoglobin 12.3 12.0 - 15.0 g/dL   HCT 61.7 63.9 - 53.9 %   MCV 86.2 80.0 - 100.0 fL   MCH 27.8 26.0 - 34.0 pg   MCHC 32.2 30.0 - 36.0 g/dL   RDW 86.0 88.4 - 84.4 %   Platelets 402 (H) 150 - 400 K/uL   nRBC 0.0 0.0 - 0.2 %   Neutrophils Relative % 81 %   Neutro Abs 8.0 (H) 1.7 - 7.7 K/uL   Lymphocytes Relative 10 %   Lymphs Abs 1.0 0.7 - 4.0 K/uL   Monocytes Relative 7 %   Monocytes Absolute 0.7 0.1 - 1.0 K/uL   Eosinophils Relative 1 %   Eosinophils Absolute 0.1 0.0 - 0.5 K/uL   Basophils Relative 1 %   Basophils Absolute 0.1 0.0 - 0.1 K/uL   Immature Granulocytes 0 %   Abs Immature Granulocytes 0.03 0.00 - 0.07 K/uL    Comment: Performed at [facility], [address] [place], [place] [address]  Resp panel by RT-PCR (RSV, Flu A&B, Covid) Anterior Nasal Swab     Status: None   Collection Time: 07/20/23 11:11 AM   Specimen: Anterior Nasal Swab  Result Value Ref Range   SARS Coronavirus 2 by RT PCR NEGATIVE NEGATIVE   Influenza A by PCR NEGATIVE NEGATIVE   Influenza B by PCR NEGATIVE NEGATIVE    Comment: (NOTE) The Xpert Xpress SARS-CoV-2/FLU/RSV plus assay is intended as an aid in the diagnosis of influenza from Nasopharyngeal swab specimens and should not be used as a sole basis for treatment. Nasal washings and aspirates are unacceptable for Xpert Xpress SARS-CoV-2/FLU/RSV testing.  Fact Sheet for Patients: bloggercourse.com  Fact Sheet for Healthcare Providers: seriousbroker.it  This test is not yet approved or cleared by the  [place]  FDA and has been authorized for detection and/or diagnosis of SARS-CoV-2 by FDA under an Emergency Use Authorization (EUA). This EUA will remain in effect (meaning this test can be used) for the duration of the COVID-19 declaration under Section 564(b)(1) of the Act, 21 U.S.C. section  360bbb-3(b)(1), unless the authorization is terminated or revoked.     Resp Syncytial Virus by PCR NEGATIVE NEGATIVE    Comment: (NOTE) Fact Sheet for Patients: bloggercourse.com  Fact Sheet for Healthcare Providers: seriousbroker.it  This test is not yet approved or cleared by the [place]  FDA and has been authorized for detection and/or diagnosis of SARS-CoV-2 by FDA under an Emergency Use Authorization (EUA). This EUA will remain in effect (meaning this test can be used) for the duration of the COVID-19 declaration under Section 564(b)(1) of the Act, 21 U.S.C. section 360bbb-3(b)(1), unless the authorization is terminated or revoked.  Performed at [facility], [address], [place], [place] [address]   CK     Status: Abnormal   Collection Time: 07/20/23 11:11 AM  Result Value Ref Range   Total CK 20 (L) 38 - 234 U/L    Comment: Performed at [facility], [address], [place], [place] [address]  Troponin I (High Sensitivity)     Status: None   Collection Time: 07/20/23 11:11 AM  Result Value Ref Range   Troponin I (High Sensitivity) 3 <18 ng/L    Comment: (NOTE) Elevated high sensitivity troponin I (hsTnI) values and significant  changes across serial measurements may suggest ACS but many other  chronic and acute conditions are known to elevate hsTnI results.  Refer to the Links section for chest pain algorithms and additional  guidance. Performed at [facility], [address] [place], [place] [address]   Urinalysis, w/ Reflex to Culture (Infection Suspected) -Urine, Clean Catch     Status:  Abnormal   Collection Time: 07/20/23 12:42 PM  Result Value Ref Range   Specimen Source URINE, CLEAN CATCH    Color, Urine YELLOW YELLOW   APPearance HAZY (A) CLEAR   Specific Gravity, Urine 1.023 1.005 - 1.030   pH 5.0 5.0 - 8.0   Glucose, UA NEGATIVE NEGATIVE mg/dL   Hgb urine dipstick NEGATIVE NEGATIVE   Bilirubin Urine NEGATIVE NEGATIVE   Ketones, ur NEGATIVE NEGATIVE mg/dL   Protein, ur NEGATIVE NEGATIVE mg/dL   Nitrite NEGATIVE NEGATIVE   Leukocytes,Ua SMALL (A) NEGATIVE   RBC / HPF 0-5 0 - 5 RBC/hpf   WBC, UA 6-10 0 - 5 WBC/hpf    Comment:        Reflex urine culture not performed if WBC <=10, OR if Squamous epithelial cells >5. If Squamous epithelial cells >5 suggest recollection.    Bacteria, UA RARE (A) NONE SEEN   Squamous Epithelial / HPF 0-5 0 - 5 /HPF   Mucus PRESENT    Hyaline Casts, UA PRESENT     Comment: Performed at [facility], [address], [place], [place] [address]  Troponin I (High Sensitivity)     Status: None   Collection Time: 07/20/23 12:48 PM  Result Value Ref Range   Troponin I (High Sensitivity) 3 <18 ng/L    Comment: (NOTE) Elevated high sensitivity troponin I (hsTnI) values and significant  changes across serial measurements may suggest ACS but many other  chronic and acute conditions are known to elevate hsTnI results.  Refer to the Links section for chest pain algorithms and additional  guidance. Performed at [facility], [address], [place], [place] [address]   Protime-INR     Status: Abnormal   Collection Time: 07/20/23  2:10 PM  Result Value Ref Range   Prothrombin Time 34.7 (H) 11.4 - 15.2 seconds  INR 3.4 (H) 0.8 - 1.2    Comment: (NOTE) INR goal varies based on device and disease states. Performed at [facility], [address], [place], [place] [address]   D-dimer, quantitative     Status: Abnormal   Collection Time: 07/20/23  4:51 PM  Result Value Ref Range   D-Dimer, Quant 1.00 (H) 0.00 -  0.50 ug/mL-FEU    Comment: (NOTE) At the manufacturer cut-off value of 0.5 g/mL FEU, this assay has a negative predictive value of 95-100%.This assay is intended for use in conjunction with a clinical pretest probability (PTP) assessment model to exclude pulmonary embolism (PE) and deep venous thrombosis (DVT) in outpatients suspected of PE or DVT. Results should be correlated with clinical presentation. Performed at [facility], [address] [place], [place] [address]   TSH     Status: None   Collection Time: 07/20/23  4:51 PM  Result Value Ref Range   TSH 1.224 0.350 - 4.500 uIU/mL    Comment: Performed by a 3rd Generation assay with a functional sensitivity of <=0.01 uIU/mL. Performed at [facility], [address] [place], [place] [address]   Vitamin B12     Status: None   Collection Time: 07/20/23  4:51 PM  Result Value Ref Range   Vitamin B-12 691 180 - 914 pg/mL    Comment: (NOTE) This assay is not validated for testing neonatal or myeloproliferative syndrome specimens for Vitamin B12 levels. Performed at [facility], [address] [place], [place] [address]   T4, free     Status: None   Collection Time: 07/20/23  4:51 PM  Result Value Ref Range   Free T4 1.07 0.61 - 1.12 ng/dL    Comment: (NOTE) Biotin ingestion may interfere with free T4 tests. If the results are inconsistent with the TSH level, previous test results, or the clinical presentation, then consider biotin interference. If needed, order repeat testing after stopping biotin. Performed at [facility], [address] [place], [place] [address]   Protime-INR     Status: Abnormal   Collection Time: 07/21/23  4:40 AM  Result Value Ref Range   Prothrombin Time 40.8 (H) 11.4 - 15.2 seconds   INR 4.2 (HH) 0.8 - 1.2    Comment: REPEATED TO VERIFY CRITICAL RESULT CALLED TO, READ BACK BY AND VERIFIED WITH: CALLED TO CHASTITY CARICO, RN 07/21/23 0530 MG (NOTE) INR goal varies  based on device and disease states. Performed at [facility], [address] [place], [place] [address]   Comprehensive metabolic panel     Status: Abnormal   Collection Time: 07/21/23  4:40 AM  Result Value Ref Range   Sodium 138 135 - 145 mmol/L   Potassium 3.9 3.5 - 5.1 mmol/L   Chloride 108 98 - 111 mmol/L   CO2 24 22 - 32 mmol/L   Glucose, Bld 92 70 - 99 mg/dL    Comment: Glucose reference range applies only to samples taken after fasting for at least 8 hours.   BUN 14 8 - 23 mg/dL   Creatinine, Ser 9.13 0.44 - 1.00 mg/dL   Calcium  9.0 8.9 - 10.3 mg/dL   Total Protein 5.8 (L) 6.5 - 8.1 g/dL   Albumin 2.5 (L) 3.5 - 5.0 g/dL   AST 13 (L) 15 - 41 U/L   ALT 10 0 - 44 U/L   Alkaline Phosphatase 61 38 - 126 U/L   Total  Bilirubin 0.6 0.0 - 1.2 mg/dL   GFR, Estimated >39 >39 mL/min    Comment: (NOTE) Calculated using the CKD-EPI Creatinine Equation (2021)    Anion gap 6 5 - 15    Comment: Performed at [facility], [address], [place], [place] [address]  CBC     Status: Abnormal   Collection Time: 07/21/23  4:40 AM  Result Value Ref Range   WBC 7.7 4.0 - 10.5 K/uL   RBC 4.01 3.87 - 5.11 MIL/uL   Hemoglobin 11.0 (L) 12.0 - 15.0 g/dL   HCT 64.7 (L) 63.9 - 53.9 %   MCV 87.8 80.0 - 100.0 fL   MCH 27.4 26.0 - 34.0 pg   MCHC 31.3 30.0 - 36.0 g/dL   RDW 85.7 88.4 - 84.4 %   Platelets 370 150 - 400 K/uL   nRBC 0.0 0.0 - 0.2 %    Comment: Performed at [facility], [address], [place], [place] [address]   CT HEAD WO CONTRAST ( ) Result Date: 07/20/2023 CLINICAL DATA:  Altered mental status EXAM: CT HEAD WITHOUT CONTRAST TECHNIQUE: Contiguous axial images were obtained from the base of the skull through the vertex without intravenous contrast. RADIATION DOSE REDUCTION: This exam was performed according to the departmental dose-optimization program which includes automated exposure control, adjustment of the mA and/or kV according to patient size and/or use  of iterative reconstruction technique. COMPARISON:  12/05/2012 FINDINGS: Brain: No evidence of acute infarction, hemorrhage, hydrocephalus, extra-axial collection or mass lesion/mass effect. Age related atrophy. Old left parietal infarct. Subcortical white matter and periventricular small vessel ischemic changes. Vascular: Mild intracranial atherosclerosis. Skull: Normal. Negative for fracture or focal lesion. Sinuses/Orbits: The visualized paranasal sinuses are essentially clear. The mastoid air cells are unopacified. Other: None. IMPRESSION: No acute intracranial abnormality. Old left parietal infarct. Atrophy with small vessel ischemic changes. Electronically Signed   By: Pinkie Pebbles M.D.   On: 07/20/2023 19:17   DG Chest Port 1 View Result Date: 07/20/2023 CLINICAL DATA:  Hypoxia.  Weakness. EXAM: PORTABLE CHEST 1 VIEW COMPARISON:  04/07/2023 FINDINGS: Lung volumes are low. Upper normal heart size. Normal mediastinal contours. There mitral annulus calcifications. A skin fold projects over the right lateral hemithorax. No focal airspace disease, pulmonary edema, large pleural effusion or pneumothorax. Left axillary surgical clips. IMPRESSION: Low lung volumes without acute chest finding. Electronically Signed   By: Andrea Gasman M.D.   On: 07/20/2023 15:10   DG Wrist 2 Views Right Result Date: 07/20/2023 CLINICAL DATA:  Pain. EXAM: RIGHT WRIST - 2 VIEW COMPARISON:  None Available. FINDINGS: No acute fracture. Appears contracted. Abnormal alignment of the thumb carpal metacarpal joint which appears subluxed, of unknown acuity. Joint space narrowing and subchondral changes at the thumb carpal metacarpal joint. Borderline widening of the scapholunate interval at 3 mm. No erosive or bony destructive change. Mild generalized soft tissue edema. IMPRESSION: 1. Abnormal alignment of the thumb carpometacarpal joint which appears subluxed, of unknown acuity.The hand appears to be contracted. Recommend  correlation with physical exam. 2. Borderline widening of the scapholunate interval at 3 mm can be seen with ligamentous deficiency. Electronically Signed   By: Andrea Gasman M.D.   On: 07/20/2023 15:09   DG Elbow 2 Views Right Result Date: 07/20/2023 CLINICAL DATA:  Worsening pain.  Right elbow pain. EXAM: RIGHT ELBOW - 2 VIEW COMPARISON:  None Available. FINDINGS: Technically limited by positioning. No evidence of fracture. There is a prominent anterior fat pad but  no definite posterior fat pad displacement to suggest effusion. No erosions. Tiny olecranon spur. No focal soft tissue abnormalities. IMPRESSION: 1. No acute findings. 2. Tiny olecranon spur. 3. Prominent anterior fat pad without definite joint effusion. Electronically Signed   By: Andrea Gasman M.D.   On: 07/20/2023 15:07   DG Shoulder Right Portable Result Date: 07/20/2023 CLINICAL DATA:  Worsening pain.  Right shoulder pain. EXAM: RIGHT SHOULDER - 1 VIEW COMPARISON:  None Available. FINDINGS: There is no evidence of fracture or dislocation. Acromioclavicular degenerative change with joint space narrowing and inferior spurring. No erosions or focal bone abnormality. Soft tissues are unremarkable. IMPRESSION: Mild to moderate glenohumeral osteoarthritis. Electronically Signed   By: Andrea Gasman M.D.   On: 07/20/2023 15:05    Pending Labs Unresulted Labs (From admission, onward)     Start     Ordered   07/21/23 0500  Protime-INR  Daily,   R      07/20/23 1614            Vitals/Pain Today's Vitals   07/21/23 0100 07/21/23 0200 07/21/23 0500 07/21/23 0814  BP: (!) 111/58 (!) 104/54 115/61 (!) 115/59  Pulse: 72 71 73 75  Resp: 20 (!) 21 (!) 24 20  Temp: 98.5 F (36.9 C)  98.1 F (36.7 C) 97.8 F (36.6 C)  TempSrc: Oral   Oral  SpO2: 91% 92% 92% 92%  Weight:      Height:      PainSc:        Isolation Precautions No active isolations  Medications Medications  Warfarin - Pharmacist Dosing Inpatient (has no  administration in time range)  sodium chloride  flush (NS) 0.9 % injection 3 mL (3 mLs Intravenous Given 07/20/23 2212)  acetaminophen  (TYLENOL ) tablet 650 mg (has no administration in time range)    Or  acetaminophen  (TYLENOL ) suppository 650 mg (has no administration in time range)  0.9 %  sodium chloride  infusion ( Intravenous New Bag/Given 07/20/23 1654)  pantoprazole  (PROTONIX ) injection 40 mg (40 mg Intravenous Given 07/20/23 1655)  cephALEXin  (KEFLEX ) capsule 500 mg (500 mg Oral Given 07/21/23 0919)  ALPRAZolam  (XANAX ) tablet 0.25 mg (0.25 mg Oral Given 07/20/23 2212)  sertraline  (ZOLOFT ) tablet 100 mg (100 mg Oral Given 07/21/23 0919)  rOPINIRole  (REQUIP ) tablet 1 mg (1 mg Oral Given 07/20/23 2212)  traMADol  (ULTRAM ) tablet 50 mg (50 mg Oral Given 07/20/23 2212)  traMADol  (ULTRAM ) tablet 50 mg (50 mg Oral Given 07/20/23 1115)  ondansetron  (ZOFRAN -ODT) disintegrating tablet 4 mg (4 mg Oral Given 07/20/23 1115)  cephALEXin  (KEFLEX ) capsule 500 mg (500 mg Oral Given 07/20/23 1436)    Mobility non-ambulatory     Focused Assessments General weakness   R Recommendations: See Admitting Provider Note  Report given to:   Additional Notes:

## 2023-07-22 ENCOUNTER — Observation Stay (HOSPITAL_COMMUNITY): Payer: Medicare Other

## 2023-07-22 DIAGNOSIS — I10 Essential (primary) hypertension: Secondary | ICD-10-CM | POA: Diagnosis present

## 2023-07-22 DIAGNOSIS — M1990 Unspecified osteoarthritis, unspecified site: Secondary | ICD-10-CM | POA: Diagnosis not present

## 2023-07-22 DIAGNOSIS — Q211 Atrial septal defect, unspecified: Secondary | ICD-10-CM | POA: Diagnosis not present

## 2023-07-22 DIAGNOSIS — H9190 Unspecified hearing loss, unspecified ear: Secondary | ICD-10-CM | POA: Diagnosis not present

## 2023-07-22 DIAGNOSIS — K589 Irritable bowel syndrome without diarrhea: Secondary | ICD-10-CM | POA: Diagnosis present

## 2023-07-22 DIAGNOSIS — Z96653 Presence of artificial knee joint, bilateral: Secondary | ICD-10-CM | POA: Diagnosis present

## 2023-07-22 DIAGNOSIS — M25511 Pain in right shoulder: Secondary | ICD-10-CM | POA: Diagnosis present

## 2023-07-22 DIAGNOSIS — Z7401 Bed confinement status: Secondary | ICD-10-CM | POA: Diagnosis not present

## 2023-07-22 DIAGNOSIS — R1310 Dysphagia, unspecified: Secondary | ICD-10-CM | POA: Diagnosis not present

## 2023-07-22 DIAGNOSIS — F32A Depression, unspecified: Secondary | ICD-10-CM | POA: Diagnosis not present

## 2023-07-22 DIAGNOSIS — G8929 Other chronic pain: Secondary | ICD-10-CM | POA: Diagnosis present

## 2023-07-22 DIAGNOSIS — Z923 Personal history of irradiation: Secondary | ICD-10-CM | POA: Diagnosis not present

## 2023-07-22 DIAGNOSIS — M4156 Other secondary scoliosis, lumbar region: Secondary | ICD-10-CM | POA: Diagnosis present

## 2023-07-22 DIAGNOSIS — K219 Gastro-esophageal reflux disease without esophagitis: Secondary | ICD-10-CM | POA: Diagnosis present

## 2023-07-22 DIAGNOSIS — M19011 Primary osteoarthritis, right shoulder: Secondary | ICD-10-CM | POA: Diagnosis present

## 2023-07-22 DIAGNOSIS — R791 Abnormal coagulation profile: Secondary | ICD-10-CM | POA: Diagnosis present

## 2023-07-22 DIAGNOSIS — F419 Anxiety disorder, unspecified: Secondary | ICD-10-CM | POA: Diagnosis not present

## 2023-07-22 DIAGNOSIS — R531 Weakness: Secondary | ICD-10-CM

## 2023-07-22 DIAGNOSIS — I482 Chronic atrial fibrillation, unspecified: Secondary | ICD-10-CM | POA: Diagnosis not present

## 2023-07-22 DIAGNOSIS — I4891 Unspecified atrial fibrillation: Secondary | ICD-10-CM | POA: Diagnosis present

## 2023-07-22 DIAGNOSIS — M549 Dorsalgia, unspecified: Secondary | ICD-10-CM | POA: Diagnosis present

## 2023-07-22 DIAGNOSIS — T7840XD Allergy, unspecified, subsequent encounter: Secondary | ICD-10-CM | POA: Diagnosis not present

## 2023-07-22 DIAGNOSIS — M21831 Other specified acquired deformities of right forearm: Secondary | ICD-10-CM | POA: Diagnosis present

## 2023-07-22 DIAGNOSIS — H409 Unspecified glaucoma: Secondary | ICD-10-CM | POA: Diagnosis present

## 2023-07-22 DIAGNOSIS — G5603 Carpal tunnel syndrome, bilateral upper limbs: Secondary | ICD-10-CM | POA: Diagnosis present

## 2023-07-22 DIAGNOSIS — M19041 Primary osteoarthritis, right hand: Secondary | ICD-10-CM | POA: Diagnosis present

## 2023-07-22 DIAGNOSIS — G2581 Restless legs syndrome: Secondary | ICD-10-CM | POA: Diagnosis present

## 2023-07-22 DIAGNOSIS — Z853 Personal history of malignant neoplasm of breast: Secondary | ICD-10-CM | POA: Diagnosis not present

## 2023-07-22 DIAGNOSIS — R6510 Systemic inflammatory response syndrome (SIRS) of non-infectious origin without acute organ dysfunction: Secondary | ICD-10-CM | POA: Diagnosis not present

## 2023-07-22 DIAGNOSIS — R0602 Shortness of breath: Secondary | ICD-10-CM | POA: Diagnosis not present

## 2023-07-22 DIAGNOSIS — M47816 Spondylosis without myelopathy or radiculopathy, lumbar region: Secondary | ICD-10-CM | POA: Diagnosis present

## 2023-07-22 DIAGNOSIS — Z7901 Long term (current) use of anticoagulants: Secondary | ICD-10-CM | POA: Diagnosis not present

## 2023-07-22 DIAGNOSIS — Z1152 Encounter for screening for COVID-19: Secondary | ICD-10-CM | POA: Diagnosis not present

## 2023-07-22 DIAGNOSIS — M21832 Other specified acquired deformities of left forearm: Secondary | ICD-10-CM | POA: Diagnosis present

## 2023-07-22 DIAGNOSIS — M19042 Primary osteoarthritis, left hand: Secondary | ICD-10-CM | POA: Diagnosis present

## 2023-07-22 LAB — ECHOCARDIOGRAM COMPLETE
AR max vel: 2.84 cm2
AV Area VTI: 2.55 cm2
AV Area mean vel: 2.43 cm2
AV Mean grad: 4 mm[Hg]
AV Peak grad: 7.4 mm[Hg]
Ao pk vel: 1.36 m/s
Area-P 1/2: 4.26 cm2
Calc EF: 68 %
Height: 62 in
MV VTI: 2.13 cm2
S' Lateral: 1.9 cm
Single Plane A2C EF: 69.7 %
Single Plane A4C EF: 66.5 %
Weight: 2052.92 [oz_av]

## 2023-07-22 LAB — PROTIME-INR
INR: 3.3 — ABNORMAL HIGH (ref 0.8–1.2)
Prothrombin Time: 33.5 s — ABNORMAL HIGH (ref 11.4–15.2)

## 2023-07-22 MED ORDER — WARFARIN SODIUM 1 MG PO TABS
1.0000 mg | ORAL_TABLET | Freq: Once | ORAL | Status: DC
  Filled 2023-07-22: qty 1

## 2023-07-22 MED ORDER — WARFARIN 0.5 MG HALF TABLET
0.5000 mg | ORAL_TABLET | Freq: Once | ORAL | Status: AC
  Administered 2023-07-22: 0.5 mg via ORAL
  Filled 2023-07-22: qty 1

## 2023-07-22 MED ORDER — SERTRALINE HCL 100 MG PO TABS
100.0000 mg | ORAL_TABLET | Freq: Every day | ORAL | Status: DC
  Administered 2023-07-23: 100 mg via ORAL
  Filled 2023-07-22: qty 1

## 2023-07-22 NOTE — Evaluation (Signed)
 Occupational Therapy Evaluation Patient Details Name: Renee Adams MRN: [id number] DOB: 1934/02/15 Today's Date: 07/22/2023   History of Present Illness Pt is an 89 y.o. female admitted with generalized weakness. In ED, she reported having no strength in her L leg but denied LUE weakness. 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 No acute infarct. Pt also with c/o extensive R shoulder pain. Xray revealed OA, no fracture. PMH:  a fib, anxiety, HTN, ASD, hx breast ca   Clinical Impression   Pt lives alone with her son visiting 2-3 times a week and assisting with some IADLs. She walks with a rollator and is modified independent in self care, light meal prep (eats very little) and light housekeeping. Pt presents with limited R shoulder ROM, B hand arthritic deformities, R knee pain, generalized weakness and decreased standing balance. She requires min to total assist for ADLs and +2 min assist for ambulation in room with RW. Patient will benefit from continued inpatient follow up therapy, <3 hours/day.      If plan is discharge home, recommend the following: Two people to help with walking and/or transfers;A lot of help with bathing/dressing/bathroom;Assistance with cooking/housework;Assistance with feeding;Direct supervision/assist for medications management;Direct supervision/assist for financial management;Assist for transportation;Help with stairs or ramp for entrance    Functional Status Assessment  Patient has had a recent decline in their functional status and/or demonstrates limited ability to make significant improvements in function in a reasonable and predictable amount of time  Equipment Recommendations  BSC/3in1    Recommendations for Other Services       Precautions / Restrictions Precautions Precautions: Fall Restrictions Weight Bearing Restrictions Per Provider Order: No      Mobility Bed Mobility Overal bed mobility:  Needs Assistance Bed Mobility: Supine to Sit, Sit to Supine     Supine to sit: Mod assist, HOB elevated Sit to supine: Mod assist        Transfers Overall transfer level: Needs assistance Equipment used: Rolling walker (2 wheels) Transfers: Sit to/from Stand Sit to Stand: +2 physical assistance, Min assist, From elevated surface           General transfer comment: hands placed on stabilized walker, assist to rise and steady      Balance Overall balance assessment: Needs assistance   Sitting balance-Leahy Scale: Good       Standing balance-Leahy Scale: Poor                             ADL either performed or assessed with clinical judgement   ADL Overall ADL's : Needs assistance/impaired Eating/Feeding: Minimal assistance;Sitting   Grooming: Moderate assistance;Sitting   Upper Body Bathing: Moderate assistance;Sitting   Lower Body Bathing: Total assistance;+2 for physical assistance;Sit to/from stand   Upper Body Dressing : Maximal assistance;Sitting   Lower Body Dressing: Total assistance;Sit to/from stand   Toilet Transfer: Minimal assistance;Ambulation;Rolling walker (2 wheels);+2 for safety/equipment           Functional mobility during ADLs: +2 for safety/equipment;Minimal assistance;Rolling walker (2 wheels)       Vision Baseline Vision/History: 1 Wears glasses Ability to See in Adequate Light: 0 Adequate Patient Visual Report: No change from baseline       Perception         Praxis         Pertinent Vitals/Pain Pain Assessment Pain Assessment: Faces Faces Pain Scale: Hurts whole lot  Pain Location: R shoulder, R knee Pain Descriptors / Indicators: Discomfort, Grimacing, Guarding, Moaning Pain Intervention(s): Monitored during session, Repositioned     Extremity/Trunk Assessment Upper Extremity Assessment Upper Extremity Assessment: Defer to OT evaluation RUE Deficits / Details: crepitus in shoulder, unable to FF or  tolerate PROM beyond 30 degrees, full elbow ROM, signficant arthritic deformities in hand with limited finger/thumb extension and ulnar drift RUE Coordination: decreased fine motor;decreased gross motor LUE Deficits / Details: crepitus in shoulder, pain free FF WFL, full elbow ROM, significant arthritic deformities in hand with limited finger/thumb extension and ulnar drift LUE Coordination: decreased fine motor;decreased gross motor   Lower Extremity Assessment Lower Extremity Assessment: Generalized weakness (bilat hip and knee pain due to advanced OA)   Cervical / Trunk Assessment Cervical / Trunk Assessment: Kyphotic;Other exceptions Cervical / Trunk Exceptions: weakness   Communication Communication Communication: Hearing impairment   Cognition Arousal: Alert Behavior During Therapy: Flat affect, Anxious Overall Cognitive Status: History of cognitive impairments - at baseline                                 General Comments: daughter assisting in answering some questions, pt does not like humor     General Comments  Pt got choked just before eval resulting in increased anxiety. RN administered Xanax  during session.    Exercises     Shoulder Instructions      Home Living Family/patient expects to be discharged to:: Private residence Living Arrangements: Alone Available Help at Discharge: Family;Available PRN/intermittently Type of Home: House Home Access: Ramped entrance     Home Layout: One level;Other (Comment) (ramp to den (intel), does not go to basement)     Bathroom Shower/Tub: Producer, Television/film/video: Handicapped height Bathroom Accessibility: Yes   Home Equipment: Rollator (4 wheels);Cane - single point;Other (comment) (lift chair)          Prior Functioning/Environment Prior Level of Function : Needs assist             Mobility Comments: uses rollator all the time ADLs Comments: modified independent with BADLs;  son grocery shops, assist with housekeeping except doing dishes, reheats things, per daughter, pt eats crackers, pop tarts and Boost, poor appetite        OT Problem List: Decreased strength;Decreased range of motion;Decreased activity tolerance;Impaired balance (sitting and/or standing);Decreased coordination;Decreased cognition;Decreased safety awareness;Impaired UE functional use;Pain      OT Treatment/Interventions: Self-care/ADL training;DME and/or AE instruction;Therapeutic activities;Patient/family education;Balance training;Therapeutic exercise    OT Goals(Current goals can be found in the care plan section) Acute Rehab OT Goals OT Goal Formulation: With patient Time For Goal Achievement: 08/05/23 Potential to Achieve Goals: Fair ADL Goals Pt Will Perform Eating: with set-up;sitting Pt Will Perform Grooming: with min assist;standing Pt Will Perform Upper Body Dressing: with min assist;sitting Pt Will Perform Lower Body Dressing: with mod assist;sit to/from stand Pt Will Transfer to Toilet: with supervision;ambulating;bedside commode Pt Will Perform Toileting - Clothing Manipulation and hygiene: with supervision;sit to/from stand Pt/caregiver will Perform Home Exercise Program: Increased ROM;Right Upper extremity;Independently (R shoulder AAROM within pain tolerance) Additional ADL Goal #1: Pt will complete bed mobility with CGA in preparation for ADLs.  OT Frequency: Min 1X/week    Co-evaluation PT/OT/SLP Co-Evaluation/Treatment: Yes Reason for Co-Treatment: For patient/therapist safety   OT goals addressed during session: ADL's and self-care      AM-PAC OT 6 Clicks Daily Activity  Outcome Measure Help from another person eating meals?: A Little Help from another person taking care of personal grooming?: A Lot Help from another person toileting, which includes using toliet, bedpan, or urinal?: Total Help from another person bathing (including washing, rinsing,  drying)?: A Lot Help from another person to put on and taking off regular upper body clothing?: A Lot Help from another person to put on and taking off regular lower body clothing?: Total 6 Click Score: 11   End of Session Equipment Utilized During Treatment: Gait belt;Rolling walker (2 wheels)  Activity Tolerance: Patient tolerated treatment well Patient left: in bed;with call bell/phone within reach;with bed alarm set;with family/visitor present  OT Visit Diagnosis: Unsteadiness on feet (R26.81);Other abnormalities of gait and mobility (R26.89);Pain;Muscle weakness (generalized) (M62.81);Other symptoms and signs involving cognitive function                Time: 8958-8887 OT Time Calculation (min): 31 min Charges:  OT General Charges $OT Visit: 1 Visit OT Evaluation $OT Eval Moderate Complexity: 1 Mod Mliss HERO, OTR/L Acute Rehabilitation Services Office: [phone]   Kennth Mliss Helling 07/22/2023, 1:17 PM

## 2023-07-22 NOTE — NC FL2 (Signed)
 [place]  MEDICAID [id number] LEVEL OF CARE FORM     IDENTIFICATION  Patient Name: Renee Adams Birthdate: 10-01-1933 Sex: female Admission Date (Current Location): 07/20/2023  [facility] and [place] Number:  Producer, Television/film/video and Address:  [facility], [address], [place], [place] [address]      Provider Number: [id number]  Attending Physician Name and Address:  Cindy Garnette POUR, MD  Relative Name and Phone Number:       Current Level of Care: Hospital Recommended Level of Care: Skilled Nursing Facility Prior Approval Number:    Date Approved/Denied:   PASRR Number: [id number]  Discharge Plan: SNF    Current Diagnoses: Patient Active Problem List   Diagnosis Date Noted   SIRS (systemic inflammatory response syndrome) (HCC) 07/20/2023   Generalized weakness 07/20/2023   Enteritis 04/02/2023   SBO (small bowel obstruction) (HCC) 04/02/2023   History of breast cancer 04/02/2023   Anxiety 04/02/2023   Degenerative scoliosis 09/03/2021   Anticoagulated 03/05/2017   Malignant neoplasm of overlapping sites of left breast in female, estrogen receptor positive (HCC) 10/11/2016   Atrial septal defect 01/05/2015   Hand weakness 05/27/2014   Mixed hyperlipidemia 01/01/2014   Atrial fibrillation (HCC) 01/01/2014   Encounter for therapeutic drug monitoring 08/06/2013   Acute, but ill-defined, cerebrovascular disease 04/24/2013   Fracture of femoral neck, right (HCC) 12/03/2012   Fall 11/26/2012   Fracture of femoral shaft, right, closed (HCC) 11/26/2012   UTI (urinary tract infection) 11/26/2012   Essential hypertension, benign 08/18/2012   Status post left hip replacement 04/18/2012   Osteomyelitis of left hip (HCC) 03/31/2012    Orientation RESPIRATION BLADDER Height & Weight     Self, Time, Situation, Place  Normal Continent Weight: 128 lb 4.9 oz (58.2 kg) Height:  5' 2 (157.5 cm)  BEHAVIORAL SYMPTOMS/MOOD NEUROLOGICAL BOWEL NUTRITION  STATUS      Continent Diet (see DC summary)  AMBULATORY STATUS COMMUNICATION OF NEEDS Skin   Extensive Assist Verbally Normal                       Personal Care Assistance Level of Assistance  Bathing, Feeding, Dressing Bathing Assistance: Maximum assistance Feeding assistance: Limited assistance Dressing Assistance: Maximum assistance     Functional Limitations Info  Sight, Hearing, Speech Sight Info: Adequate (wears glasses) Hearing Info: Impaired Speech Info: Adequate    SPECIAL CARE FACTORS FREQUENCY  PT (By licensed PT), OT (By licensed OT)     PT Frequency: 5x/week OT Frequency: 5x/week            Contractures Contractures Info: Not present    Additional Factors Info  Allergies, Code Status Code Status Info: FULL Allergies Info: Dilaudid  (Hydromorphone  Hcl)  Hydromorphone   Morphine   Morphine  And Codeine  Sulfamethoxazole  Sulfa Antibiotics  Keflex  (Cephalexin )  Levaquin  (Levofloxacin )           Current Medications (07/22/2023):  This is the current hospital active medication list Current Facility-Administered Medications  Medication Dose Route Frequency Provider Last Rate Last Admin   acetaminophen  (TYLENOL ) tablet 650 mg  650 mg Oral Q6H PRN Patel, Ekta V, MD       Or   acetaminophen  (TYLENOL ) suppository 650 mg  650 mg Rectal Q6H PRN Patel, Ekta V, MD       albuterol  (PROVENTIL ) (2.5 MG/3ML) 0.083% nebulizer solution 2.5 mg  2.5 mg Inhalation Q6H PRN Cindy Garnette POUR, MD       ALPRAZolam  (XANAX )  tablet 0.25 mg  0.25 mg Oral BID PRN Hall, Carole N, DO   0.25 mg at 07/22/23 1049   pantoprazole  (PROTONIX ) injection 40 mg  40 mg Intravenous Q24H Patel, Ekta V, MD   40 mg at 07/21/23 1616   rOPINIRole  (REQUIP ) tablet 1 mg  1 mg Oral QHS PRN Hall, Carole N, DO   1 mg at 07/21/23 2242   [START ON 07/23/2023] sertraline  (ZOLOFT ) tablet 100 mg  100 mg Oral QHS Cindy Garnette POUR, MD       sodium chloride  flush (NS) 0.9 % injection 3 mL  3 mL Intravenous Q12H Tobie Modest V, MD   3 mL at 07/21/23 2200   traMADol  (ULTRAM ) tablet 50 mg  50 mg Oral Daily PRN Hall, Carole N, DO   50 mg at 07/22/23 9182   warfarin (COUMADIN ) tablet 0.5 mg  0.5 mg Oral ONCE-1600 Cindy Garnette POUR, MD       Warfarin - Pharmacist Dosing Inpatient   Does not apply q1600 Oriet, Jonathan, [facility]         Discharge Medications: Please see discharge summary for a list of discharge medications.  Relevant Imaging Results:  Relevant Lab Results:   Additional Information SSN: [id number]  Kristoph Sattler A Aila Terra, LCSWA

## 2023-07-22 NOTE — Progress Notes (Signed)
 PT Cancellation Note  Patient Details Name: Renee Adams MRN: [id number] DOB: 1934-03-28   Cancelled Treatment:    Reason Eval/Treat Not Completed: Patient at procedure or test/unavailable. Pt OTF. PT to re-attempt as time allows.   Erven Sari Shaker 07/22/2023, 9:29 AM

## 2023-07-22 NOTE — Evaluation (Signed)
 Physical Therapy Evaluation Patient Details Name: IMANNI BURDINE MRN: [id number] DOB: 05-26-1934 Today's Date: 07/22/2023  History of Present Illness  Pt is an 89 y.o. female admitted with generalized weakness. In ED, she reported having no strength in her L leg but denied LUE weakness. 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 No acute infarct. Pt also with c/o extensive R shoulder pain. Xray revealed OA, no fractue. PMH:  a fib, anxiety, HTN, ASD, hx breast ca   Clinical Impression  Pt admitted with above diagnosis. PTA pt lived alone, mod I household mobility with rollator. Pt currently with functional limitations due to the deficits listed below (see PT Problem List). On eval, pt required mod assist bed mobility, +2 min assist transfers, and min assist +2 safety amb 20' with RW. Gait distance limited by pain and weakness. Pt with multi sites of pain due to advanced arthritis (bilat hands, R shld, bilat hip, bilat knees). Hand deformities making it difficult to grip RW. Pt demo fair sitting balance and poor standing balance. Pt will benefit from acute skilled PT to increase their independence and safety with mobility to allow discharge. Upon d/c, pt would benefit from further therapy in inpatient setting, <3 hours/day.         If plan is discharge home, recommend the following: A lot of help with walking and/or transfers;A lot of help with bathing/dressing/bathroom;Assistance with cooking/housework   Can travel by private vehicle   No    Equipment Recommendations Wheelchair (measurements PT)  Recommendations for Other Services       Functional Status Assessment Patient has had a recent decline in their functional status and demonstrates the ability to make significant improvements in function in a reasonable and predictable amount of time.     Precautions / Restrictions Precautions Precautions: Fall Restrictions Weight Bearing  Restrictions Per Provider Order: No      Mobility  Bed Mobility Overal bed mobility: Needs Assistance Bed Mobility: Supine to Sit, Sit to Supine     Supine to sit: Mod assist, HOB elevated Sit to supine: Mod assist, HOB elevated   General bed mobility comments: increased time, assist with BLE and trunk    Transfers Overall transfer level: Needs assistance Equipment used: Rolling walker (2 wheels) Transfers: Sit to/from Stand Sit to Stand: From elevated surface, +2 physical assistance, Min assist           General transfer comment: used bed pad under hips to power up due to multi sites of arthritic pain. Increased time to stabilize balance    Ambulation/Gait Ambulation/Gait assistance: +2 safety/equipment, Min assist Gait Distance (Feet): 20 Feet Assistive device: Rolling walker (2 wheels) Gait Pattern/deviations: Step-through pattern, Antalgic, Decreased stride length Gait velocity: decreased Gait velocity interpretation: <1.8 ft/sec, indicate of risk for recurrent falls   General Gait Details: very slow, tenuous gait. No LOB noted. Distance limited by pain and weakness.  Stairs            Wheelchair Mobility     Tilt Bed    Modified Rankin (Stroke Patients Only)       Balance Overall balance assessment: Needs assistance Sitting-balance support: Feet supported Sitting balance-Leahy Scale: Fair     Standing balance support: Bilateral upper extremity supported, Reliant on assistive device for balance, During functional activity Standing balance-Leahy Scale: Poor  Pertinent Vitals/Pain Pain Assessment Pain Assessment: Faces Faces Pain Scale: Hurts whole lot Pain Location: R shoulder, R knee Pain Descriptors / Indicators: Discomfort, Grimacing, Guarding, Moaning Pain Intervention(s): Monitored during session, Repositioned, Limited activity within patient's tolerance    Home Living Family/patient expects  to be discharged to:: Private residence Living Arrangements: Alone Available Help at Discharge: Family;Available PRN/intermittently Type of Home: House Home Access: Ramped entrance       Home Layout: One level;Other (Comment) (ramp to den (enclosed carport), does not go to basement) Home Equipment: Rollator (4 wheels);Cane - single point;Other (comment) (lift chair)      Prior Function Prior Level of Function : Needs assist             Mobility Comments: uses rollator all the time ADLs Comments: modified independent with BADLs; son grocery shops, assist with housekeeping except doing dishes, reheats things, per daughter, pt eats crackers, pop tarts and Boost, poor appetite     Extremity/Trunk Assessment   Upper Extremity Assessment Upper Extremity Assessment: Defer to OT evaluation RUE Deficits / Details: crepitus in shoulder, unable to FF or tolerate PROM beyond 30 degrees, full elbow ROM, signficant arthritic deformities in hand with limited finger/thumb extension and ulnar drift RUE Coordination: decreased fine motor;decreased gross motor LUE Deficits / Details: crepitus in shoulder, pain free FF WFL, full elbow ROM, significant arthritic deformities in hand with limited finger/thumb extension and ulnar drift LUE Coordination: decreased fine motor;decreased gross motor    Lower Extremity Assessment Lower Extremity Assessment: Generalized weakness (bilat hip and knee pain due to advanced OA)    Cervical / Trunk Assessment Cervical / Trunk Assessment: Kyphotic;Other exceptions Cervical / Trunk Exceptions: weakness  Communication   Communication Communication: Hearing impairment  Cognition Arousal: Alert Behavior During Therapy: Flat affect, Anxious Overall Cognitive Status: History of cognitive impairments - at baseline                                 General Comments: daughter assisting in answering some questions, pt does not like humor         General Comments General comments (skin integrity, edema, etc.): Pt got choked just before eval resulting in increased anxiety. RN administered Xanax  during session.    Exercises     Assessment/Plan    PT Assessment Patient needs continued PT services  PT Problem List Decreased strength;Decreased balance;Pain;Decreased mobility;Decreased activity tolerance       PT Treatment Interventions Functional mobility training;Balance training;Patient/family education;Therapeutic activities;Gait training;Therapeutic exercise    PT Goals (Current goals can be found in the Care Plan section)  Acute Rehab PT Goals Patient Stated Goal: home PT Goal Formulation: With patient/family Time For Goal Achievement: 08/05/23 Potential to Achieve Goals: Good    Frequency Min 1X/week     Co-evaluation   Reason for Co-Treatment: For patient/therapist safety   OT goals addressed during session: ADL's and self-care       AM-PAC PT 6 Clicks Mobility  Outcome Measure Help needed turning from your back to your side while in a flat bed without using bedrails?: A Lot Help needed moving from lying on your back to sitting on the side of a flat bed without using bedrails?: A Lot Help needed moving to and from a bed to a chair (including a wheelchair)?: A Lot Help needed standing up from a chair using your arms (e.g., wheelchair or bedside chair)?: A Lot Help needed to walk in  hospital room?: A Little Help needed climbing 3-5 steps with a railing? : Total 6 Click Score: 12    End of Session Equipment Utilized During Treatment: Gait belt Activity Tolerance: Patient limited by pain Patient left: in bed;with call bell/phone within reach;with family/visitor present Nurse Communication: Mobility status PT Visit Diagnosis: Other abnormalities of gait and mobility (R26.89);Pain    Time: 1040-1110 PT Time Calculation (min) (ACUTE ONLY): 30 min   Charges:   PT Evaluation $PT Eval Moderate Complexity: 1  Mod   PT General Charges $$ ACUTE PT VISIT: 1 Visit         Sari MATSU., PT  Office # [phone]   Erven Sari Shaker 07/22/2023, 12:26 PM

## 2023-07-22 NOTE — TOC Initial Note (Addendum)
 Transition of Care [facility]) - Initial/Assessment Note    Patient Details  Name: Renee Adams MRN: [id number] Date of Birth: 1934-04-05  Transition of Care [facility]) CM/SW Contact:    Renee Adams, Renee Adams Phone Number: 07/22/2023, 3:33 PM  Clinical Narrative:                  CSW met with pt and pt's daughter Renee Adams at discharge. She stated that pt was agreeable to SNF. Pt preference for [facility], as pt has been there in the past. CSW completed SNF workup, bed offers pending. Pt is in observation, eligible for [facility] waiver, does not need 3 night inpatient stay to qualify for [facility] due to waiver or other [facility] eligible facilities.   TOC will continue to follow.   Expected Discharge Plan: Skilled Nursing Facility Barriers to Discharge: SNF Pending bed offer, Continued Medical Work up   Patient Goals and CMS Choice            Expected Discharge Plan and Services In-house Referral: Clinical Social Work     Living arrangements for the past 2 months: Single Family Home                                      Prior Living Arrangements/Services Living arrangements for the past 2 months: Single Family Home Lives with:: Self          Need for Family Participation in Patient Care: Yes (Comment) Care giver support system in place?: Yes (comment) (pt's son and daughter)      Activities of Daily Living   ADL Screening (condition at time of admission) Independently performs ADLs?: Yes (appropriate for developmental age) Is the patient deaf or have difficulty hearing?: Yes Does the patient have difficulty seeing, even when wearing glasses/contacts?: No Does the patient have difficulty concentrating, remembering, or making decisions?: Yes  Permission Sought/Granted                  Emotional Assessment Appearance:: Appears stated age Attitude/Demeanor/Rapport: Gracious Affect (typically observed): Appropriate Orientation: : Oriented to Self, Oriented to Place, Oriented  to Situation Alcohol / Substance Use: Not Applicable Psych Involvement: No (comment)  Admission diagnosis:  Generalized weakness [R53.1] Supratherapeutic INR [R79.1] Patient Active Problem List   Diagnosis Date Noted   SIRS (systemic inflammatory response syndrome) (HCC) 07/20/2023   Generalized weakness 07/20/2023   Enteritis 04/02/2023   SBO (small bowel obstruction) (HCC) 04/02/2023   History of breast cancer 04/02/2023   Anxiety 04/02/2023   Degenerative scoliosis 09/03/2021   Anticoagulated 03/05/2017   Malignant neoplasm of overlapping sites of left breast in female, estrogen receptor positive (HCC) 10/11/2016   Atrial septal defect 01/05/2015   Hand weakness 05/27/2014   Mixed hyperlipidemia 01/01/2014   Atrial fibrillation (HCC) 01/01/2014   Encounter for therapeutic drug monitoring 08/06/2013   Acute, but ill-defined, cerebrovascular disease 04/24/2013   Fracture of femoral neck, right (HCC) 12/03/2012   Fall 11/26/2012   Fracture of femoral shaft, right, closed (HCC) 11/26/2012   UTI (urinary tract infection) 11/26/2012   Essential hypertension, benign 08/18/2012   Status post left hip replacement 04/18/2012   Osteomyelitis of left hip (HCC) 03/31/2012   PCP:  Renee Cole, MD Pharmacy:   [facility] DELIVERY - [address], [place] - [address] [place] [address] Phone: [phone] Fax: [phone]  [facility] -  [place], [place] - [address] [place] [address] Phone: [phone] Fax: [phone]     Social Drivers of Health (SDOH) Social History: SDOH Screenings   Food Insecurity: No Food Insecurity (07/21/2023)  Housing: Low Risk  (07/21/2023)  Transportation Needs: No Transportation Needs (07/21/2023)  Utilities: Not At Risk (07/21/2023)  Social Connections: Socially Isolated (07/21/2023)  Tobacco Use: Low Risk  (07/20/2023)   SDOH Interventions:     Readmission Risk  Interventions     No data to display

## 2023-07-22 NOTE — Plan of Care (Signed)

## 2023-07-22 NOTE — Progress Notes (Signed)
 PHARMACY - ANTICOAGULATION CONSULT NOTE  Pharmacy Consult for warfarin Indication: atrial fibrillation  Allergies  Allergen Reactions   Dilaudid  [Hydromorphone  Hcl] Shortness Of Breath    Tolerates tramadol    Hydromorphone  Other (See Comments)    UNKNOWN TO PATIENT   Morphine  Other (See Comments)    UNKNOWN TO PATIENT   Morphine  And Codeine Shortness Of Breath    Tolerates tramadol    Sulfamethoxazole Other (See Comments)    UNKNOWN TO PATIENT   Sulfa Antibiotics Other (See Comments)    Made me drunk;  wobbly   Keflex  [Cephalexin ] Nausea Only   Levaquin  [Levofloxacin ] Hives and Rash    Patient Measurements: Height: 5' 2 (157.5 cm) Weight: 58.2 kg (128 lb 4.9 oz) IBW/kg (Calculated) : 50.1   Vital Signs: Temp: 98 F (36.7 C) (01/13 0537) Temp Source: Oral (01/13 0537) BP: 117/58 (01/13 0537) Pulse Rate: 78 (01/13 0537)  Labs: Recent Labs    07/20/23 1111 07/20/23 1248 07/20/23 1410 07/21/23 0440 07/22/23 0539  HGB 12.3  --   --  11.0*  --   HCT 38.2  --   --  35.2*  --   PLT 402*  --   --  370  --   LABPROT  --   --  34.7* 40.8* 33.5*  INR  --   --  3.4* 4.2* 3.3*  CREATININE 0.78  --   --  0.86  --   CKTOTAL 20*  --   --   --   --   TROPONINIHS 3 3  --   --   --     Estimated Creatinine Clearance: 36.5 mL/min (by C-G formula based on SCr of 0.86 mg/dL).   Medical History: Past Medical History:  Diagnosis Date   ASD (atrial septal defect)    Cancer (HCC)    breast, left   Carpal tunnel syndrome, bilateral    Depression    Gait disorder    GERD (gastroesophageal reflux disease)    Glaucoma    Glaucoma    History of stroke    Hypertension    IBS (irritable bowel syndrome)    Personal history of radiation therapy 1996   S/P bunionectomy    bilateral    Assessment: 34 YOF presenting with weakness, hx of afib on warfarin PTA with last dose taken 1/10, INR on admission is supratherapeutic at 3.4  PTA dosing:  2mg  daily except 3mg  on  Wed  INR this AM trending down to 3.3. RN reports patient not eating much and diet changed to soft diet.  No bleeding reported.  Goal of Therapy:  INR 2-3 Monitor platelets by anticoagulation protocol: Yes   Plan:  Give warfarin 0.5 mg po x 1 Monitor daily INR, CBC, clinical course, s/sx of bleed, PO intake/diet, Drug-Drug Interactions    Thank you for allowing pharmacy to be a part of this patient's care.   Bascom JAYSON Louder, PharmD 07/22/2023 12:35 PM  **Pharmacist phone directory can be found on [email or web address] listed under [facility]**

## 2023-07-22 NOTE — Progress Notes (Signed)
  Progress Note   Patient: Renee Adams FMW:[id number] DOB: [birth year] DOA: 07/20/2023     0 DOS: the patient was seen and examined on 07/22/2023   Brief hospital course: 89yo with hx afib on chronic anticoagulation, anxiety , HTN, ASD, hx breast ca presented to ED with increased generalized weakness.  Pt initially reported progressive weakness on trying to get to and from mailbox. On the morning of presentation, pt recalls being unable to get out of bed, states having no strength in her L leg. Denied acute UE weakness. Pt did report on-going issues regarding R shoulder pains  Assessment and Plan: Generalized weakness -Unclear etiology -CT head neg for acute process. 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 No acute infarct -Ordered and reviewed lumbar xray. Rightward lumbar scoliosis with diffuse DJD noted -f/u on PT/OT recs. SNF was recommended. Consult TOC  Arthritis -extensive arthritic hand joint deformities noted on exam -OT was consulted  Afib -Currently rate controlled -Continue coumadin  per pharmacy dosing, per below -no meds for rate control noted on mar  Chronic anticoagulation -pt noted to have supertherapeutic INR at 4.2 on presentation -Pharmacy consulted for dosing  UTI and sepsis ruled out -Pt afebrile, no leukocytosis. Not septic -small Leuks on UA -Pt denies any suprapubic pain or any dysuria, is aysmptomatic, thus will not treat for UTI -Will discontinue further abx at this time  HTN -BP stable at this time  Subjective: Without complaints at this time  Physical Exam: Vitals:   07/22/23 0009 07/22/23 0500 07/22/23 0537 07/22/23 0600  BP: 119/66  (!) 117/58   Pulse: 80  78   Resp: 17  18   Temp: 98.3 F (36.8 C)  98 F (36.7 C)   TempSrc: Oral  Oral   SpO2: (!) 88%  (!) 89% 93%  Weight:  58.2 kg    Height:       General exam: Conversant, in no acute distress Respiratory system: normal chest rise,  clear, no audible wheezing Cardiovascular system: regular rhythm, s1-s2 Gastrointestinal system: Nondistended, nontender, pos BS Central nervous system: No seizures, no tremors Extremities: No cyanosis, no joint deformities Skin: No rashes, no pallor Psychiatry: Affect normal // no auditory hallucinations   Data Reviewed:  There are no new results to review at this time.  Family Communication: Pt in room, family not at bedside  Disposition: Status is: Observation The patient remains OBS appropriate and will d/c before 2 midnights.  Planned Discharge Destination: Skilled nursing facility    Author: Garnette Pelt, MD 07/22/2023 1:13 PM  For on call review [email or web address].

## 2023-07-23 DIAGNOSIS — R531 Weakness: Secondary | ICD-10-CM | POA: Diagnosis not present

## 2023-07-23 DIAGNOSIS — R791 Abnormal coagulation profile: Secondary | ICD-10-CM | POA: Diagnosis not present

## 2023-07-23 LAB — PROTIME-INR
INR: 3.2 — ABNORMAL HIGH (ref 0.8–1.2)
Prothrombin Time: 33.3 s — ABNORMAL HIGH (ref 11.4–15.2)

## 2023-07-23 MED ORDER — TRAMADOL HCL 50 MG PO TABS
50.0000 mg | ORAL_TABLET | Freq: Two times a day (BID) | ORAL | Status: DC | PRN
  Administered 2023-07-23 – 2023-07-24 (×2): 50 mg via ORAL
  Filled 2023-07-23 (×2): qty 1

## 2023-07-23 MED ORDER — WARFARIN 0.5 MG HALF TABLET
0.5000 mg | ORAL_TABLET | Freq: Once | ORAL | Status: AC
  Administered 2023-07-23: 0.5 mg via ORAL
  Filled 2023-07-23: qty 1

## 2023-07-23 NOTE — Progress Notes (Signed)
 This nurse rounded on patient at approx 0800. This nurse received call that patient needed pain medication at 0957. When nurse entered room, daughter was upset that patient is in pain and nothing is being done. Patient is sleeping interacting minimally with this nurse. Patient's orders reflect Tramadol  to be given once daily and alert by [facility] that medication is being given too soon flagged this nurse. This nurse notified Dr. Cindy that daughter is requesting new order. This nurse only received one notification that the patient wanted pain and anxiety meds. This nurse provided patient with phone number on whiteboard at 0730 with bedside shift report. Patient declined any needs at that time.

## 2023-07-23 NOTE — Progress Notes (Signed)
 PHARMACY - ANTICOAGULATION CONSULT NOTE  Pharmacy Consult for Warfarin Indication: atrial fibrillation  Allergies  Allergen Reactions   Dilaudid  [Hydromorphone  Hcl] Shortness Of Breath    Tolerates tramadol    Hydromorphone  Other (See Comments)    UNKNOWN TO PATIENT   Morphine  Other (See Comments)    UNKNOWN TO PATIENT   Morphine  And Codeine Shortness Of Breath    Tolerates tramadol    Sulfamethoxazole Other (See Comments)    UNKNOWN TO PATIENT   Sulfa Antibiotics Other (See Comments)    Made me drunk;  wobbly   Keflex  [Cephalexin ] Nausea Only   Levaquin  [Levofloxacin ] Hives and Rash    Patient Measurements: Height: 5' 2 (157.5 cm) Weight: 58.2 kg (128 lb 4.9 oz) IBW/kg (Calculated) : 50.1  Vital Signs: BP: 122/66 (01/14 0824) Pulse Rate: 83 (01/14 0824)  Labs: Recent Labs    07/20/23 1248 07/20/23 1410 07/21/23 0440 07/22/23 0539 07/23/23 0537  HGB  --   --  11.0*  --   --   HCT  --   --  35.2*  --   --   PLT  --   --  370  --   --   LABPROT  --    < > 40.8* 33.5* 33.3*  INR  --    < > 4.2* 3.3* 3.2*  CREATININE  --   --  0.86  --   --   TROPONINIHS 3  --   --   --   --    < > = values in this interval not displayed.    Estimated Creatinine Clearance: 36.5 mL/min (by C-G formula based on SCr of 0.86 mg/dL).  Assessment: 15 YOF presenting with weakness, hx of afib on warfarin PTA with last dose taken 07/19/23 (2 mg). INR on admission 07/20/23 was supratherapeutic at 3.4.  Warfarin held 1/11 with INR 3.4 and held again 1/12 with INR up to 4.2. INR down to 3.2 on 1/13 and low dose of 0.5 mg given.  INR 3.3 today. Little PO intake.  PTA warfarin regimen: 2 mg daily except 3 mg on Wednesdays.  Goal of Therapy:  INR 2-3 Monitor platelets by anticoagulation protocol: Yes   Plan:  Repeat warfarin 0.5 mg x 1 today. Daily PT/INR.  Intermittent CBC. Monitor for signs/symptoms of bleeding.  Genaro Zebedee Calin, RPh 07/23/2023,12:22 PM

## 2023-07-23 NOTE — Plan of Care (Signed)

## 2023-07-23 NOTE — Progress Notes (Signed)
  Progress Note   Patient: Renee Adams FMW:[id number] DOB: 1933-10-15 DOA: 07/20/2023     1 DOS: the patient was seen and examined on 07/23/2023   Brief hospital course: 89yo with hx afib on chronic anticoagulation, anxiety , HTN, ASD, hx breast ca presented to ED with increased generalized weakness.  Pt initially reported progressive weakness on trying to get to and from mailbox. On the morning of presentation, pt recalls being unable to get out of bed, states having no strength in her L leg. Denied acute UE weakness. Pt did report on-going issues regarding R shoulder pains  Assessment and Plan: Generalized weakness -Unclear etiology -CT head neg for acute process. 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 No acute infarct -Ordered and reviewed lumbar xray. Rightward lumbar scoliosis with diffuse DJD noted -PT/OT recs for SNF, TOC following  Arthritis -extensive arthritic hand joint deformities noted on exam -OT following -Cont home dose and frequency of Q12h ultram  for now. Of note, multiple allergies are listed for various opiates  Afib -Currently rate controlled -Continue coumadin  per pharmacy dosing, per below -no meds for rate control noted on mar  Chronic anticoagulation -Pharmacy consulted for dosing  UTI and sepsis ruled out -Pt afebrile, no leukocytosis. Not septic -small Leuks on UA -Pt denies any suprapubic pain or any dysuria, is aysmptomatic, thus will not treat for UTI -Discontinue further abx at this time. Cont to monitor  HTN -BP stable at this time  Subjective: Continues to feel generally weak. Complaining of chronic back and joint pains, asking for frequency of ultram  to be increased  Physical Exam: Vitals:   07/22/23 0600 07/22/23 2123 07/23/23 0019 07/23/23 0824  BP:  116/69 129/66 122/66  Pulse:  86 87 83  Resp:  17 18   Temp:  98.7 F (37.1 C) 98.4 F (36.9 C)   TempSrc:  Oral Oral   SpO2: 93% 94%  94% 95%  Weight:      Height:       General exam: Conversant, in no acute distress Respiratory system: normal chest rise, clear, no audible wheezing Cardiovascular system: regular rhythm, s1-s2 Gastrointestinal system: Nondistended, nontender, pos BS Central nervous system: No seizures, no tremors Extremities: No cyanosis, no joint deformities Skin: No rashes, no pallor Psychiatry: Affect normal // no auditory hallucinations   Data Reviewed:  There are no new results to review at this time.  Family Communication: Pt in room, family at bedside  Disposition: Status is: Inpatient The patient will require care spanning > 2 midnights and should be moved to inpatient because: severity of illness  Planned Discharge Destination:  Awaiting PT/OT    Author: Garnette Pelt, MD 07/23/2023 1:26 PM  For on call review [email or web address].

## 2023-07-23 NOTE — Progress Notes (Signed)
 Report received from bedside RN regarding the patient having difficulty swallowing her pills with concern for dysphagia.    Per bedside RN during shift change she was told by the day team that the patient was having difficulty eating and drinking.  Diet changed from dysphagia 3 diet to n.p.o.  Speech therapist consulted for an official swallow evaluation.  Aspiration precautions are in place.  No witnessed aspiration per bedside RN.   No charge note.

## 2023-07-23 NOTE — TOC Progression Note (Signed)
 Transition of Care [facility]) - Progression Note    Patient Details  Name: Renee Adams MRN: [id number] Date of Birth: 1933/07/29  Transition [facility]) CM/SW Contact  Jeane Cashatt A Piotr Christopher, [place] Phone Number: 07/23/2023, 5:09 PM  Clinical Narrative:     CSW met with pt and pt's daughter at bedside. They chose [facility] for placement. CSW to follow up regarding bed availability. Can DC when stable and bed is available, no insurance authorization needed for DC.   TOC will continue to follow.   Expected Discharge Plan: Skilled Nursing Facility Barriers to Discharge: SNF Pending bed offer, Continued Medical Work up  Expected Discharge Plan and Services In-house Referral: Clinical Social Work     Living arrangements for the past 2 months: Single Family Home                                       Social Determinants of Health (SDOH) Interventions SDOH Screenings   Food Insecurity: No Food Insecurity (07/21/2023)  Housing: Low Risk  (07/21/2023)  Transportation Needs: No Transportation Needs (07/21/2023)  Utilities: Not At Risk (07/21/2023)  Social Connections: Socially Isolated (07/21/2023)  Tobacco Use: Low Risk  (07/20/2023)    Readmission Risk Interventions     No data to display

## 2023-07-23 NOTE — Evaluation (Signed)
 Clinical/Bedside Swallow Evaluation Patient Details  Name: Renee Adams MRN: [id number] Date of Birth: 01/26/1934  Today's Date: 07/23/2023 Time: SLP Start Time (ACUTE ONLY): 9170 SLP Stop Time (ACUTE ONLY): 0849 SLP Time Calculation (min) (ACUTE ONLY): 20 min  Past Medical History:  Past Medical History:  Diagnosis Date   ASD (atrial septal defect)    Cancer (HCC)    breast, left   Carpal tunnel syndrome, bilateral    Depression    Gait disorder    GERD (gastroesophageal reflux disease)    Glaucoma    Glaucoma    History of stroke    Hypertension    IBS (irritable bowel syndrome)    Personal history of radiation therapy 1996   S/P bunionectomy    bilateral   Past Surgical History:  Past Surgical History:  Procedure Laterality Date   ABDOMINAL HYSTERECTOMY     bladder resuspension     BREAST BIOPSY Left    BREAST BIOPSY Right    BREAST LUMPECTOMY Left 1996   CATARACT EXTRACTION Bilateral    INCISION AND DRAINAGE HIP  03/31/2012   Procedure: IRRIGATION AND DEBRIDEMENT HIP WITH POLY EXCHANGE;  Surgeon: Garnette JONETTA Raman, MD;  Location: [facility] OR;  Service: Orthopedics;  Laterality: Left;   ORIF FEMUR FRACTURE Right 11/30/2012   Procedure: OPEN REDUCTION INTERNAL FIXATION (ORIF) DISTAL FEMUR FRACTURE;  Surgeon: Marcey Raman, MD;  Location: [facility] OR;  Service: Orthopedics;  Laterality: Right;   TOTAL HIP ARTHROPLASTY Bilateral    TOTAL KNEE ARTHROPLASTY Bilateral    HPI:  Pt is an 89 y.o. female admitted with generalized weakness. In ED, she reported having no strength in her L leg but denied LUE weakness. 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 No acute infarct. Pt also with c/o extensive R shoulder pain. Xray revealed OA, no fracture. PMH:  a fib, anxiety, HTN, ASD, hx breast    Assessment / Plan / Recommendation  Clinical Impression  Pt and daughter reports pt get strangled frequently sometimes when in a restaurant group setting  when she is talking during meals. Yesterday pt coughed with thin via straw and daughter stated she was taking large consecutive sip straw sips. She is missing majority of dentition and was on a Dys 3 texture yesterday before becoming NPO. Oromotor abilities and cough are within normal limits. Throughout assessment there was only one delayed throat clear with thin out of multiple trials. Given additional time she was able to masticate small bites solid texture with mild delays and liquid wash due to mild xerostomia and cleared oral cavity. Pt has pain in knees and shoulder and needs encouragement to sit upright. Pt and daughter deny recent reflux. If pt adheres to behavioral strategies such as small sips, upright posture, no tallking during meals, feel she should be safe to return to Dys 3/thin liquids. Pt's daughter denied difficulties taking pills with water. Diet was placed and ST will follow up once for continued education while in hospital. SLP Visit Diagnosis: Dysphagia, unspecified (R13.10)    Aspiration Risk  Mild aspiration risk    Diet Recommendation Dysphagia 3 (Mech soft);Thin liquid    Liquid Administration via: Straw;Cup Medication Administration: Whole meds with liquid Supervision: Patient able to self feed Compensations: Slow rate;Small sips/bites Postural Changes: Seated upright at 90 degrees    Other  Recommendations Oral Care Recommendations: Oral care BID    Recommendations for follow up therapy are one component of a multi-disciplinary  discharge planning process, led by the attending physician.  Recommendations may be updated based on patient status, additional functional criteria and insurance authorization.  Follow up Recommendations No SLP follow up      Assistance Recommended at Discharge    Functional Status Assessment Patient has had a recent decline in their functional status and demonstrates the ability to make significant improvements in function in a reasonable  and predictable amount of time.  Frequency and Duration min 1 x/week  1 week       Prognosis Prognosis for improved oropharyngeal function: Good      Swallow Study   General Date of Onset: 07/23/23 HPI: Pt is an 89 y.o. female admitted with generalized weakness. In ED, she reported having no strength in her L leg but denied LUE weakness. 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 No acute infarct. Pt also with c/o extensive R shoulder pain. Xray revealed OA, no fracture. PMH:  a fib, anxiety, HTN, ASD, hx breast Type of Study: Bedside Swallow Evaluation Previous Swallow Assessment:  (none) Diet Prior to this Study: NPO Temperature Spikes Noted: No Respiratory Status: Room air History of Recent Intubation: No Behavior/Cognition: Cooperative;Pleasant mood;Alert [facility]) Oral Cavity Assessment: Dry Oral Care Completed by SLP: No Oral Cavity - Dentition: Poor condition;Missing dentition Vision: Functional for self-feeding Self-Feeding Abilities: Needs assist (due to arthritis) Patient Positioning: Upright in bed Baseline Vocal Quality: Normal Volitional Cough: Strong Volitional Swallow: Able to elicit    Oral/Motor/Sensory Function Overall Oral Motor/Sensory Function: Within functional limits   Ice Chips Ice chips: Not tested   Thin Liquid Thin Liquid: Impaired Presentation: Straw Oral Phase Impairments:  (none) Oral Phase Functional Implications:  (none) Pharyngeal  Phase Impairments: Throat Clearing - Delayed    Nectar Thick Nectar Thick Liquid: Not tested   Honey Thick Honey Thick Liquid: Not tested   Puree Puree: Within functional limits   Solid     Solid: Impaired Oral Phase Functional Implications: Prolonged oral transit      Dustin Olam Bull 07/23/2023,9:07 AM

## 2023-07-24 DIAGNOSIS — R531 Weakness: Secondary | ICD-10-CM | POA: Diagnosis not present

## 2023-07-24 DIAGNOSIS — Q211 Atrial septal defect, unspecified: Secondary | ICD-10-CM | POA: Diagnosis not present

## 2023-07-24 DIAGNOSIS — Z7401 Bed confinement status: Secondary | ICD-10-CM | POA: Diagnosis not present

## 2023-07-24 DIAGNOSIS — M6281 Muscle weakness (generalized): Secondary | ICD-10-CM | POA: Diagnosis not present

## 2023-07-24 DIAGNOSIS — H918X2 Other specified hearing loss, left ear: Secondary | ICD-10-CM | POA: Diagnosis not present

## 2023-07-24 DIAGNOSIS — H9190 Unspecified hearing loss, unspecified ear: Secondary | ICD-10-CM | POA: Diagnosis not present

## 2023-07-24 DIAGNOSIS — I480 Paroxysmal atrial fibrillation: Secondary | ICD-10-CM | POA: Diagnosis not present

## 2023-07-24 DIAGNOSIS — Z5181 Encounter for therapeutic drug level monitoring: Secondary | ICD-10-CM | POA: Diagnosis not present

## 2023-07-24 DIAGNOSIS — G2581 Restless legs syndrome: Secondary | ICD-10-CM | POA: Diagnosis not present

## 2023-07-24 DIAGNOSIS — R1310 Dysphagia, unspecified: Secondary | ICD-10-CM | POA: Diagnosis not present

## 2023-07-24 DIAGNOSIS — R0602 Shortness of breath: Secondary | ICD-10-CM | POA: Diagnosis not present

## 2023-07-24 DIAGNOSIS — H918X1 Other specified hearing loss, right ear: Secondary | ICD-10-CM | POA: Diagnosis not present

## 2023-07-24 DIAGNOSIS — F419 Anxiety disorder, unspecified: Secondary | ICD-10-CM | POA: Diagnosis not present

## 2023-07-24 DIAGNOSIS — M1990 Unspecified osteoarthritis, unspecified site: Secondary | ICD-10-CM | POA: Diagnosis not present

## 2023-07-24 DIAGNOSIS — T7840XD Allergy, unspecified, subsequent encounter: Secondary | ICD-10-CM | POA: Diagnosis not present

## 2023-07-24 DIAGNOSIS — F411 Generalized anxiety disorder: Secondary | ICD-10-CM | POA: Diagnosis not present

## 2023-07-24 DIAGNOSIS — R6510 Systemic inflammatory response syndrome (SIRS) of non-infectious origin without acute organ dysfunction: Secondary | ICD-10-CM | POA: Diagnosis not present

## 2023-07-24 DIAGNOSIS — Z853 Personal history of malignant neoplasm of breast: Secondary | ICD-10-CM | POA: Diagnosis not present

## 2023-07-24 DIAGNOSIS — I482 Chronic atrial fibrillation, unspecified: Secondary | ICD-10-CM | POA: Diagnosis not present

## 2023-07-24 DIAGNOSIS — M25511 Pain in right shoulder: Secondary | ICD-10-CM | POA: Diagnosis not present

## 2023-07-24 DIAGNOSIS — F330 Major depressive disorder, recurrent, mild: Secondary | ICD-10-CM | POA: Diagnosis not present

## 2023-07-24 DIAGNOSIS — R791 Abnormal coagulation profile: Secondary | ICD-10-CM | POA: Diagnosis not present

## 2023-07-24 DIAGNOSIS — T45515A Adverse effect of anticoagulants, initial encounter: Secondary | ICD-10-CM | POA: Diagnosis not present

## 2023-07-24 DIAGNOSIS — F5102 Adjustment insomnia: Secondary | ICD-10-CM | POA: Diagnosis not present

## 2023-07-24 DIAGNOSIS — Z7901 Long term (current) use of anticoagulants: Secondary | ICD-10-CM | POA: Diagnosis not present

## 2023-07-24 DIAGNOSIS — I639 Cerebral infarction, unspecified: Secondary | ICD-10-CM | POA: Diagnosis not present

## 2023-07-24 DIAGNOSIS — I10 Essential (primary) hypertension: Secondary | ICD-10-CM | POA: Diagnosis not present

## 2023-07-24 DIAGNOSIS — H9071 Mixed conductive and sensorineural hearing loss, unilateral, right ear, with unrestricted hearing on the contralateral side: Secondary | ICD-10-CM | POA: Diagnosis not present

## 2023-07-24 DIAGNOSIS — F32A Depression, unspecified: Secondary | ICD-10-CM | POA: Diagnosis not present

## 2023-07-24 LAB — CBC
HCT: 33.8 % — ABNORMAL LOW (ref 36.0–46.0)
Hemoglobin: 11 g/dL — ABNORMAL LOW (ref 12.0–15.0)
MCH: 27.4 pg (ref 26.0–34.0)
MCHC: 32.5 g/dL (ref 30.0–36.0)
MCV: 84.1 fL (ref 80.0–100.0)
Platelets: 343 10*3/uL (ref 150–400)
RBC: 4.02 MIL/uL (ref 3.87–5.11)
RDW: 13.8 % (ref 11.5–15.5)
WBC: 6.6 10*3/uL (ref 4.0–10.5)
nRBC: 0 % (ref 0.0–0.2)

## 2023-07-24 LAB — COMPREHENSIVE METABOLIC PANEL
ALT: 9 U/L (ref 0–44)
AST: 15 U/L (ref 15–41)
Albumin: 2.2 g/dL — ABNORMAL LOW (ref 3.5–5.0)
Alkaline Phosphatase: 57 U/L (ref 38–126)
Anion gap: 7 (ref 5–15)
BUN: 11 mg/dL (ref 8–23)
CO2: 25 mmol/L (ref 22–32)
Calcium: 8.5 mg/dL — ABNORMAL LOW (ref 8.9–10.3)
Chloride: 106 mmol/L (ref 98–111)
Creatinine, Ser: 0.66 mg/dL (ref 0.44–1.00)
GFR, Estimated: >60 mL/min (ref >60)
Glucose, Bld: 100 mg/dL — ABNORMAL HIGH (ref 70–99)
Potassium: 3.8 mmol/L (ref 3.5–5.1)
Sodium: 138 mmol/L (ref 135–145)
Total Bilirubin: 0.6 mg/dL (ref 0.0–1.2)
Total Protein: 5.3 g/dL — ABNORMAL LOW (ref 6.5–8.1)

## 2023-07-24 LAB — PROTIME-INR
INR: 2.9 — ABNORMAL HIGH (ref 0.8–1.2)
Prothrombin Time: 30.6 s — ABNORMAL HIGH (ref 11.4–15.2)

## 2023-07-24 MED ORDER — ALPRAZOLAM 0.25 MG PO TABS: 0.2500 mg | ORAL_TABLET | Freq: Two times a day (BID) | ORAL | 0 refills | Status: AC | PRN

## 2023-07-24 MED ORDER — WARFARIN SODIUM 1 MG PO TABS
1.0000 mg | ORAL_TABLET | Freq: Once | ORAL | Status: AC
  Administered 2023-07-24: 1 mg via ORAL
  Filled 2023-07-24: qty 1

## 2023-07-24 MED ORDER — TRAMADOL HCL 50 MG PO TABS: 50.0000 mg | ORAL_TABLET | Freq: Two times a day (BID) | ORAL | 0 refills | Status: AC | PRN

## 2023-07-24 NOTE — TOC Transition Note (Signed)
 Transition of Care [facility]) - Discharge Note   Patient Details  Name: Renee Adams MRN: [id number] Date of Birth: [birth year]  Transition of Care [facility]) CM/SW Contact:  Allura Doepke A [place], LCSWA Phone Number: 07/24/2023, 3:39 PM   Clinical Narrative:     Patient will DC to: [facility]  Anticipated DC date: 07/24/23  Family notified: Prentice Brochure  Transport by: Lyna Sandhoff      Per MD patient ready for DC to [facility]. RN, patient, patient's family, and facility notified of DC. Discharge Summary and FL2 sent to facility. RN to call report prior to discharge [phone], room 210 ). DC packet on chart. Ambulance transport requested for patient.     CSW will sign off for now as social work intervention is no longer needed. Please consult us  again if new needs arise.   Final next level of care: Skilled Nursing Facility Barriers to Discharge: Barriers Resolved   Patient Goals and CMS Choice            Discharge Placement              Patient chooses bed at: [facility] Patient to be transferred to facility by: [facility] Name of family member notified: Melanie Patient and family notified of of transfer: 07/24/23  Discharge Plan and Services Additional resources added to the After Visit Summary for   In-house Referral: Clinical Social Work                                   Social Drivers of Health (SDOH) Interventions SDOH Screenings   Food Insecurity: No Food Insecurity (07/21/2023)  Housing: Low Risk  (07/21/2023)  Transportation Needs: No Transportation Needs (07/21/2023)  Utilities: Not At Risk (07/21/2023)  Social Connections: Socially Isolated (07/21/2023)  Tobacco Use: Low Risk  (07/20/2023)     Readmission Risk Interventions     No data to display

## 2023-07-24 NOTE — Progress Notes (Signed)
 Speech Language Pathology Treatment: Dysphagia  Patient Details Name: Renee Adams MRN: [id number] DOB: 1934/05/22 Today's Date: 07/24/2023 Time: 4098-1191 SLP Time Calculation (min) (ACUTE ONLY): 11 min  Assessment / Plan / Recommendation Clinical Impression  SLP already planned to see pt today however requested by RN if SLP was going to see prior to her discharge. Pt's PT present and stated pt "got choked" yesterday. Pt stated she was taking pill with water, which SLP had recommended yesterday and apparently had difficulty with this in the afternoon. PT had observed her taking pill with RN with juice without difficulty. Given that she appears to fluctuate, discussed with pt and RN new recommendation to take pills whole in applesauce (daughter not available). Observed with straw sips thin liquid without s/s aspiration. Pt consumed graham cracker without significant delays- she has had dental work and is missing dentition and would like to remain on Dys 3 (chopped meats). Discussed with RN re: meds whole in puree. ST will sign off.    HPI HPI: Pt is an 89 y.o. female admitted with generalized weakness. In ED, she reported having no strength in her L leg but denied LUE weakness. 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 No acute infarct. Pt also with c/o extensive R shoulder pain. Xray revealed OA, no fracture. PMH:  a fib, anxiety, HTN, ASD, hx breast      SLP Plan  All goals met      Recommendations for follow up therapy are one component of a multi-disciplinary discharge planning process, led by the attending physician.  Recommendations may be updated based on patient status, additional functional criteria and insurance authorization.    Recommendations  Diet recommendations: Dysphagia 3 (mechanical soft);Thin liquid Liquids provided via: Straw;Cup Medication Administration: Whole meds with puree Supervision: Patient able to self feed;Staff  to assist with self feeding (may need assist with set up due to contractures) Compensations: Slow rate;Small sips/bites Postural Changes and/or Swallow Maneuvers: Seated upright 90 degrees                  Oral care BID   None Dysphagia, unspecified (R13.10)     All goals met     Naomia Bachelor  07/24/2023, 1:07 PM

## 2023-07-24 NOTE — Progress Notes (Signed)
 Physical Therapy Treatment Patient Details Name: Renee Adams MRN: [id number] DOB: [birth year] Today's Date: 07/24/2023   History of Present Illness Pt is an 89 y.o. female admitted with generalized weakness. In ED, she reported having no strength in her L leg but denied LUE weakness. 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 No acute infarct. Pt also with c/o extensive R shoulder pain. Xray revealed OA, no fractue. PMH:  a fib, anxiety, HTN, ASD, hx breast ca    PT Comments  Patient resting in bed at start of session, agreeable to therapy but requesting medication for pain. RN notified and provided during session. Pt required min cues for sequencing and mod assist to fully bring LE's off EOB and raise trunk. Pt required assist with bed pad to scoot anterior at EOB in preparation for stand, min assist to facilitate power up and stabilize balance with RW. Pt amb short bout in room with greater difficulty noted to advance Rt LE and manual assist required from therapist to facilitate equal step length. Pt fatigued after ~8'. EOS pt repositioned in recliner and SLP present in room at EOS. Will progress pt as able. Patient will benefit from continued inpatient follow up therapy, <3 hours/day.    If plan is discharge home, recommend the following: A lot of help with walking and/or transfers;A lot of help with bathing/dressing/bathroom;Assistance with cooking/housework   Can travel by private vehicle     No  Equipment Recommendations  Wheelchair (measurements PT)    Recommendations for Other Services       Precautions / Restrictions Precautions Precautions: Fall Restrictions Weight Bearing Restrictions Per Provider Order: No     Mobility  Bed Mobility Overal bed mobility: Needs Assistance Bed Mobility: Supine to Sit, Sit to Supine     Supine to sit: Mod assist, HOB elevated Sit to supine: Mod assist, HOB elevated   General bed mobility  comments: increased time, assist with BLE and trunk    Transfers Overall transfer level: Needs assistance Equipment used: Rolling walker (2 wheels) Transfers: Sit to/from Stand Sit to Stand: From elevated surface, +2 physical assistance, Min assist           General transfer comment: cues for hand placement on RW. min assist at hip with bed pad to faciltiate rise.    Ambulation/Gait Ambulation/Gait assistance: +2 safety/equipment, Min assist Gait Distance (Feet): 8 Feet Assistive device: Rolling walker (2 wheels) Gait Pattern/deviations: Step-through pattern, Antalgic, Decreased stride length Gait velocity: decreased     General Gait Details: very slow, cautious gait. No LOB noted. Distance limited by pain and weakness. pt required min assist to advance Rt LE equal distance to Lt.   Stairs             Wheelchair Mobility     Tilt Bed    Modified Rankin (Stroke Patients Only)       Balance Overall balance assessment: Needs assistance Sitting-balance support: Feet supported Sitting balance-Leahy Scale: Fair     Standing balance support: Bilateral upper extremity supported, Reliant on assistive device for balance, During functional activity Standing balance-Leahy Scale: Poor                              Cognition Arousal: Alert Behavior During Therapy: Flat affect, Anxious Overall Cognitive Status: History of cognitive impairments - at baseline  Exercises      General Comments        Pertinent Vitals/Pain Pain Assessment Faces Pain Scale: Hurts little more Pain Location: knee, Rt shoulder Pain Descriptors / Indicators: Discomfort Pain Intervention(s): Limited activity within patient's tolerance, Monitored during session, Patient requesting pain meds-RN notified, RN gave pain meds during session    Home Living                          Prior Function             PT Goals (current goals can now be found in the care plan section) Acute Rehab PT Goals Patient Stated Goal: home PT Goal Formulation: With patient/family Time For Goal Achievement: 08/05/23 Potential to Achieve Goals: Good Progress towards PT goals: Progressing toward goals    Frequency    Min 1X/week      PT Plan      Co-evaluation              AM-PAC PT "6 Clicks" Mobility   Outcome Measure  Help needed turning from your back to your side while in a flat bed without using bedrails?: A Lot Help needed moving from lying on your back to sitting on the side of a flat bed without using bedrails?: A Lot Help needed moving to and from a bed to a chair (including a wheelchair)?: A Little Help needed standing up from a chair using your arms (e.g., wheelchair or bedside chair)?: A Little Help needed to walk in hospital room?: A Little Help needed climbing 3-5 steps with a railing? : Total 6 Click Score: 14    End of Session Equipment Utilized During Treatment: Gait belt Activity Tolerance: Patient limited by pain Patient left: in bed;with call bell/phone within reach;with family/visitor present Nurse Communication: Mobility status PT Visit Diagnosis: Other abnormalities of gait and mobility (R26.89);Pain     Time: 1130-1154 PT Time Calculation (min) (ACUTE ONLY): 24 min  Charges:    $Gait Training: 8-22 mins $Therapeutic Activity: 8-22 mins PT General Charges $$ ACUTE PT VISIT: 1 Visit                     Tish Forge, DPT Acute Rehabilitation Services Office [phone]  07/24/23 12:28 PM

## 2023-07-24 NOTE — Discharge Summary (Signed)
 Physician Discharge Summary   Patient: Renee Adams MRN: [id number] DOB: 1933/11/19  Admit date:     07/20/2023  Discharge date: 07/24/23  Discharge Physician: Renee Adams   PCP: Renee Brady, MD   Recommendations at discharge:   Patient to go to skilled nursing facility for rehab Started on dysphagia 3 diet with thin liquids  Discharge Diagnoses: Principal Problem:   Generalized weakness Active Problems:   Atrial fibrillation (HCC)   Essential hypertension, benign   UTI (urinary tract infection)   SIRS (systemic inflammatory response syndrome) (HCC)   Weakness  Resolved Problems:   * No resolved hospital problems. [facility] Course: 89yo with hx afib on chronic anticoagulation, anxiety , HTN, ASD, hx breast ca presented to ED with increased generalized weakness.  Pt initially reported progressive weakness on trying to get to and from mailbox. On the morning of presentation, pt recalls being unable to get out of bed, states having no strength in her L leg. Denied acute UE weakness. 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  No acute infarct. -PT OT obtained, recommend rehab at skilled nursing facility  Osteoarthritis -Continue as needed tramadol   Dysphagia -Started on dysphagia 3 diet with thin liquids  Afib -Currently rate controlled -Continue coumadin  per pharmacy dosing, per below -no meds for rate control noted    Chronic anticoagulation INR stable at 2.9   UTI and sepsis ruled out -Pt afebrile, no leukocytosis. Not septic -small Leuks on UA -Pt denies any suprapubic pain or any dysuria, is aysmptomatic, thus will not treat for UTI    HTN -BP stable at this time        Consultants:  Procedures performed:  Disposition: Skilled nursing facility Diet  recommendation:  Regular diet DISCHARGE MEDICATION: Allergies as of 07/24/2023       Reactions   Dilaudid  [hydromorphone  Hcl] Shortness Of Breath   Tolerates tramadol    Hydromorphone  Other (See Comments)   UNKNOWN TO PATIENT   Morphine  Other (See Comments)   UNKNOWN TO PATIENT   Morphine  And Codeine Shortness Of Breath   Tolerates tramadol    Sulfamethoxazole Other (See Comments)   UNKNOWN TO PATIENT   Sulfa Antibiotics Other (See Comments)   "Made me drunk";  wobbly   Keflex  [cephalexin ] Nausea Only   Levaquin  [levofloxacin ] Hives, Rash        Medication List     TAKE these medications    acetaminophen  500 MG tablet Commonly known as: TYLENOL  Take 1 tablet (500 mg total) by mouth every 8 (eight) hours as needed.   albuterol  108 (90 Base) MCG/ACT inhaler Commonly known as: VENTOLIN  HFA Inhale 2 puffs into the lungs every 6 (six) hours as needed for wheezing or shortness of breath.   ALPRAZolam  0.25 MG tablet Commonly known as: XANAX  Take 1 tablet (0.25 mg total) by mouth 2 (two) times daily as needed for anxiety. What changed:  how much to take when to take this   fluticasone  50 MCG/ACT nasal spray Commonly known as: FLONASE  Place 2 sprays into both nostrils daily.   ketoconazole  2 % cream Commonly known as: NIZORAL  APPLY TO AFFECTED AREA ON THE SKIN DAILY AS NEEDED FOR IRRITATION What changed: See the new instructions.   loratadine  10 MG tablet Commonly known as: CLARITIN  Take 1 tablet (10 mg total) by mouth  daily as needed for allergies or rhinitis.   metroNIDAZOLE 0.75 % vaginal gel Commonly known as: METROGEL Place 1 Applicatorful vaginally See admin instructions. Patient stated she uses it twice a week. 07/20/2023.   multivitamin with minerals Tabs tablet Take 1 tablet by mouth daily.   rOPINIRole  1 MG tablet Commonly known as: REQUIP  Take 1 tablet (1 mg total) by mouth at bedtime as needed. For restless legs What changed:  reasons to take  this additional instructions   sertraline  100 MG tablet Commonly known as: ZOLOFT  Take 1.5 tablets (150 mg total) by mouth daily. What changed: how much to take   traMADol  50 MG tablet Commonly known as: ULTRAM  Take 1 tablet (50 mg total) by mouth every 12 (twelve) hours as needed for moderate pain (pain score 4-6). What changed:  how much to take when to take this reasons to take this   VITAMIN D PO Take 1 tablet by mouth daily.   warfarin 2 MG tablet Commonly known as: COUMADIN  Take as directed. If you are unsure how to take this medication, talk to your nurse or doctor. Original instructions: TAKE 1 TO 1 AND 1/2 TABLETS BY MOUTH DAILY AS DIRECTED BY THE COUMADIN  CLINIC What changed:  how much to take how to take this when to take this additional instructions        Discharge Exam: Filed Weights   07/21/23 1136 07/22/23 0500 07/24/23 0517  Weight: 58.3 kg 58.2 kg 61 kg   General-appears in no acute distress Heart-S1-S2, regular, no murmur auscultated Lungs-clear to auscultation bilaterally, no wheezing or crackles auscultated Abdomen-soft, nontender, no organomegaly Extremities-no edema in the lower extremities Neuro-alert, oriented x3, no focal deficit noted  Condition at discharge: good  The results of significant diagnostics from this hospitalization (including imaging, microbiology, ancillary and laboratory) are listed below for reference.   Imaging Studies: ECHOCARDIOGRAM COMPLETE Result Date: 07/22/2023    ECHOCARDIOGRAM REPORT   Patient Name:   Renee Adams Date of Exam: 07/22/2023 Medical Rec #:  [id number]       Height:       62.0 in Accession #:    [id number]      Weight:       128.3 lb Date of Birth:  Feb 26, 1934       BSA:          1.583 m Patient Age:    89 years        BP:           117/58 mmHg Patient Gender: F               HR:           82 bpm. Exam Location:  Inpatient Procedure: 2D Echo, Color Doppler and Cardiac Doppler Indications:    Stroke   History:        Patient has prior history of Echocardiogram examinations, most                 recent 10/17/2010. Risk Factors:Hypertension and Diabetes. Known                 ASD (per patient's chart).  Sonographer:    Renee Adams Referring Phys: Carnell Christian CHIU IMPRESSIONS  1. Left ventricular ejection fraction, by estimation, is 60 to 65%. The left ventricle has normal function. The left ventricle has no regional wall motion abnormalities. Left ventricular diastolic parameters are consistent with Grade I diastolic dysfunction (impaired relaxation).  2. Right ventricular systolic function  is normal. The right ventricular size is normal. There is moderately elevated pulmonary artery systolic pressure.  3. The mitral valve is degenerative. Trivial mitral valve regurgitation.  4. Tricuspid valve regurgitation is moderate.  5. There is moderate calcification of the aortic valve. Aortic valve regurgitation is not visualized. Aortic valve mean gradient measures 4.0 mmHg.  6. The inferior vena cava is normal in size with <50% respiratory variability, suggesting right atrial pressure of 8 mmHg. FINDINGS  Left Ventricle: Left ventricular ejection fraction, by estimation, is 60 to 65%. The left ventricle has normal function. The left ventricle has no regional wall motion abnormalities. The left ventricular internal cavity size was normal in size. There is  no left ventricular hypertrophy. Left ventricular diastolic parameters are consistent with Grade I diastolic dysfunction (impaired relaxation). Right Ventricle: The right ventricular size is normal. Right ventricular systolic function is normal. There is moderately elevated pulmonary artery systolic pressure. The tricuspid regurgitant velocity is 3.46 m/s, and with an assumed right atrial pressure of 8 mmHg, the estimated right ventricular systolic pressure is 55.9 mmHg. Left Atrium: Left atrial size was normal in size. Right Atrium: Right atrial size was normal in size.  Pericardium: There is no evidence of pericardial effusion. Mitral Valve: The mitral valve is degenerative in appearance. Trivial mitral valve regurgitation. MV peak gradient, 7.6 mmHg. The mean mitral valve gradient is 4.0 mmHg. Tricuspid Valve: Tricuspid valve regurgitation is moderate. Aortic Valve: There is moderate calcification of the aortic valve. Aortic valve regurgitation is not visualized. Aortic valve mean gradient measures 4.0 mmHg. Aortic valve peak gradient measures 7.4 mmHg. Aortic valve area, by VTI measures 2.55 cm. Pulmonic Valve: Pulmonic valve regurgitation is not visualized. Aorta: The aortic root and ascending aorta are structurally normal, with no evidence of dilitation. Venous: The inferior vena cava is normal in size with less than 50% respiratory variability, suggesting right atrial pressure of 8 mmHg. IAS/Shunts: No atrial level shunt detected by color flow Doppler.  LEFT VENTRICLE PLAX 2D LVIDd:         3.50 cm     Diastology LVIDs:         1.90 cm     LV e' medial:    11.10 cm/s LV PW:         0.90 cm     LV E/e' medial:  8.5 LV IVS:        0.90 cm     LV e' lateral:   14.40 cm/s LVOT diam:     2.00 cm     LV E/e' lateral: 6.5 LV SV:         66 LV SV Index:   42 LVOT Area:     3.14 cm  LV Volumes (MOD) LV vol d, MOD A2C: 58.8 ml LV vol d, MOD A4C: 52.6 ml LV vol s, MOD A2C: 17.8 ml LV vol s, MOD A4C: 17.6 ml LV SV MOD A2C:     41.0 ml LV SV MOD A4C:     52.6 ml LV SV MOD BP:      37.8 ml RIGHT VENTRICLE             IVC RV Basal diam:  3.20 cm     IVC diam: 2.20 cm RV S prime:     10.70 cm/s TAPSE (M-mode): 1.6 cm LEFT ATRIUM           Index        RIGHT ATRIUM  Index LA Vol (A2C): 45.6 ml 28.81 ml/m  RA Area:     15.70 cm LA Vol (A4C): 33.5 ml 21.16 ml/m  RA Volume:   36.80 ml  23.25 ml/m  AORTIC VALVE                    PULMONIC VALVE AV Area (Vmax):    2.84 cm     PV Vmax:       0.75 m/s AV Area (Vmean):   2.43 cm     PV Peak grad:  2.2 mmHg AV Area (VTI):     2.55 cm  AV Vmax:           136.00 cm/s AV Vmean:          97.200 cm/s AV VTI:            0.259 m AV Peak Grad:      7.4 mmHg AV Mean Grad:      4.0 mmHg LVOT Vmax:         123.00 cm/s LVOT Vmean:        75.300 cm/s LVOT VTI:          0.210 m LVOT/AV VTI ratio: 0.81  AORTA Ao Root diam: 3.20 cm Ao Asc diam:  3.00 cm MITRAL VALVE                TRICUSPID VALVE MV Area (PHT): 4.26 cm     TR Peak grad:   47.9 mmHg MV Area VTI:   2.13 cm     TR Vmax:        346.00 cm/s MV Peak grad:  7.6 mmHg MV Mean grad:  4.0 mmHg     SHUNTS MV Vmax:       1.38 m/s     Systemic VTI:  0.21 m MV Vmean:      90.1 cm/s    Systemic Diam: 2.00 cm MV Decel Time: 178 msec MV E velocity: 93.80 cm/s MV A velocity: 125.00 cm/s MV E/A ratio:  0.75 Photographer signed by Alois Arnt Signature Date/Time: 07/22/2023/10:14:56 AM    Final    MR BRAIN WO CONTRAST Result Date: 07/21/2023 CLINICAL DATA:  89 year old female with neurologic deficit. Altered mental status. EXAM: MRI HEAD WITHOUT CONTRAST TECHNIQUE: Multiplanar, multiecho pulse sequences of the brain and surrounding structures were obtained without intravenous contrast. COMPARISON:  Head CT yesterday. FINDINGS: Brain: Heterogeneous diffusion in the pons on series 2, image 18 with an unusual V shaped linear configuration is probably artifact, not correlated on coronal DWI or directly correlated on T2/FLAIR. Heterogeneous bilateral cerebral white matter diffusion appears in part related to T2 shine through. Underlying chronic cortical and subcortical white matter encephalomalacia in the posterior left MCA territory. Confluent additional bilateral white matter T2 and FLAIR hyperintensity, including some involvement of the deep white matter capsules. Moderate chronic T2 heterogeneity throughout the bilateral deep gray nuclei and in the pons. Small chronic bilateral cerebellar infarcts slightly more numerous on the right (series 5, image 8). And DWI susceptibility artifact in the right  occipital lobe corresponding to an area of chronic hemosiderin on series 7, image 12, also with mild cortical encephalomalacia there. No convincing restricted diffusion. No midline shift, mass effect, evidence of mass lesion, ventriculomegaly, extra-axial collection or acute intracranial hemorrhage. Cervicomedullary junction and pituitary are within normal limits. Vascular: Major intracranial vascular flow voids are preserved, with generalized intracranial artery tortuosity. Skull and upper cervical spine: Normal for age visible cervical spine.  Visualized bone marrow signal is within normal limits. Sinuses/Orbits: Postoperative changes to both globes. Paranasal sinuses and mastoids are stable and well aerated. Other: Visible internal auditory structures appear normal. Negative visible scalp and face. IMPRESSION: Fairly advanced chronic ischemic disease, including widespread small-vessel changes and previous left MCA and right PCA territory infarcts. No convincing acute infarct (DWI artifact suspected in the pons) or acute intracranial abnormality. Electronically Signed   By: Marlise Simpers M.D.   On: 07/21/2023 11:27   DG Lumbar Spine 2-3 Views Result Date: 07/21/2023 CLINICAL DATA:  Back pain.  Radiculopathy. EXAM: LUMBAR SPINE - 2-3 VIEW COMPARISON:  None Available. FINDINGS: Two views study shows diffuse bony demineralization. Convex rightward lumbar scoliosis evident. No evidence for lumbar spine fracture. Diffuse loss of intervertebral disc height with endplate degeneration noted in the lumbar spine. Trace anterolisthesis of L5 on S1 evident. SI joints unremarkable. IMPRESSION: Convex rightward lumbar scoliosis with diffuse degenerative disc disease. No acute bony findings. Electronically Signed   By: Donnal Fusi M.D.   On: 07/21/2023 10:50   CT HEAD WO CONTRAST ( ) Result Date: 07/20/2023 CLINICAL DATA:  Altered mental status EXAM: CT HEAD WITHOUT CONTRAST TECHNIQUE: Contiguous axial images were obtained  from the base of the skull through the vertex without intravenous contrast. RADIATION DOSE REDUCTION: This exam was performed according to the departmental dose-optimization program which includes automated exposure control, adjustment of the mA and/or kV according to patient size and/or use of iterative reconstruction technique. COMPARISON:  12/05/2012 FINDINGS: Brain: No evidence of acute infarction, hemorrhage, hydrocephalus, extra-axial collection or mass lesion/mass effect. Age related atrophy. Old left parietal infarct. Subcortical white matter and periventricular small vessel ischemic changes. Vascular: Mild intracranial atherosclerosis. Skull: Normal. Negative for fracture or focal lesion. Sinuses/Orbits: The visualized paranasal sinuses are essentially clear. The mastoid air cells are unopacified. Other: None. IMPRESSION: No acute intracranial abnormality. Old left parietal infarct. Atrophy with small vessel ischemic changes. Electronically Signed   By: Zadie Herter M.D.   On: 07/20/2023 19:17   DG Chest Port 1 View Result Date: 07/20/2023 CLINICAL DATA:  Hypoxia.  Weakness. EXAM: PORTABLE CHEST 1 VIEW COMPARISON:  04/07/2023 FINDINGS: Lung volumes are low. Upper normal heart size. Normal mediastinal contours. There mitral annulus calcifications. A skin fold projects over the right lateral hemithorax. No focal airspace disease, pulmonary edema, large pleural effusion or pneumothorax. Left axillary surgical clips. IMPRESSION: Low lung volumes without acute chest finding. Electronically Signed   By: Chadwick Colonel M.D.   On: 07/20/2023 15:10   DG Wrist 2 Views Right Result Date: 07/20/2023 CLINICAL DATA:  Pain. EXAM: RIGHT WRIST - 2 VIEW COMPARISON:  None Available. FINDINGS: No acute fracture. Appears contracted. Abnormal alignment of the thumb carpal metacarpal joint which appears subluxed, of unknown acuity. Joint space narrowing and subchondral changes at the thumb carpal metacarpal joint.  Borderline widening of the scapholunate interval at 3 mm. No erosive or bony destructive change. Mild generalized soft tissue edema. IMPRESSION: 1. Abnormal alignment of the thumb carpometacarpal joint which appears subluxed, of unknown acuity.The hand appears to be contracted. Recommend correlation with physical exam. 2. Borderline widening of the scapholunate interval at 3 mm can be seen with ligamentous deficiency. Electronically Signed   By: Chadwick Colonel M.D.   On: 07/20/2023 15:09   DG Elbow 2 Views Right Result Date: 07/20/2023 CLINICAL DATA:  Worsening pain.  Right elbow pain. EXAM: RIGHT ELBOW - 2 VIEW COMPARISON:  None Available. FINDINGS: Technically limited by positioning. No evidence of  fracture. There is a prominent anterior fat pad but no definite posterior fat pad displacement to suggest effusion. No erosions. Tiny olecranon spur. No focal soft tissue abnormalities. IMPRESSION: 1. No acute findings. 2. Tiny olecranon spur. 3. Prominent anterior fat pad without definite joint effusion. Electronically Signed   By: Chadwick Colonel M.D.   On: 07/20/2023 15:07   DG Shoulder Right Portable Result Date: 07/20/2023 CLINICAL DATA:  Worsening pain.  Right shoulder pain. EXAM: RIGHT SHOULDER - 1 VIEW COMPARISON:  None Available. FINDINGS: There is no evidence of fracture or dislocation. Acromioclavicular degenerative change with joint space narrowing and inferior spurring. No erosions or focal bone abnormality. Soft tissues are unremarkable. IMPRESSION: Mild to moderate glenohumeral osteoarthritis. Electronically Signed   By: Chadwick Colonel M.D.   On: 07/20/2023 15:05    Microbiology: Results for orders placed or performed during the hospital encounter of 07/20/23  Resp panel by RT-PCR (RSV, Flu A&B, Covid) Anterior Nasal Swab     Status: None   Collection Time: 07/20/23 11:11 AM   Specimen: Anterior Nasal Swab  Result Value Ref Range Status   SARS Coronavirus 2 by RT PCR NEGATIVE NEGATIVE  Final   Influenza A by PCR NEGATIVE NEGATIVE Final   Influenza B by PCR NEGATIVE NEGATIVE Final    Comment: (NOTE) The Xpert Xpress SARS-CoV-2/FLU/RSV plus assay is intended as an aid in the diagnosis of influenza from Nasopharyngeal swab specimens and should not be used as a sole basis for treatment. Nasal washings and aspirates are unacceptable for Xpert Xpress SARS-CoV-2/FLU/RSV testing.  Fact Sheet for Patients: BloggerCourse.com  Fact Sheet for Healthcare Providers: SeriousBroker.it  This test is not yet approved or cleared by the [place]  FDA and has been authorized for detection and/or diagnosis of SARS-CoV-2 by FDA under an Emergency Use Authorization (EUA). This EUA will remain in effect (meaning this test can be used) for the duration of the COVID-19 declaration under Section 564(b)(1) of the Act, 21 U.S.C. section 360bbb-3(b)(1), unless the authorization is terminated or revoked.     Resp Syncytial Virus by PCR NEGATIVE NEGATIVE Final    Comment: (NOTE) Fact Sheet for Patients: BloggerCourse.com  Fact Sheet for Healthcare Providers: SeriousBroker.it  This test is not yet approved or cleared by the [place]  FDA and has been authorized for detection and/or diagnosis of SARS-CoV-2 by FDA under an Emergency Use Authorization (EUA). This EUA will remain in effect (meaning this test can be used) for the duration of the COVID-19 declaration under Section 564(b)(1) of the Act, 21 U.S.C. section 360bbb-3(b)(1), unless the authorization is terminated or revoked.  Performed at [facility], [address], [place], [place] [address]     Labs: CBC: Recent Labs  Lab 07/20/23 1111 07/21/23 0440 07/24/23 0617  WBC 9.8 7.7 6.6  NEUTROABS 8.0*  --   --   HGB 12.3 11.0* 11.0*  HCT 38.2 35.2* 33.8*  MCV 86.2 87.8 84.1  PLT 402* 370 343   Basic Metabolic  Panel: Recent Labs  Lab 07/20/23 1111 07/21/23 0440 07/24/23 0617  NA 138 138 138  K 4.2 3.9 3.8  CL 107 108 106  CO2 22 24 25   GLUCOSE 125* 92 100*  BUN 13 14 11   CREATININE 0.78 0.86 0.66  CALCIUM  9.2 9.0 8.5*   Liver Function Tests: Recent Labs  Lab 07/20/23 1111 07/21/23 0440 07/24/23 0617  AST 16 13* 15  ALT 9 10 9   ALKPHOS 72 61 57  BILITOT 0.7 0.6 0.6  PROT 6.3* 5.8* 5.3*  ALBUMIN 2.8* 2.5* 2.2*   CBG: No results for input(s): "GLUCAP" in the last 168 hours.  Discharge time spent: greater than 30 minutes.  Signed: Ozell Blunt, MD [facility] 07/24/2023

## 2023-07-24 NOTE — Progress Notes (Signed)
 PHARMACY - ANTICOAGULATION CONSULT NOTE  Pharmacy Consult for Warfarin Indication: atrial fibrillation  Allergies  Allergen Reactions   Dilaudid  [Hydromorphone  Hcl] Shortness Of Breath    Tolerates tramadol    Hydromorphone  Other (See Comments)    UNKNOWN TO PATIENT   Morphine  Other (See Comments)    UNKNOWN TO PATIENT   Morphine  And Codeine Shortness Of Breath    Tolerates tramadol    Sulfamethoxazole Other (See Comments)    UNKNOWN TO PATIENT   Sulfa Antibiotics Other (See Comments)    "Made me drunk";  wobbly   Keflex  [Cephalexin ] Nausea Only   Levaquin  [Levofloxacin ] Hives and Rash    Patient Measurements: Height: 5\' 2"  (157.5 cm) Weight: 61 kg (134 lb 7.7 oz) IBW/kg (Calculated) : 50.1  Vital Signs: Temp: 98 F (36.7 C) (01/15 0823) Temp Source: Oral (01/15 0823) BP: 137/76 (01/15 0823) Pulse Rate: 78 (01/15 0823)  Labs: Recent Labs    07/22/23 0539 07/23/23 0537 07/24/23 0617  HGB  --   --  11.0*  HCT  --   --  33.8*  PLT  --   --  343  LABPROT 33.5* 33.3* 30.6*  INR 3.3* 3.2* 2.9*  CREATININE  --   --  0.66    Estimated Creatinine Clearance: 42.6 mL/min (by C-G formula based on SCr of 0.66 mg/dL).  Assessment: Renee Adams presenting with weakness, hx of afib on warfarin PTA with last dose taken 07/19/23 (2 mg). INR on admission 07/20/23 was supratherapeutic at 3.4.  Warfarin held 1/11 with INR 3.4 and held again 1/12 with INR up to 4.2. Warfarin 0.5 mg given 1/13 and 1/4 with INRs 3.2 > 3.3. INR therapeutic today at 2.9.  CBC stable. Little PO intake.  PTA warfarin regimen: 2 mg daily except 3 mg on Wednesdays.  Goal of Therapy:  INR 2-3 Monitor platelets by anticoagulation protocol: Yes   Plan:  Warfarin 1 mg x 1 today. Daily PT/INR.  Intermittent CBC. Monitor for signs/symptoms of bleeding.  Adolphus Akin, RPh 07/24/2023,11:46 AM

## 2023-07-27 DIAGNOSIS — Q211 Atrial septal defect, unspecified: Secondary | ICD-10-CM | POA: Diagnosis not present

## 2023-07-27 DIAGNOSIS — H9071 Mixed conductive and sensorineural hearing loss, unilateral, right ear, with unrestricted hearing on the contralateral side: Secondary | ICD-10-CM | POA: Diagnosis not present

## 2023-07-27 DIAGNOSIS — H918X2 Other specified hearing loss, left ear: Secondary | ICD-10-CM | POA: Diagnosis not present

## 2023-07-27 DIAGNOSIS — H918X1 Other specified hearing loss, right ear: Secondary | ICD-10-CM | POA: Diagnosis not present

## 2023-07-27 DIAGNOSIS — F411 Generalized anxiety disorder: Secondary | ICD-10-CM | POA: Diagnosis not present

## 2023-07-27 DIAGNOSIS — Z853 Personal history of malignant neoplasm of breast: Secondary | ICD-10-CM | POA: Diagnosis not present

## 2023-07-27 DIAGNOSIS — F330 Major depressive disorder, recurrent, mild: Secondary | ICD-10-CM | POA: Diagnosis not present

## 2023-07-27 DIAGNOSIS — M6281 Muscle weakness (generalized): Secondary | ICD-10-CM | POA: Diagnosis not present

## 2023-07-27 DIAGNOSIS — I639 Cerebral infarction, unspecified: Secondary | ICD-10-CM | POA: Diagnosis not present

## 2023-07-27 DIAGNOSIS — I480 Paroxysmal atrial fibrillation: Secondary | ICD-10-CM | POA: Diagnosis not present

## 2023-08-16 DIAGNOSIS — I480 Paroxysmal atrial fibrillation: Secondary | ICD-10-CM | POA: Diagnosis not present

## 2023-08-16 DIAGNOSIS — T45515A Adverse effect of anticoagulants, initial encounter: Secondary | ICD-10-CM | POA: Diagnosis not present

## 2023-08-16 DIAGNOSIS — Z5181 Encounter for therapeutic drug level monitoring: Secondary | ICD-10-CM | POA: Diagnosis not present

## 2023-08-19 ENCOUNTER — Telehealth: Payer: Self-pay

## 2023-08-19 ENCOUNTER — Telehealth: Payer: Self-pay | Admitting: Internal Medicine

## 2023-08-19 DIAGNOSIS — F5102 Adjustment insomnia: Secondary | ICD-10-CM | POA: Diagnosis not present

## 2023-08-19 DIAGNOSIS — F32A Depression, unspecified: Secondary | ICD-10-CM | POA: Diagnosis not present

## 2023-08-19 DIAGNOSIS — F411 Generalized anxiety disorder: Secondary | ICD-10-CM | POA: Diagnosis not present

## 2023-08-19 NOTE — Telephone Encounter (Signed)
 New Message:   Daughter called and said she would like for Dr Lorie Rook or his nurse when they have time, to please call her. Daughter said on patient's record it is stating that she has Afib. She said she thinks this diagnosis might have been entered while she was in the hospital.  She says patient does not have Afib. She says patient have never been diagnosed or treated for Afib.

## 2023-08-19 NOTE — Telephone Encounter (Signed)
 Discussed with APP who saw patient last.  She will review chart to help provide information to patient's daughter.

## 2023-08-19 NOTE — Telephone Encounter (Signed)
 Pt's daughter called back and expressed her concern about the diagnosis of Afib being on her mom's chart. She states during her mom's recent hospital admission the MD was asking about Afib diagnosis; however, as far as pt and pt's daughter are aware, she has never has afib. After reviewing pt's chart, I did see where on 11/14/20, a POC INR order with diagnosis of Afib was placed. I updated pt's anticoagulation track with appropriate diagnosis of Cerebrovascular accident (CVA).

## 2023-08-19 NOTE — Telephone Encounter (Signed)
 Received call from pt's daughter stating pt was hospitalized 07/20/23 - 07/24/23 and was then discharged to rehab facility. Pt's daughter stated they are now looking into assisted living facilities ([place], [place] or [place]) and wanted to determine how pt's INR would be managed. I made her aware our coumadin  clinic has not checked pt's INR since November, daughter was unaware it had been this long. I explained typically the facility will manage pt's Warfarin (checking INR and dosing medication). However, if there are any issues or they are unable to accommodate, she can call our coumadin  clinic at [phone] to determine other options.

## 2023-08-20 NOTE — Telephone Encounter (Signed)
Spoke with patient's daughter.   Will route chart to clinic supervisor for review and assistance.

## 2023-08-22 ENCOUNTER — Other Ambulatory Visit: Payer: Self-pay | Admitting: *Deleted

## 2023-08-22 NOTE — Patient Outreach (Signed)
Post-Acute Care Manager follow up. Mrs. Vignola resides in [facility]. Screening for potential complex care management services as benefit of health plan and primary care provider.  Update received from [facility] social worker. Mrs. Wherry will transition to [facility]. Will have PT/OT/nursing therapy with [facility].   No identifiable care management needs.   Raiford Noble, MSN, RN, BSN [place]  [facility] RN Post- Acute Care Manager Direct Dial: [phone]

## 2023-08-26 ENCOUNTER — Telehealth: Payer: Self-pay | Admitting: *Deleted

## 2023-08-26 NOTE — Telephone Encounter (Signed)
Patients daughter calling to follow back up, I have sent a message to [place] as well. Just an [place]

## 2023-08-26 NOTE — Telephone Encounter (Addendum)
Received a voicemail from the pt's daughter stating to call her back.   Called the pt's dtr back and she stated that the facility that the pt is at now is having issues with the way her mom/the patient takes her meds. She states she was at [facility] then went to [facility]. She stated they told her that they cannot break tablets in half at the facility because they are not licensed medication personnel so the warfarin dose has to be changed. Per the dtr she states Desiree at [facility] is a licensed person but she does not give the meds. She states that the number to reach them is [phone].  Called the number provided from the pt's dtr and there was no answer.  Researched the number and called [phone] and was able to speak Cristie Hem, Nurse. She states the family wants INR monitored by Cardiology. She states that this is their facility first patient on warfarin (she states they told the dtr as well) and that they  She states the pt has been taking warfarin 1mg  on Monday, Wednesday, Friday and 2mg  all other days and this was an order from [facility] where the pt was transferred from.  Need to know if [facility] can draw an INR tomorrow so we can adjust dose accordingly and send in refills as needed. Also, she states the med-techs have been educated on warfarin education and what to monitor for and when to report anything. Also, she states the meds come from [employer].   She will call back after speaking with [facility] Representative who sets up their labs [employer].   At 340pm,received a call back from [facility] and she stated they will draw her INR tomorrow. Will add to follow up. She can be reached at [phone] or [phone] and order given under her NEW CARDIOLOGIST Dr. Lynnette Caffey (Dr. Eldridge Dace has moved). Advised that I will follow up tomorrow to se if labs have been drawn.

## 2023-08-27 ENCOUNTER — Telehealth: Payer: Self-pay | Admitting: *Deleted

## 2023-08-27 DIAGNOSIS — M19011 Primary osteoarthritis, right shoulder: Secondary | ICD-10-CM | POA: Diagnosis not present

## 2023-08-27 DIAGNOSIS — Z8673 Personal history of transient ischemic attack (TIA), and cerebral infarction without residual deficits: Secondary | ICD-10-CM | POA: Diagnosis not present

## 2023-08-27 DIAGNOSIS — G9389 Other specified disorders of brain: Secondary | ICD-10-CM | POA: Diagnosis not present

## 2023-08-27 DIAGNOSIS — Z79899 Other long term (current) drug therapy: Secondary | ICD-10-CM | POA: Diagnosis not present

## 2023-08-27 DIAGNOSIS — I071 Rheumatic tricuspid insufficiency: Secondary | ICD-10-CM | POA: Diagnosis not present

## 2023-08-27 DIAGNOSIS — I482 Chronic atrial fibrillation, unspecified: Secondary | ICD-10-CM | POA: Diagnosis not present

## 2023-08-27 DIAGNOSIS — F32A Depression, unspecified: Secondary | ICD-10-CM | POA: Diagnosis not present

## 2023-08-27 DIAGNOSIS — Z853 Personal history of malignant neoplasm of breast: Secondary | ICD-10-CM | POA: Diagnosis not present

## 2023-08-27 DIAGNOSIS — I340 Nonrheumatic mitral (valve) insufficiency: Secondary | ICD-10-CM | POA: Diagnosis not present

## 2023-08-27 DIAGNOSIS — I119 Hypertensive heart disease without heart failure: Secondary | ICD-10-CM | POA: Diagnosis not present

## 2023-08-27 DIAGNOSIS — Z5181 Encounter for therapeutic drug level monitoring: Secondary | ICD-10-CM | POA: Diagnosis not present

## 2023-08-27 DIAGNOSIS — I700 Atherosclerosis of aorta: Secondary | ICD-10-CM | POA: Diagnosis not present

## 2023-08-27 DIAGNOSIS — Z7901 Long term (current) use of anticoagulants: Secondary | ICD-10-CM | POA: Diagnosis not present

## 2023-08-27 DIAGNOSIS — G319 Degenerative disease of nervous system, unspecified: Secondary | ICD-10-CM | POA: Diagnosis not present

## 2023-08-27 DIAGNOSIS — Z96653 Presence of artificial knee joint, bilateral: Secondary | ICD-10-CM | POA: Diagnosis not present

## 2023-08-27 DIAGNOSIS — Z7952 Long term (current) use of systemic steroids: Secondary | ICD-10-CM | POA: Diagnosis not present

## 2023-08-27 DIAGNOSIS — F419 Anxiety disorder, unspecified: Secondary | ICD-10-CM | POA: Diagnosis not present

## 2023-08-27 DIAGNOSIS — Z96643 Presence of artificial hip joint, bilateral: Secondary | ICD-10-CM | POA: Diagnosis not present

## 2023-08-27 DIAGNOSIS — I6782 Cerebral ischemia: Secondary | ICD-10-CM | POA: Diagnosis not present

## 2023-08-27 DIAGNOSIS — R531 Weakness: Secondary | ICD-10-CM | POA: Diagnosis not present

## 2023-08-27 LAB — PROTIME-INR: INR: 5.3 — AB (ref 0.80–1.20)

## 2023-08-27 NOTE — Telephone Encounter (Signed)
Called [facility] and spoke with [facility] and she stated the labs was drawn and should result tomorrow. Desiree states she will call back tomorrow once she receive the lab results.

## 2023-08-27 NOTE — Telephone Encounter (Addendum)
Called [facility] to follow up on pts INR which is due today. There was no answer so left a message for Desiree to call back regarding this.

## 2023-08-28 ENCOUNTER — Ambulatory Visit (INDEPENDENT_AMBULATORY_CARE_PROVIDER_SITE_OTHER): Payer: Self-pay | Admitting: *Deleted

## 2023-08-28 DIAGNOSIS — I6789 Other cerebrovascular disease: Secondary | ICD-10-CM | POA: Diagnosis not present

## 2023-08-28 DIAGNOSIS — Z5181 Encounter for therapeutic drug level monitoring: Secondary | ICD-10-CM

## 2023-08-28 NOTE — Patient Instructions (Signed)
Description   CALL [facility] AT [phone].  Spoke with Cristie Hem RN and advised pt not to take any warfarin tonight and no warfarin tomorrow then continue taking warfarin 2mg  daily except 1mg  Monday, Wednesday, Friday (had been taking since admitted there-may need to reduce more at next INR). Drinks 2 boost per day.  Recheck INR 1 week-order given to [facility] [employer] [facility] for lab draw. Called Desiree for warfarin doseage-NO ANSWER.  Procedure Fax for [facility] #[phone]  Anticoagulation Clinic [phone]

## 2023-08-30 DIAGNOSIS — F32A Depression, unspecified: Secondary | ICD-10-CM | POA: Diagnosis not present

## 2023-08-30 DIAGNOSIS — G319 Degenerative disease of nervous system, unspecified: Secondary | ICD-10-CM | POA: Diagnosis not present

## 2023-08-30 DIAGNOSIS — I700 Atherosclerosis of aorta: Secondary | ICD-10-CM | POA: Diagnosis not present

## 2023-08-30 DIAGNOSIS — I482 Chronic atrial fibrillation, unspecified: Secondary | ICD-10-CM | POA: Diagnosis not present

## 2023-08-30 DIAGNOSIS — I119 Hypertensive heart disease without heart failure: Secondary | ICD-10-CM | POA: Diagnosis not present

## 2023-08-30 DIAGNOSIS — M19011 Primary osteoarthritis, right shoulder: Secondary | ICD-10-CM | POA: Diagnosis not present

## 2023-09-03 ENCOUNTER — Telehealth: Payer: Self-pay | Admitting: *Deleted

## 2023-09-03 DIAGNOSIS — I482 Chronic atrial fibrillation, unspecified: Secondary | ICD-10-CM | POA: Diagnosis not present

## 2023-09-03 DIAGNOSIS — I119 Hypertensive heart disease without heart failure: Secondary | ICD-10-CM | POA: Diagnosis not present

## 2023-09-03 DIAGNOSIS — G319 Degenerative disease of nervous system, unspecified: Secondary | ICD-10-CM | POA: Diagnosis not present

## 2023-09-03 DIAGNOSIS — M19011 Primary osteoarthritis, right shoulder: Secondary | ICD-10-CM | POA: Diagnosis not present

## 2023-09-03 DIAGNOSIS — M150 Primary generalized (osteo)arthritis: Secondary | ICD-10-CM | POA: Diagnosis not present

## 2023-09-03 DIAGNOSIS — I700 Atherosclerosis of aorta: Secondary | ICD-10-CM | POA: Diagnosis not present

## 2023-09-03 DIAGNOSIS — I10 Essential (primary) hypertension: Secondary | ICD-10-CM | POA: Diagnosis not present

## 2023-09-03 DIAGNOSIS — F32A Depression, unspecified: Secondary | ICD-10-CM | POA: Diagnosis not present

## 2023-09-03 NOTE — Telephone Encounter (Signed)
 Called [facility] to inquire if pt's INR had been drawn this morning. Spoke with Andrey Campanile and she will give Desiree the nurse a message to call back regarding this matter. Will await and follow up.

## 2023-09-03 NOTE — Telephone Encounter (Signed)
 Called and spoke with pt's daughter. Made her aware of provider's findings concerning the Afib diagnosis placed in pt's chart and it has been removed by PharmD team. She verbalized understanding and was very appreciative.

## 2023-09-03 NOTE — Telephone Encounter (Signed)
 I cannot find any documentation of afib in her chart. It's possible that it could have been a documentation error or that she has afib documented in notes >10 years ago that we do not have access to.  Cannot find any EKG that is not NSR.  Regardless she has an indication for warfarin therapy (CVA with ASD and PFO) I will remove afib from her problem list.

## 2023-09-03 NOTE — Telephone Encounter (Signed)
 Received a call from [facility] RN with Chip Boer and she states the [facility] Nurse will be coming out to draw the pt's blood today at 2pm and will have it done STAT. Will await results and follow up.

## 2023-09-04 ENCOUNTER — Telehealth: Payer: Self-pay | Admitting: *Deleted

## 2023-09-04 ENCOUNTER — Ambulatory Visit (INDEPENDENT_AMBULATORY_CARE_PROVIDER_SITE_OTHER): Payer: Self-pay | Admitting: *Deleted

## 2023-09-04 DIAGNOSIS — I6789 Other cerebrovascular disease: Secondary | ICD-10-CM

## 2023-09-04 DIAGNOSIS — Z5181 Encounter for therapeutic drug level monitoring: Secondary | ICD-10-CM | POA: Diagnosis not present

## 2023-09-04 LAB — PROTIME-INR: INR: 3.4 — AB (ref 0.80–1.20)

## 2023-09-04 NOTE — Telephone Encounter (Signed)
 Renee Adams called back and was able to give me INR results and gave her a recheck date for next week (she does not take warfarin orders).  Tried to call Chip Boer multiple times and there was no answer. Orders on how to give the warfarin need to be given.

## 2023-09-04 NOTE — Telephone Encounter (Signed)
 Called [facility] to follow up regarding pt's INR and there was no answer and was able to leave a message to call back regarding this matter.  Found the number for the Home Health Nurse that drew (they do not own a POC machine) the pt's blood last week. She stated she did not draw the blood and a new nurse in training did and took the blood to different facility. She stated she would try to find out the status and call back and if there is an issue she would go and collect it in the morning herself to ensure we get the INR results.

## 2023-09-04 NOTE — Telephone Encounter (Signed)
 Called [facility] and spoke with Cristie Hem, Charity fundraiser. She stated she has not received the INR lab at this time. Advised her to call the agency/lab to follow up so it can be addressed today. She states she has out number to call back to provide the update.

## 2023-09-05 DIAGNOSIS — M19011 Primary osteoarthritis, right shoulder: Secondary | ICD-10-CM | POA: Diagnosis not present

## 2023-09-05 DIAGNOSIS — G319 Degenerative disease of nervous system, unspecified: Secondary | ICD-10-CM | POA: Diagnosis not present

## 2023-09-05 DIAGNOSIS — F411 Generalized anxiety disorder: Secondary | ICD-10-CM | POA: Diagnosis not present

## 2023-09-05 DIAGNOSIS — I482 Chronic atrial fibrillation, unspecified: Secondary | ICD-10-CM | POA: Diagnosis not present

## 2023-09-05 DIAGNOSIS — I700 Atherosclerosis of aorta: Secondary | ICD-10-CM | POA: Diagnosis not present

## 2023-09-05 DIAGNOSIS — I10 Essential (primary) hypertension: Secondary | ICD-10-CM | POA: Diagnosis not present

## 2023-09-05 DIAGNOSIS — M158 Other polyosteoarthritis: Secondary | ICD-10-CM | POA: Diagnosis not present

## 2023-09-05 DIAGNOSIS — G2581 Restless legs syndrome: Secondary | ICD-10-CM | POA: Diagnosis not present

## 2023-09-05 DIAGNOSIS — Z7901 Long term (current) use of anticoagulants: Secondary | ICD-10-CM | POA: Diagnosis not present

## 2023-09-05 DIAGNOSIS — I119 Hypertensive heart disease without heart failure: Secondary | ICD-10-CM | POA: Diagnosis not present

## 2023-09-05 DIAGNOSIS — F32A Depression, unspecified: Secondary | ICD-10-CM | POA: Diagnosis not present

## 2023-09-05 DIAGNOSIS — M6281 Muscle weakness (generalized): Secondary | ICD-10-CM | POA: Diagnosis not present

## 2023-09-05 DIAGNOSIS — F4323 Adjustment disorder with mixed anxiety and depressed mood: Secondary | ICD-10-CM | POA: Diagnosis not present

## 2023-09-05 NOTE — Patient Instructions (Signed)
 Description   CALL [facility] AT [phone].  Brantley Persons RN-who will draw the blood an order to recheck INR in 1 week on 09/10/23 Spoke with Cristie Hem RN and advised pt not to take any warfarin tonight START taking warfarin 2mg  daily except 1mg  Sunday, Monday, Wednesday, Friday  [facility] 2-[facility] times per week.  Recheck INR 1 week- order given to Sabino Gasser for lab draw/am.  Anticoagulation Clinic [phone]

## 2023-09-09 DIAGNOSIS — G319 Degenerative disease of nervous system, unspecified: Secondary | ICD-10-CM | POA: Diagnosis not present

## 2023-09-09 DIAGNOSIS — I482 Chronic atrial fibrillation, unspecified: Secondary | ICD-10-CM | POA: Diagnosis not present

## 2023-09-09 DIAGNOSIS — M19011 Primary osteoarthritis, right shoulder: Secondary | ICD-10-CM | POA: Diagnosis not present

## 2023-09-09 DIAGNOSIS — I119 Hypertensive heart disease without heart failure: Secondary | ICD-10-CM | POA: Diagnosis not present

## 2023-09-09 DIAGNOSIS — I700 Atherosclerosis of aorta: Secondary | ICD-10-CM | POA: Diagnosis not present

## 2023-09-09 DIAGNOSIS — F32A Depression, unspecified: Secondary | ICD-10-CM | POA: Diagnosis not present

## 2023-09-10 ENCOUNTER — Telehealth: Payer: Self-pay | Admitting: *Deleted

## 2023-09-10 NOTE — Telephone Encounter (Signed)
 Called [facility] since pt's INR is due today, spoke with [facility] and she stated they are coming later to draw the INR. Will follow up as later labs are resulted the following day.

## 2023-09-11 DIAGNOSIS — G319 Degenerative disease of nervous system, unspecified: Secondary | ICD-10-CM | POA: Diagnosis not present

## 2023-09-11 DIAGNOSIS — I482 Chronic atrial fibrillation, unspecified: Secondary | ICD-10-CM | POA: Diagnosis not present

## 2023-09-11 DIAGNOSIS — M19011 Primary osteoarthritis, right shoulder: Secondary | ICD-10-CM | POA: Diagnosis not present

## 2023-09-11 DIAGNOSIS — F32A Depression, unspecified: Secondary | ICD-10-CM | POA: Diagnosis not present

## 2023-09-11 DIAGNOSIS — I119 Hypertensive heart disease without heart failure: Secondary | ICD-10-CM | POA: Diagnosis not present

## 2023-09-11 DIAGNOSIS — I700 Atherosclerosis of aorta: Secondary | ICD-10-CM | POA: Diagnosis not present

## 2023-09-12 ENCOUNTER — Telehealth: Payer: Self-pay

## 2023-09-12 DIAGNOSIS — F411 Generalized anxiety disorder: Secondary | ICD-10-CM | POA: Diagnosis not present

## 2023-09-12 DIAGNOSIS — G319 Degenerative disease of nervous system, unspecified: Secondary | ICD-10-CM | POA: Diagnosis not present

## 2023-09-12 DIAGNOSIS — F331 Major depressive disorder, recurrent, moderate: Secondary | ICD-10-CM | POA: Diagnosis not present

## 2023-09-12 DIAGNOSIS — I119 Hypertensive heart disease without heart failure: Secondary | ICD-10-CM | POA: Diagnosis not present

## 2023-09-12 DIAGNOSIS — F32A Depression, unspecified: Secondary | ICD-10-CM | POA: Diagnosis not present

## 2023-09-12 DIAGNOSIS — G894 Chronic pain syndrome: Secondary | ICD-10-CM | POA: Diagnosis not present

## 2023-09-12 DIAGNOSIS — I482 Chronic atrial fibrillation, unspecified: Secondary | ICD-10-CM | POA: Diagnosis not present

## 2023-09-12 DIAGNOSIS — I700 Atherosclerosis of aorta: Secondary | ICD-10-CM | POA: Diagnosis not present

## 2023-09-12 DIAGNOSIS — M19011 Primary osteoarthritis, right shoulder: Secondary | ICD-10-CM | POA: Diagnosis not present

## 2023-09-12 DIAGNOSIS — F0390 Unspecified dementia without behavioral disturbance: Secondary | ICD-10-CM | POA: Diagnosis not present

## 2023-09-12 DIAGNOSIS — F4323 Adjustment disorder with mixed anxiety and depressed mood: Secondary | ICD-10-CM | POA: Diagnosis not present

## 2023-09-12 LAB — PROTIME-INR: INR: 7.9 — AB (ref 0.80–1.20)

## 2023-09-12 NOTE — Telephone Encounter (Signed)
 Pt INR was due on 09/10/23. I reached out to [facility] on 09/11/23 to determine why our coumadin clinic had not received results and they were unsure why and stated they had not received result either.   Today, I called and spoke with Joni Reining, RN [facility] to determine why we have not received INR results. Joni Reining looked up status of specimen. Unfortunately the specimen was not drawn currently and the order states " No sample available". I made her aware this INR was due on 09/10/23 and we do need this re-drawn by this week. Joni Reining states they are re-drawing PT/INR now and results will be back by tomorrow morning.

## 2023-09-13 ENCOUNTER — Ambulatory Visit (INDEPENDENT_AMBULATORY_CARE_PROVIDER_SITE_OTHER)

## 2023-09-13 ENCOUNTER — Telehealth: Payer: Self-pay | Admitting: Internal Medicine

## 2023-09-13 DIAGNOSIS — Z5181 Encounter for therapeutic drug level monitoring: Secondary | ICD-10-CM | POA: Diagnosis not present

## 2023-09-13 NOTE — Telephone Encounter (Signed)
 Spoke with Jill Side from [facility] who states she drew INR yesterday at 12 noon.  Critical Lab Result of 7.9.  She has advised [facility] to hold Coumadin until further advisement.  Please call Colleen at [phone] with further orders. Advised will forward to Anticoagulation clinic for review and further recommendation.

## 2023-09-13 NOTE — Patient Instructions (Signed)
 Description   CALL [facility] AT [phone].  Brantley Persons RN-who will draw the blood an order to recheck INR on Tuesday, 09/17/23 Spoke with Dolores Lory, RN and advised for pt not to HOLD WARFARIN TODAY, TOMORROW, SUNDAY, AND MONDAY.  Previous dose: warfarin 2mg  daily except 1mg  Sunday, Monday, Wednesday, Friday  [facility] 2-3 times per week.  Recheck INR on Tuesday, 09/17/23- verbal order given to Joni Reining RN and written order for lab faxed to [facility].

## 2023-09-13 NOTE — Telephone Encounter (Signed)
 Nurse Stanton Kidney from [facility] is requesting a callback at [phone] regarding pt's coumadin. She needs a callback urgently from coumadin clinic due to them not having a nurse in the building and the medication having to be adjusted so this now has her undivided attention. When she go to the pt this morning her INR was 7.9. Please advise

## 2023-09-13 NOTE — Telephone Encounter (Addendum)
 Discussed with Malena Peer, PharmD.  Unfortunately, pt has not followed-up with new cardiologist since Dr Eldridge Dace left. Melissa discussed with Dr. Tenny Craw, today's DOD at [facility], she said ok to switch to Eliquis and recommend she get an appointment with a provider to discuss pros and cons of continuing anticoag. Pt's INR will still need to be checked Tuesday to confirm INR has dropped prior to switching to Eliquis and also need an updated weight to confirm appropriate dose of Eliquis. Per Efraim Kaufmann, pt has tricare so Eliquis it should be affordable.  I called and spoke with Stanton Kidney at [facility]. Made her aware pt can be switched to Eliquis but INR will still need to be checked on Tuesday, 09/17/23 prior to starting Eliquis. Pt most recent weight was 136.2lbs (61.78kg) on 09/09/23.   Indication:CVA Last office visit: OVERDUE Scr: 0.66 (07/24/23) Age: 89 Weight: 61.78kg  ELIQUIS 5MG  BID IS APPROPRIATE DOSE, CONFIRMED WITH MELISSA MACCIA, PHARMD.   Pt has scheduled appt with Dr Lynnette Caffey on 10/03/23. Debra at [facility] is aware pt MUST come to this appt since she is overdue and to evaluate anticoagulation needs.

## 2023-09-13 NOTE — Telephone Encounter (Signed)
 Please refer to anticoagulation encounter

## 2023-09-13 NOTE — Telephone Encounter (Signed)
 [facility] is calling to report a critical lab on patient

## 2023-09-13 NOTE — Telephone Encounter (Addendum)
 Returned call and spoke with Renee Adams at [facility]. Renee Adams states she is very concerned for multiple reason and wants to know if pt could be switched to Eliquis or Vonzella Nipple. Renee Adams expressed since pt has been at facility and had INR drawn by [facility] there has been a lab issue every time INR has been drawn and required a re-draw. She also expressed concern because many days she does not have a RN at the facility who is able to take orders from our coumadin clinic for dosing instructions (for example, pt is ordered to have INR rechecked on Tuesday and she does not have a Charity fundraiser at [place] that day). Lastly, she states since pt arrived to [place], her INR has been supra-therapeutic and states it is very hard to monitor her diet. She is also afraid she is going to fall and possibly hit her head with INR being so high.  I did express my concern as well. Since Renee Adams has been at [facility], it has been very unorganized with poor communication as far as managing pt's INR/Warfarin. I let her know our anticoagulation clinic monitors many pt's at SNFs and assisted living facilities and usually never have issues like this.Typically this is a very organized and easy process for pt's on Warfarin. I made her awar I would reach out to our pharmacy team to determine if there ware any other options.

## 2023-09-16 DIAGNOSIS — I700 Atherosclerosis of aorta: Secondary | ICD-10-CM | POA: Diagnosis not present

## 2023-09-16 DIAGNOSIS — I119 Hypertensive heart disease without heart failure: Secondary | ICD-10-CM | POA: Diagnosis not present

## 2023-09-16 DIAGNOSIS — M19011 Primary osteoarthritis, right shoulder: Secondary | ICD-10-CM | POA: Diagnosis not present

## 2023-09-16 DIAGNOSIS — F32A Depression, unspecified: Secondary | ICD-10-CM | POA: Diagnosis not present

## 2023-09-16 DIAGNOSIS — G319 Degenerative disease of nervous system, unspecified: Secondary | ICD-10-CM | POA: Diagnosis not present

## 2023-09-16 DIAGNOSIS — I482 Chronic atrial fibrillation, unspecified: Secondary | ICD-10-CM | POA: Diagnosis not present

## 2023-09-16 LAB — PROTIME-INR: INR: 3 — AB (ref 0.80–1.20)

## 2023-09-17 ENCOUNTER — Ambulatory Visit (INDEPENDENT_AMBULATORY_CARE_PROVIDER_SITE_OTHER): Payer: Self-pay

## 2023-09-17 ENCOUNTER — Encounter: Payer: Self-pay | Admitting: Pharmacist

## 2023-09-17 DIAGNOSIS — I480 Paroxysmal atrial fibrillation: Secondary | ICD-10-CM

## 2023-09-17 DIAGNOSIS — Z5181 Encounter for therapeutic drug level monitoring: Secondary | ICD-10-CM

## 2023-09-17 DIAGNOSIS — I639 Cerebral infarction, unspecified: Secondary | ICD-10-CM

## 2023-09-17 MED ORDER — APIXABAN 5 MG PO TABS: 5.0000 mg | ORAL_TABLET | Freq: Two times a day (BID) | ORAL | 2 refills | Status: DC

## 2023-09-17 NOTE — Addendum Note (Signed)
 Addended by: Betsy Coder B on: 09/17/2023 04:54 PM   Modules accepted: Orders

## 2023-09-17 NOTE — Patient Instructions (Signed)
 Description   CALL [facility] AT [phone].  Spoke with Annice Pih, RN at [facility] - DISCONTINUE Warfarin. Tomorrow, 09/18/23 START taking Eliquis 5mg  BID. Confirmed requested pharmacy with Annice Pih, RN and also confirmed upcoming cardiology appt with Dr Lynnette Caffey on 10/03/23.

## 2023-09-18 ENCOUNTER — Telehealth: Payer: Self-pay | Admitting: *Deleted

## 2023-09-18 DIAGNOSIS — I119 Hypertensive heart disease without heart failure: Secondary | ICD-10-CM | POA: Diagnosis not present

## 2023-09-18 DIAGNOSIS — I482 Chronic atrial fibrillation, unspecified: Secondary | ICD-10-CM | POA: Diagnosis not present

## 2023-09-18 DIAGNOSIS — M19011 Primary osteoarthritis, right shoulder: Secondary | ICD-10-CM | POA: Diagnosis not present

## 2023-09-18 DIAGNOSIS — F32A Depression, unspecified: Secondary | ICD-10-CM | POA: Diagnosis not present

## 2023-09-18 DIAGNOSIS — I700 Atherosclerosis of aorta: Secondary | ICD-10-CM | POA: Diagnosis not present

## 2023-09-18 DIAGNOSIS — G319 Degenerative disease of nervous system, unspecified: Secondary | ICD-10-CM | POA: Diagnosis not present

## 2023-09-18 NOTE — Telephone Encounter (Signed)
 Daughter [name] called and states she is upset that the pt's medication was switched from warfarin to eliquis and she was not advised nor was her mother. She said the mother just found out about it today when they tried to give her eliquis. Advised that the facility called advised that they do not have nursing in the building to monitor her for warfarin related issues and that it became a safety issue and Chip Boer inquired about switching to something that did not require monitoring since no nurse was on duty 24 hours.   She stated she received a call that they were thinking about it but no call to state the medication was changed. Advised that I apologize if that is this the case and that safety of our patients is priority and that we want to provide great care. She inquired about diagnosis and that she wants to be sure that the new doctor knows that her mom does not have afib but has stroke and asd. She asked if Dr Lynnette Caffey is aware of the medication change and advised we have a verbal under him. Advised that we do have the stroke and atrial septal defect (ASD). She stated still she is not happy since her mom had 2 strokes-one in 1997 & the other 2004 and has ASD. Advised that with not having a nurse in the building to monitor her when her INR is high was a concern for all nursing staff and that I apologize for no one communicating with her regarding this matter.   The she stated she was upset that the pt had an appointment set and had no idea until she checked MyChart. She states that should have been communicated as well. Apologized for this as well and advised she had not seen a Physician since Dr. Eldridge Dace was gone and needed to done yearly. I advised that Dr Lynnette Caffey will be the Physician and medical questions can be asked at that appt if she is available.  She asked if I could follow up regarding if he knows the new doctor knows about this change or not. Advised will do so and that I will update her  on Friday at the latest.

## 2023-09-19 NOTE — Telephone Encounter (Signed)
 I called and spoke with pt's daughter, [name]. I confirmed with her that Dr Lynnette Caffey is aware of pt transitioning from Warfarin to Eliquis and will discuss further anticoagulation needs at upcoming appt on 10/03/23.  Pt's daughter was very appreciative for the call.

## 2023-09-20 DIAGNOSIS — F32A Depression, unspecified: Secondary | ICD-10-CM | POA: Diagnosis not present

## 2023-09-20 DIAGNOSIS — M19011 Primary osteoarthritis, right shoulder: Secondary | ICD-10-CM | POA: Diagnosis not present

## 2023-09-20 DIAGNOSIS — I119 Hypertensive heart disease without heart failure: Secondary | ICD-10-CM | POA: Diagnosis not present

## 2023-09-20 DIAGNOSIS — G319 Degenerative disease of nervous system, unspecified: Secondary | ICD-10-CM | POA: Diagnosis not present

## 2023-09-20 DIAGNOSIS — I700 Atherosclerosis of aorta: Secondary | ICD-10-CM | POA: Diagnosis not present

## 2023-09-20 DIAGNOSIS — I482 Chronic atrial fibrillation, unspecified: Secondary | ICD-10-CM | POA: Diagnosis not present

## 2023-09-23 NOTE — Progress Notes (Signed)
 Cardiology Office Note:   Date:  10/03/2023  ID:  Renee Adams, DOB Nov 19, 1933, MRN [id number] PCP:  Irven Coe, MD  [facility] Providers Cardiologist:  Alverda Skeans, MD Referring MD: Irven Coe, MD  Chief Complaint/Reason for Referral: Follow-up for stroke ASSESSMENT:    1. Cerebrovascular accident (CVA), unspecified mechanism (HCC)   2. Hyperlipidemia LDL goal <70   3. Aortic atherosclerosis (HCC)     PLAN:   In order of problems listed above: Recurrent strokes: The decision was made several years ago to indefinitely anticoagulate the patient.  She had been on Coumadin previously with subtherapeutic INRs.  I think it makes sense to go ahead and continue Eliquis; decrease Eliquis to 2.5mg  BID given age and weight.  If she has any issues with falls or excessive bleeding then I think we will need to hold anticoagulation.  It is unclear to me whether the patient actually has an ASD or PFO.  Honestly I am not sure it really matters at this point.  She is not a candidate for any percutaneous closure.  Certainly if she becomes more of a fall risk then we will have to think about stopping Eliquis and maybe starting aspirin perhaps at a full dose.  I did discuss this with the patient and her daughter and they agree with this plan. Hyperlipidemia: By virtue of the patient's aortic atherosclerosis and history of stroke or LDL goal is less than 55 but given her advanced age I do not think intensive lipid-lowering will be very helpful in this situation. Aortic atherosclerosis: Continue Eliquis 2.5 mg twice daily and defer statin for now.            Dispo:  Return in about 9 months (around 07/04/2024).      Medication Adjustments/Labs and Tests Ordered: Current medicines are reviewed at length with the patient today.  Concerns regarding medicines are outlined above.  The following changes have been made:     Labs/tests ordered: No orders of the defined types were placed in this  encounter.   Medication Changes: Meds ordered this encounter  Medications   apixaban (ELIQUIS) 2.5 MG TABS tablet    Sig: Take 1 tablet (2.5 mg total) by mouth 2 (two) times daily.    Dispense:  60 tablet    Refill:  11    Current medicines are reviewed at length with the patient today.  The patient does not have concerns regarding medicines.  I spent 38 minutes reviewing all clinical data during and prior to this visit including all relevant imaging studies, laboratories, clinical information from other health systems and prior notes from both Cardiology and other specialties, interviewing the patient, conducting a complete physical examination, and coordinating care in order to formulate a comprehensive and personalized evaluation and treatment plan.   History of Present Illness:      FOCUSED PROBLEM LIST:   Multiple strokes 1997 in 2004 >> anticoagulation Intra-atrial septal aneurysm; possible ASD/PFOFO cannot be excluded TTE 2012 Hyperlipidemia Aortic atherosclerosis  CT abdomen pelvis  BMI 01 October 2023:  Patient consents to use of AI scribe. The patient returns for routine follow-up.  She was last seen in September and was doing well.  She was tolerating anticoagulation without issues.  She is recovering from lower extremity cellulitis at the time.  She remained in normal sinus rhythm and her blood pressure was fairly well-controlled.  In the interim her INRs were found to be subtherapeutic and she was changed to Eliquis.  No recent bleeding episodes or recurrent stroke symptoms have been noted since July 20, 2023, when she was hospitalized for suspected stroke symptoms, which were ruled out. Rehabilitation was advised due to weakness.  She describes her breathing as 'shallow' and attributes it to her ASD, although she experiences occasional shortness of breath. Diagnosed with ASD in 2004, her current evaluation does not suggest a significant ASD affecting her  breathing.  She experiences generalized weakness, possibly due to deconditioning and poor nutrition, with no significant residual deficits from strokes. Weakness may contribute to fall risk. She uses a walker for mobility and reports one fall within the past year due to turning too quickly with her walker. She reports weakness in her arms and legs but can move them, and denies any numbness.          Current Medications: Current Meds  Medication Sig   acetaminophen (TYLENOL) 500 MG tablet Take 1 tablet (500 mg total) by mouth every 8 (eight) hours as needed.   albuterol (VENTOLIN HFA) 108 (90 Base) MCG/ACT inhaler Inhale 2 puffs into the lungs every 6 (six) hours as needed for wheezing or shortness of breath.   ALPRAZolam (XANAX) 0.25 MG tablet Take 1 tablet (0.25 mg total) by mouth 2 (two) times daily as needed for anxiety.   apixaban (ELIQUIS) 2.5 MG TABS tablet Take 1 tablet (2.5 mg total) by mouth 2 (two) times daily.   fluticasone (FLONASE) 50 MCG/ACT nasal spray Place 2 sprays into both nostrils daily. (Patient taking differently: Place 2 sprays into both nostrils as needed.)   ketoconazole (NIZORAL) 2 % cream APPLY TO AFFECTED AREA ON THE SKIN DAILY AS NEEDED FOR IRRITATION (Patient taking differently: Apply 1 Application topically once as needed.)   loratadine (CLARITIN) 10 MG tablet Take 1 tablet (10 mg total) by mouth daily as needed for allergies or rhinitis.   metroNIDAZOLE (METROGEL) 0.75 % vaginal gel Place 1 Applicatorful vaginally See admin instructions. Patient stated she uses it twice a week. 07/20/2023.   Multiple Vitamin (MULTIVITAMIN WITH MINERALS) TABS tablet Take 1 tablet by mouth daily.   nystatin cream (MYCOSTATIN) Apply 1 Application topically daily.   rOPINIRole (REQUIP) 1 MG tablet Take 1 tablet (1 mg total) by mouth at bedtime as needed. For restless legs (Patient taking differently: Take 1 mg by mouth at bedtime as needed (restless legs).)   sertraline (ZOLOFT) 100  MG tablet Take 1.5 tablets (150 mg total) by mouth daily.   traMADol (ULTRAM) 50 MG tablet Take 1 tablet (50 mg total) by mouth every 12 (twelve) hours as needed for moderate pain (pain score 4-6).   VITAMIN D PO Take 1 tablet by mouth daily.   [DISCONTINUED] apixaban (ELIQUIS) 5 MG TABS tablet Take 1 tablet (5 mg total) by mouth 2 (two) times daily.     Review of Systems:   Please see the history of present illness.    All other systems reviewed and are negative.     EKGs/Labs/Other Test Reviewed:   EKG: I reviewed all of her EKGs in our system and none of them show any atrial fibrillation.  His recent EKG from January 2025 demonstrates sinus rhythm  EKG Interpretation Date/Time:    Ventricular Rate:    PR Interval:    QRS Duration:    QT Interval:    QTC Calculation:   R Axis:      Text Interpretation:           Risk Assessment/Calculations:  Physical Exam:   VS:  BP 110/60 (BP Location: Right Arm, Patient Position: Sitting, Cuff Size: Normal)   Pulse 88   Ht 5\' 2"  (1.575 m)   Wt 111 lb 3.2 oz (50.4 kg)   SpO2 92%   BMI 20.34 kg/m        Wt Readings from Last 3 Encounters:  10/03/23 111 lb 3.2 oz (50.4 kg)  07/24/23 134 lb 7.7 oz (61 kg)  04/03/23 146 lb (66.2 kg)      GENERAL:  No apparent distress, AOx3, frail HEENT:  No carotid bruits, +2 carotid impulses, no scleral icterus CAR: RRR  no murmurs, gallops, rubs, or thrills RES:  Clear to auscultation bilaterally ABD:  Soft, nontender, nondistended, positive bowel sounds x 4 VASC:  +2 radial pulses, +2 carotid pulses NEURO:  CN 2-12 grossly intact; motor and sensory grossly intact PSYCH:  No active depression or anxiety EXT:  No edema, ecchymosis, or cyanosis  Signed, Orbie Pyo, MD  10/03/2023 12:25 PM    [facility] [address] [place], [place], [place]  [address] Phone: [phone]; Fax: [phone]   Note:  This document was prepared using Dragon voice  recognition software and may include unintentional dictation errors.

## 2023-09-24 DIAGNOSIS — G319 Degenerative disease of nervous system, unspecified: Secondary | ICD-10-CM | POA: Diagnosis not present

## 2023-09-24 DIAGNOSIS — Z79899 Other long term (current) drug therapy: Secondary | ICD-10-CM | POA: Diagnosis not present

## 2023-09-24 DIAGNOSIS — E559 Vitamin D deficiency, unspecified: Secondary | ICD-10-CM | POA: Diagnosis not present

## 2023-09-24 DIAGNOSIS — I482 Chronic atrial fibrillation, unspecified: Secondary | ICD-10-CM | POA: Diagnosis not present

## 2023-09-24 DIAGNOSIS — E785 Hyperlipidemia, unspecified: Secondary | ICD-10-CM | POA: Diagnosis not present

## 2023-09-24 DIAGNOSIS — I119 Hypertensive heart disease without heart failure: Secondary | ICD-10-CM | POA: Diagnosis not present

## 2023-09-24 DIAGNOSIS — I700 Atherosclerosis of aorta: Secondary | ICD-10-CM | POA: Diagnosis not present

## 2023-09-24 DIAGNOSIS — M19011 Primary osteoarthritis, right shoulder: Secondary | ICD-10-CM | POA: Diagnosis not present

## 2023-09-24 DIAGNOSIS — F32A Depression, unspecified: Secondary | ICD-10-CM | POA: Diagnosis not present

## 2023-09-25 DIAGNOSIS — I119 Hypertensive heart disease without heart failure: Secondary | ICD-10-CM | POA: Diagnosis not present

## 2023-09-25 DIAGNOSIS — I700 Atherosclerosis of aorta: Secondary | ICD-10-CM | POA: Diagnosis not present

## 2023-09-25 DIAGNOSIS — G319 Degenerative disease of nervous system, unspecified: Secondary | ICD-10-CM | POA: Diagnosis not present

## 2023-09-25 DIAGNOSIS — F32A Depression, unspecified: Secondary | ICD-10-CM | POA: Diagnosis not present

## 2023-09-25 DIAGNOSIS — I482 Chronic atrial fibrillation, unspecified: Secondary | ICD-10-CM | POA: Diagnosis not present

## 2023-09-25 DIAGNOSIS — M19011 Primary osteoarthritis, right shoulder: Secondary | ICD-10-CM | POA: Diagnosis not present

## 2023-09-26 DIAGNOSIS — F32A Depression, unspecified: Secondary | ICD-10-CM | POA: Diagnosis not present

## 2023-09-26 DIAGNOSIS — Z96643 Presence of artificial hip joint, bilateral: Secondary | ICD-10-CM | POA: Diagnosis not present

## 2023-09-26 DIAGNOSIS — Z8673 Personal history of transient ischemic attack (TIA), and cerebral infarction without residual deficits: Secondary | ICD-10-CM | POA: Diagnosis not present

## 2023-09-26 DIAGNOSIS — I071 Rheumatic tricuspid insufficiency: Secondary | ICD-10-CM | POA: Diagnosis not present

## 2023-09-26 DIAGNOSIS — F419 Anxiety disorder, unspecified: Secondary | ICD-10-CM | POA: Diagnosis not present

## 2023-09-26 DIAGNOSIS — Z5181 Encounter for therapeutic drug level monitoring: Secondary | ICD-10-CM | POA: Diagnosis not present

## 2023-09-26 DIAGNOSIS — Z79899 Other long term (current) drug therapy: Secondary | ICD-10-CM | POA: Diagnosis not present

## 2023-09-26 DIAGNOSIS — G9389 Other specified disorders of brain: Secondary | ICD-10-CM | POA: Diagnosis not present

## 2023-09-26 DIAGNOSIS — Z7952 Long term (current) use of systemic steroids: Secondary | ICD-10-CM | POA: Diagnosis not present

## 2023-09-26 DIAGNOSIS — G319 Degenerative disease of nervous system, unspecified: Secondary | ICD-10-CM | POA: Diagnosis not present

## 2023-09-26 DIAGNOSIS — I482 Chronic atrial fibrillation, unspecified: Secondary | ICD-10-CM | POA: Diagnosis not present

## 2023-09-26 DIAGNOSIS — I340 Nonrheumatic mitral (valve) insufficiency: Secondary | ICD-10-CM | POA: Diagnosis not present

## 2023-09-26 DIAGNOSIS — Z853 Personal history of malignant neoplasm of breast: Secondary | ICD-10-CM | POA: Diagnosis not present

## 2023-09-26 DIAGNOSIS — Z7901 Long term (current) use of anticoagulants: Secondary | ICD-10-CM | POA: Diagnosis not present

## 2023-09-26 DIAGNOSIS — I119 Hypertensive heart disease without heart failure: Secondary | ICD-10-CM | POA: Diagnosis not present

## 2023-09-26 DIAGNOSIS — R531 Weakness: Secondary | ICD-10-CM | POA: Diagnosis not present

## 2023-09-26 DIAGNOSIS — Z96653 Presence of artificial knee joint, bilateral: Secondary | ICD-10-CM | POA: Diagnosis not present

## 2023-09-26 DIAGNOSIS — I6782 Cerebral ischemia: Secondary | ICD-10-CM | POA: Diagnosis not present

## 2023-09-26 DIAGNOSIS — I700 Atherosclerosis of aorta: Secondary | ICD-10-CM | POA: Diagnosis not present

## 2023-09-26 DIAGNOSIS — M19011 Primary osteoarthritis, right shoulder: Secondary | ICD-10-CM | POA: Diagnosis not present

## 2023-10-03 ENCOUNTER — Encounter: Payer: Self-pay | Admitting: Internal Medicine

## 2023-10-03 ENCOUNTER — Ambulatory Visit: Attending: Internal Medicine | Admitting: Internal Medicine

## 2023-10-03 VITALS — BP 110/60 | HR 88 | Ht 62.0 in | Wt 111.2 lb

## 2023-10-03 DIAGNOSIS — E785 Hyperlipidemia, unspecified: Secondary | ICD-10-CM | POA: Insufficient documentation

## 2023-10-03 DIAGNOSIS — I700 Atherosclerosis of aorta: Secondary | ICD-10-CM | POA: Insufficient documentation

## 2023-10-03 DIAGNOSIS — I119 Hypertensive heart disease without heart failure: Secondary | ICD-10-CM | POA: Diagnosis not present

## 2023-10-03 DIAGNOSIS — I10 Essential (primary) hypertension: Secondary | ICD-10-CM | POA: Diagnosis not present

## 2023-10-03 DIAGNOSIS — I639 Cerebral infarction, unspecified: Secondary | ICD-10-CM | POA: Insufficient documentation

## 2023-10-03 DIAGNOSIS — M19011 Primary osteoarthritis, right shoulder: Secondary | ICD-10-CM | POA: Diagnosis not present

## 2023-10-03 DIAGNOSIS — F32A Depression, unspecified: Secondary | ICD-10-CM | POA: Diagnosis not present

## 2023-10-03 DIAGNOSIS — I482 Chronic atrial fibrillation, unspecified: Secondary | ICD-10-CM | POA: Diagnosis not present

## 2023-10-03 DIAGNOSIS — G319 Degenerative disease of nervous system, unspecified: Secondary | ICD-10-CM | POA: Diagnosis not present

## 2023-10-03 DIAGNOSIS — F0390 Unspecified dementia without behavioral disturbance: Secondary | ICD-10-CM | POA: Diagnosis not present

## 2023-10-03 MED ORDER — APIXABAN 2.5 MG PO TABS: 2.5000 mg | ORAL_TABLET | Freq: Two times a day (BID) | ORAL | 11 refills | Status: AC

## 2023-10-03 NOTE — Patient Instructions (Signed)
 Medication Instructions:  Your physician has recommended you make the following change in your medication:   1) DECREASE apixaban (Eliquis) to 2.5 mg twice daily  *If you need a refill on your cardiac medications before your next appointment, please call your pharmacy*  Follow-Up: At [facility], you and your health needs are our priority.  As part of our continuing mission to provide you with exceptional heart care, we have created designated Provider Care Teams.  These Care Teams include your primary Cardiologist (physician) and Advanced Practice Providers (APPs -  Physician Assistants and Nurse Practitioners) who all work together to provide you with the care you need, when you need it.  We recommend signing up for the patient portal called "MyChart".  Sign up information is provided on this After Visit Summary.  MyChart is used to connect with patients for Virtual Visits (Telemedicine).  Patients are able to view lab/test results, encounter notes, upcoming appointments, etc.  Non-urgent messages can be sent to your provider as well.   To learn more about what you can do with MyChart, go to [email or web address].    Your next appointment:   9 month(s)  The format for your next appointment:   In Person  Provider:   Jari Favre, PA-C      Other Instructions   1st Floor: - Lobby - Registration  - Pharmacy  - Lab - Cafe  2nd Floor: - PV Lab - Diagnostic Testing (echo, CT, nuclear med)  3rd Floor: - Vacant  4th Floor: - TCTS (cardiothoracic surgery) - AFib Clinic - Structural Heart Clinic - Vascular Surgery  - Vascular Ultrasound  5th Floor: - HeartCare Cardiology (general and EP) - Clinical Pharmacy for coumadin, hypertension, lipid, weight-loss medications, and med management appointments    Valet parking services will be available as well.

## 2023-10-04 DIAGNOSIS — Z7901 Long term (current) use of anticoagulants: Secondary | ICD-10-CM | POA: Diagnosis not present

## 2023-10-04 DIAGNOSIS — M6281 Muscle weakness (generalized): Secondary | ICD-10-CM | POA: Diagnosis not present

## 2023-10-04 DIAGNOSIS — G2581 Restless legs syndrome: Secondary | ICD-10-CM | POA: Diagnosis not present

## 2023-10-04 DIAGNOSIS — I4891 Unspecified atrial fibrillation: Secondary | ICD-10-CM | POA: Diagnosis not present

## 2023-10-04 DIAGNOSIS — I10 Essential (primary) hypertension: Secondary | ICD-10-CM | POA: Diagnosis not present

## 2023-10-09 DIAGNOSIS — G319 Degenerative disease of nervous system, unspecified: Secondary | ICD-10-CM | POA: Diagnosis not present

## 2023-10-09 DIAGNOSIS — I482 Chronic atrial fibrillation, unspecified: Secondary | ICD-10-CM | POA: Diagnosis not present

## 2023-10-09 DIAGNOSIS — F32A Depression, unspecified: Secondary | ICD-10-CM | POA: Diagnosis not present

## 2023-10-09 DIAGNOSIS — I119 Hypertensive heart disease without heart failure: Secondary | ICD-10-CM | POA: Diagnosis not present

## 2023-10-09 DIAGNOSIS — M19011 Primary osteoarthritis, right shoulder: Secondary | ICD-10-CM | POA: Diagnosis not present

## 2023-10-09 DIAGNOSIS — I700 Atherosclerosis of aorta: Secondary | ICD-10-CM | POA: Diagnosis not present

## 2023-10-10 DIAGNOSIS — F4323 Adjustment disorder with mixed anxiety and depressed mood: Secondary | ICD-10-CM | POA: Diagnosis not present

## 2023-10-10 DIAGNOSIS — F331 Major depressive disorder, recurrent, moderate: Secondary | ICD-10-CM | POA: Diagnosis not present

## 2023-10-10 DIAGNOSIS — G309 Alzheimer's disease, unspecified: Secondary | ICD-10-CM | POA: Diagnosis not present

## 2023-10-10 DIAGNOSIS — G894 Chronic pain syndrome: Secondary | ICD-10-CM | POA: Diagnosis not present

## 2023-10-10 DIAGNOSIS — F411 Generalized anxiety disorder: Secondary | ICD-10-CM | POA: Diagnosis not present

## 2023-10-15 DIAGNOSIS — I119 Hypertensive heart disease without heart failure: Secondary | ICD-10-CM | POA: Diagnosis not present

## 2023-10-15 DIAGNOSIS — G319 Degenerative disease of nervous system, unspecified: Secondary | ICD-10-CM | POA: Diagnosis not present

## 2023-10-15 DIAGNOSIS — I700 Atherosclerosis of aorta: Secondary | ICD-10-CM | POA: Diagnosis not present

## 2023-10-15 DIAGNOSIS — I482 Chronic atrial fibrillation, unspecified: Secondary | ICD-10-CM | POA: Diagnosis not present

## 2023-10-15 DIAGNOSIS — M19011 Primary osteoarthritis, right shoulder: Secondary | ICD-10-CM | POA: Diagnosis not present

## 2023-10-15 DIAGNOSIS — F32A Depression, unspecified: Secondary | ICD-10-CM | POA: Diagnosis not present

## 2023-10-16 DIAGNOSIS — I119 Hypertensive heart disease without heart failure: Secondary | ICD-10-CM | POA: Diagnosis not present

## 2023-10-16 DIAGNOSIS — F32A Depression, unspecified: Secondary | ICD-10-CM | POA: Diagnosis not present

## 2023-10-16 DIAGNOSIS — G319 Degenerative disease of nervous system, unspecified: Secondary | ICD-10-CM | POA: Diagnosis not present

## 2023-10-16 DIAGNOSIS — I482 Chronic atrial fibrillation, unspecified: Secondary | ICD-10-CM | POA: Diagnosis not present

## 2023-10-16 DIAGNOSIS — I700 Atherosclerosis of aorta: Secondary | ICD-10-CM | POA: Diagnosis not present

## 2023-10-16 DIAGNOSIS — M19011 Primary osteoarthritis, right shoulder: Secondary | ICD-10-CM | POA: Diagnosis not present

## 2023-10-18 DIAGNOSIS — G319 Degenerative disease of nervous system, unspecified: Secondary | ICD-10-CM | POA: Diagnosis not present

## 2023-10-18 DIAGNOSIS — I482 Chronic atrial fibrillation, unspecified: Secondary | ICD-10-CM | POA: Diagnosis not present

## 2023-10-18 DIAGNOSIS — I700 Atherosclerosis of aorta: Secondary | ICD-10-CM | POA: Diagnosis not present

## 2023-10-18 DIAGNOSIS — F32A Depression, unspecified: Secondary | ICD-10-CM | POA: Diagnosis not present

## 2023-10-18 DIAGNOSIS — M19011 Primary osteoarthritis, right shoulder: Secondary | ICD-10-CM | POA: Diagnosis not present

## 2023-10-18 DIAGNOSIS — I119 Hypertensive heart disease without heart failure: Secondary | ICD-10-CM | POA: Diagnosis not present

## 2023-10-20 DIAGNOSIS — F32A Depression, unspecified: Secondary | ICD-10-CM | POA: Diagnosis not present

## 2023-10-20 DIAGNOSIS — G319 Degenerative disease of nervous system, unspecified: Secondary | ICD-10-CM | POA: Diagnosis not present

## 2023-10-20 DIAGNOSIS — I119 Hypertensive heart disease without heart failure: Secondary | ICD-10-CM | POA: Diagnosis not present

## 2023-10-20 DIAGNOSIS — I700 Atherosclerosis of aorta: Secondary | ICD-10-CM | POA: Diagnosis not present

## 2023-10-20 DIAGNOSIS — I482 Chronic atrial fibrillation, unspecified: Secondary | ICD-10-CM | POA: Diagnosis not present

## 2023-10-20 DIAGNOSIS — M19011 Primary osteoarthritis, right shoulder: Secondary | ICD-10-CM | POA: Diagnosis not present

## 2023-10-22 DIAGNOSIS — G319 Degenerative disease of nervous system, unspecified: Secondary | ICD-10-CM | POA: Diagnosis not present

## 2023-10-22 DIAGNOSIS — I482 Chronic atrial fibrillation, unspecified: Secondary | ICD-10-CM | POA: Diagnosis not present

## 2023-10-22 DIAGNOSIS — I700 Atherosclerosis of aorta: Secondary | ICD-10-CM | POA: Diagnosis not present

## 2023-10-22 DIAGNOSIS — M19011 Primary osteoarthritis, right shoulder: Secondary | ICD-10-CM | POA: Diagnosis not present

## 2023-10-22 DIAGNOSIS — I119 Hypertensive heart disease without heart failure: Secondary | ICD-10-CM | POA: Diagnosis not present

## 2023-10-22 DIAGNOSIS — F32A Depression, unspecified: Secondary | ICD-10-CM | POA: Diagnosis not present

## 2023-10-26 DIAGNOSIS — Z96653 Presence of artificial knee joint, bilateral: Secondary | ICD-10-CM | POA: Diagnosis not present

## 2023-10-26 DIAGNOSIS — Z7901 Long term (current) use of anticoagulants: Secondary | ICD-10-CM | POA: Diagnosis not present

## 2023-10-26 DIAGNOSIS — Z96643 Presence of artificial hip joint, bilateral: Secondary | ICD-10-CM | POA: Diagnosis not present

## 2023-10-26 DIAGNOSIS — F419 Anxiety disorder, unspecified: Secondary | ICD-10-CM | POA: Diagnosis not present

## 2023-10-26 DIAGNOSIS — Z853 Personal history of malignant neoplasm of breast: Secondary | ICD-10-CM | POA: Diagnosis not present

## 2023-10-26 DIAGNOSIS — I071 Rheumatic tricuspid insufficiency: Secondary | ICD-10-CM | POA: Diagnosis not present

## 2023-10-26 DIAGNOSIS — M19011 Primary osteoarthritis, right shoulder: Secondary | ICD-10-CM | POA: Diagnosis not present

## 2023-10-26 DIAGNOSIS — F32A Depression, unspecified: Secondary | ICD-10-CM | POA: Diagnosis not present

## 2023-10-26 DIAGNOSIS — I482 Chronic atrial fibrillation, unspecified: Secondary | ICD-10-CM | POA: Diagnosis not present

## 2023-10-26 DIAGNOSIS — G319 Degenerative disease of nervous system, unspecified: Secondary | ICD-10-CM | POA: Diagnosis not present

## 2023-10-26 DIAGNOSIS — I6782 Cerebral ischemia: Secondary | ICD-10-CM | POA: Diagnosis not present

## 2023-10-26 DIAGNOSIS — I69391 Dysphagia following cerebral infarction: Secondary | ICD-10-CM | POA: Diagnosis not present

## 2023-10-26 DIAGNOSIS — I119 Hypertensive heart disease without heart failure: Secondary | ICD-10-CM | POA: Diagnosis not present

## 2023-10-26 DIAGNOSIS — I340 Nonrheumatic mitral (valve) insufficiency: Secondary | ICD-10-CM | POA: Diagnosis not present

## 2023-10-26 DIAGNOSIS — I700 Atherosclerosis of aorta: Secondary | ICD-10-CM | POA: Diagnosis not present

## 2023-10-26 DIAGNOSIS — G9389 Other specified disorders of brain: Secondary | ICD-10-CM | POA: Diagnosis not present

## 2023-10-28 DIAGNOSIS — M19011 Primary osteoarthritis, right shoulder: Secondary | ICD-10-CM | POA: Diagnosis not present

## 2023-10-28 DIAGNOSIS — F32A Depression, unspecified: Secondary | ICD-10-CM | POA: Diagnosis not present

## 2023-10-28 DIAGNOSIS — I700 Atherosclerosis of aorta: Secondary | ICD-10-CM | POA: Diagnosis not present

## 2023-10-28 DIAGNOSIS — I119 Hypertensive heart disease without heart failure: Secondary | ICD-10-CM | POA: Diagnosis not present

## 2023-10-28 DIAGNOSIS — I482 Chronic atrial fibrillation, unspecified: Secondary | ICD-10-CM | POA: Diagnosis not present

## 2023-10-28 DIAGNOSIS — G319 Degenerative disease of nervous system, unspecified: Secondary | ICD-10-CM | POA: Diagnosis not present

## 2023-10-30 DIAGNOSIS — I119 Hypertensive heart disease without heart failure: Secondary | ICD-10-CM | POA: Diagnosis not present

## 2023-10-30 DIAGNOSIS — I482 Chronic atrial fibrillation, unspecified: Secondary | ICD-10-CM | POA: Diagnosis not present

## 2023-10-30 DIAGNOSIS — G319 Degenerative disease of nervous system, unspecified: Secondary | ICD-10-CM | POA: Diagnosis not present

## 2023-10-30 DIAGNOSIS — I700 Atherosclerosis of aorta: Secondary | ICD-10-CM | POA: Diagnosis not present

## 2023-10-30 DIAGNOSIS — M19011 Primary osteoarthritis, right shoulder: Secondary | ICD-10-CM | POA: Diagnosis not present

## 2023-10-30 DIAGNOSIS — F32A Depression, unspecified: Secondary | ICD-10-CM | POA: Diagnosis not present

## 2023-10-31 DIAGNOSIS — M158 Other polyosteoarthritis: Secondary | ICD-10-CM | POA: Diagnosis not present

## 2023-10-31 DIAGNOSIS — F331 Major depressive disorder, recurrent, moderate: Secondary | ICD-10-CM | POA: Diagnosis not present

## 2023-11-01 DIAGNOSIS — M6281 Muscle weakness (generalized): Secondary | ICD-10-CM | POA: Diagnosis not present

## 2023-11-01 DIAGNOSIS — R41841 Cognitive communication deficit: Secondary | ICD-10-CM | POA: Diagnosis not present

## 2023-11-01 DIAGNOSIS — F411 Generalized anxiety disorder: Secondary | ICD-10-CM | POA: Diagnosis not present

## 2023-11-01 DIAGNOSIS — M1612 Unilateral primary osteoarthritis, left hip: Secondary | ICD-10-CM | POA: Diagnosis not present

## 2023-11-04 DIAGNOSIS — I482 Chronic atrial fibrillation, unspecified: Secondary | ICD-10-CM | POA: Diagnosis not present

## 2023-11-04 DIAGNOSIS — I119 Hypertensive heart disease without heart failure: Secondary | ICD-10-CM | POA: Diagnosis not present

## 2023-11-04 DIAGNOSIS — G319 Degenerative disease of nervous system, unspecified: Secondary | ICD-10-CM | POA: Diagnosis not present

## 2023-11-04 DIAGNOSIS — M19011 Primary osteoarthritis, right shoulder: Secondary | ICD-10-CM | POA: Diagnosis not present

## 2023-11-04 DIAGNOSIS — I700 Atherosclerosis of aorta: Secondary | ICD-10-CM | POA: Diagnosis not present

## 2023-11-04 DIAGNOSIS — F32A Depression, unspecified: Secondary | ICD-10-CM | POA: Diagnosis not present

## 2023-11-07 DIAGNOSIS — F411 Generalized anxiety disorder: Secondary | ICD-10-CM | POA: Diagnosis not present

## 2023-11-07 DIAGNOSIS — G894 Chronic pain syndrome: Secondary | ICD-10-CM | POA: Diagnosis not present

## 2023-11-07 DIAGNOSIS — F331 Major depressive disorder, recurrent, moderate: Secondary | ICD-10-CM | POA: Diagnosis not present

## 2023-11-07 DIAGNOSIS — F4323 Adjustment disorder with mixed anxiety and depressed mood: Secondary | ICD-10-CM | POA: Diagnosis not present

## 2023-11-07 DIAGNOSIS — G309 Alzheimer's disease, unspecified: Secondary | ICD-10-CM | POA: Diagnosis not present

## 2023-11-08 DIAGNOSIS — I482 Chronic atrial fibrillation, unspecified: Secondary | ICD-10-CM | POA: Diagnosis not present

## 2023-11-08 DIAGNOSIS — I4891 Unspecified atrial fibrillation: Secondary | ICD-10-CM | POA: Diagnosis not present

## 2023-11-08 DIAGNOSIS — F411 Generalized anxiety disorder: Secondary | ICD-10-CM | POA: Diagnosis not present

## 2023-11-08 DIAGNOSIS — I10 Essential (primary) hypertension: Secondary | ICD-10-CM | POA: Diagnosis not present

## 2023-11-08 DIAGNOSIS — F32A Depression, unspecified: Secondary | ICD-10-CM | POA: Diagnosis not present

## 2023-11-08 DIAGNOSIS — M6281 Muscle weakness (generalized): Secondary | ICD-10-CM | POA: Diagnosis not present

## 2023-11-08 DIAGNOSIS — I119 Hypertensive heart disease without heart failure: Secondary | ICD-10-CM | POA: Diagnosis not present

## 2023-11-08 DIAGNOSIS — I700 Atherosclerosis of aorta: Secondary | ICD-10-CM | POA: Diagnosis not present

## 2023-11-08 DIAGNOSIS — M19011 Primary osteoarthritis, right shoulder: Secondary | ICD-10-CM | POA: Diagnosis not present

## 2023-11-08 DIAGNOSIS — B3789 Other sites of candidiasis: Secondary | ICD-10-CM | POA: Diagnosis not present

## 2023-11-08 DIAGNOSIS — G319 Degenerative disease of nervous system, unspecified: Secondary | ICD-10-CM | POA: Diagnosis not present

## 2023-11-13 DIAGNOSIS — M25552 Pain in left hip: Secondary | ICD-10-CM | POA: Diagnosis not present

## 2023-11-13 DIAGNOSIS — M25551 Pain in right hip: Secondary | ICD-10-CM | POA: Diagnosis not present

## 2023-11-13 DIAGNOSIS — Z96642 Presence of left artificial hip joint: Secondary | ICD-10-CM | POA: Diagnosis not present

## 2023-11-14 DIAGNOSIS — I482 Chronic atrial fibrillation, unspecified: Secondary | ICD-10-CM | POA: Diagnosis not present

## 2023-11-14 DIAGNOSIS — F32A Depression, unspecified: Secondary | ICD-10-CM | POA: Diagnosis not present

## 2023-11-14 DIAGNOSIS — M19011 Primary osteoarthritis, right shoulder: Secondary | ICD-10-CM | POA: Diagnosis not present

## 2023-11-14 DIAGNOSIS — I119 Hypertensive heart disease without heart failure: Secondary | ICD-10-CM | POA: Diagnosis not present

## 2023-11-14 DIAGNOSIS — G319 Degenerative disease of nervous system, unspecified: Secondary | ICD-10-CM | POA: Diagnosis not present

## 2023-11-14 DIAGNOSIS — I700 Atherosclerosis of aorta: Secondary | ICD-10-CM | POA: Diagnosis not present

## 2023-11-20 DIAGNOSIS — M19011 Primary osteoarthritis, right shoulder: Secondary | ICD-10-CM | POA: Diagnosis not present

## 2023-11-20 DIAGNOSIS — F32A Depression, unspecified: Secondary | ICD-10-CM | POA: Diagnosis not present

## 2023-11-20 DIAGNOSIS — I119 Hypertensive heart disease without heart failure: Secondary | ICD-10-CM | POA: Diagnosis not present

## 2023-11-20 DIAGNOSIS — I482 Chronic atrial fibrillation, unspecified: Secondary | ICD-10-CM | POA: Diagnosis not present

## 2023-11-20 DIAGNOSIS — I700 Atherosclerosis of aorta: Secondary | ICD-10-CM | POA: Diagnosis not present

## 2023-11-20 DIAGNOSIS — G319 Degenerative disease of nervous system, unspecified: Secondary | ICD-10-CM | POA: Diagnosis not present

## 2023-11-21 DIAGNOSIS — R54 Age-related physical debility: Secondary | ICD-10-CM | POA: Diagnosis not present

## 2023-11-21 DIAGNOSIS — F411 Generalized anxiety disorder: Secondary | ICD-10-CM | POA: Diagnosis not present

## 2023-11-21 DIAGNOSIS — M6281 Muscle weakness (generalized): Secondary | ICD-10-CM | POA: Diagnosis not present

## 2023-11-21 DIAGNOSIS — M1612 Unilateral primary osteoarthritis, left hip: Secondary | ICD-10-CM | POA: Diagnosis not present

## 2023-11-25 DIAGNOSIS — I482 Chronic atrial fibrillation, unspecified: Secondary | ICD-10-CM | POA: Diagnosis not present

## 2023-11-25 DIAGNOSIS — F32A Depression, unspecified: Secondary | ICD-10-CM | POA: Diagnosis not present

## 2023-11-25 DIAGNOSIS — I6782 Cerebral ischemia: Secondary | ICD-10-CM | POA: Diagnosis not present

## 2023-11-25 DIAGNOSIS — F419 Anxiety disorder, unspecified: Secondary | ICD-10-CM | POA: Diagnosis not present

## 2023-11-25 DIAGNOSIS — I071 Rheumatic tricuspid insufficiency: Secondary | ICD-10-CM | POA: Diagnosis not present

## 2023-11-25 DIAGNOSIS — Z7901 Long term (current) use of anticoagulants: Secondary | ICD-10-CM | POA: Diagnosis not present

## 2023-11-25 DIAGNOSIS — I340 Nonrheumatic mitral (valve) insufficiency: Secondary | ICD-10-CM | POA: Diagnosis not present

## 2023-11-25 DIAGNOSIS — G319 Degenerative disease of nervous system, unspecified: Secondary | ICD-10-CM | POA: Diagnosis not present

## 2023-11-25 DIAGNOSIS — Z853 Personal history of malignant neoplasm of breast: Secondary | ICD-10-CM | POA: Diagnosis not present

## 2023-11-25 DIAGNOSIS — I700 Atherosclerosis of aorta: Secondary | ICD-10-CM | POA: Diagnosis not present

## 2023-11-25 DIAGNOSIS — G9389 Other specified disorders of brain: Secondary | ICD-10-CM | POA: Diagnosis not present

## 2023-11-25 DIAGNOSIS — I119 Hypertensive heart disease without heart failure: Secondary | ICD-10-CM | POA: Diagnosis not present

## 2023-11-25 DIAGNOSIS — I69391 Dysphagia following cerebral infarction: Secondary | ICD-10-CM | POA: Diagnosis not present

## 2023-11-25 DIAGNOSIS — M19011 Primary osteoarthritis, right shoulder: Secondary | ICD-10-CM | POA: Diagnosis not present

## 2023-11-25 DIAGNOSIS — Z96653 Presence of artificial knee joint, bilateral: Secondary | ICD-10-CM | POA: Diagnosis not present

## 2023-11-25 DIAGNOSIS — Z96643 Presence of artificial hip joint, bilateral: Secondary | ICD-10-CM | POA: Diagnosis not present

## 2023-11-28 ENCOUNTER — Telehealth: Payer: Self-pay | Admitting: Internal Medicine

## 2023-11-28 NOTE — Telephone Encounter (Signed)
 Caller [facility]) wants to get patient's orders for physical therapy extended once a week for 3 more weeks.

## 2023-11-28 NOTE — Telephone Encounter (Signed)
 Returned a call back to [place] with [facility].   Sharri Dee is calling to get a verbal order from Dr. Lorie Rook to extend the pts PT for once a week for 3 more weeks.   Asked Monique if PT order originally came from the pts Neurologist, and she confirmed with me that Dr. Lorie Rook has been overseeing these orders.   Informed Monique that I will route this message to Dr. Lorie Rook and his covering RN for further advisement and follow-up.  Monique verbalized understanding and agrees with this plan.

## 2023-11-29 DIAGNOSIS — G2581 Restless legs syndrome: Secondary | ICD-10-CM | POA: Diagnosis not present

## 2023-11-29 DIAGNOSIS — M6281 Muscle weakness (generalized): Secondary | ICD-10-CM | POA: Diagnosis not present

## 2023-11-29 DIAGNOSIS — Z7901 Long term (current) use of anticoagulants: Secondary | ICD-10-CM | POA: Diagnosis not present

## 2023-11-29 DIAGNOSIS — I10 Essential (primary) hypertension: Secondary | ICD-10-CM | POA: Diagnosis not present

## 2023-11-29 DIAGNOSIS — I4891 Unspecified atrial fibrillation: Secondary | ICD-10-CM | POA: Diagnosis not present

## 2023-11-29 NOTE — Telephone Encounter (Signed)
 Left detailed message on Monique's VM that to my knowledge cardiology is not managing patient's home care services.  Adv that she follow up with PCP or neurology for orders.  Also adv to call me back if she has or needs further information.

## 2023-12-04 DIAGNOSIS — M158 Other polyosteoarthritis: Secondary | ICD-10-CM | POA: Diagnosis not present

## 2023-12-04 DIAGNOSIS — F331 Major depressive disorder, recurrent, moderate: Secondary | ICD-10-CM | POA: Diagnosis not present

## 2023-12-06 DIAGNOSIS — F32A Depression, unspecified: Secondary | ICD-10-CM | POA: Diagnosis not present

## 2023-12-06 DIAGNOSIS — I482 Chronic atrial fibrillation, unspecified: Secondary | ICD-10-CM | POA: Diagnosis not present

## 2023-12-06 DIAGNOSIS — I119 Hypertensive heart disease without heart failure: Secondary | ICD-10-CM | POA: Diagnosis not present

## 2023-12-06 DIAGNOSIS — G319 Degenerative disease of nervous system, unspecified: Secondary | ICD-10-CM | POA: Diagnosis not present

## 2023-12-06 DIAGNOSIS — I700 Atherosclerosis of aorta: Secondary | ICD-10-CM | POA: Diagnosis not present

## 2023-12-06 DIAGNOSIS — M19011 Primary osteoarthritis, right shoulder: Secondary | ICD-10-CM | POA: Diagnosis not present

## 2023-12-12 NOTE — Telephone Encounter (Signed)
 Received plan of care for patient.  Called the home health agency and adv that Dr. Lorie Rook does not manage the patient's home care.

## 2023-12-13 DIAGNOSIS — F419 Anxiety disorder, unspecified: Secondary | ICD-10-CM | POA: Diagnosis not present

## 2023-12-13 DIAGNOSIS — M6281 Muscle weakness (generalized): Secondary | ICD-10-CM | POA: Diagnosis not present

## 2023-12-13 DIAGNOSIS — F411 Generalized anxiety disorder: Secondary | ICD-10-CM | POA: Diagnosis not present

## 2023-12-13 DIAGNOSIS — I4891 Unspecified atrial fibrillation: Secondary | ICD-10-CM | POA: Diagnosis not present

## 2023-12-13 DIAGNOSIS — F32A Depression, unspecified: Secondary | ICD-10-CM | POA: Diagnosis not present

## 2023-12-13 DIAGNOSIS — I10 Essential (primary) hypertension: Secondary | ICD-10-CM | POA: Diagnosis not present

## 2023-12-13 DIAGNOSIS — I119 Hypertensive heart disease without heart failure: Secondary | ICD-10-CM | POA: Diagnosis not present

## 2023-12-13 DIAGNOSIS — G2581 Restless legs syndrome: Secondary | ICD-10-CM | POA: Diagnosis not present

## 2023-12-13 DIAGNOSIS — M19011 Primary osteoarthritis, right shoulder: Secondary | ICD-10-CM | POA: Diagnosis not present

## 2023-12-13 DIAGNOSIS — I482 Chronic atrial fibrillation, unspecified: Secondary | ICD-10-CM | POA: Diagnosis not present

## 2023-12-13 DIAGNOSIS — R41841 Cognitive communication deficit: Secondary | ICD-10-CM | POA: Diagnosis not present

## 2023-12-13 DIAGNOSIS — I700 Atherosclerosis of aorta: Secondary | ICD-10-CM | POA: Diagnosis not present

## 2023-12-13 DIAGNOSIS — G319 Degenerative disease of nervous system, unspecified: Secondary | ICD-10-CM | POA: Diagnosis not present

## 2023-12-18 DIAGNOSIS — I482 Chronic atrial fibrillation, unspecified: Secondary | ICD-10-CM | POA: Diagnosis not present

## 2023-12-18 DIAGNOSIS — I119 Hypertensive heart disease without heart failure: Secondary | ICD-10-CM | POA: Diagnosis not present

## 2023-12-18 DIAGNOSIS — G319 Degenerative disease of nervous system, unspecified: Secondary | ICD-10-CM | POA: Diagnosis not present

## 2023-12-18 DIAGNOSIS — I700 Atherosclerosis of aorta: Secondary | ICD-10-CM | POA: Diagnosis not present

## 2023-12-18 DIAGNOSIS — F32A Depression, unspecified: Secondary | ICD-10-CM | POA: Diagnosis not present

## 2023-12-18 DIAGNOSIS — M19011 Primary osteoarthritis, right shoulder: Secondary | ICD-10-CM | POA: Diagnosis not present

## 2024-01-21 DIAGNOSIS — K623 Rectal prolapse: Secondary | ICD-10-CM | POA: Diagnosis not present

## 2024-01-21 DIAGNOSIS — M159 Polyosteoarthritis, unspecified: Secondary | ICD-10-CM | POA: Diagnosis not present

## 2024-01-21 DIAGNOSIS — R634 Abnormal weight loss: Secondary | ICD-10-CM | POA: Diagnosis not present

## 2024-01-21 DIAGNOSIS — E559 Vitamin D deficiency, unspecified: Secondary | ICD-10-CM | POA: Diagnosis not present

## 2024-01-21 DIAGNOSIS — F411 Generalized anxiety disorder: Secondary | ICD-10-CM | POA: Diagnosis not present

## 2024-01-21 DIAGNOSIS — G2581 Restless legs syndrome: Secondary | ICD-10-CM | POA: Diagnosis not present

## 2024-01-21 DIAGNOSIS — I10 Essential (primary) hypertension: Secondary | ICD-10-CM | POA: Diagnosis not present

## 2024-01-21 DIAGNOSIS — E7800 Pure hypercholesterolemia, unspecified: Secondary | ICD-10-CM | POA: Diagnosis not present

## 2024-01-21 DIAGNOSIS — F321 Major depressive disorder, single episode, moderate: Secondary | ICD-10-CM | POA: Diagnosis not present

## 2024-01-21 DIAGNOSIS — T7840XA Allergy, unspecified, initial encounter: Secondary | ICD-10-CM | POA: Diagnosis not present

## 2024-02-04 ENCOUNTER — Encounter: Payer: Self-pay | Admitting: Podiatry

## 2024-02-04 ENCOUNTER — Ambulatory Visit (INDEPENDENT_AMBULATORY_CARE_PROVIDER_SITE_OTHER): Admitting: Podiatry

## 2024-02-04 DIAGNOSIS — B351 Tinea unguium: Secondary | ICD-10-CM

## 2024-02-04 DIAGNOSIS — H401133 Primary open-angle glaucoma, bilateral, severe stage: Secondary | ICD-10-CM | POA: Diagnosis not present

## 2024-02-04 DIAGNOSIS — H5213 Myopia, bilateral: Secondary | ICD-10-CM | POA: Diagnosis not present

## 2024-02-04 DIAGNOSIS — M79672 Pain in left foot: Secondary | ICD-10-CM | POA: Diagnosis not present

## 2024-02-04 DIAGNOSIS — Z961 Presence of intraocular lens: Secondary | ICD-10-CM | POA: Diagnosis not present

## 2024-02-04 DIAGNOSIS — M79671 Pain in right foot: Secondary | ICD-10-CM | POA: Diagnosis not present

## 2024-02-04 NOTE — Progress Notes (Signed)
 Patient presents for evaluation and treatment of tenderness and some redness around nails feet.  Tenderness around toes with walking and wearing shoes.  Physical exam:  General appearance: Alert, pleasant, and in no acute distress.  Vascular: Pedal pulses: DP 1/4 B/L, PT 1/4 B/L. Mild edema lower legs bilaterally  Neurological:    Dermatologic:  Nails thickened, disfigured, discolored 1-5 BL with subungual debris.  Redness and hypertrophic nail folds along nail folds bilaterally but no signs of drainage or infection.  Musculoskeletal:     Diagnosis: 1. Painful onychomycotic nails 1 through 5 bilaterally. 2. Pain toes 1 through 5 bilaterally.  Plan: Debrided onychomycotic nails 1 through 5 bilaterally.  Return 3 months RFC

## 2024-03-03 DIAGNOSIS — F321 Major depressive disorder, single episode, moderate: Secondary | ICD-10-CM | POA: Diagnosis not present

## 2024-03-03 DIAGNOSIS — G2581 Restless legs syndrome: Secondary | ICD-10-CM | POA: Diagnosis not present

## 2024-03-03 DIAGNOSIS — F411 Generalized anxiety disorder: Secondary | ICD-10-CM | POA: Diagnosis not present

## 2024-03-30 DIAGNOSIS — M25552 Pain in left hip: Secondary | ICD-10-CM | POA: Diagnosis not present

## 2024-04-15 ENCOUNTER — Other Ambulatory Visit: Payer: Self-pay | Admitting: Family Medicine

## 2024-04-15 DIAGNOSIS — Z1231 Encounter for screening mammogram for malignant neoplasm of breast: Secondary | ICD-10-CM

## 2024-04-25 DIAGNOSIS — Z23 Encounter for immunization: Secondary | ICD-10-CM | POA: Diagnosis not present

## 2024-04-28 DIAGNOSIS — M2012 Hallux valgus (acquired), left foot: Secondary | ICD-10-CM | POA: Diagnosis not present

## 2024-04-28 DIAGNOSIS — M79675 Pain in left toe(s): Secondary | ICD-10-CM | POA: Diagnosis not present

## 2024-04-28 DIAGNOSIS — B351 Tinea unguium: Secondary | ICD-10-CM | POA: Diagnosis not present

## 2024-04-28 DIAGNOSIS — Z7901 Long term (current) use of anticoagulants: Secondary | ICD-10-CM | POA: Diagnosis not present

## 2024-04-29 ENCOUNTER — Ambulatory Visit
Admission: RE | Admit: 2024-04-29 | Discharge: 2024-04-29 | Disposition: A | Discharge status: Home / Self Care | Source: Ambulatory Visit | Attending: Family Medicine | Admitting: Family Medicine

## 2024-04-29 DIAGNOSIS — Z1231 Encounter for screening mammogram for malignant neoplasm of breast: Secondary | ICD-10-CM

## 2024-05-06 ENCOUNTER — Ambulatory Visit: Admitting: Podiatry

## 2024-05-19 DIAGNOSIS — I10 Essential (primary) hypertension: Secondary | ICD-10-CM | POA: Diagnosis not present

## 2024-05-19 DIAGNOSIS — Z8673 Personal history of transient ischemic attack (TIA), and cerebral infarction without residual deficits: Secondary | ICD-10-CM | POA: Diagnosis not present

## 2024-05-19 DIAGNOSIS — Z0000 Encounter for general adult medical examination without abnormal findings: Secondary | ICD-10-CM | POA: Diagnosis not present

## 2024-05-19 DIAGNOSIS — Q211 Atrial septal defect, unspecified: Secondary | ICD-10-CM | POA: Diagnosis not present

## 2024-05-19 DIAGNOSIS — Z853 Personal history of malignant neoplasm of breast: Secondary | ICD-10-CM | POA: Diagnosis not present

## 2024-05-19 DIAGNOSIS — G2581 Restless legs syndrome: Secondary | ICD-10-CM | POA: Diagnosis not present

## 2024-05-19 DIAGNOSIS — R54 Age-related physical debility: Secondary | ICD-10-CM | POA: Diagnosis not present

## 2024-05-19 DIAGNOSIS — E559 Vitamin D deficiency, unspecified: Secondary | ICD-10-CM | POA: Diagnosis not present

## 2024-05-19 DIAGNOSIS — R0981 Nasal congestion: Secondary | ICD-10-CM | POA: Diagnosis not present

## 2024-05-19 DIAGNOSIS — F411 Generalized anxiety disorder: Secondary | ICD-10-CM | POA: Diagnosis not present

## 2024-05-19 DIAGNOSIS — Z03818 Encounter for observation for suspected exposure to other biological agents ruled out: Secondary | ICD-10-CM | POA: Diagnosis not present

## 2024-05-19 DIAGNOSIS — M545 Low back pain, unspecified: Secondary | ICD-10-CM | POA: Diagnosis not present

## 2024-05-19 DIAGNOSIS — E7800 Pure hypercholesterolemia, unspecified: Secondary | ICD-10-CM | POA: Diagnosis not present

## 2024-05-19 DIAGNOSIS — F321 Major depressive disorder, single episode, moderate: Secondary | ICD-10-CM | POA: Diagnosis not present

## 2024-06-19 DIAGNOSIS — M5459 Other low back pain: Secondary | ICD-10-CM | POA: Diagnosis not present

## 2024-07-20 ENCOUNTER — Telehealth (HOSPITAL_BASED_OUTPATIENT_CLINIC_OR_DEPARTMENT_OTHER): Payer: Self-pay

## 2024-07-20 DIAGNOSIS — I480 Paroxysmal atrial fibrillation: Secondary | ICD-10-CM

## 2024-07-20 DIAGNOSIS — I639 Cerebral infarction, unspecified: Secondary | ICD-10-CM

## 2024-07-20 NOTE — Telephone Encounter (Signed)
 Pharmacy please advise on holding Eliquis  prior to L5-S1 interlaminar epidural steroid injection  scheduled for TBD. Thank you.

## 2024-07-20 NOTE — Telephone Encounter (Signed)
 Spoke to the patient's son, (on [place]) he informed me that his mother was diagnosed with Covid this morning and he was on his way to pick her up medicine. The patient currently lives in Assisted Living and is very hard of hearing. I told her son we would give them a call back in a week and check in and see if she will be up to us  starting the clearance process. He said that would be fine.

## 2024-07-20 NOTE — Telephone Encounter (Signed)
"  ° °  Pre-operative Risk Assessment    Patient Name: Renee Adams  DOB: 1933/07/27 MRN: [id number]   Date of last office visit: 10/03/2023 with Dr. Wendel Date of next office visit: None  Request for Surgical Clearance    Procedure:  L5-S1 interlaminar epidural steroid injection  Date of Surgery:  Clearance TBD                                 Surgeon:  Dr. Laqueta Socks Group or Practice Name:  [facility] Phone number:  [phone] Fax number:  [phone]   Type of Clearance Requested:   - Medical  - Pharmacy:  Hold Apixaban  (Eliquis ) 3 days prior   Type of Anesthesia:  does not specify   Additional requests/questions:  None  Bonney Patrcia Iverson LITTIE   07/20/2024, 2:47 PM   "

## 2024-07-20 NOTE — Telephone Encounter (Signed)
" ° °  Name: Renee Adams  DOB: 02/05/1934  MRN: [id number]  Primary Cardiologist: Arun K Thukkani, MD   Preoperative team, please contact this patient and set up a phone call appointment for further preoperative risk assessment. Please obtain consent and complete medication review. Thank you for your help.  I confirm that guidance regarding antiplatelet and oral anticoagulation therapy has been completed and, if necessary, noted below.  Recommendations for holding Eliquis  have been requested.  I also confirmed the patient resides in the state of [place] . As per [facility] Medical Board telemedicine laws, the patient must reside in the state in which the provider is licensed.   Lamarr Satterfield, NP 07/20/2024, 2:53 PM [facility]    "

## 2024-07-21 DIAGNOSIS — M720 Palmar fascial fibromatosis [Dupuytren]: Secondary | ICD-10-CM | POA: Insufficient documentation

## 2024-07-21 DIAGNOSIS — G629 Polyneuropathy, unspecified: Secondary | ICD-10-CM | POA: Insufficient documentation

## 2024-07-21 DIAGNOSIS — M1990 Unspecified osteoarthritis, unspecified site: Secondary | ICD-10-CM | POA: Insufficient documentation

## 2024-07-21 DIAGNOSIS — G2581 Restless legs syndrome: Secondary | ICD-10-CM | POA: Insufficient documentation

## 2024-07-21 DIAGNOSIS — M2550 Pain in unspecified joint: Secondary | ICD-10-CM | POA: Insufficient documentation

## 2024-07-21 NOTE — Telephone Encounter (Signed)
 Per Dr Wendel, patient will need Lovenox  bridge while holding Eliquis 

## 2024-07-21 NOTE — Telephone Encounter (Signed)
 Patient with diagnosis of A Fib on Eliquis  for anticoagulation.    Procedure: L5-S1 interlaminar epidural steroid injection  Date of procedure: TBD   CHA2DS2-VASc Score = 7  This indicates a 11.2% annual risk of stroke. The patient's score is based upon: CHF History: 0 HTN History: 1 Diabetes History: 0 Stroke History: 2 Vascular Disease History: 1 Age Score: 2 Gender Score: 1    CrCl 47 ml/min Platelet count 343k  Patient has not had an Afib/aflutter ablation in the last 3 months, DCCV within the last 4 weeks or a watchman implanted in the last 45 days   Patient will need to hold Eliquis  for 3 days prior to procedure. This will need approval from Dr Wendel due to elevated risk score and history of CVA  **This guidance is not considered finalized until pre-operative APP has relayed final recommendations.**

## 2024-07-22 NOTE — Telephone Encounter (Addendum)
 Tried to call the pt to set up tele preop appt. Call was answered but then disconnected.

## 2024-07-24 NOTE — Telephone Encounter (Signed)
 Called patient daughter about patient pre-op clearance . Daughter mention that she will call patient due that she lives in an assistant living. To see when patient is available.

## 2024-07-27 NOTE — Telephone Encounter (Signed)
  Daughter is returning call 

## 2024-07-28 ENCOUNTER — Telehealth: Payer: Self-pay

## 2024-07-28 NOTE — Telephone Encounter (Signed)
 Appointment is scheduled for 07/31/24. Med req and consent are complete. Call patient is a an ALF, please call her at [phone].  Spoke with daughter, [name] and she stated that patient gets easily confused and if you prefer to her at [phone] and she has POA. Just FYI

## 2024-07-28 NOTE — Telephone Encounter (Signed)
"  °  Patient Consent for Virtual Visit         Renee Adams has provided verbal consent on 07/28/2024 for a virtual visit (video or telephone).  Appointment is scheduled for 07/31/24. Med req and consent are complete. Call patient is a an ALF (assisted living facility), please call her at [phone].  Spoke with daughter, Renee Adams and she stated that patient gets easily confused and if you prefer to her at [phone] and she has POA. FYI   CONSENT FOR VIRTUAL VISIT FOR:  Renee Adams  By participating in this virtual visit I agree to the following:  I hereby voluntarily request, consent and authorize [facility] and its employed or contracted physicians, physician assistants, nurse practitioners or other licensed health care professionals (the Practitioner), to provide me with telemedicine health care services (the Services) as deemed necessary by the treating Practitioner. I acknowledge and consent to receive the Services by the Practitioner via telemedicine. I understand that the telemedicine visit will involve communicating with the Practitioner through live audiovisual communication technology and the disclosure of certain medical information by electronic transmission. I acknowledge that I have been given the opportunity to request an in-person assessment or other available alternative prior to the telemedicine visit and am voluntarily participating in the telemedicine visit.  I understand that I have the right to withhold or withdraw my consent to the use of telemedicine in the course of my care at any time, without affecting my right to future care or treatment, and that the Practitioner or I may terminate the telemedicine visit at any time. I understand that I have the right to inspect all information obtained and/or recorded in the course of the telemedicine visit and may receive copies of available information for a reasonable fee.  I understand that some of the  potential risks of receiving the Services via telemedicine include:  Delay or interruption in medical evaluation due to technological equipment failure or disruption; Information transmitted may not be sufficient (e.g. poor resolution of images) to allow for appropriate medical decision making by the Practitioner; and/or  In rare instances, security protocols could fail, causing a breach of personal health information.  Furthermore, I acknowledge that it is my responsibility to provide information about my medical history, conditions and care that is complete and accurate to the best of my ability. I acknowledge that Practitioner's advice, recommendations, and/or decision may be based on factors not within their control, such as incomplete or inaccurate data provided by me or distortions of diagnostic images or specimens that may result from electronic transmissions. I understand that the practice of medicine is not an exact science and that Practitioner makes no warranties or guarantees regarding treatment outcomes. I acknowledge that a copy of this consent can be made available to me via my patient portal [facility] MyChart), or I can request a printed copy by calling the office of [facility].    I understand that my insurance will be billed for this visit.   I have read or had this consent read to me. I understand the contents of this consent, which adequately explains the benefits and risks of the Services being provided via telemedicine.  I have been provided ample opportunity to ask questions regarding this consent and the Services and have had my questions answered to my satisfaction. I give my informed consent for the services to be provided through the use of telemedicine in my medical care    "

## 2024-07-31 ENCOUNTER — Ambulatory Visit: Attending: Cardiovascular Disease | Admitting: Student

## 2024-07-31 DIAGNOSIS — Z01810 Encounter for preprocedural cardiovascular examination: Secondary | ICD-10-CM

## 2024-07-31 NOTE — Progress Notes (Signed)
 Preop CMA will await for the pt's daughter to call back with procedure date in order to align Lovenox  bridge with pharm-d

## 2024-07-31 NOTE — Progress Notes (Signed)
 "   Virtual Visit via Telephone Note   Because of Renee Adams's co-morbid illnesses, she is at least at moderate risk for complications without adequate follow up.  This format is felt to be most appropriate for this patient at this time.  The patient did not have access to video technology/had technical difficulties with video requiring transitioning to audio format only (telephone).  All issues noted in this document were discussed and addressed.  No physical exam could be performed with this format.  Please refer to the patient's chart for her consent to telehealth for [facility].  Evaluation Performed:  Preoperative cardiovascular risk assessment _____________   Date:  07/31/2024   Patient ID:  Renee Adams, DOB 03-Jun-1934, MRN [id number] Patient Location:  Home Provider location:   Office  Primary Care Provider:  Leonel Cole, MD  Primary Cardiologist:  [facility] Providers Cardiologist:  Arun K Thukkani, MD    Chief Complaint / Patient Profile   89 y.o. y/o female with a h/o aortic atherosclerosis, hyperlipidemia, CVA who is pending L5-S1 interlaminar epidural steroid injection by Dr. Laqueta and presents today for telephonic preoperative cardiovascular risk assessment.  History of Present Illness    Renee Adams is a 89 y.o. female who presents via audio/video conferencing for a telehealth visit today.  Pt was last seen in cardiology clinic on 10/03/2023 by Dr. Wendel.  At that time Renee Adams was stable from a cardiac standpoint.  The patient is now pending procedure as outlined above. Since her last visit, she is doing well. Patient denies lower extremity edema, orthopnea or PND. She has chronic DOE that is unchanged from previous. No chest pain, pressure, or tightness. No palpitations.  Her activity is limited by back pain and arthritis. She walks the hallways in the nursing home daily.   Past Medical History    Past Medical History:   Diagnosis Date   ASD (atrial septal defect)    Cancer (HCC)    breast, left   Carpal tunnel syndrome, bilateral    Depression    Gait disorder    GERD (gastroesophageal reflux disease)    Glaucoma    Glaucoma    History of stroke    Hypertension    IBS (irritable bowel syndrome)    Personal history of radiation therapy 1996   S/P bunionectomy    bilateral   Past Surgical History:  Procedure Laterality Date   ABDOMINAL HYSTERECTOMY     bladder resuspension     BREAST BIOPSY Left    BREAST BIOPSY Right    BREAST LUMPECTOMY Left 1996   CATARACT EXTRACTION Bilateral    INCISION AND DRAINAGE HIP  03/31/2012   Procedure: IRRIGATION AND DEBRIDEMENT HIP WITH POLY EXCHANGE;  Surgeon: Garnette JONETTA Raman, MD;  Location: [facility] OR;  Service: Orthopedics;  Laterality: Left;   ORIF FEMUR FRACTURE Right 11/30/2012   Procedure: OPEN REDUCTION INTERNAL FIXATION (ORIF) DISTAL FEMUR FRACTURE;  Surgeon: Marcey Raman, MD;  Location: [facility] OR;  Service: Orthopedics;  Laterality: Right;   TOTAL HIP ARTHROPLASTY Bilateral    TOTAL KNEE ARTHROPLASTY Bilateral     Allergies  Allergies[1]  Home Medications    Prior to Admission medications  Medication Sig Start Date End Date Taking? Authorizing Provider  acetaminophen  (TYLENOL ) 500 MG tablet Take 1 tablet (500 mg total) by mouth every 8 (eight) hours as needed. 02/07/21   Magrinat, Sandria BROCKS, MD  albuterol  (VENTOLIN  HFA) 108 [phone] Base) MCG/ACT inhaler Inhale 2  puffs into the lungs every 6 (six) hours as needed for wheezing or shortness of breath. 04/08/23   [place], Camellia PARAS, DO  ALPRAZolam  (XANAX ) 0.25 MG tablet Take 1 tablet (0.25 mg total) by mouth 2 (two) times daily as needed for anxiety. 07/24/23   Drusilla Sabas RAMAN, MD  apixaban  (ELIQUIS ) 2.5 MG TABS tablet Take 1 tablet (2.5 mg total) by mouth 2 (two) times daily. 10/03/23   Thukkani, Arun K, MD  fluticasone  (FLONASE ) 50 MCG/ACT nasal spray Place 2 sprays into both nostrils daily. Patient taking differently:  Place 2 sprays into both nostrils as needed. 04/09/23   [place], Eric J, DO  ketoconazole  (NIZORAL ) 2 % cream APPLY TO AFFECTED AREA ON THE SKIN DAILY AS NEEDED FOR IRRITATION Patient taking differently: Apply 1 Application topically once as needed. 02/25/23   Iruku, Praveena, MD  loratadine  (CLARITIN ) 10 MG tablet Take 1 tablet (10 mg total) by mouth daily as needed for allergies or rhinitis. 04/08/23   [place], Eric J, DO  metroNIDAZOLE (METROGEL) 0.75 % vaginal gel Place 1 Applicatorful vaginally See admin instructions. Patient stated she uses it twice a week. 07/20/2023.    [provider]  Multiple Vitamin (MULTIVITAMIN WITH MINERALS) TABS tablet Take 1 tablet by mouth daily.    [provider]  nystatin cream (MYCOSTATIN) Apply 1 Application topically daily. 09/23/23   [provider]  rOPINIRole  (REQUIP ) 1 MG tablet Take 1 tablet (1 mg total) by mouth at bedtime as needed. For restless legs Patient taking differently: Take 1 mg by mouth at bedtime as needed (restless legs). 12/17/12   Angiulli, Toribio PARAS, PA-C  sertraline  (ZOLOFT ) 100 MG tablet Take 1.5 tablets (150 mg total) by mouth daily. 12/17/12   Angiulli, Toribio PARAS, PA-C  traMADol  (ULTRAM ) 50 MG tablet Take 1 tablet (50 mg total) by mouth every 12 (twelve) hours as needed for moderate pain (pain score 4-6). 07/24/23   Drusilla Sabas RAMAN, MD  VITAMIN D PO Take 1 tablet by mouth daily.    [provider]  pramipexole (MIRAPEX) 0.125 MG tablet Take 0.125 mg by mouth 3 (three) times daily.  09/15/11  [provider]    Physical Exam    Vital Signs:  Renee Adams does not have vital signs available for review today.  Given telephonic nature of communication, physical exam is limited. AAOx3. NAD. Normal affect.  Speech and respirations are unlabored.   Assessment & Plan    Preoperative cardiovascular risk assessment. L5-S1 interlaminar epidural steroid injection by Dr. Laqueta  Chart reviewed as part  of pre-operative protocol coverage. According to the RCRI, patient has a 0.9% risk of MACE. Patient reports activity equivalent to 4.0 METS (walks the hallways at the nursing home).   Given past medical history and time since last visit, based on ACC/AHA guidelines, Renee Adams would be at acceptable risk for the planned procedure without further cardiovascular testing.   Patient was advised that if she develops new symptoms prior to surgery to contact our office to arrange a follow-up appointment.  she verbalized understanding.  Per Pharm D, patient has not had an Afib/aflutter ablation within the last 90 days, DCCV within the last 4 weeks, or Watchman in the last 45 days. Patient may hold Eliquis  for 3 days prior to procedure.  Per Dr. Wendel, patient will need bridging with Lovenox  around procedure.   **Of note: Spoke with [name], patient's daughter, to go over information for holding Eliquis . She will contact the office  and speak with the pre-op CMA when a date is set for the injection. The CMA will pass the information to the pharm D who will coordinate the bridge. Renee voiced understanding of the plan.**  I will route this recommendation to the requesting party via Epic fax function.  Please call with questions.  Time:   Today, I have spent 12 minutes with the patient with telehealth technology discussing medical history, symptoms, and management plan.     Barnie Hila, NP  07/31/2024, 11:15 AM     [1]  Allergies Allergen Reactions   Dilaudid  [Hydromorphone  Hcl] Shortness Of Breath    Tolerates tramadol    Hydromorphone  Other (See Comments)    UNKNOWN TO PATIENT   Morphine  Other (See Comments)    UNKNOWN TO PATIENT   Morphine  And Codeine Shortness Of Breath    Tolerates tramadol    Sulfamethoxazole Other (See Comments)    UNKNOWN TO PATIENT   Sulfa Antibiotics Other (See Comments)    Made me drunk;  wobbly   Keflex  [Cephalexin ] Nausea Only   Levaquin   [Levofloxacin ] Hives and Rash   "

## 2024-08-04 NOTE — Telephone Encounter (Signed)
 Incoming call received from daughter. She is calling to inquire about the bridge of Louvenox and when she will need to hold Eliquis . She stated that the epidural steroid injection is scheduled for Wednesday 08/12/24. She says that the bridge will need to be sent to the skilled nursing facility [facility]) or the local pharmacy depending on timing. If its to be sent directly to the facility she requests that it be sent to [facility] as they're the ones that then ship/send it to [facility] but the processing may take a few days. If its needed sooner she would like for it to be sent to [facility]. Please inform daughter of where it will be sent to or best recommended prior to sending the prescription over.  Daughter also stated that the facility is requesting a order/note indicating when and how long the patient needs to hold Eliquis . Please advise  Skilled Nursing Facility [facility] Fax Number: [phone]

## 2024-08-04 NOTE — Telephone Encounter (Signed)
Patient daughter is requesting a call back. 

## 2024-08-05 ENCOUNTER — Encounter: Payer: Self-pay | Admitting: Pharmacist

## 2024-08-05 ENCOUNTER — Telehealth: Payer: Self-pay | Admitting: Pharmacist

## 2024-08-05 MED ORDER — ENOXAPARIN SODIUM 80 MG/0.8ML IJ SOSY: 80.0000 mg | PREFILLED_SYRINGE | INTRAMUSCULAR | 0 refills | Status: AC

## 2024-08-05 NOTE — Telephone Encounter (Signed)
 Pharmacy team, please see note below regarding questions about Lovenox  bridge.  Thank you!

## 2024-08-05 NOTE — Telephone Encounter (Signed)
 Created letter for assisted living and electronically faxed.

## 2024-08-05 NOTE — Telephone Encounter (Signed)
 Spoke with patient's daughter Renee Adams. Will hold Eliquis  for 3 days before procedure on 2/4. Hold Eliquis  on 2/1, 2/2, and 2/3.  Once daily Lovenox  injections to be administered on 2/1, 2/2, and 2/3. Rx sent to daughter's pharmacy per daughters request.  Patient lives at an assisted living facility which is staffed by a nurse names Wyatt Lunger. Patient's daughter will verify that nurse can administer injections/. Requests an order be faxed to patient's assisted living facility with instructions. Fax # [phone]

## 2024-08-05 NOTE — Addendum Note (Signed)
 Addended by: DARRELL BAREFOOT on: 08/05/2024 04:09 PM   Modules accepted: Orders

## 2024-08-06 NOTE — Telephone Encounter (Signed)
 See notes in chart from Barnie Hila, NP for preop clearance.

## 2024-08-07 ENCOUNTER — Encounter: Payer: Self-pay | Admitting: Pharmacist

## 2024-08-07 NOTE — Telephone Encounter (Signed)
 Patient's daughter Andrea ([place]) and she stated since the facility is having some staffing issues and with the upcoming inclement weather they are going to postpone the steroid injection procedure. Advised to continue with eliquis  as normal since no interruption. She will contact the clinic and ask for [facility] when it is rescheduled. Advised I would update [place] as well.

## 2024-08-07 NOTE — Telephone Encounter (Signed)
 Pts daughter andrea requesting a form to be faxed to [phone] the pt living facility stating her injection date was postponed and she's able to continue her medication.

## 2024-10-22 ENCOUNTER — Ambulatory Visit: Admitting: Physician Assistant
